# Patient Record
Sex: Male | Born: 1935
Health system: Southern US, Community
[De-identification: ages and names within clinical notes are randomized; demographics above are authoritative.]

## PROBLEM LIST (undated history)

## (undated) DIAGNOSIS — Z8669 Personal history of other diseases of the nervous system and sense organs: Secondary | ICD-10-CM

## (undated) DIAGNOSIS — N400 Enlarged prostate without lower urinary tract symptoms: Secondary | ICD-10-CM

## (undated) DIAGNOSIS — M4310 Spondylolisthesis, site unspecified: Secondary | ICD-10-CM

## (undated) DIAGNOSIS — N529 Male erectile dysfunction, unspecified: Secondary | ICD-10-CM

## (undated) DIAGNOSIS — E039 Hypothyroidism, unspecified: Secondary | ICD-10-CM

## (undated) DIAGNOSIS — I5022 Chronic systolic (congestive) heart failure: Secondary | ICD-10-CM

## (undated) DIAGNOSIS — I712 Thoracic aortic aneurysm, without rupture: Principal | ICD-10-CM

## (undated) DIAGNOSIS — I4819 Other persistent atrial fibrillation: Secondary | ICD-10-CM

## (undated) DIAGNOSIS — I10 Essential (primary) hypertension: Secondary | ICD-10-CM

## (undated) DIAGNOSIS — G4730 Sleep apnea, unspecified: Secondary | ICD-10-CM

## (undated) DIAGNOSIS — I495 Sick sinus syndrome: Secondary | ICD-10-CM

## (undated) DIAGNOSIS — R42 Dizziness and giddiness: Principal | ICD-10-CM

## (undated) DIAGNOSIS — N411 Chronic prostatitis: Secondary | ICD-10-CM

## (undated) HISTORY — DX: Personal history of other diseases of the nervous system and sense organs: Z86.69

## (undated) HISTORY — PX: SHOULDER OPEN ROTATOR CUFF REPAIR: SHX2407

## (undated) HISTORY — DX: Spondylolisthesis, site unspecified: M43.10

## (undated) HISTORY — PX: KNEE ARTHROSCOPY: SHX127

## (undated) HISTORY — DX: Hypothyroidism, unspecified: E03.9

## (undated) HISTORY — PX: BACK SURGERY: SHX140

## (undated) HISTORY — DX: Thoracic aortic aneurysm, without rupture: I71.2

## (undated) HISTORY — DX: Sick sinus syndrome: I49.5

## (undated) HISTORY — PX: FOOT SURGERY: SHX648

## (undated) HISTORY — DX: Dizziness and giddiness: R42

## (undated) HISTORY — PX: OTHER SURGICAL HISTORY: SHX169

## (undated) HISTORY — DX: Sleep apnea, unspecified: G47.30

## (undated) HISTORY — DX: Essential (primary) hypertension: I10

## (undated) HISTORY — DX: Male erectile dysfunction, unspecified: N52.9

## (undated) HISTORY — DX: Other persistent atrial fibrillation: I48.19

## (undated) HISTORY — DX: Benign prostatic hyperplasia without lower urinary tract symptoms: N40.0

## (undated) HISTORY — DX: Chronic prostatitis: N41.1

---

## 1999-04-02 ENCOUNTER — Emergency Department (HOSPITAL_COMMUNITY): Admission: EM | Admit: 1999-04-02 | Discharge: 1999-04-02 | Payer: Self-pay | Admitting: Emergency Medicine

## 1999-04-02 ENCOUNTER — Encounter: Payer: Self-pay | Admitting: Emergency Medicine

## 1999-05-30 HISTORY — PX: CARDIAC CATHETERIZATION: SHX172

## 2000-10-29 HISTORY — PX: ANTERIOR CERVICAL DECOMP/DISCECTOMY FUSION: SHX1161

## 2000-11-04 ENCOUNTER — Encounter: Payer: Self-pay | Admitting: Neurosurgery

## 2000-11-08 ENCOUNTER — Inpatient Hospital Stay (HOSPITAL_COMMUNITY): Admission: RE | Admit: 2000-11-08 | Discharge: 2000-11-09 | Payer: Self-pay | Admitting: Neurosurgery

## 2000-11-08 ENCOUNTER — Encounter: Payer: Self-pay | Admitting: Neurosurgery

## 2000-12-07 ENCOUNTER — Encounter: Admission: RE | Admit: 2000-12-07 | Discharge: 2000-12-07 | Payer: Self-pay | Admitting: Neurosurgery

## 2000-12-07 ENCOUNTER — Encounter: Payer: Self-pay | Admitting: Neurosurgery

## 2001-01-18 ENCOUNTER — Encounter: Payer: Self-pay | Admitting: Neurosurgery

## 2001-01-18 ENCOUNTER — Encounter: Admission: RE | Admit: 2001-01-18 | Discharge: 2001-01-18 | Payer: Self-pay | Admitting: Neurosurgery

## 2003-10-05 ENCOUNTER — Encounter: Admission: RE | Admit: 2003-10-05 | Discharge: 2003-10-05 | Payer: Self-pay | Admitting: Orthopedic Surgery

## 2003-10-30 ENCOUNTER — Ambulatory Visit (HOSPITAL_COMMUNITY): Admission: RE | Admit: 2003-10-30 | Discharge: 2003-10-30 | Payer: Self-pay | Admitting: Orthopedic Surgery

## 2003-10-30 ENCOUNTER — Ambulatory Visit (HOSPITAL_BASED_OUTPATIENT_CLINIC_OR_DEPARTMENT_OTHER): Admission: RE | Admit: 2003-10-30 | Discharge: 2003-10-30 | Payer: Self-pay | Admitting: Orthopedic Surgery

## 2003-10-30 HISTORY — PX: REPAIR PERONEAL TENDONS ANKLE: SUR1201

## 2004-11-21 ENCOUNTER — Ambulatory Visit: Payer: Self-pay | Admitting: Internal Medicine

## 2004-12-17 ENCOUNTER — Encounter: Admission: RE | Admit: 2004-12-17 | Discharge: 2004-12-17 | Payer: Self-pay | Admitting: Internal Medicine

## 2004-12-30 ENCOUNTER — Ambulatory Visit (HOSPITAL_COMMUNITY): Admission: RE | Admit: 2004-12-30 | Discharge: 2004-12-30 | Payer: Self-pay | Admitting: Urology

## 2007-10-20 ENCOUNTER — Ambulatory Visit (HOSPITAL_BASED_OUTPATIENT_CLINIC_OR_DEPARTMENT_OTHER): Admission: RE | Admit: 2007-10-20 | Discharge: 2007-10-21 | Payer: Self-pay | Admitting: Orthopedic Surgery

## 2008-08-11 ENCOUNTER — Encounter: Payer: Self-pay | Admitting: Internal Medicine

## 2009-02-05 ENCOUNTER — Encounter: Admission: RE | Admit: 2009-02-05 | Discharge: 2009-02-05 | Payer: Self-pay | Admitting: Internal Medicine

## 2009-02-08 ENCOUNTER — Telehealth (INDEPENDENT_AMBULATORY_CARE_PROVIDER_SITE_OTHER): Payer: Self-pay | Admitting: *Deleted

## 2009-02-13 ENCOUNTER — Inpatient Hospital Stay (HOSPITAL_BASED_OUTPATIENT_CLINIC_OR_DEPARTMENT_OTHER): Admission: RE | Admit: 2009-02-13 | Discharge: 2009-02-13 | Payer: Self-pay | Admitting: Interventional Cardiology

## 2010-07-29 DIAGNOSIS — M4310 Spondylolisthesis, site unspecified: Secondary | ICD-10-CM

## 2010-07-29 HISTORY — DX: Spondylolisthesis, site unspecified: M43.10

## 2010-09-24 ENCOUNTER — Encounter
Admission: RE | Admit: 2010-09-24 | Discharge: 2010-09-24 | Payer: Self-pay | Source: Home / Self Care | Attending: Internal Medicine | Admitting: Internal Medicine

## 2010-09-28 HISTORY — PX: LUMBAR SPINE SURGERY: SHX701

## 2010-10-18 ENCOUNTER — Other Ambulatory Visit: Payer: Self-pay | Admitting: Interventional Cardiology

## 2010-10-18 DIAGNOSIS — I719 Aortic aneurysm of unspecified site, without rupture: Secondary | ICD-10-CM

## 2010-11-18 ENCOUNTER — Encounter: Payer: Self-pay | Admitting: Internal Medicine

## 2010-12-04 NOTE — Miscellaneous (Signed)
Summary: CPAP Update Info/[facility]  CPAP Update Info/[facility]   Imported By: Sherian Rein 11/25/2010 15:05:58  _____________________________________________________________________  External Attachment:    Type:   Image     Comment:   External Document

## 2011-01-19 ENCOUNTER — Encounter (HOSPITAL_COMMUNITY)
Admission: RE | Admit: 2011-01-19 | Discharge: 2011-01-19 | Disposition: A | Discharge status: Home / Self Care | Payer: Medicare Other | Source: Ambulatory Visit | Attending: Neurosurgery | Admitting: Neurosurgery

## 2011-01-19 LAB — CBC
HCT: 41.4 % (ref 39.0–52.0)
Hemoglobin: 14.1 g/dL (ref 13.0–17.0)
MCH: 31.2 pg (ref 26.0–34.0)
MCHC: 34.1 g/dL (ref 30.0–36.0)
MCV: 91.6 fL (ref 78.0–100.0)
Platelets: 104 10*3/uL — ABNORMAL LOW (ref 150–400)
RBC: 4.52 MIL/uL (ref 4.22–5.81)
RDW: 14.4 % (ref 11.5–15.5)
WBC: 5.5 10*3/uL (ref 4.0–10.5)

## 2011-01-19 LAB — PROTIME-INR
INR: 2.21 — ABNORMAL HIGH (ref 0.00–1.49)
Prothrombin Time: 24.7 seconds — ABNORMAL HIGH (ref 11.6–15.2)

## 2011-01-19 LAB — SURGICAL PCR SCREEN
MRSA, PCR: NEGATIVE
Staphylococcus aureus: NEGATIVE

## 2011-01-19 LAB — BASIC METABOLIC PANEL
BUN: 16 mg/dL (ref 6–23)
CO2: 30 mEq/L (ref 19–32)
Calcium: 9 mg/dL (ref 8.4–10.5)
Chloride: 107 mEq/L (ref 96–112)
Creatinine, Ser: 1.17 mg/dL (ref 0.4–1.5)
GFR calc Af Amer: >60 mL/min (ref >60)
GFR calc non Af Amer: >60 mL/min (ref >60)
Glucose, Bld: 86 mg/dL (ref 70–99)
Potassium: 4.6 mEq/L (ref 3.5–5.1)
Sodium: 141 mEq/L (ref 135–145)

## 2011-01-19 LAB — APTT: aPTT: 38 seconds — ABNORMAL HIGH (ref 24–37)

## 2011-01-26 ENCOUNTER — Inpatient Hospital Stay (HOSPITAL_COMMUNITY): Payer: Medicare Other

## 2011-01-26 ENCOUNTER — Inpatient Hospital Stay (HOSPITAL_COMMUNITY)
Admission: RE | Admit: 2011-01-26 | Discharge: 2011-01-27 | DRG: 460 | Disposition: A | Discharge status: Home / Self Care | Payer: Medicare Other | Source: Ambulatory Visit | Attending: Neurosurgery | Admitting: Neurosurgery

## 2011-01-26 DIAGNOSIS — Z87891 Personal history of nicotine dependence: Secondary | ICD-10-CM

## 2011-01-26 DIAGNOSIS — Q762 Congenital spondylolisthesis: Principal | ICD-10-CM

## 2011-01-26 DIAGNOSIS — I4891 Unspecified atrial fibrillation: Secondary | ICD-10-CM | POA: Diagnosis present

## 2011-01-26 DIAGNOSIS — Z01818 Encounter for other preprocedural examination: Secondary | ICD-10-CM

## 2011-01-26 DIAGNOSIS — Z01812 Encounter for preprocedural laboratory examination: Secondary | ICD-10-CM

## 2011-01-26 DIAGNOSIS — Z01810 Encounter for preprocedural cardiovascular examination: Secondary | ICD-10-CM

## 2011-01-26 DIAGNOSIS — I10 Essential (primary) hypertension: Secondary | ICD-10-CM | POA: Diagnosis present

## 2011-01-26 DIAGNOSIS — G4733 Obstructive sleep apnea (adult) (pediatric): Secondary | ICD-10-CM | POA: Diagnosis present

## 2011-01-26 DIAGNOSIS — Z880 Allergy status to penicillin: Secondary | ICD-10-CM

## 2011-01-26 LAB — TYPE AND SCREEN
ABO/RH(D): A POS
Antibody Screen: NEGATIVE

## 2011-01-26 LAB — PROTIME-INR
INR: 1.09 (ref 0.00–1.49)
Prothrombin Time: 14.3 seconds (ref 11.6–15.2)

## 2011-01-26 LAB — APTT: aPTT: 30 seconds (ref 24–37)

## 2011-01-26 LAB — ABO/RH: ABO/RH(D): A POS

## 2011-01-28 NOTE — Op Note (Signed)
NAMETRIPP, GOINS NO.:  [id number]  MEDICAL RECORD NO.:  [id number]           PATIENT TYPE:  I  LOCATION:  3534                         FACILITY:  [facility]  PHYSICIAN:  Hewitt Shorts, M.D.DATE OF BIRTH:  December 07, 1935  DATE OF PROCEDURE:  01/26/2011 DATE OF DISCHARGE:                              OPERATIVE REPORT   PREOPERATIVE DIAGNOSES: 1. Bilateral L5 pars interarticularis defect. 2. Grade 1 L5-S1 spondylolisthesis. 3. L5-S1 neural foraminal stenosis, left worse than right. 4. Lumbar radiculopathy.  POSTOPERATIVE DIAGNOSES: 1. Bilateral L5 pars interarticularis defect. 2. Grade 1 L5-S1 spondylolisthesis. 3. L5-S1 neural foraminal stenosis, left worse than right. 4. Lumbar radiculopathy.  PROCEDURE:  L5 Gill procedure with complete laminectomy, facetectomy and foraminotomy bilaterally with decompression of the exiting L5 and S1 nerve roots bilaterally with decompression beyond that required for interbody arthrodesis.  Bilateral L5-S1 post lumbar interbody arthrodesis with AVS PEEK interbody implants with Vitoss bone marrow aspirate, INFUSE and locally harvested morselized autograft, and a bilateral L5-S1 posterolateral arthrodesis with radius posterior instrumentation and Vitoss bone marrow aspirate and INFUSE with microdissection.  SURGEON:  Hewitt Shorts, MD  ASSISTANT:  Clydene Fake, MD  ANESTHESIA:  General endotracheal.  INDICATIONS:  The patient is a 75 year old man who presented with low back and left lumbar radicular pain.  He is found to have a grade 1 spondylolisthesis at L5-S1 secondary to bilateral L5 pars interarticularis defect associated with this was severe left L5-S1 neural foraminal stenosis and mild right L5-S1 neural foraminal stenosis, and a decision made was to proceed with decompression and stabilization.  PROCEDURE:  The patient was brought to the operating room and placed under general anesthesia.  The  patient was turned to prone position. Lumbar region was prepped with Betadine soap solution and draped in sterile fashion.  The midline was infiltrated with local anesthetic with epinephrine.  An x-ray was taken and the L4-5 level was identified.  An midline incision was made over the L5-S1 level, carried down through the subcutaneous tissue.  Bipolar cautery and electrocautery were used to maintain hemostasis.  Dissection was carried out down the lumbar fascia which was incised bilaterally.  The paraspinal muscles were dissected from the spinous process and lamina in a subperiosteal fashion.  Self- retaining retractor was placed and x-ray taken.  The L5-S1 interlaminar space was identified.  We then dissected laterally over the hypertrophic facet complexes.  The posterior elements of L5 were noticeably loose, began to open the facet capsule bilaterally at L5-S1 and also free the posterior elements of L5 from the ligamentous attachments, however, in the end we removed the posterior elements in a piecemeal fashion using primarily double-action rongeurs, but also the high-speed drill and Kerrison punches as needed.  There were significant pseudo arthritic buildup, that was carefully removed decompressing the L5 nerve roots bilaterally as it exited into the neural foramen and facetectomy was performed as well to further decompress the neural foramen.  Ligamentum flavum was thickened and carefully removed decompressing the thecal sac. Then we began to examine the epidural space and the annulus of the L5-S1 disk.  We noted  the listhesis of L5 relative to S1.  The annulus was opened, but there was significant spurring of the posterior superior aspect of S1.  This was removed with the high-speed drill and Kerrison punches.  We were able to enter into the disk space, proceed with a thorough diskectomy using variety of pituitary rongeurs and curettes. Microscope was draped and brought to the field  to provide additional navigation, illumination, and visualization as the decompression was performed using microdissection and microsurgical technique. Decompression was carried out laterally into the neural foramen to decompress the disk material ventral to the nerve root.  Once the diskectomy was completed, we prepared the vertebral body endplates with paddle curettes removing the cartilaginous endplate surfaces down to a good bony surface.  We then measured the height of the intervertebral disk space and selected 9-mm in height implants.  We used 20-mm in depth implants.  The C-arm fluoroscope was then draped and brought into the field.  We then proceeded with probing the pedicles of L5 and S1 bilaterally.  Bone marrow aspirate was injected over 10 mL strip of Vitoss.  We packed the pedicle holes with Gelfoam with thrombin and then went ahead and packed the PEEK implants with combination of INFUSE and Vitoss bone marrow aspirate.  We then placed the first implant on the right side.  We retracted the thecal sac and nerve root medially.  It was countersunk. We then went to the left side, packed the midline with INFUSE and locally harvested morselized autograft, and then once good amount of bone was packed in the midline, we again retracted the thecal sac and nerve root medially and gently tamped the second PEEK implant into the interbody space and it was countersunk.  The C-arm fluoroscope confirmed good positioning of the interbody implants.  We then went ahead and tapped each of the pedicles with 5.25-mm tap, examined the ball probe, good bony surface was noted, good threading was noted and then we placed a 5.75 x 55 mm screws bilaterally at L5 and 5.75 x 40 mm screws bilaterally at S1.  We then selected 30-mm rods and placed within the screw heads.  Locking caps were placed and then once all 4 locking caps were placed final tightening was performed against a counter torque.  We then  packed lateral gutter over the transverse process of L5 and the ala of S1 in the intertransverse space with INFUSE and Vitoss bone marrow aspirate.  There wound was irrigated with numerous times of the procedure with saline solution and bacitracin solution.  Good hemostasis was confirmed, and then once hemostasis was completed, we proceeded with closure.  Paraspinal muscle approximated with interrupted undyed 1 Vicryl sutures.  Deep fascia was closed with interrupted undyed 1 Vicryl sutures.  Scarpa fascia was closed with interrupted undyed 1 Vicryl sutures.  The subcutaneous and the subcuticular were closed with inverted 2-0 Vicryl sutures.  Skin was approximated with Dermabond.  The wound was dressed with sterile gauze and 4-inch Hypafix.  The procedure was tolerated well.  Estimated blood loss was 1100 mL.  We were able to return 550 mL of Cell Saver blood to the patient.  Sponge and needle count correct.  Following surgery, the patient was turned back to supine position to be reversed from the anesthetic, extubated, and transferred to the recovery room for further care.     Hewitt Shorts, M.D.     RWN/MEDQ  D:  01/26/2011  T:  01/27/2011  Job:  [id number]  Electronically Signed by Shirlean Kelly M.D. on 01/28/2011 01:42:31 PM

## 2011-02-10 NOTE — Op Note (Signed)
Don Jacobs, Don NO.:  [id number]   MEDICAL RECORD NO.:  [id number]          PATIENT TYPE:  AMB   LOCATION:  DSC                          FACILITY:  [facility]   PHYSICIAN:  Dionne Ano. Gramig III, M.D.DATE OF BIRTH:  [birth year]   DATE OF PROCEDURE:  10/20/2007  DATE OF DISCHARGE:                               OPERATIVE REPORT   PREOPERATIVE DIAGNOSIS:  Massive right shoulder rotator cuff tear with  impingement symptoms and a hypertrophic degenerative distal clavicle as  well as biceps fraying.   POSTOPERATIVE DIAGNOSIS:  Massive right shoulder rotator cuff tear with  impingement symptoms and a hypertrophic degenerative distal clavicle as  well as biceps fraying.   PROCEDURE:  1. Right shoulder rotator cuff repair, massive, with a suture anchor      and push-lock fixation (double row repair).  This was a massive      tear about the infra-and-supraspinatus tendon.  2. Subacromial decompression and bursectomy, right shoulder.  3. Distal clavicle resection, right shoulder.  4. Biceps tenotomy, right shoulder.   SURGEON:  Dionne Ano. Amanda Pea, M.D.   ASSISTANT:  Karie Chimera, P.A.-C.   ANESTHESIA:  General.  Preoperative axillary block.   TOURNIQUET TIME:  Zero.   ESTIMATED BLOOD LOSS:  Less than 150 mL.   COMPLICATIONS:  None.   INDICATIONS FOR THE PROCEDURE:  This patient is a 75 year old male who  has findings of a massive acute-on-chronic rotator cuff tear.  I have  discussed the risks and benefits of surgery.  He has no humeral head  coverage and a significant abnormality in retraction of the cuff up.  Due to this, I have recommended surgery, as described above, hoping that  we will be able to achieve a meaningful repair.  Given the massive  nature and chronic changes, I would recommend an open approach.  He  understands this, the risks and benefits including bleeding, infection,  damage to normal structures, and failure of surgery to accomplish  its  intended goals with relied of symptoms and restoring function.  With  this in mind, we will proceed.   OPERATION:  The patient was placed under suitable anesthesia taken to  the operative suite.  Permit was signed, he was counseled in the holding  area, and arm was marked.  A time-out was called.  He was then and given  a general anesthetic, placed in the beach-chair position, appropriately  padded, and prepped and draped in the usual sterile fashion about the  right upper extremity.  Once a sterile field was secured, I then  performed an evaluation under anesthesia which showed no instability,  but positive crepitus.   I then made an anterolateral approach.  Dissection was carried down to  the deltoid musculature which was incised about the anterior and middle  raphe.  I carried the dissection down to the rotator cuff, incised the  bursal tissue, and removed a large amount of synovial joint  fluid/effusion; following this, I performed an extensive bursectomy.  An  extensive bursectomy was performed without difficulty.  Following this,  I performed a subacromial decompression  with a bur and filed this down  nicely.  I took great care to preserve the coracoacromial arch as the  patient has a very massive tear, and I did not want him to have any  humeral escape if this repair does not go on to complete healing.   Following decompression of the subacromial space and bursectomy, I  turned attention towards the distal clavicle.  The patient had a very  hypertrophic and degenerative distal clavicle.  Periosteal elevation  about the bony surfaces was accomplished, and the distal clavicle was  excised approximately 1 cm in nature.  Bone wax was placed, and spurs  cleaned up nicely in this region; and following this, the capsule was  closed over it with #0 Vicryl.  Once this was done, I turned attention  back towards the shoulder.  The biceps tendon was very degenerative and  irregular,  and I performed a biceps tenotomy just off of the glenoid.  I  resected a portion of this, and allowed this tendon to retract.  The  patient did not have any complicating features with this.   Once this was done, I then performed very careful and cautious  mobilization of the rotator cuff.  He had a massive tear and I mobilized  him with a combination of fingertip glove pressure, Cobb elevator use  above-and-below the planes, and was very meticulous and careful in doing  so.  I then cleaned up the rotator cuff edges, and noted that I could  advance him fairly readily.  At this time, I then created a bony trough  and medialized the rotator cuff insertion, given some of the chronic  changes on the cuff.  I wanted to make sure that he had maximal chance  of healing, and thus medialized him appropriately.  Following this, 3  Arthrex suture anchors of the 5/5 variety were placed, and 12 sutures  were allowed to exit from this region.  I then placed three 2-0  FiberWire sutures and closed a longitudinal tear.  These sutures exited  superiorly I then placed the 12 sutures through the rotator cuff, and  held tension on the cuff after it was mobilized.  The sutures were  placed well medially of the cuff edge.   Following this the shoulder was irrigated copiously as it was during  multiple points during the procedure, and the sutures were tied down.  The rotator cuff advanced nicely, and I then placed a #2 FiberWire in  the rotator cuff interval to close this area.  This all closed very  nicely.  Following this 3 swivel push locks were placed and 18 exiting  sutures were placed through the 3 swivel locks, for a pants-over-vest  technique (double row fixation).  The patient tolerated this quite well,  and there were no complicating features.  The rotator cuff was inserted  nicely; and there was nice coverage, although somewhat medialized.   I would give him a fair prognosis given the rotator cuff  tissue quality.  Certainly the patient had elements of chronic tearing as well as the  acute tear that was noted.  The humeral head was completely uncovered  and cuff was well retracted.  Fortunately we were able to mobilize this,  but I will plan for a very slow rehab.   Following this the deltoid interval was closed with #0 Vicryl.  I should  note that the deltoid was not taken off of the acromion, and the  coracoacromial ligament was  left intact.  The skin was closed with  Vicryl, and the skin edge was closed with subcuticular staples.  A  sterile dressing was applied, and he was placed on a shoulder abduction  pillow and taken to recovery room.  The patient tolerated this well.   We are going to plan for a passive range of motion only 0-6 weeks, some  active-assisted range of motion at approximately 7-8 weeks, and no  active range of motion until the patient is 12 weeks postop.  We will  proceed with his therapy scheduling, after he sees [place] back in the office  in 10-14 days.  He was given preoperative vancomycin, and will be  continued on this postoperatively.           ______________________________  Dionne Ano. Everlene Other, M.D.     Nash Mantis  D:  10/20/2007  T:  10/21/2007  Job:  [id number]

## 2011-02-10 NOTE — Cardiovascular Report (Signed)
NAMEDOMINIC, MAHANEY NO.:  [id number]   MEDICAL RECORD NO.:  [id number]          PATIENT TYPE:  OIB   LOCATION:  1962                         FACILITY:  [facility]   PHYSICIAN:  Corky Crafts, MDDATE OF BIRTH:  1935-11-15   DATE OF PROCEDURE:  02/13/2009  DATE OF DISCHARGE:  02/13/2009                            CARDIAC CATHETERIZATION   REFERRING PHYSICIAN:  Candyce Churn, MD   REASON FOR CATHETERIZATION:  Chest discomfort.   PROCEDURES PERFORMED:  1. Left heart catheterization.  2. Left ventriculogram.  3. Coronary angiogram.  4. Abdominal aortogram.   OPERATOR:  Corky Crafts, MD   PROCEDURE NARRATIVE:  The risks and benefits of cardiac catheterization  were explained to the patient and informed consent was obtained.  He was  brought to the cath lab.  He was prepped and draped in the usual sterile  fashion.  His right groin was infiltrated with 1% lidocaine.  A 4-French  sheath was placed into the right femoral artery using the modified  Seldinger technique.  Left coronary artery angiography was initially  tried with a JR4 and then JR5 catheter, but these catheters were too  short.  Subsequently, a JL6 was used successfully to engage the left  main.  Digital angiography was performed in multiple projections using  hand injection of contrast.  Right coronary artery angiography was  performed using a JR4.0 catheter.  The catheter was advanced to the  vessel ostium under fluoroscopic guidance.  Digital angiography was  performed in multiple projections using hand injection of contrast.  A  pigtail catheter was advanced to the ascending aorta and across the  aortic valve.  Power injection of contrast was performed in the RAO  projection to image the left ventricle.  The catheter was pulled back  under continuous hemodynamic pressure monitoring.  The catheter was then  withdrawn to the abdominal aorta and a power injection of contrast was  performed in the AP projection to image the abdominal aorta.  The sheath  was removed using manual compression.   FINDINGS:  The left main was widely patent.  The left circumflex was a  large vessel proximally.  The OM1 was a large branching vessel, which  was angiographically normal.  The remainder of the circumflex was widely  patent.   The left anterior descending was a large vessel proximally.  The first  diagonal was medium sized, the mid to distal LAD was small, but widely  patent.   The right coronary artery was a large dominant vessel with mild luminal  irregularities.  The posterolateral artery was small.  The posterior  descending artery was medium sized without any significant stenosis.   The left ventriculogram showed normal left ventricular function with an  estimated ejection fraction of 55-60%.  The aortic root did appear  dilated.   HEMODYNAMICS:  Left ventricular pressure 133/15 with an LVEDP of 18  mmHg.  Aortic pressure 129/67 with a mean aortic pressure of 92.   The abdominal aortogram shows no abdominal aortic aneurysm.  There is  mild atherosclerosis.  The left kidney is  supplied by a single renal  artery, which appears widely patent.  There may be dual arterial supply  to the right kidney.  Both branches appear widely patent.   IMPRESSION:  1. No hemodynamically significant coronary artery disease.  2. Normal ventricular function.  3. Slightly elevated left ventricular end diastole pressure with no      aortic valve gradient.  4. No abdominal aortic aneurysm or renal artery stenosis.   RECOMMENDATIONS:  Continue preventive therapy and risk factor  modifications.  I will follow up with him in the office in about a week.      Corky Crafts, MD  Electronically Signed     JSV/MEDQ  D:  02/13/2009  T:  02/13/2009  Job:  [id number]
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
## 2011-02-13 NOTE — Op Note (Signed)
Don Jacobs, Don Jacobs NO.:  [id number]   MEDICAL RECORD NO.:  [id number]                   PATIENT TYPE:  AMB   LOCATION:  DSC                                  FACILITY:  [facility]   PHYSICIAN:  Leonides Grills, M.D.                  DATE OF BIRTH:  07/21/1936   DATE OF PROCEDURE:  10/30/2003  DATE OF DISCHARGE:                                 OPERATIVE REPORT   PREOPERATIVE DIAGNOSIS:  1. Right peroneus brevis tear.  2. Right subluxing peroneal tendons.  3. Nonunion right distal lateral malleolus.  4. Right peroneal tenosynovitis.   POSTOPERATIVE DIAGNOSIS:  1. Right peroneus brevis tear.  2. Right subluxing peroneal tendons.  3. Nonunion right distal lateral malleolus.  4. Right peroneal tenosynovitis.   OPERATION PERFORMED:  1. Repair of right peroneus brevis tendon.  2. Repair of right subluxing peroneal tendon.  3. Tenosynovectomy, right peroneal tendon.  4. Partial excision, right lateral malleolus.   SURGEON:  Leonides Grills, M.D.   ASSISTANT:  Lianne Cure, P.A.   ANESTHESIA:  Popliteal block and sedation.   ESTIMATED BLOOD LOSS:  Minimal.   TOURNIQUET TIME:  Approximately one hour.   COMPLICATIONS:  None.   DISPOSITION:  Stable to PR.   INDICATIONS FOR PROCEDURE:  The patient is a 75 year old male who has had  persistent longstanding posterolateral ankle pain that is interfering with  his life to the point that he can not do what he wants to do.  The patient  has consented for the above procedure.  All risks which include infection,  neurovascular injury, persistent pain, worsening pain, stiffness, arthritis,  possible future surgery and instability were all explained, questions were  encouraged and answered.   DESCRIPTION OF PROCEDURE:  The patient was brought to the operating room and  placed in supine position after adequate popliteal block and sedation was  administered as well as Vancomycin 500 mg IV piggyback.  The patient  was  then placed in a lateral decubitus position with the operative side up on a  bean bag with axillary roll in place.  All bony prominences were well  padded.  The right lower extremity was prepped and draped in sterile manner  over a proximally placed thigh tourniquet.  Limb was gravity exsanguinated.  Tourniquet was elevated 290 mmHg.  A curvilinear incision was made.  Dissection was carried down through skin.  Hemostasis was obtained.  Peroneal retinaculum was then identified and was opened approximately 1 to 2  mm just posterior to the posterolateral edge of the lateral malleolus.  There was a large amount of synovitis in this area.  Meticulous  tenosynovectomy was then performed.  There were numerous gouty tophus type  material and plaques on the tendon itself when inspecting the tendons.  Also  when inspecting the tendons, there was an approximately 3 cm long tear  within the brevis tendon.  This was then repaired with 6-0 nylon suture in a  cannulated fashion.  Once this was repaired, we then found that the tip of  the lateral malleolus was loose and actually had a nonunited avulsion  fragment off the tip of the lateral malleolus. This was then meticulously  dissected out and removed.  We then created a trough in the posterolateral  corner of the lateral malleolus and using the standard technique, placed two  5.0 corkscrew absorbable suture anchors in this bed. We then advanced the  peroneal retinaculum into this groove respectively with #2 FiberWire and the  remaining portion of the retinaculum was repaired with #2-0 FiberWire  protecting the tendons with a Freer.  As the retinaculum was closed, and  area was closed, this was copiously irrigated with normal saline in layers.  Tourniquet was deflated and hemostasis was obtained. Subcutaneous was closed  with 3-0 Vicryl.  The skin was closed with 4-0 nylon.  Sterile dressing was  applied.  Modified Jones dressing was applied with  the ankle in neutral  dorsiflexion.  The patient was stable to the PR.                                               Leonides Grills, M.D.    PB/MEDQ  D:  10/30/2003  T:  10/30/2003  Job:  [id number]

## 2011-03-09 ENCOUNTER — Encounter: Payer: Self-pay | Admitting: Internal Medicine

## 2011-03-12 ENCOUNTER — Ambulatory Visit (INDEPENDENT_AMBULATORY_CARE_PROVIDER_SITE_OTHER): Payer: Medicare Other | Admitting: Internal Medicine

## 2011-03-12 ENCOUNTER — Encounter: Payer: Self-pay | Admitting: Internal Medicine

## 2011-03-12 VITALS — BP 114/78 | HR 67 | Ht 69.0 in | Wt 244.2 lb

## 2011-03-12 DIAGNOSIS — G4733 Obstructive sleep apnea (adult) (pediatric): Secondary | ICD-10-CM | POA: Insufficient documentation

## 2011-03-12 DIAGNOSIS — G4734 Idiopathic sleep related nonobstructive alveolar hypoventilation: Secondary | ICD-10-CM | POA: Insufficient documentation

## 2011-03-12 NOTE — Assessment & Plan Note (Addendum)
He was aware of some snore through and daytime tirednes on CPAP 8.5 even before his surgery. We will try empiric increase to 10. He may need a new sleep study. Probably he was relaxed by the postoperative sedation, aggravating palatal collapse at that time.

## 2011-03-12 NOTE — Patient Instructions (Signed)
Orders- [facility]- [facility]- increase CPAP to 10 cwp                  Dx OSA                          [facility]- overnight oximetry on CPAP 10/ room air           Dx hypoxemia with sleep
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
## 2011-03-12 NOTE — Progress Notes (Signed)
  Subjective:    Patient ID: Don Jacobs, male    DOB: Jan 08, 1936, 75 y.o.   MRN: [id number]  HPI 03/12/11- 29 yoM seen for Dr Newell Coral because of hypoxia and sleep apnea.  Diagnosed obstructive sleep apnea with NPSG 8//2/88 RDI/ AHI  30.6/hr. Last here in 2006. He had done well on CPAP 8.5/ [facility], but had gained weight and was waking tired recently. Had back surgery January 26, 2011. After surgery his oxygen saturation fell despite CPAP and he was given supplemental O2.  He quit smoking 30 years ago and denies hx of lung disease. Has an incidental cold this week, but denies routine cough or wheeze. Remote broken nose, no ENT surgery, Doesn't think he snores though his mask.  Atrial Fib dx;d January, 2012- now on coumadin. Treated hypothyroidism.    Review of Systems Constitutional:   No weight loss, night sweats,  Fevers, chills, fatigue, lassitude. HEENT:   No headaches,  Difficulty swallowing,  Tooth/dental problems,  Sore throat,                No sneezing, itching, ear ache, nasal congestion, post nasal drip,   CV:  No chest pain,  Orthopnea, PND, swelling in lower extremities, anasarca, dizziness, palpitations  GI  No heartburn, indigestion, abdominal pain, nausea, vomiting, diarrhea, change in bowel habits, loss of appetite  Resp: No shortness of breath with exertion or at rest.  No excess mucus, no productive cough,  No non-productive cough,  No coughing up of blood.  No change in color of mucus.  No wheezing.    Skin: no rash or lesions.  GU: no dysuria, change in color of urine, no urgency or frequency.  No flank pain.  MS:  No joint pain or swelling.  No decreased range of motion.  No back pain.  Psych:  No change in mood or affect. No depression or anxiety.  No memory loss.      Objective:   Physical Exam General- Alert, Oriented, Affect-appropriate, Distress- none acute  overweight  Skin- rash-none, lesions- none, excoriation- none  Lymphadenopathy- none  Head-  atraumatic  Eyes- Gross vision intact, PERRLA, conjunctivae clear, secretions  Ears- Hearing, canals, Tm - normal  Nose- Clear, No-Septal dev, mucus, polyps, erosion, perforation   Husky voice  Throat- Mallampati IV , mucosa clear , drainage- none, tonsils- atrophic  Neck- flexible , trachea midline, no stridor , thyroid nl, carotid no bruit  Chest - symmetrical excursion , unlabored     Heart/CV- Slightly irregular , no murmur , no gallop  , no rub, nl s1 s2                     - JVD- none , edema- none, stasis changes- none, varices- none     Lung- clear to P&A, wheeze- none, cough- none , dullness-none, rub- none     Chest wall-   Abd- tender-no, distended-no, bowel sounds-present, HSM- no  Br/ Gen/ Rectal- Not done, not indicated  Extrem- cyanosis- none, clubbing, none, atrophy- none, strength- nl  Neuro- grossly intact to observation         Assessment & Plan:

## 2011-03-15 ENCOUNTER — Encounter: Payer: Self-pay | Admitting: Internal Medicine

## 2011-03-19 ENCOUNTER — Encounter: Payer: Self-pay | Admitting: Internal Medicine

## 2011-04-06 ENCOUNTER — Ambulatory Visit
Admission: RE | Admit: 2011-04-06 | Discharge: 2011-04-06 | Disposition: A | Discharge status: Home / Self Care | Payer: Medicare Other | Source: Ambulatory Visit | Attending: Interventional Cardiology | Admitting: Interventional Cardiology

## 2011-04-06 DIAGNOSIS — I719 Aortic aneurysm of unspecified site, without rupture: Secondary | ICD-10-CM

## 2011-04-14 ENCOUNTER — Ambulatory Visit (INDEPENDENT_AMBULATORY_CARE_PROVIDER_SITE_OTHER): Payer: Medicare Other | Admitting: Internal Medicine

## 2011-04-14 ENCOUNTER — Encounter: Payer: Self-pay | Admitting: Internal Medicine

## 2011-04-14 VITALS — BP 116/64 | HR 65 | Ht 70.0 in | Wt 248.0 lb

## 2011-04-14 DIAGNOSIS — G4734 Idiopathic sleep related nonobstructive alveolar hypoventilation: Secondary | ICD-10-CM

## 2011-04-14 DIAGNOSIS — G4733 Obstructive sleep apnea (adult) (pediatric): Secondary | ICD-10-CM

## 2011-04-14 NOTE — Assessment & Plan Note (Addendum)
We discussed interaction between OSA, oxygenation and his heart rhythm. We may need to repeat an ONOX after CPAP is increased to 12.

## 2011-04-14 NOTE — Assessment & Plan Note (Signed)
We will increase CPAP to 12 for trial. Consider autotitration.

## 2011-04-14 NOTE — Progress Notes (Signed)
Subjective:    Patient ID: Don Jacobs, male    DOB: 1936-02-09, 75 y.o.   MRN: [id number]  HPI    Review of Systems     Objective:   Physical Exam        Assessment & Plan:   Subjective:    Patient ID: Don Jacobs, male    DOB: [birth year], 75 y.o.   MRN: [id number]  HPI 03/12/11- 40 yoM seen for Dr Newell Coral because of hypoxia and sleep apnea.  Diagnosed obstructive sleep apnea with NPSG 8//2/88 RDI/ AHI  30.6/hr. Last here in 2006. He had done well on CPAP 8.5/ [facility], but had gained weight and was waking tired recently. Had back surgery January 26, 2011. After surgery his oxygen saturation fell despite CPAP and he was given supplemental O2.  He quit smoking 30 years ago and denies hx of lung disease. Has an incidental cold this week, but denies routine cough or wheeze. Remote broken nose, no ENT surgery, Doesn't think he snores though his mask.  Atrial Fib dx;d January, 2012- now on coumadin. Treated hypothyroidism.   04/14/11-  46 yoM former smoker seen for Dr Newell Coral because of hypoxia and sleep apnea.  Continues CPAP all night every night as he has for years and can't sleep without it. He thinks the pressure change up to 10 ([facility])  has helped.  ONOX on RA/CPAP 03/29/11 recorded 12 minutes with sat <88%, which I reviewed with him. He felt better after the increase to 10. We agreed that rather than adding an O2 concentrator, we would see how he does with one more pressure increase.,  >> CPAP 12  Review of Systems Constitutional:   No weight loss, night sweats,  Fevers, chills, fatigue, lassitude. HEENT:   No headaches,  Difficulty swallowing,  Tooth/dental problems,  Sore throat,                No sneezing, itching, ear ache, nasal congestion, post nasal drip,   CV:  No chest pain,  Orthopnea, PND, swelling in lower extremities, anasarca, dizziness, palpitations  GI  No heartburn, indigestion, abdominal pain, nausea, vomiting, diarrhea, change in bowel habits, loss  of appetite  Resp: No shortness of breath with exertion or at rest.  No excess mucus, no productive cough,  No non-productive cough,  No coughing up of blood.  No change in color of mucus.  No wheezing.    Skin: no rash or lesions.  GU: no dysuria, change in color of urine, no urgency or frequency.  No flank pain.  MS:  No joint pain or swelling.  No decreased range of motion.  No back pain.  Psych:  No change in mood or affect. No depression or anxiety.  No memory loss.      Objective:   Physical Exam General- Alert, Oriented, Affect-appropriate, Distress- none acute  overweight  Skin- rash-none, lesions- none, excoriation- none  Lymphadenopathy- none  Head- atraumatic  Eyes- Gross vision intact, PERRLA, conjunctivae clear, secretions  Ears- Hearing, canals, Tm - normal  Nose- Clear, No-Septal dev, mucus, polyps, erosion, perforation   Husky voice  Throat- Mallampati IV , mucosa clear , drainage- none, tonsils- atrophic  Neck- flexible , trachea midline, no stridor , thyroid nl, carotid no bruit  Chest - symmetrical excursion , unlabored     Heart/CV- Slightly irregular , no murmur , no gallop  , no rub, nl s1 s2                     -  JVD- none , edema- none, stasis changes- none, varices- none     Lung- clear to P&A, wheeze- none, cough- none , dullness-none, rub- none     Chest wall-   Abd- tender-no, distended-no, bowel sounds-present, HSM- no  Br/ Gen/ Rectal- Not done, not indicated  Extrem- cyanosis- none, clubbing, none, atrophy- none, strength- nl  Neuro- grossly intact to observation         Assessment & Plan:

## 2011-04-14 NOTE — Patient Instructions (Signed)
Order- [facility]- [facility]- increase CPAP to 12 cwp        Dx OSA    Please call me if this is not a comfortable pressure change.

## 2011-04-21 ENCOUNTER — Other Ambulatory Visit: Payer: Self-pay | Admitting: Interventional Cardiology

## 2011-04-21 DIAGNOSIS — I712 Thoracic aortic aneurysm, without rupture: Secondary | ICD-10-CM

## 2011-04-22 ENCOUNTER — Inpatient Hospital Stay: Admission: RE | Admit: 2011-04-22 | Payer: Medicare Other | Source: Ambulatory Visit

## 2011-04-22 ENCOUNTER — Ambulatory Visit
Admission: RE | Admit: 2011-04-22 | Discharge: 2011-04-22 | Disposition: A | Discharge status: Home / Self Care | Payer: Medicare Other | Source: Ambulatory Visit | Attending: Interventional Cardiology | Admitting: Interventional Cardiology

## 2011-04-22 ENCOUNTER — Encounter: Payer: Self-pay | Admitting: Internal Medicine

## 2011-04-22 DIAGNOSIS — I712 Thoracic aortic aneurysm, without rupture: Secondary | ICD-10-CM

## 2011-04-22 MED ORDER — GADOBENATE DIMEGLUMINE 529 MG/ML IV SOLN
20.0000 mL | Freq: Once | INTRAVENOUS | Status: AC | PRN
  Administered 2011-04-22: 20 mL via INTRAVENOUS

## 2011-04-24 ENCOUNTER — Telehealth: Payer: Self-pay | Admitting: Internal Medicine

## 2011-04-24 DIAGNOSIS — G4733 Obstructive sleep apnea (adult) (pediatric): Secondary | ICD-10-CM

## 2011-04-27 ENCOUNTER — Other Ambulatory Visit: Payer: Self-pay | Admitting: Internal Medicine

## 2011-04-27 NOTE — Telephone Encounter (Signed)
Per CY, ok to decrease pressure down to 10cwp   Called and spoke with pt. Pt aware of order to decrease pressure.  Order sent to [facility].

## 2011-04-27 NOTE — Telephone Encounter (Signed)
Pt states that dr young told him to call back if the 12 was to high on his cpap and pt states that it is and would like to go back down to 10 the setting of 12 is giving him bad headaches, please send order if cy ok with this.

## 2011-05-19 ENCOUNTER — Encounter: Payer: Medicare Other | Admitting: Surgery

## 2011-05-25 DIAGNOSIS — L821 Other seborrheic keratosis: Secondary | ICD-10-CM | POA: Insufficient documentation

## 2011-05-25 DIAGNOSIS — N411 Chronic prostatitis: Secondary | ICD-10-CM

## 2011-05-25 DIAGNOSIS — E039 Hypothyroidism, unspecified: Secondary | ICD-10-CM

## 2011-05-25 DIAGNOSIS — I712 Thoracic aortic aneurysm, without rupture, unspecified: Secondary | ICD-10-CM

## 2011-05-25 DIAGNOSIS — M109 Gout, unspecified: Secondary | ICD-10-CM | POA: Insufficient documentation

## 2011-05-25 DIAGNOSIS — I10 Essential (primary) hypertension: Secondary | ICD-10-CM

## 2011-05-25 DIAGNOSIS — I4891 Unspecified atrial fibrillation: Secondary | ICD-10-CM

## 2011-05-25 DIAGNOSIS — N400 Enlarged prostate without lower urinary tract symptoms: Secondary | ICD-10-CM

## 2011-05-25 DIAGNOSIS — M4310 Spondylolisthesis, site unspecified: Secondary | ICD-10-CM

## 2011-05-25 DIAGNOSIS — N529 Male erectile dysfunction, unspecified: Secondary | ICD-10-CM

## 2011-05-25 DIAGNOSIS — G4730 Sleep apnea, unspecified: Secondary | ICD-10-CM

## 2011-05-25 DIAGNOSIS — J302 Other seasonal allergic rhinitis: Secondary | ICD-10-CM | POA: Insufficient documentation

## 2011-05-25 DIAGNOSIS — H9190 Unspecified hearing loss, unspecified ear: Secondary | ICD-10-CM | POA: Insufficient documentation

## 2011-05-25 HISTORY — DX: Thoracic aortic aneurysm, without rupture, unspecified: I71.20

## 2011-05-25 HISTORY — DX: Thoracic aortic aneurysm, without rupture: I71.2

## 2011-05-26 ENCOUNTER — Encounter: Payer: Self-pay | Admitting: Surgery

## 2011-05-26 ENCOUNTER — Encounter: Payer: Medicare Other | Admitting: Surgery

## 2011-05-26 ENCOUNTER — Institutional Professional Consult (permissible substitution) (INDEPENDENT_AMBULATORY_CARE_PROVIDER_SITE_OTHER): Payer: Medicare Other | Admitting: Surgery

## 2011-05-26 VITALS — BP 119/68 | HR 55 | Temp 98.4°F | Resp 16 | Ht 69.0 in | Wt 240.0 lb

## 2011-05-26 DIAGNOSIS — I712 Thoracic aortic aneurysm, without rupture, unspecified: Secondary | ICD-10-CM

## 2011-05-26 NOTE — H&P (Signed)
Don Jacobs is an 75 y.o. male.   Chief Complaint: Aortic root and proximal ascending aortic aneurysm HPI: He is a 75 year old gentleman who has been followed by Dr. Everette Rank and underwent cardiac catheterization in May of 2010 after presenting with worsening shortness of breath. The catheterization showed no obstructive coronary disease. Left ventricular function was normal. In retrospect he feels his shortness of breath was probably due to inadequate pressure on his CPAP machine. Since this has been adjusted his shortness of breath has resolved. He subsequently developed atrial fibrillation in January 2012 and was treated with metoprolol and Coumadin. He said that he was called by Dr. Hoyle Barr office recently and asked to get an MR angiogram of the chest to evaluate an aortic aneurysm. He is not sure how this diagnosis of aneurysm was initially made but it may have been by echocardiogram. I do not have those records in my office. The MR angiogram showed a 4.6-4.7 cm aortic root and proximal ascending aortic aneurysm.   Past Medical History  Diagnosis Date  . Hypertension   . A-fib   . Sleep apnea   . Gout   . Hypothyroidism   . Allergic rhinitis   . ED (erectile dysfunction)   . BPH (benign prostatic hyperplasia)   . Hearing loss     uses hearing a cyst  . Hx of migraines   . Prostatitis, chronic   . Spondylolisthesis 07/2010  . Seborrheic keratosis   . Aortic aneurysm, thoracic 05/25/2011    Past Surgical History  Procedure Date  . Back surgery 2012 6 weeks ago  . L5 selective nerve root block     Dr Ethelene Hal  . Spine surgery     multilevel cervical disk surgery 10/2000, lumbosacral spine surgery in 09/2010  . Rotator cuff repair 09/2007    right side  . Repair peroneal tendons ankle 10/2003    right side    Family History  Problem Relation Age of Onset  . Heart disease Father    Social History:  reports that he quit smoking about 30 years ago. His smoking use included  Cigarettes. He has a 12 pack-year smoking history. He has never used smokeless tobacco. He reports that he does not drink alcohol or use illicit drugs.  Allergies:  Allergies  Allergen Reactions  . Penicillins Shortness Of Breath and Rash    Ended up at ER after using    Medications Prior to Admission  Medication Sig Dispense Refill  . allopurinol (ZYLOPRIM) 300 MG tablet Take 300 mg by mouth daily.        Marland Kitchen aspirin 81 MG tablet Take 81 mg by mouth daily.        . Calcium Carbonate (CALTRATE 600 PO) Take 1 capsule by mouth daily.        . Cholecalciferol (VITAMIN D3) 10000 UNITS capsule Take 10,000 Units by mouth daily.        Marland Kitchen doxazosin (CARDURA) 8 MG tablet Take 8 mg by mouth at bedtime.        Marland Kitchen levothyroxine (SYNTHROID, LEVOTHROID) 112 MCG tablet Take 112 mcg by mouth daily.        . metoprolol tartrate (LOPRESSOR) 25 MG tablet Take 25 mg by mouth 2 (two) times daily.        . predniSONE (DELTASONE) 10 MG tablet Take as directed as needed       . warfarin (COUMADIN) 5 MG tablet Take 5 mg by mouth daily.        Marland Kitchen  saw palmetto 160 MG capsule Take 160 mg by mouth daily.         No current facility-administered medications on file as of 05/26/2011.    No results found for this or any previous visit (from the past 48 hour(s)). @[id number] @  Review of Systems  Constitutional: Negative.   HENT: Negative.   Eyes: Negative.   Respiratory: Negative.   Cardiovascular: Negative.   Gastrointestinal: Negative.   Genitourinary: Negative.   Musculoskeletal: Positive for joint pain.  Skin: Negative.   Neurological: Negative.   Endo/Heme/Allergies: Negative.   Psychiatric/Behavioral: Negative.     Blood pressure 119/68, pulse 55, temperature 98.4 F (36.9 C), temperature source Oral, resp. rate 16, height 5\' 9"  (1.753 m), weight 240 lb (108.863 kg), SpO2 96.00%. Physical Exam  Constitutional: He is oriented to person, place, and time. He appears well-developed and well-nourished.    HENT:  Head: Normocephalic and atraumatic.  Nose: Nose normal.  Mouth/Throat: Oropharynx is clear and moist.  Eyes: Conjunctivae and EOM are normal. Pupils are equal, round, and reactive to light.  Neck: Neck supple. No JVD present. No tracheal deviation present. No thyromegaly present.       Scar left neck from cervical spine surgery  Cardiovascular: Normal rate, regular rhythm, normal heart sounds and intact distal pulses.  Exam reveals no gallop and no friction rub.   No murmur heard. Respiratory: Effort normal and breath sounds normal.  GI: Soft. Bowel sounds are normal. He exhibits no mass. There is no tenderness.       obese  Musculoskeletal: He exhibits no edema and no tenderness.  Lymphadenopathy:    He has no cervical adenopathy.  Neurological: He is alert and oriented to person, place, and time. He has normal strength. No cranial nerve deficit or sensory deficit.  Skin: Skin is warm.  Psychiatric: He has a normal mood and affect. His behavior is normal. Judgment and thought content normal.     Assessment/Plan He has a 4.6-4.7 cm fusiform aortic root and ascending aortic aneurysm. It is unclear how long this has been present since this is less than 5.5 cm I recommended that we continue to follow it for now. We'll plan to repeat his MR angiogram the chest in one year he'll see him back at that time. He is already on a beta blocker.  Alleen Borne 05/26/2011, 4:54 PM

## 2011-06-18 LAB — BASIC METABOLIC PANEL
BUN: 12
GFR calc non Af Amer: >60
Potassium: 4.8

## 2011-06-18 LAB — POCT HEMOGLOBIN-HEMACUE: Hemoglobin: 15.5

## 2011-08-18 ENCOUNTER — Ambulatory Visit: Payer: Medicare Other | Admitting: Internal Medicine

## 2011-10-05 DIAGNOSIS — M25519 Pain in unspecified shoulder: Secondary | ICD-10-CM | POA: Diagnosis not present

## 2011-10-21 DIAGNOSIS — I4891 Unspecified atrial fibrillation: Secondary | ICD-10-CM | POA: Diagnosis not present

## 2011-10-21 DIAGNOSIS — Z7901 Long term (current) use of anticoagulants: Secondary | ICD-10-CM | POA: Diagnosis not present

## 2011-11-02 DIAGNOSIS — M25519 Pain in unspecified shoulder: Secondary | ICD-10-CM | POA: Diagnosis not present

## 2011-11-25 DIAGNOSIS — I4891 Unspecified atrial fibrillation: Secondary | ICD-10-CM | POA: Diagnosis not present

## 2011-11-25 DIAGNOSIS — Z7901 Long term (current) use of anticoagulants: Secondary | ICD-10-CM | POA: Diagnosis not present

## 2011-12-04 DIAGNOSIS — I4891 Unspecified atrial fibrillation: Secondary | ICD-10-CM | POA: Diagnosis not present

## 2011-12-04 DIAGNOSIS — Z7901 Long term (current) use of anticoagulants: Secondary | ICD-10-CM | POA: Diagnosis not present

## 2011-12-10 DIAGNOSIS — N401 Enlarged prostate with lower urinary tract symptoms: Secondary | ICD-10-CM | POA: Diagnosis not present

## 2011-12-10 DIAGNOSIS — N200 Calculus of kidney: Secondary | ICD-10-CM | POA: Diagnosis not present

## 2011-12-10 DIAGNOSIS — R109 Unspecified abdominal pain: Secondary | ICD-10-CM | POA: Diagnosis not present

## 2011-12-10 DIAGNOSIS — N529 Male erectile dysfunction, unspecified: Secondary | ICD-10-CM | POA: Diagnosis not present

## 2011-12-28 DIAGNOSIS — Z7901 Long term (current) use of anticoagulants: Secondary | ICD-10-CM | POA: Diagnosis not present

## 2011-12-28 DIAGNOSIS — I4891 Unspecified atrial fibrillation: Secondary | ICD-10-CM | POA: Diagnosis not present

## 2012-01-04 DIAGNOSIS — I4891 Unspecified atrial fibrillation: Secondary | ICD-10-CM | POA: Diagnosis not present

## 2012-01-04 DIAGNOSIS — A088 Other specified intestinal infections: Secondary | ICD-10-CM | POA: Diagnosis not present

## 2012-01-04 DIAGNOSIS — Z7901 Long term (current) use of anticoagulants: Secondary | ICD-10-CM | POA: Diagnosis not present

## 2012-01-11 DIAGNOSIS — I4891 Unspecified atrial fibrillation: Secondary | ICD-10-CM | POA: Diagnosis not present

## 2012-01-11 DIAGNOSIS — Z7901 Long term (current) use of anticoagulants: Secondary | ICD-10-CM | POA: Diagnosis not present

## 2012-01-11 DIAGNOSIS — I712 Thoracic aortic aneurysm, without rupture: Secondary | ICD-10-CM | POA: Diagnosis not present

## 2012-01-11 DIAGNOSIS — E781 Pure hyperglyceridemia: Secondary | ICD-10-CM | POA: Diagnosis not present

## 2012-01-26 DIAGNOSIS — Z7901 Long term (current) use of anticoagulants: Secondary | ICD-10-CM | POA: Diagnosis not present

## 2012-01-26 DIAGNOSIS — I4891 Unspecified atrial fibrillation: Secondary | ICD-10-CM | POA: Diagnosis not present

## 2012-02-19 DIAGNOSIS — M25519 Pain in unspecified shoulder: Secondary | ICD-10-CM | POA: Diagnosis not present

## 2012-02-23 DIAGNOSIS — I4891 Unspecified atrial fibrillation: Secondary | ICD-10-CM | POA: Diagnosis not present

## 2012-02-23 DIAGNOSIS — Z7901 Long term (current) use of anticoagulants: Secondary | ICD-10-CM | POA: Diagnosis not present

## 2012-03-08 DIAGNOSIS — Z7901 Long term (current) use of anticoagulants: Secondary | ICD-10-CM | POA: Diagnosis not present

## 2012-03-08 DIAGNOSIS — I4891 Unspecified atrial fibrillation: Secondary | ICD-10-CM | POA: Diagnosis not present

## 2012-03-24 DIAGNOSIS — Z7901 Long term (current) use of anticoagulants: Secondary | ICD-10-CM | POA: Diagnosis not present

## 2012-03-24 DIAGNOSIS — I4891 Unspecified atrial fibrillation: Secondary | ICD-10-CM | POA: Diagnosis not present

## 2012-03-25 DIAGNOSIS — M25519 Pain in unspecified shoulder: Secondary | ICD-10-CM | POA: Diagnosis not present

## 2012-04-07 DIAGNOSIS — Z7901 Long term (current) use of anticoagulants: Secondary | ICD-10-CM | POA: Diagnosis not present

## 2012-04-07 DIAGNOSIS — I4891 Unspecified atrial fibrillation: Secondary | ICD-10-CM | POA: Diagnosis not present

## 2012-04-13 DIAGNOSIS — M25519 Pain in unspecified shoulder: Secondary | ICD-10-CM | POA: Diagnosis not present

## 2012-04-20 DIAGNOSIS — M25519 Pain in unspecified shoulder: Secondary | ICD-10-CM | POA: Diagnosis not present

## 2012-04-20 DIAGNOSIS — M75120 Complete rotator cuff tear or rupture of unspecified shoulder, not specified as traumatic: Secondary | ICD-10-CM | POA: Diagnosis not present

## 2012-05-04 ENCOUNTER — Other Ambulatory Visit: Payer: Self-pay | Admitting: Surgery

## 2012-05-04 DIAGNOSIS — I712 Thoracic aortic aneurysm, without rupture: Secondary | ICD-10-CM

## 2012-05-05 DIAGNOSIS — I4891 Unspecified atrial fibrillation: Secondary | ICD-10-CM | POA: Diagnosis not present

## 2012-05-05 DIAGNOSIS — Z7901 Long term (current) use of anticoagulants: Secondary | ICD-10-CM | POA: Diagnosis not present

## 2012-05-11 DIAGNOSIS — Z01818 Encounter for other preprocedural examination: Secondary | ICD-10-CM | POA: Diagnosis not present

## 2012-05-20 DIAGNOSIS — Z7901 Long term (current) use of anticoagulants: Secondary | ICD-10-CM | POA: Diagnosis not present

## 2012-05-20 DIAGNOSIS — I4891 Unspecified atrial fibrillation: Secondary | ICD-10-CM | POA: Diagnosis not present

## 2012-05-27 ENCOUNTER — Ambulatory Visit
Admission: RE | Admit: 2012-05-27 | Discharge: 2012-05-27 | Disposition: A | Discharge status: Home / Self Care | Payer: Medicare Other | Source: Ambulatory Visit | Attending: Surgery | Admitting: Surgery

## 2012-05-27 DIAGNOSIS — I712 Thoracic aortic aneurysm, without rupture: Secondary | ICD-10-CM | POA: Diagnosis not present

## 2012-05-27 MED ORDER — GADOBENATE DIMEGLUMINE 529 MG/ML IV SOLN
20.0000 mL | Freq: Once | INTRAVENOUS | Status: AC | PRN
  Administered 2012-05-27: 20 mL via INTRAVENOUS

## 2012-05-31 ENCOUNTER — Ambulatory Visit (INDEPENDENT_AMBULATORY_CARE_PROVIDER_SITE_OTHER): Payer: Medicare Other | Admitting: Surgery

## 2012-05-31 ENCOUNTER — Encounter: Payer: Self-pay | Admitting: Surgery

## 2012-05-31 VITALS — BP 129/73 | HR 46 | Resp 16 | Ht 69.0 in | Wt 235.0 lb

## 2012-05-31 DIAGNOSIS — Z09 Encounter for follow-up examination after completed treatment for conditions other than malignant neoplasm: Secondary | ICD-10-CM

## 2012-05-31 DIAGNOSIS — I712 Thoracic aortic aneurysm, without rupture, unspecified: Secondary | ICD-10-CM

## 2012-05-31 DIAGNOSIS — S43429A Sprain of unspecified rotator cuff capsule, initial encounter: Secondary | ICD-10-CM

## 2012-05-31 DIAGNOSIS — M75102 Unspecified rotator cuff tear or rupture of left shoulder, not specified as traumatic: Secondary | ICD-10-CM | POA: Insufficient documentation

## 2012-05-31 NOTE — Progress Notes (Signed)
[address]            [place] [address]          [phone]     HPI:  HPI: He is a 76 year old gentleman who has been followed by Dr. Everette Rank and underwent cardiac catheterization in May of 2010 after presenting with worsening shortness of breath. The catheterization showed no obstructive coronary disease. Left ventricular function was normal. An MR angiogram of the aorta at that time showed a 4.6-4.7 cm ascending aortic aneurysm. This was below the threshold for recommending surgery and we decided to repeat the scan in 1 year. Over the past year he said he has continued to feel well. He denies any chest pain or shortness of breath. He has suffered a left rotator cuff tear and is scheduled for surgery in a few weeks.   Current Outpatient Prescriptions  Medication Sig Dispense Refill  . allopurinol (ZYLOPRIM) 300 MG tablet Take 300 mg by mouth daily.        . Calcium Carbonate (CALTRATE 600 PO) Take 1 capsule by mouth daily.        . Cholecalciferol (VITAMIN D3) 10000 UNITS capsule Take 10,000 Units by mouth daily.        . colchicine 0.6 MG tablet Take 0.6 mg by mouth daily. Only takes prn.       . doxazosin (CARDURA) 8 MG tablet Take 8 mg by mouth at bedtime.        Marland Kitchen levothyroxine (SYNTHROID, LEVOTHROID) 112 MCG tablet Take 112 mcg by mouth daily.        . metoprolol tartrate (LOPRESSOR) 25 MG tablet Take 25 mg by mouth 2 (two) times daily.        . predniSONE (DELTASONE) 10 MG tablet Take as directed as needed       . warfarin (COUMADIN) 5 MG tablet Take 5 mg by mouth daily.           Physical Exam: BP 129/73  Pulse 46  Resp 16  Ht 5\' 9"  (1.753 m)  Wt 235 lb (106.595 kg)  BMI 34.70 kg/m2  SpO2 96% He looks well. Cardiac exam shows regular rate and rhythm with normal heart sounds. There is no murmur, rub, or gallop. Lung exam is clear.  Diagnostic Tests:  *RADIOLOGY REPORT*   Clinical Data: Ascending aortic aneurysm   MRA CHEST  WITH OR WITHOUT CONTRAST   Contrast: 20mL MULTIHANCE GADOBENATE DIMEGLUMINE 529 MG/ML IV SOLN BUN and creatinine were obtained on site at [facility] at [address] Results:  BUN 20 mg/dL,  Creatinine 9.14 mg/dL.   Comparison: 04/22/2011   Findings: There is mild fusiform dilatation of the ascending aorta. Aorta measures 4 cm at the sinotubular junction, 4.7 cm proximal ascending, 4.4 cm distal ascending/proximal arch, 3.4 cm distal arch, 3.1 cm proximal descending, 2.9 cm distal descending just above the diaphragm.  No evidence of dissection or intramural hematoma.  No significant atheromatous irregularity.  There is classic three-vessel brachiocephalic arterial origin anatomy, mildly tortuous without stenosis.  No pleural or pericardial effusion.  No hilar or mediastinal adenopathy is evident. Visualized portions of upper abdomen are unremarkable.   IMPRESSION:   1.  Stable 4.7 cm ascending aortic aneurysm without complicating features.     Original Report Authenticated By: Osa Craver, M.D.     Impression:  He has a  stable 4.7 cm ascending aortic aneurysm. His blood pressure is under control and he is on a beta blocker.  Plan:  I will plan to see him back in one year and we'll repeat his MR angiogram of the chest at that time.

## 2012-06-02 DIAGNOSIS — N401 Enlarged prostate with lower urinary tract symptoms: Secondary | ICD-10-CM | POA: Diagnosis not present

## 2012-06-09 DIAGNOSIS — R361 Hematospermia: Secondary | ICD-10-CM | POA: Diagnosis not present

## 2012-06-09 DIAGNOSIS — N139 Obstructive and reflux uropathy, unspecified: Secondary | ICD-10-CM | POA: Diagnosis not present

## 2012-06-09 DIAGNOSIS — N401 Enlarged prostate with lower urinary tract symptoms: Secondary | ICD-10-CM | POA: Diagnosis not present

## 2012-06-09 DIAGNOSIS — N200 Calculus of kidney: Secondary | ICD-10-CM | POA: Diagnosis not present

## 2012-06-21 DIAGNOSIS — Z7901 Long term (current) use of anticoagulants: Secondary | ICD-10-CM | POA: Diagnosis not present

## 2012-06-21 DIAGNOSIS — I4891 Unspecified atrial fibrillation: Secondary | ICD-10-CM | POA: Diagnosis not present

## 2012-06-28 DIAGNOSIS — M7520 Bicipital tendinitis, unspecified shoulder: Secondary | ICD-10-CM | POA: Diagnosis not present

## 2012-06-28 DIAGNOSIS — M719 Bursopathy, unspecified: Secondary | ICD-10-CM | POA: Diagnosis not present

## 2012-06-28 DIAGNOSIS — M67919 Unspecified disorder of synovium and tendon, unspecified shoulder: Secondary | ICD-10-CM | POA: Diagnosis not present

## 2012-06-28 DIAGNOSIS — M19019 Primary osteoarthritis, unspecified shoulder: Secondary | ICD-10-CM | POA: Diagnosis not present

## 2012-06-28 DIAGNOSIS — M75100 Unspecified rotator cuff tear or rupture of unspecified shoulder, not specified as traumatic: Secondary | ICD-10-CM | POA: Diagnosis not present

## 2012-06-28 DIAGNOSIS — M75110 Incomplete rotator cuff tear or rupture of unspecified shoulder, not specified as traumatic: Secondary | ICD-10-CM | POA: Diagnosis not present

## 2012-07-06 DIAGNOSIS — Z7901 Long term (current) use of anticoagulants: Secondary | ICD-10-CM | POA: Diagnosis not present

## 2012-07-06 DIAGNOSIS — M75110 Incomplete rotator cuff tear or rupture of unspecified shoulder, not specified as traumatic: Secondary | ICD-10-CM | POA: Diagnosis not present

## 2012-07-06 DIAGNOSIS — I4891 Unspecified atrial fibrillation: Secondary | ICD-10-CM | POA: Diagnosis not present

## 2012-07-15 DIAGNOSIS — I4891 Unspecified atrial fibrillation: Secondary | ICD-10-CM | POA: Diagnosis not present

## 2012-07-15 DIAGNOSIS — Z7901 Long term (current) use of anticoagulants: Secondary | ICD-10-CM | POA: Diagnosis not present

## 2012-07-19 DIAGNOSIS — M25519 Pain in unspecified shoulder: Secondary | ICD-10-CM | POA: Diagnosis not present

## 2012-07-19 DIAGNOSIS — M67919 Unspecified disorder of synovium and tendon, unspecified shoulder: Secondary | ICD-10-CM | POA: Diagnosis not present

## 2012-07-21 DIAGNOSIS — M25519 Pain in unspecified shoulder: Secondary | ICD-10-CM | POA: Diagnosis not present

## 2012-07-21 DIAGNOSIS — M719 Bursopathy, unspecified: Secondary | ICD-10-CM | POA: Diagnosis not present

## 2012-07-25 DIAGNOSIS — M67919 Unspecified disorder of synovium and tendon, unspecified shoulder: Secondary | ICD-10-CM | POA: Diagnosis not present

## 2012-07-25 DIAGNOSIS — M25519 Pain in unspecified shoulder: Secondary | ICD-10-CM | POA: Diagnosis not present

## 2012-07-27 DIAGNOSIS — M67919 Unspecified disorder of synovium and tendon, unspecified shoulder: Secondary | ICD-10-CM | POA: Diagnosis not present

## 2012-07-27 DIAGNOSIS — M25519 Pain in unspecified shoulder: Secondary | ICD-10-CM | POA: Diagnosis not present

## 2012-07-27 DIAGNOSIS — M719 Bursopathy, unspecified: Secondary | ICD-10-CM | POA: Diagnosis not present

## 2012-08-01 DIAGNOSIS — M67919 Unspecified disorder of synovium and tendon, unspecified shoulder: Secondary | ICD-10-CM | POA: Diagnosis not present

## 2012-08-01 DIAGNOSIS — M25519 Pain in unspecified shoulder: Secondary | ICD-10-CM | POA: Diagnosis not present

## 2012-08-02 DIAGNOSIS — E559 Vitamin D deficiency, unspecified: Secondary | ICD-10-CM | POA: Diagnosis not present

## 2012-08-02 DIAGNOSIS — N400 Enlarged prostate without lower urinary tract symptoms: Secondary | ICD-10-CM | POA: Diagnosis not present

## 2012-08-02 DIAGNOSIS — Z79899 Other long term (current) drug therapy: Secondary | ICD-10-CM | POA: Diagnosis not present

## 2012-08-02 DIAGNOSIS — I4891 Unspecified atrial fibrillation: Secondary | ICD-10-CM | POA: Diagnosis not present

## 2012-08-02 DIAGNOSIS — E039 Hypothyroidism, unspecified: Secondary | ICD-10-CM | POA: Diagnosis not present

## 2012-08-02 DIAGNOSIS — I10 Essential (primary) hypertension: Secondary | ICD-10-CM | POA: Diagnosis not present

## 2012-08-02 DIAGNOSIS — M109 Gout, unspecified: Secondary | ICD-10-CM | POA: Diagnosis not present

## 2012-08-02 DIAGNOSIS — Z0000 Encounter for general adult medical examination without abnormal findings: Secondary | ICD-10-CM | POA: Diagnosis not present

## 2012-08-02 DIAGNOSIS — Z7901 Long term (current) use of anticoagulants: Secondary | ICD-10-CM | POA: Diagnosis not present

## 2012-08-02 DIAGNOSIS — G4730 Sleep apnea, unspecified: Secondary | ICD-10-CM | POA: Diagnosis not present

## 2012-08-02 DIAGNOSIS — Z1331 Encounter for screening for depression: Secondary | ICD-10-CM | POA: Diagnosis not present

## 2012-08-03 DIAGNOSIS — M25519 Pain in unspecified shoulder: Secondary | ICD-10-CM | POA: Diagnosis not present

## 2012-08-03 DIAGNOSIS — M719 Bursopathy, unspecified: Secondary | ICD-10-CM | POA: Diagnosis not present

## 2012-08-03 DIAGNOSIS — M67919 Unspecified disorder of synovium and tendon, unspecified shoulder: Secondary | ICD-10-CM | POA: Diagnosis not present

## 2012-08-05 DIAGNOSIS — M67919 Unspecified disorder of synovium and tendon, unspecified shoulder: Secondary | ICD-10-CM | POA: Diagnosis not present

## 2012-08-05 DIAGNOSIS — M719 Bursopathy, unspecified: Secondary | ICD-10-CM | POA: Diagnosis not present

## 2012-08-05 DIAGNOSIS — M25519 Pain in unspecified shoulder: Secondary | ICD-10-CM | POA: Diagnosis not present

## 2012-08-08 DIAGNOSIS — M67919 Unspecified disorder of synovium and tendon, unspecified shoulder: Secondary | ICD-10-CM | POA: Diagnosis not present

## 2012-08-08 DIAGNOSIS — M25519 Pain in unspecified shoulder: Secondary | ICD-10-CM | POA: Diagnosis not present

## 2012-08-08 DIAGNOSIS — M719 Bursopathy, unspecified: Secondary | ICD-10-CM | POA: Diagnosis not present

## 2012-08-10 DIAGNOSIS — M65849 Other synovitis and tenosynovitis, unspecified hand: Secondary | ICD-10-CM | POA: Diagnosis not present

## 2012-08-10 DIAGNOSIS — M65839 Other synovitis and tenosynovitis, unspecified forearm: Secondary | ICD-10-CM | POA: Diagnosis not present

## 2012-08-12 DIAGNOSIS — M25519 Pain in unspecified shoulder: Secondary | ICD-10-CM | POA: Diagnosis not present

## 2012-08-12 DIAGNOSIS — M719 Bursopathy, unspecified: Secondary | ICD-10-CM | POA: Diagnosis not present

## 2012-08-12 DIAGNOSIS — M67919 Unspecified disorder of synovium and tendon, unspecified shoulder: Secondary | ICD-10-CM | POA: Diagnosis not present

## 2012-08-17 DIAGNOSIS — M25519 Pain in unspecified shoulder: Secondary | ICD-10-CM | POA: Diagnosis not present

## 2012-08-17 DIAGNOSIS — M719 Bursopathy, unspecified: Secondary | ICD-10-CM | POA: Diagnosis not present

## 2012-08-19 DIAGNOSIS — M67919 Unspecified disorder of synovium and tendon, unspecified shoulder: Secondary | ICD-10-CM | POA: Diagnosis not present

## 2012-08-19 DIAGNOSIS — M25519 Pain in unspecified shoulder: Secondary | ICD-10-CM | POA: Diagnosis not present

## 2012-08-26 DIAGNOSIS — M719 Bursopathy, unspecified: Secondary | ICD-10-CM | POA: Diagnosis not present

## 2012-08-26 DIAGNOSIS — M25519 Pain in unspecified shoulder: Secondary | ICD-10-CM | POA: Diagnosis not present

## 2012-08-30 DIAGNOSIS — I4891 Unspecified atrial fibrillation: Secondary | ICD-10-CM | POA: Diagnosis not present

## 2012-08-30 DIAGNOSIS — Z7901 Long term (current) use of anticoagulants: Secondary | ICD-10-CM | POA: Diagnosis not present

## 2012-08-31 DIAGNOSIS — M719 Bursopathy, unspecified: Secondary | ICD-10-CM | POA: Diagnosis not present

## 2012-08-31 DIAGNOSIS — M25519 Pain in unspecified shoulder: Secondary | ICD-10-CM | POA: Diagnosis not present

## 2012-08-31 DIAGNOSIS — M67919 Unspecified disorder of synovium and tendon, unspecified shoulder: Secondary | ICD-10-CM | POA: Diagnosis not present

## 2012-09-05 DIAGNOSIS — M25519 Pain in unspecified shoulder: Secondary | ICD-10-CM | POA: Diagnosis not present

## 2012-09-05 DIAGNOSIS — M67919 Unspecified disorder of synovium and tendon, unspecified shoulder: Secondary | ICD-10-CM | POA: Diagnosis not present

## 2012-09-07 DIAGNOSIS — M25519 Pain in unspecified shoulder: Secondary | ICD-10-CM | POA: Diagnosis not present

## 2012-09-07 DIAGNOSIS — M67919 Unspecified disorder of synovium and tendon, unspecified shoulder: Secondary | ICD-10-CM | POA: Diagnosis not present

## 2012-09-13 DIAGNOSIS — M719 Bursopathy, unspecified: Secondary | ICD-10-CM | POA: Diagnosis not present

## 2012-09-13 DIAGNOSIS — M25519 Pain in unspecified shoulder: Secondary | ICD-10-CM | POA: Diagnosis not present

## 2012-09-15 DIAGNOSIS — M719 Bursopathy, unspecified: Secondary | ICD-10-CM | POA: Diagnosis not present

## 2012-09-15 DIAGNOSIS — M25519 Pain in unspecified shoulder: Secondary | ICD-10-CM | POA: Diagnosis not present

## 2012-09-15 DIAGNOSIS — M67919 Unspecified disorder of synovium and tendon, unspecified shoulder: Secondary | ICD-10-CM | POA: Diagnosis not present

## 2012-09-20 DIAGNOSIS — M25519 Pain in unspecified shoulder: Secondary | ICD-10-CM | POA: Diagnosis not present

## 2012-09-20 DIAGNOSIS — M719 Bursopathy, unspecified: Secondary | ICD-10-CM | POA: Diagnosis not present

## 2012-09-22 DIAGNOSIS — M67919 Unspecified disorder of synovium and tendon, unspecified shoulder: Secondary | ICD-10-CM | POA: Diagnosis not present

## 2012-09-22 DIAGNOSIS — M719 Bursopathy, unspecified: Secondary | ICD-10-CM | POA: Diagnosis not present

## 2012-09-22 DIAGNOSIS — M25519 Pain in unspecified shoulder: Secondary | ICD-10-CM | POA: Diagnosis not present

## 2012-09-26 DIAGNOSIS — M67919 Unspecified disorder of synovium and tendon, unspecified shoulder: Secondary | ICD-10-CM | POA: Diagnosis not present

## 2012-09-26 DIAGNOSIS — M25519 Pain in unspecified shoulder: Secondary | ICD-10-CM | POA: Diagnosis not present

## 2012-09-26 DIAGNOSIS — M719 Bursopathy, unspecified: Secondary | ICD-10-CM | POA: Diagnosis not present

## 2012-10-05 DIAGNOSIS — I4891 Unspecified atrial fibrillation: Secondary | ICD-10-CM | POA: Diagnosis not present

## 2012-10-05 DIAGNOSIS — Z7901 Long term (current) use of anticoagulants: Secondary | ICD-10-CM | POA: Diagnosis not present

## 2012-10-06 DIAGNOSIS — I712 Thoracic aortic aneurysm, without rupture: Secondary | ICD-10-CM | POA: Diagnosis not present

## 2012-10-06 DIAGNOSIS — R002 Palpitations: Secondary | ICD-10-CM | POA: Diagnosis not present

## 2012-10-06 DIAGNOSIS — I10 Essential (primary) hypertension: Secondary | ICD-10-CM | POA: Diagnosis not present

## 2012-10-06 DIAGNOSIS — I4891 Unspecified atrial fibrillation: Secondary | ICD-10-CM | POA: Diagnosis not present

## 2012-10-31 DIAGNOSIS — Z7901 Long term (current) use of anticoagulants: Secondary | ICD-10-CM | POA: Diagnosis not present

## 2012-10-31 DIAGNOSIS — I4891 Unspecified atrial fibrillation: Secondary | ICD-10-CM | POA: Diagnosis not present

## 2012-11-02 ENCOUNTER — Encounter (HOSPITAL_COMMUNITY): Payer: Self-pay | Admitting: Emergency Medicine

## 2012-11-02 ENCOUNTER — Emergency Department (HOSPITAL_COMMUNITY)
Admission: EM | Admit: 2012-11-02 | Discharge: 2012-11-02 | Disposition: A | Discharge status: Home / Self Care | Payer: Medicare Other | Attending: Emergency Medicine | Admitting: Emergency Medicine

## 2012-11-02 ENCOUNTER — Emergency Department (HOSPITAL_COMMUNITY): Payer: Medicare Other

## 2012-11-02 DIAGNOSIS — Z872 Personal history of diseases of the skin and subcutaneous tissue: Secondary | ICD-10-CM | POA: Diagnosis not present

## 2012-11-02 DIAGNOSIS — E079 Disorder of thyroid, unspecified: Secondary | ICD-10-CM | POA: Diagnosis not present

## 2012-11-02 DIAGNOSIS — Z7901 Long term (current) use of anticoagulants: Secondary | ICD-10-CM | POA: Diagnosis not present

## 2012-11-02 DIAGNOSIS — H9190 Unspecified hearing loss, unspecified ear: Secondary | ICD-10-CM | POA: Diagnosis not present

## 2012-11-02 DIAGNOSIS — Z9109 Other allergy status, other than to drugs and biological substances: Secondary | ICD-10-CM | POA: Insufficient documentation

## 2012-11-02 DIAGNOSIS — Z87891 Personal history of nicotine dependence: Secondary | ICD-10-CM | POA: Insufficient documentation

## 2012-11-02 DIAGNOSIS — M109 Gout, unspecified: Secondary | ICD-10-CM | POA: Diagnosis not present

## 2012-11-02 DIAGNOSIS — I4891 Unspecified atrial fibrillation: Secondary | ICD-10-CM | POA: Diagnosis not present

## 2012-11-02 DIAGNOSIS — Z8679 Personal history of other diseases of the circulatory system: Secondary | ICD-10-CM | POA: Diagnosis not present

## 2012-11-02 DIAGNOSIS — Z79899 Other long term (current) drug therapy: Secondary | ICD-10-CM | POA: Diagnosis not present

## 2012-11-02 DIAGNOSIS — R0602 Shortness of breath: Secondary | ICD-10-CM | POA: Diagnosis not present

## 2012-11-02 DIAGNOSIS — Q762 Congenital spondylolisthesis: Secondary | ICD-10-CM | POA: Insufficient documentation

## 2012-11-02 DIAGNOSIS — N419 Inflammatory disease of prostate, unspecified: Secondary | ICD-10-CM | POA: Insufficient documentation

## 2012-11-02 DIAGNOSIS — Z87898 Personal history of other specified conditions: Secondary | ICD-10-CM | POA: Diagnosis not present

## 2012-11-02 DIAGNOSIS — G4730 Sleep apnea, unspecified: Secondary | ICD-10-CM | POA: Diagnosis not present

## 2012-11-02 DIAGNOSIS — I10 Essential (primary) hypertension: Secondary | ICD-10-CM | POA: Diagnosis not present

## 2012-11-02 LAB — PRO B NATRIURETIC PEPTIDE: Pro B Natriuretic peptide (BNP): 125.8 pg/mL — ABNORMAL HIGH (ref 0–125)

## 2012-11-02 LAB — CBC WITH DIFFERENTIAL/PLATELET
Basophils Absolute: 0.1 10*3/uL (ref 0.0–0.1)
Basophils Relative: 1 % (ref 0–1)
Hemoglobin: 15.2 g/dL (ref 13.0–17.0)
MCHC: 34.3 g/dL (ref 30.0–36.0)
Monocytes Relative: 9 % (ref 3–12)
Neutro Abs: 3.5 10*3/uL (ref 1.7–7.7)
Neutrophils Relative %: 65 % (ref 43–77)
RDW: 14.8 % (ref 11.5–15.5)

## 2012-11-02 LAB — POCT I-STAT TROPONIN I: Troponin i, poc: 0 ng/mL (ref 0.00–0.08)

## 2012-11-02 LAB — COMPREHENSIVE METABOLIC PANEL
ALT: 26 U/L (ref 0–53)
AST: 25 U/L (ref 0–37)
Albumin: 3.8 g/dL (ref 3.5–5.2)
Alkaline Phosphatase: 53 U/L (ref 39–117)
Chloride: 104 mEq/L (ref 96–112)
Potassium: 4.1 mEq/L (ref 3.5–5.1)
Sodium: 141 mEq/L (ref 135–145)
Total Bilirubin: 1 mg/dL (ref 0.3–1.2)

## 2012-11-02 LAB — URINALYSIS, ROUTINE W REFLEX MICROSCOPIC
Glucose, UA: NEGATIVE mg/dL
Hgb urine dipstick: NEGATIVE
Ketones, ur: NEGATIVE mg/dL
Protein, ur: NEGATIVE mg/dL

## 2012-11-02 LAB — APTT: aPTT: 41 seconds — ABNORMAL HIGH (ref 24–37)

## 2012-11-02 LAB — PROTIME-INR: Prothrombin Time: 22.1 seconds — ABNORMAL HIGH (ref 11.6–15.2)

## 2012-11-02 MED ORDER — SODIUM CHLORIDE 0.9 % IV SOLN: INTRAVENOUS | Status: DC

## 2012-11-02 NOTE — ED Provider Notes (Signed)
History     CSN: [id number]  Arrival date & time 11/02/12  0800   First MD Initiated Contact with Patient 11/02/12 [phone]      Chief Complaint  Patient presents with  . Shortness of Breath    (Consider location/radiation/quality/duration/timing/severity/associated sxs/prior treatment) Patient is a 77 y.o. male presenting with shortness of breath. The history is provided by the patient, the spouse and medical records. No language interpreter was used.  Shortness of Breath  The current episode started today. Episode frequency: Patient cup around 5:30 AM with shortness of breath. He noted irregular heartbeat. He has known atrial fibrillation. The problem has been gradually improving. The problem is moderate. Nothing relieves the symptoms. Nothing aggravates the symptoms. Associated symptoms include shortness of breath. Pertinent negatives include no chest pain and no fever. He has not inhaled smoke recently. Steroid use: He uses prednisone if he gets an attack of gout. He has had prior hospitalizations. He has had no prior ICU admissions. He has had no prior intubations. He has received no recent medical care.    Past Medical History  Diagnosis Date  . Hypertension   . A-fib   . Sleep apnea   . Gout   . Hypothyroidism   . Allergic rhinitis   . ED (erectile dysfunction)   . BPH (benign prostatic hyperplasia)   . Hearing loss     uses hearing a cyst  . Hx of migraines   . Prostatitis, chronic   . Spondylolisthesis 07/2010  . Seborrheic keratosis   . Aortic aneurysm, thoracic 05/25/2011    Past Surgical History  Procedure Date  . Back surgery 2012 6 weeks ago  . L5 selective nerve root block     Dr Ethelene Hal  . Spine surgery     multilevel cervical disk surgery 10/2000, lumbosacral spine surgery in 09/2010  . Rotator cuff repair 09/2007    right side  . Repair peroneal tendons ankle 10/2003    right side    Family History  Problem Relation Age of Onset  . Heart disease Father      History  Substance Use Topics  . Smoking status: Former Smoker -- 1.0 packs/day for 12 years    Types: Cigarettes    Quit date: 03/11/1981  . Smokeless tobacco: Never Used  . Alcohol Use: No      Review of Systems  Constitutional: Negative for fever and chills.  HENT: Negative.        He has deafness.  Eyes: Negative.   Respiratory: Positive for shortness of breath.   Cardiovascular: Negative for chest pain.  Gastrointestinal: Negative.   Genitourinary: Negative.   Musculoskeletal: Negative.   Skin: Negative.   Neurological: Negative.   Psychiatric/Behavioral: Negative.     Allergies  Penicillins  Home Medications   Current Outpatient Rx  Name  Route  Sig  Dispense  Refill  . ALLOPURINOL 300 MG PO TABS   Oral   Take 300 mg by mouth daily.           Marland Kitchen CALTRATE 600 PO   Oral   Take 1 capsule by mouth daily.           Marland Kitchen VITAMIN D3 10000 UNITS PO CAPS   Oral   Take 10,000 Units by mouth daily.           . COLCHICINE 0.6 MG PO TABS   Oral   Take 0.6 mg by mouth daily. Only takes prn.          Marland Kitchen  DOXAZOSIN MESYLATE 8 MG PO TABS   Oral   Take 8 mg by mouth at bedtime.           Marland Kitchen LEVOTHYROXINE SODIUM 112 MCG PO TABS   Oral   Take 112 mcg by mouth daily.           Marland Kitchen METOPROLOL TARTRATE 25 MG PO TABS   Oral   Take 25 mg by mouth 2 (two) times daily.           Marland Kitchen PREDNISONE 10 MG PO TABS      Take as directed as needed          . WARFARIN SODIUM 5 MG PO TABS   Oral   Take 5 mg by mouth daily.             There were no vitals taken for this visit.  Physical Exam  Nursing note and vitals reviewed. Constitutional: He is oriented to person, place, and time. He appears well-developed and well-nourished.       Hirsute elderly man in no distress.  HENT:  Head: Normocephalic and atraumatic.  Mouth/Throat: Oropharynx is clear and moist.       Hearing aids in both ears.  Eyes: Conjunctivae normal and EOM are normal. Pupils are equal,  round, and reactive to light.  Neck: Normal range of motion. Neck supple.  Cardiovascular: Normal rate and normal heart sounds.        irregular rhythm.  Pulmonary/Chest: Effort normal and breath sounds normal.  Abdominal: Soft. Bowel sounds are normal.  Musculoskeletal: Normal range of motion. He exhibits no edema and no tenderness.  Neurological: He is alert and oriented to person, place, and time.       No sensory or motor deficit.  Skin: Skin is warm and dry.  Psychiatric: He has a normal mood and affect. His behavior is normal.    ED Course  Procedures (including critical care time)  8:27 AM  Date: 11/02/2012  Rate: 107  Rhythm: atrial fibrillation  QRS Axis: left  Intervals: normal  ST/T Wave abnormalities: normal  Conduction Disutrbances:none  Narrative Interpretation: Abnormal EKG  Old EKG Reviewed: changes noted--was in sinus rhythm on tracing of 01/19/2011.  8:50 AM Patient was seen and had physical examination. Laboratory tests were ordered. Old charts were reviewed.   10:15 AM Results for orders placed during the hospital encounter of 11/02/12  CBC WITH DIFFERENTIAL      Component Value Range   WBC 5.4  4.0 - 10.5 K/uL   RBC 4.93  4.22 - 5.81 MIL/uL   Hemoglobin 15.2  13.0 - 17.0 g/dL   HCT 40.9  81.1 - 91.4 %   MCV 89.9  78.0 - 100.0 fL   MCH 30.8  26.0 - 34.0 pg   MCHC 34.3  30.0 - 36.0 g/dL   RDW 78.2  95.6 - 21.3 %   Platelets 126 (*) 150 - 400 K/uL   Neutrophils Relative 65  43 - 77 %   Neutro Abs 3.5  1.7 - 7.7 K/uL   Lymphocytes Relative 22  12 - 46 %   Lymphs Abs 1.2  0.7 - 4.0 K/uL   Monocytes Relative 9  3 - 12 %   Monocytes Absolute 0.5  0.1 - 1.0 K/uL   Eosinophils Relative 3  0 - 5 %   Eosinophils Absolute 0.2  0.0 - 0.7 K/uL   Basophils Relative 1  0 - 1 %   Basophils Absolute 0.1  0.0 - 0.1 K/uL  COMPREHENSIVE METABOLIC PANEL      Component Value Range   Sodium 141  135 - 145 mEq/L   Potassium 4.1  3.5 - 5.1 mEq/L   Chloride 104  96  - 112 mEq/L   CO2 28  19 - 32 mEq/L   Glucose, Bld 100 (*) 70 - 99 mg/dL   BUN 17  6 - 23 mg/dL   Creatinine, Ser 1.30  0.50 - 1.35 mg/dL   Calcium 9.6  8.4 - 86.5 mg/dL   Total Protein 6.6  6.0 - 8.3 g/dL   Albumin 3.8  3.5 - 5.2 g/dL   AST 25  0 - 37 U/L   ALT 26  0 - 53 U/L   Alkaline Phosphatase 53  39 - 117 U/L   Total Bilirubin 1.0  0.3 - 1.2 mg/dL   GFR calc non Af Amer 66 (*) >90 mL/min   GFR calc Af Amer 76 (*) >90 mL/min  URINALYSIS, ROUTINE W REFLEX MICROSCOPIC      Component Value Range   Color, Urine YELLOW  YELLOW   APPearance CLEAR  CLEAR   Specific Gravity, Urine 1.012  1.005 - 1.030   pH 5.0  5.0 - 8.0   Glucose, UA NEGATIVE  NEGATIVE mg/dL   Hgb urine dipstick NEGATIVE  NEGATIVE   Bilirubin Urine NEGATIVE  NEGATIVE   Ketones, ur NEGATIVE  NEGATIVE mg/dL   Protein, ur NEGATIVE  NEGATIVE mg/dL   Urobilinogen, UA 0.2  0.0 - 1.0 mg/dL   Nitrite NEGATIVE  NEGATIVE   Leukocytes, UA NEGATIVE  NEGATIVE  PROTIME-INR      Component Value Range   Prothrombin Time 22.1 (*) 11.6 - 15.2 seconds   INR 2.03 (*) 0.00 - 1.49  APTT      Component Value Range   aPTT 41 (*) 24 - 37 seconds  POCT I-STAT TROPONIN I      Component Value Range   Troponin i, poc 0.00  0.00 - 0.08 ng/mL   Comment 3            Dg Chest 2 View  11/02/2012  *RADIOLOGY REPORT*  Clinical Data: Short of breath, atrial fibrillation.  Aortic aneurysm  CHEST - 2 VIEW  Comparison: 02/05/2009  Findings: Cardiac enlargement, stable.  Mild to enlargement of the aortic arch.  Negative for heart failure.  Lungs are clear without infiltrate effusion or mass lesion.  IMPRESSION: No acute cardiopulmonary abnormality.   Original Report Authenticated By: Janeece Riggers, M.D.     Lab workup is negative.  Pt is asymptomatic.  Call to Dr. Jim Like -->  11:03 AM Dr. Eldridge Dace is OK for him to go home and to have followup in the office next week.  1. Atrial fibrillation              Carleene Cooper III,  MD 11/02/12 [phone]

## 2012-11-02 NOTE — ED Notes (Signed)
Pt from home c/o SOB. Denies CP. Pt states he feels his heart flutter and gets sob.

## 2012-11-02 NOTE — ED Notes (Signed)
Patient transported to X-ray 

## 2012-11-07 DIAGNOSIS — I4891 Unspecified atrial fibrillation: Secondary | ICD-10-CM | POA: Diagnosis not present

## 2012-12-22 DIAGNOSIS — Z7901 Long term (current) use of anticoagulants: Secondary | ICD-10-CM | POA: Diagnosis not present

## 2012-12-22 DIAGNOSIS — I4891 Unspecified atrial fibrillation: Secondary | ICD-10-CM | POA: Diagnosis not present

## 2013-01-04 DIAGNOSIS — M65839 Other synovitis and tenosynovitis, unspecified forearm: Secondary | ICD-10-CM | POA: Diagnosis not present

## 2013-01-04 DIAGNOSIS — M65849 Other synovitis and tenosynovitis, unspecified hand: Secondary | ICD-10-CM | POA: Diagnosis not present

## 2013-01-04 DIAGNOSIS — M75120 Complete rotator cuff tear or rupture of unspecified shoulder, not specified as traumatic: Secondary | ICD-10-CM | POA: Diagnosis not present

## 2013-01-04 DIAGNOSIS — Z4889 Encounter for other specified surgical aftercare: Secondary | ICD-10-CM | POA: Diagnosis not present

## 2013-01-08 DIAGNOSIS — R791 Abnormal coagulation profile: Secondary | ICD-10-CM | POA: Diagnosis not present

## 2013-01-08 DIAGNOSIS — R42 Dizziness and giddiness: Secondary | ICD-10-CM | POA: Diagnosis not present

## 2013-01-08 DIAGNOSIS — S06300A Unspecified focal traumatic brain injury without loss of consciousness, initial encounter: Secondary | ICD-10-CM | POA: Diagnosis not present

## 2013-01-08 DIAGNOSIS — W1809XA Striking against other object with subsequent fall, initial encounter: Secondary | ICD-10-CM | POA: Diagnosis not present

## 2013-01-08 DIAGNOSIS — E785 Hyperlipidemia, unspecified: Secondary | ICD-10-CM | POA: Diagnosis not present

## 2013-01-08 DIAGNOSIS — S0993XA Unspecified injury of face, initial encounter: Secondary | ICD-10-CM | POA: Diagnosis not present

## 2013-01-08 DIAGNOSIS — E039 Hypothyroidism, unspecified: Secondary | ICD-10-CM | POA: Diagnosis not present

## 2013-01-08 DIAGNOSIS — R51 Headache: Secondary | ICD-10-CM | POA: Diagnosis not present

## 2013-01-08 DIAGNOSIS — Z880 Allergy status to penicillin: Secondary | ICD-10-CM | POA: Diagnosis not present

## 2013-01-08 DIAGNOSIS — Z7901 Long term (current) use of anticoagulants: Secondary | ICD-10-CM | POA: Diagnosis not present

## 2013-01-08 DIAGNOSIS — I4891 Unspecified atrial fibrillation: Secondary | ICD-10-CM | POA: Diagnosis not present

## 2013-01-08 DIAGNOSIS — M109 Gout, unspecified: Secondary | ICD-10-CM | POA: Diagnosis not present

## 2013-01-08 DIAGNOSIS — IMO0002 Reserved for concepts with insufficient information to code with codable children: Secondary | ICD-10-CM | POA: Diagnosis not present

## 2013-01-08 DIAGNOSIS — M542 Cervicalgia: Secondary | ICD-10-CM | POA: Diagnosis not present

## 2013-01-08 DIAGNOSIS — S199XXA Unspecified injury of neck, initial encounter: Secondary | ICD-10-CM | POA: Diagnosis not present

## 2013-01-08 DIAGNOSIS — I495 Sick sinus syndrome: Secondary | ICD-10-CM | POA: Diagnosis not present

## 2013-01-08 DIAGNOSIS — G4733 Obstructive sleep apnea (adult) (pediatric): Secondary | ICD-10-CM | POA: Diagnosis not present

## 2013-01-08 DIAGNOSIS — Z87891 Personal history of nicotine dependence: Secondary | ICD-10-CM | POA: Diagnosis not present

## 2013-01-08 DIAGNOSIS — I498 Other specified cardiac arrhythmias: Secondary | ICD-10-CM | POA: Diagnosis not present

## 2013-01-08 DIAGNOSIS — I629 Nontraumatic intracranial hemorrhage, unspecified: Secondary | ICD-10-CM | POA: Diagnosis not present

## 2013-01-08 DIAGNOSIS — S0990XA Unspecified injury of head, initial encounter: Secondary | ICD-10-CM | POA: Diagnosis not present

## 2013-01-08 DIAGNOSIS — S066XAA Traumatic subarachnoid hemorrhage with loss of consciousness status unknown, initial encounter: Secondary | ICD-10-CM | POA: Diagnosis not present

## 2013-01-08 DIAGNOSIS — R296 Repeated falls: Secondary | ICD-10-CM | POA: Diagnosis not present

## 2013-01-08 DIAGNOSIS — S0190XA Unspecified open wound of unspecified part of head, initial encounter: Secondary | ICD-10-CM | POA: Diagnosis not present

## 2013-01-17 DIAGNOSIS — I719 Aortic aneurysm of unspecified site, without rupture: Secondary | ICD-10-CM | POA: Diagnosis not present

## 2013-01-17 DIAGNOSIS — I629 Nontraumatic intracranial hemorrhage, unspecified: Secondary | ICD-10-CM | POA: Diagnosis not present

## 2013-01-17 DIAGNOSIS — R350 Frequency of micturition: Secondary | ICD-10-CM | POA: Diagnosis not present

## 2013-01-17 DIAGNOSIS — I4891 Unspecified atrial fibrillation: Secondary | ICD-10-CM | POA: Diagnosis not present

## 2013-01-17 DIAGNOSIS — Z7901 Long term (current) use of anticoagulants: Secondary | ICD-10-CM | POA: Diagnosis not present

## 2013-01-18 DIAGNOSIS — I619 Nontraumatic intracerebral hemorrhage, unspecified: Secondary | ICD-10-CM | POA: Diagnosis not present

## 2013-01-18 DIAGNOSIS — S0990XA Unspecified injury of head, initial encounter: Secondary | ICD-10-CM | POA: Diagnosis not present

## 2013-01-18 DIAGNOSIS — IMO0002 Reserved for concepts with insufficient information to code with codable children: Secondary | ICD-10-CM | POA: Diagnosis not present

## 2013-01-20 ENCOUNTER — Ambulatory Visit (INDEPENDENT_AMBULATORY_CARE_PROVIDER_SITE_OTHER): Payer: Medicare Other | Admitting: Internal Medicine

## 2013-01-20 ENCOUNTER — Encounter: Payer: Self-pay | Admitting: Internal Medicine

## 2013-01-20 ENCOUNTER — Encounter: Payer: Self-pay | Admitting: *Deleted

## 2013-01-20 VITALS — BP 112/70 | HR 52 | Ht 69.0 in | Wt 239.0 lb

## 2013-01-20 DIAGNOSIS — Z01812 Encounter for preprocedural laboratory examination: Secondary | ICD-10-CM | POA: Diagnosis not present

## 2013-01-20 DIAGNOSIS — R55 Syncope and collapse: Secondary | ICD-10-CM | POA: Diagnosis not present

## 2013-01-20 DIAGNOSIS — R2681 Unsteadiness on feet: Secondary | ICD-10-CM | POA: Insufficient documentation

## 2013-01-20 DIAGNOSIS — I4891 Unspecified atrial fibrillation: Secondary | ICD-10-CM | POA: Diagnosis not present

## 2013-01-20 DIAGNOSIS — R001 Bradycardia, unspecified: Secondary | ICD-10-CM | POA: Insufficient documentation

## 2013-01-20 NOTE — Assessment & Plan Note (Signed)
As above.

## 2013-01-20 NOTE — Patient Instructions (Addendum)
Your physician has recommended that you have a pacemaker inserted. A pacemaker is a small device that is placed under the skin of your chest or abdomen to help control abnormal heart rhythms. This device uses electrical pulses to prompt the heart to beat at a normal rate. Pacemakers are used to treat heart rhythms that are too slow. Wire (leads) are attached to the pacemaker that goes into the chambers of you heart. This is done in the hospital and usually requires and overnight stay. Please see the instruction sheet given to you today for more information.  Your physician recommends that you return for lab work in: the week of 02/09/13 BMP, CBC, INR

## 2013-01-20 NOTE — Assessment & Plan Note (Signed)
He has gait instability and fear associated with his fall. I think balance rehabilitation is essential

## 2013-01-20 NOTE — Progress Notes (Signed)
 ELECTROPHYSIOLOGY CONSULT NOTE  Patient ID: Don Jacobs, MRN: [id number], DOB/AGE: 11/12/1935 76 y.o. Admit date: (Not on file) Date of Consult: 01/20/2013  Primary Physician: [name],ROBERT NEVILL, MD Primary Cardiologist: JV   Chief Complaint: bradycardia   HPI Don Jacobs is a 76 y.o. male seen for consideration of pacemaker implantation.  He has a history of atrial fibrillation that dates back about 2 years. It was controlled initially with metoprolol. Over recent months he began having increasing symptoms of exercise intolerance associated with tachypalpitations and documented heart rates in the 130 range. Flecainide initially and then amiodarone were used to try to control the rhythm; both of which were complicated by dizziness.  He also had symptomatic sinus bradycardia prior to the initiation of antiarrhythmic therapy with heart rates in the 40s associated with dyspnea and some lightheadedness.  He had however been able to exercise 3-5 days a week ; including in the pool  per hour at a time and able to walk more than a mile. This is as recent as January.  2 weeks ago he lost his balance after he hit the hitch on the back of his truck. He fell. And ended up with Intracranial hemorrhage.  His amiodarone was discontinued his anticoagulation was stopped. He has started walking with a walker since. Past Medical History  Diagnosis Date  . Hypertension   . A-fib   . Sleep apnea   . Gout   . Hypothyroidism   . Allergic rhinitis   . ED (erectile dysfunction)   . BPH (benign prostatic hyperplasia)   . Hearing loss     uses hearing a cyst  . Hx of migraines   . Prostatitis, chronic   . Spondylolisthesis 07/2010  . Seborrheic keratosis   . Aortic aneurysm, thoracic 05/25/2011      Surgical History:  Past Surgical History  Procedure Laterality Date  . Back surgery  2012 6 weeks ago  . L5 selective nerve root block      Dr Ramos  . Spine surgery      multilevel cervical  disk surgery 10/2000, lumbosacral spine surgery in 09/2010  . Rotator cuff repair  09/2007    right side  . Repair peroneal tendons ankle  10/2003    right side     Home Meds: Prior to Admission medications   Medication Sig Start Date End Date Taking? Authorizing Provider  acetaminophen (TYLENOL) 500 MG tablet Take 500 mg by mouth every 6 (six) hours as needed. For pain/headache   Yes Historical Provider, MD  allopurinol (ZYLOPRIM) 300 MG tablet Take 300 mg by mouth at bedtime and may repeat dose one time if needed.    Yes Historical Provider, MD  butalbital-acetaminophen-caffeine (FIORICET) 50-325-40 MG per tablet Take 1 tablet by mouth 2 (two) times daily as needed for headache.   Yes Historical Provider, MD  Calcium Carbonate (CALTRATE 600 PO) Take 1 capsule by mouth at bedtime.    Yes Historical Provider, MD  cholecalciferol (VITAMIN D) 1000 UNITS tablet Take 1,000 Units by mouth at bedtime.   Yes Historical Provider, MD  colchicine 0.6 MG tablet Take 0.6 mg by mouth daily as needed. For gout   Yes Historical Provider, MD  doxazosin (CARDURA) 8 MG tablet Take 8 mg by mouth at bedtime.     Yes Historical Provider, MD  levothyroxine (SYNTHROID, LEVOTHROID) 112 MCG tablet Take 112 mcg by mouth every evening.    Yes Historical Provider, MD  loratadine (CLARITIN) 10 MG tablet   Take 10 mg by mouth daily.   Yes Historical Provider, MD  Omega-3 Fatty Acids (FISH OIL) 600 MG CAPS Take 600 mg by mouth 2 (two) times daily.   Yes Historical Provider, MD  predniSONE (DELTASONE) 10 MG tablet Take 10 mg by mouth daily as needed. Take as directed as needed for gout flare ups   Yes Historical Provider, MD  vitamin C (ASCORBIC ACID) 500 MG tablet Take 500 mg by mouth daily.   Yes Historical Provider, MD  warfarin (COUMADIN) 5 MG tablet Take 2.5-5 mg by mouth every evening. Takes  2.5mg on Monday, Wednesday, and Friday and 5mg on all other days    Historical Provider, MD    Allergies:  Allergies  Allergen  Reactions  . Penicillins Shortness Of Breath and Rash    Ended up at ER after using    History   Social History  . Marital Status: Married    Spouse Name: N/A    Number of Children: 2  . Years of Education: N/A   Occupational History  . retired    Social History Main Topics  . Smoking status: Former Smoker -- 1.00 packs/day for 12 years    Types: Cigarettes    Quit date: 03/11/1981  . Smokeless tobacco: Never Used  . Alcohol Use: No  . Drug Use: No  . Sexually Active: Not on file   Other Topics Concern  . Not on file   Social History Narrative  . No narrative on file     Family History  Problem Relation Age of Onset  . Heart disease Father      ROS:  Please see the history of present illness.     All other systems reviewed and negative.    Physical Exam:   Blood pressure 112/70, pulse 52, height 5' 9" (1.753 m), weight 239 lb (108.41 kg). General: Well developed, well nourished male in no acute distress. Head: Normocephalic, there is a bruise over his right eye sclera non-icteric, no xanthomas, nares are without discharge. EENT: normal Lymph Nodes:  none Back: without scoliosis/kyphosis , no CVA tendersness Neck: Negative for carotid bruits. JVD not elevated. Lungs: Clear bilaterally to auscultation without wheezes, rales, or rhonchi. Breathing is unlabored. Heart: slow butRRR with S1 S2. No  murmur ,positive S4 Abdomen: Soft, non-tender, non-distended with normoactive bowel sounds. No hepatomegaly. No rebound/guarding. No obvious abdominal masses. Msk:  Strength and tone appear normal for age. Extremities: No clubbing or cyanosis. NoHe edema.  Distal pedal pulses are 2+ and equal bilaterally. Skin: Warm and Dry Neuro: Alert and oriented X 3. CN III-XII intact Grossly normal sensory and motor function . Psych:  Responds to questions appropriately with a normal affect.      Labs: Cardiac Enzymes No results found for this basename: CKTOTAL, CKMB, TROPONINI,   in the last 72 hours CBC Lab Results  Component Value Date   WBC 5.4 11/02/2012   HGB 15.2 11/02/2012   HCT 44.3 11/02/2012   MCV 89.9 11/02/2012   PLT 126* 11/02/2012   PROTIME: No results found for this basename: LABPROT, INR,  in the last 72 hours Chemistry No results found for this basename: NA, K, CL, CO2, BUN, CREATININE, CALCIUM, LABALBU, PROT, BILITOT, ALKPHOS, ALT, AST, GLUCOSE,  in the last 168 hours Lipids No results found for this basename: CHOL, HDL, LDLCALC, TRIG   BNP Pro B Natriuretic peptide (BNP)  Date/Time Value Range Status  11/02/2012  8:48 AM 125.8* 0 - 125 pg/mL Final     Miscellaneous No results found for this basename: DDIMER    Radiology/Studies:  No results found.  EKG:  Sinus rhythm at 52 intervals 19/09/43 Poor R-wave progression Otherwise normal  EVENt Recorder: event recorder demonstrated post termination pause of 7 seconds associated with presyncope   Assessment and Plan:   Cathlene Gardella   

## 2013-01-20 NOTE — Assessment & Plan Note (Addendum)
The patient has atrial fibrillation-paroxysmal associated with a rapid ventricular response. Bradycardia is been present both at baseline as well as with associated drugs undertaken for rate control. Furthermore he has had symptomatic posttermination pauses with presyncope. Pacing is indicated to allow for rate control as well as to prevent posttermination pauses.  This process in Non Reversible The benefits and risks were reviewed including but not limited to death,  perforation, infection, lead dislodgement and device malfunction.  The patient understands agrees and is willing to proceed.  He was to be resumed on his anticoagulation. His CHADS-VASc score is 4, age-8, hypertension-1, and vascular disease- 1. Input from neurosurgery as to time he would be appropriate.

## 2013-01-26 DIAGNOSIS — R109 Unspecified abdominal pain: Secondary | ICD-10-CM | POA: Diagnosis not present

## 2013-01-26 DIAGNOSIS — N200 Calculus of kidney: Secondary | ICD-10-CM | POA: Diagnosis not present

## 2013-01-26 DIAGNOSIS — N139 Obstructive and reflux uropathy, unspecified: Secondary | ICD-10-CM | POA: Diagnosis not present

## 2013-01-26 DIAGNOSIS — N529 Male erectile dysfunction, unspecified: Secondary | ICD-10-CM | POA: Diagnosis not present

## 2013-01-26 DIAGNOSIS — N401 Enlarged prostate with lower urinary tract symptoms: Secondary | ICD-10-CM | POA: Diagnosis not present

## 2013-01-26 DIAGNOSIS — N281 Cyst of kidney, acquired: Secondary | ICD-10-CM | POA: Diagnosis not present

## 2013-01-26 DIAGNOSIS — R350 Frequency of micturition: Secondary | ICD-10-CM | POA: Diagnosis not present

## 2013-02-07 DIAGNOSIS — I619 Nontraumatic intracerebral hemorrhage, unspecified: Secondary | ICD-10-CM | POA: Diagnosis not present

## 2013-02-07 DIAGNOSIS — S0990XA Unspecified injury of head, initial encounter: Secondary | ICD-10-CM | POA: Diagnosis not present

## 2013-02-09 ENCOUNTER — Encounter (HOSPITAL_COMMUNITY): Payer: Self-pay | Admitting: Pharmacy Technician

## 2013-02-09 ENCOUNTER — Other Ambulatory Visit (INDEPENDENT_AMBULATORY_CARE_PROVIDER_SITE_OTHER): Payer: Medicare Other

## 2013-02-09 DIAGNOSIS — Z01812 Encounter for preprocedural laboratory examination: Secondary | ICD-10-CM

## 2013-02-09 DIAGNOSIS — R55 Syncope and collapse: Secondary | ICD-10-CM

## 2013-02-09 LAB — BASIC METABOLIC PANEL
CO2: 31 mEq/L (ref 19–32)
Calcium: 9.2 mg/dL (ref 8.4–10.5)
Chloride: 106 mEq/L (ref 96–112)
Creatinine, Ser: 1.4 mg/dL (ref 0.4–1.5)
Sodium: 145 mEq/L (ref 135–145)

## 2013-02-09 LAB — CBC WITH DIFFERENTIAL/PLATELET
Basophils Relative: 0.5 % (ref 0.0–3.0)
Eosinophils Absolute: 0.1 10*3/uL (ref 0.0–0.7)
Eosinophils Relative: 1.5 % (ref 0.0–5.0)
Hemoglobin: 14.3 g/dL (ref 13.0–17.0)
Lymphocytes Relative: 22.1 % (ref 12.0–46.0)
MCHC: 33.7 g/dL (ref 30.0–36.0)
Neutro Abs: 3.1 10*3/uL (ref 1.4–7.7)
Neutrophils Relative %: 67 % (ref 43.0–77.0)
RBC: 4.56 Mil/uL (ref 4.22–5.81)
WBC: 4.6 10*3/uL (ref 4.5–10.5)

## 2013-02-09 LAB — PROTIME-INR: INR: 1.1 ratio — ABNORMAL HIGH (ref 0.8–1.0)

## 2013-02-16 DIAGNOSIS — I495 Sick sinus syndrome: Secondary | ICD-10-CM | POA: Diagnosis not present

## 2013-02-16 DIAGNOSIS — I10 Essential (primary) hypertension: Secondary | ICD-10-CM | POA: Diagnosis not present

## 2013-02-16 DIAGNOSIS — Z79899 Other long term (current) drug therapy: Secondary | ICD-10-CM | POA: Diagnosis not present

## 2013-02-16 DIAGNOSIS — G4733 Obstructive sleep apnea (adult) (pediatric): Secondary | ICD-10-CM | POA: Diagnosis not present

## 2013-02-16 DIAGNOSIS — I4891 Unspecified atrial fibrillation: Secondary | ICD-10-CM | POA: Diagnosis not present

## 2013-02-16 MED ORDER — SODIUM CHLORIDE 0.9 % IV SOLN
INTRAVENOUS | Status: DC
  Administered 2013-02-17: 50 mL/h via INTRAVENOUS

## 2013-02-16 MED ORDER — CHLORHEXIDINE GLUCONATE 4 % EX LIQD
60.0000 mL | Freq: Once | CUTANEOUS | Status: DC
  Filled 2013-02-16: qty 60

## 2013-02-16 MED ORDER — SODIUM CHLORIDE 0.9 % IR SOLN
80.0000 mg | Status: DC
  Filled 2013-02-16: qty 2

## 2013-02-16 MED ORDER — VANCOMYCIN HCL 10 G IV SOLR
1500.0000 mg | INTRAVENOUS | Status: DC
  Administered 2013-02-17: 1500 mg via INTRAVENOUS
  Filled 2013-02-16: qty 1500

## 2013-02-16 MED ORDER — SODIUM CHLORIDE 0.9 % IV SOLN
INTRAVENOUS | Status: DC
  Administered 2013-02-17: 12:00:00 via INTRAVENOUS
  Administered 2013-02-17: 50 mL/h via INTRAVENOUS

## 2013-02-17 ENCOUNTER — Encounter (HOSPITAL_COMMUNITY): Payer: Self-pay | Admitting: General Practice

## 2013-02-17 ENCOUNTER — Ambulatory Visit (HOSPITAL_COMMUNITY)
Admission: RE | Admit: 2013-02-17 | Discharge: 2013-02-18 | Disposition: A | Discharge status: Home / Self Care | Payer: Medicare Other | Source: Ambulatory Visit | Attending: Internal Medicine | Admitting: Internal Medicine

## 2013-02-17 ENCOUNTER — Encounter (HOSPITAL_COMMUNITY)
Admission: RE | Disposition: A | Discharge status: Home / Self Care | Payer: Self-pay | Source: Ambulatory Visit | Attending: Internal Medicine

## 2013-02-17 DIAGNOSIS — E039 Hypothyroidism, unspecified: Secondary | ICD-10-CM

## 2013-02-17 DIAGNOSIS — R55 Syncope and collapse: Secondary | ICD-10-CM

## 2013-02-17 DIAGNOSIS — Z79899 Other long term (current) drug therapy: Secondary | ICD-10-CM | POA: Diagnosis not present

## 2013-02-17 DIAGNOSIS — I4891 Unspecified atrial fibrillation: Secondary | ICD-10-CM

## 2013-02-17 DIAGNOSIS — M75102 Unspecified rotator cuff tear or rupture of left shoulder, not specified as traumatic: Secondary | ICD-10-CM

## 2013-02-17 DIAGNOSIS — N529 Male erectile dysfunction, unspecified: Secondary | ICD-10-CM

## 2013-02-17 DIAGNOSIS — N400 Enlarged prostate without lower urinary tract symptoms: Secondary | ICD-10-CM

## 2013-02-17 DIAGNOSIS — G4733 Obstructive sleep apnea (adult) (pediatric): Secondary | ICD-10-CM | POA: Insufficient documentation

## 2013-02-17 DIAGNOSIS — I712 Thoracic aortic aneurysm, without rupture: Secondary | ICD-10-CM

## 2013-02-17 DIAGNOSIS — N411 Chronic prostatitis: Secondary | ICD-10-CM

## 2013-02-17 DIAGNOSIS — R001 Bradycardia, unspecified: Secondary | ICD-10-CM

## 2013-02-17 DIAGNOSIS — G4734 Idiopathic sleep related nonobstructive alveolar hypoventilation: Secondary | ICD-10-CM

## 2013-02-17 DIAGNOSIS — I10 Essential (primary) hypertension: Secondary | ICD-10-CM | POA: Diagnosis not present

## 2013-02-17 DIAGNOSIS — M4310 Spondylolisthesis, site unspecified: Secondary | ICD-10-CM

## 2013-02-17 DIAGNOSIS — I495 Sick sinus syndrome: Secondary | ICD-10-CM | POA: Diagnosis not present

## 2013-02-17 DIAGNOSIS — Z01812 Encounter for preprocedural laboratory examination: Secondary | ICD-10-CM

## 2013-02-17 DIAGNOSIS — R2681 Unsteadiness on feet: Secondary | ICD-10-CM

## 2013-02-17 HISTORY — PX: INSERT / REPLACE / REMOVE PACEMAKER: SUR710

## 2013-02-17 HISTORY — PX: PERMANENT PACEMAKER INSERTION: SHX5480

## 2013-02-17 SURGERY — PERMANENT PACEMAKER INSERTION
Anesthesia: LOCAL

## 2013-02-17 MED ORDER — LEVOTHYROXINE SODIUM 112 MCG PO TABS
112.0000 ug | ORAL_TABLET | Freq: Every evening | ORAL | Status: DC
  Administered 2013-02-17: 112 ug via ORAL
  Filled 2013-02-17 (×2): qty 1

## 2013-02-17 MED ORDER — VITAMIN D3 25 MCG (1000 UNIT) PO TABS
1000.0000 [IU] | ORAL_TABLET | Freq: Every day | ORAL | Status: DC
  Administered 2013-02-17: 1000 [IU] via ORAL
  Filled 2013-02-17 (×2): qty 1

## 2013-02-17 MED ORDER — MUPIROCIN 2 % EX OINT: TOPICAL_OINTMENT | Freq: Two times a day (BID) | CUTANEOUS | Status: DC

## 2013-02-17 MED ORDER — VANCOMYCIN HCL IN DEXTROSE 1-5 GM/200ML-% IV SOLN
1000.0000 mg | Freq: Two times a day (BID) | INTRAVENOUS | Status: AC
  Administered 2013-02-18: 1000 mg via INTRAVENOUS
  Filled 2013-02-17: qty 200

## 2013-02-17 MED ORDER — LIDOCAINE HCL (PF) 1 % IJ SOLN
INTRAMUSCULAR | Status: AC
  Filled 2013-02-17: qty 60

## 2013-02-17 MED ORDER — VITAMIN C 500 MG PO TABS
500.0000 mg | ORAL_TABLET | Freq: Every day | ORAL | Status: DC
  Administered 2013-02-17: 500 mg via ORAL
  Filled 2013-02-17 (×2): qty 1

## 2013-02-17 MED ORDER — MUPIROCIN 2 % EX OINT
TOPICAL_OINTMENT | CUTANEOUS | Status: AC
  Filled 2013-02-17: qty 22

## 2013-02-17 MED ORDER — ACETAMINOPHEN 325 MG PO TABS: 325.0000 mg | ORAL_TABLET | ORAL | Status: DC | PRN

## 2013-02-17 MED ORDER — MIDAZOLAM HCL 5 MG/5ML IJ SOLN
INTRAMUSCULAR | Status: AC
  Filled 2013-02-17: qty 5

## 2013-02-17 MED ORDER — LORATADINE 10 MG PO TABS
10.0000 mg | ORAL_TABLET | Freq: Every day | ORAL | Status: DC
  Administered 2013-02-17: 10 mg via ORAL
  Filled 2013-02-17 (×2): qty 1

## 2013-02-17 MED ORDER — DOXAZOSIN MESYLATE 8 MG PO TABS
8.0000 mg | ORAL_TABLET | Freq: Every day | ORAL | Status: DC
  Administered 2013-02-17: 8 mg via ORAL
  Filled 2013-02-17 (×2): qty 1

## 2013-02-17 MED ORDER — FENTANYL CITRATE 0.05 MG/ML IJ SOLN
INTRAMUSCULAR | Status: AC
  Filled 2013-02-17: qty 2

## 2013-02-17 MED ORDER — BUTALBITAL-APAP-CAFFEINE 50-325-40 MG PO TABS: 1.0000 | ORAL_TABLET | Freq: Two times a day (BID) | ORAL | Status: DC | PRN

## 2013-02-17 MED ORDER — ONDANSETRON HCL 4 MG/2ML IJ SOLN: 4.0000 mg | Freq: Four times a day (QID) | INTRAMUSCULAR | Status: DC | PRN

## 2013-02-17 MED ORDER — SODIUM CHLORIDE 0.9 % IV SOLN
INTRAVENOUS | Status: AC
Start: 2013-02-17 — End: 2013-02-17
  Administered 2013-02-17: 16:00:00 via INTRAVENOUS

## 2013-02-17 MED ORDER — ALLOPURINOL 300 MG PO TABS
300.0000 mg | ORAL_TABLET | Freq: Every day | ORAL | Status: DC
  Administered 2013-02-17: 300 mg via ORAL
  Filled 2013-02-17 (×2): qty 1

## 2013-02-17 NOTE — Interval H&P Note (Signed)
History and Physical Interval Note:  02/17/2013 1:28 PM  Don Jacobs  has presented today for surgery, with the diagnosis of bradycardia  The various methods of treatment have been discussed with the patient and family. After consideration of risks, benefits and other options for treatment, the patient has consented to  Procedure(s): PERMANENT PACEMAKER INSERTION (N/A) as a surgical intervention .  The patient's history has been reviewed, patient examined, no change in status, stable for surgery.  I have reviewed the patient's chart and labs.  Questions were answered to the patient's satisfaction.    contnues with modest exercise intolerance  Sherryl Manges

## 2013-02-17 NOTE — Interval H&P Note (Signed)
History and Physical Interval Note:  02/17/2013 1:29 PM  Don Jacobs  has presented today for surgery, with the diagnosis of bradycardia  The various methods of treatment have been discussed with the patient and family. After consideration of risks, benefits and other options for treatment, the patient has consented to  Procedure(s): PERMANENT PACEMAKER INSERTION (N/A) as a surgical intervention .  The patient's history has been reviewed, patient examined, no change in status, stable for surgery.  I have reviewed the patient's chart and labs.  Questions were answered to the patient's satisfaction.    Had discussion with patient  They would prefer an MRI compatible generator  Sherryl Manges

## 2013-02-17 NOTE — H&P (View-Only) (Signed)
ELECTROPHYSIOLOGY CONSULT NOTE  Patient ID: Don Jacobs, MRN: [id number], DOB/AGE: 28-Nov-1935 77 y.o. Admit date: (Not on file) Date of Consult: 01/20/2013  Primary Physician: Pearla Dubonnet, MD Primary Cardiologist: JV   Chief Complaint: bradycardia   HPI Don Jacobs is a 77 y.o. male seen for consideration of pacemaker implantation.  He has a history of atrial fibrillation that dates back about 2 years. It was controlled initially with metoprolol. Over recent months he began having increasing symptoms of exercise intolerance associated with tachypalpitations and documented heart rates in the 130 range. Flecainide initially and then amiodarone were used to try to control the rhythm; both of which were complicated by dizziness.  He also had symptomatic sinus bradycardia prior to the initiation of antiarrhythmic therapy with heart rates in the 40s associated with dyspnea and some lightheadedness.  He had however been able to exercise 3-5 days a week ; including in the pool  per hour at a time and able to walk more than a mile. This is as recent as January.  2 weeks ago he lost his balance after he hit the hitch on the back of his truck. He fell. And ended up with Intracranial hemorrhage.  His amiodarone was discontinued his anticoagulation was stopped. He has started walking with a walker since. Past Medical History  Diagnosis Date  . Hypertension   . A-fib   . Sleep apnea   . Gout   . Hypothyroidism   . Allergic rhinitis   . ED (erectile dysfunction)   . BPH (benign prostatic hyperplasia)   . Hearing loss     uses hearing a cyst  . Hx of migraines   . Prostatitis, chronic   . Spondylolisthesis 07/2010  . Seborrheic keratosis   . Aortic aneurysm, thoracic 05/25/2011      Surgical History:  Past Surgical History  Procedure Laterality Date  . Back surgery  2012 6 weeks ago  . L5 selective nerve root block      Dr Ethelene Hal  . Spine surgery      multilevel cervical  disk surgery 10/2000, lumbosacral spine surgery in 09/2010  . Rotator cuff repair  09/2007    right side  . Repair peroneal tendons ankle  10/2003    right side     Home Meds: Prior to Admission medications   Medication Sig Start Date End Date Taking? Authorizing Provider  acetaminophen (TYLENOL) 500 MG tablet Take 500 mg by mouth every 6 (six) hours as needed. For pain/headache   Yes Historical Provider, MD  allopurinol (ZYLOPRIM) 300 MG tablet Take 300 mg by mouth at bedtime and may repeat dose one time if needed.    Yes Historical Provider, MD  butalbital-acetaminophen-caffeine (FIORICET) 50-325-40 MG per tablet Take 1 tablet by mouth 2 (two) times daily as needed for headache.   Yes Historical Provider, MD  Calcium Carbonate (CALTRATE 600 PO) Take 1 capsule by mouth at bedtime.    Yes Historical Provider, MD  cholecalciferol (VITAMIN D) 1000 UNITS tablet Take 1,000 Units by mouth at bedtime.   Yes Historical Provider, MD  colchicine 0.6 MG tablet Take 0.6 mg by mouth daily as needed. For gout   Yes Historical Provider, MD  doxazosin (CARDURA) 8 MG tablet Take 8 mg by mouth at bedtime.     Yes Historical Provider, MD  levothyroxine (SYNTHROID, LEVOTHROID) 112 MCG tablet Take 112 mcg by mouth every evening.    Yes Historical Provider, MD  loratadine (CLARITIN) 10 MG tablet  Take 10 mg by mouth daily.   Yes Historical Provider, MD  Omega-3 Fatty Acids (FISH OIL) 600 MG CAPS Take 600 mg by mouth 2 (two) times daily.   Yes Historical Provider, MD  predniSONE (DELTASONE) 10 MG tablet Take 10 mg by mouth daily as needed. Take as directed as needed for gout flare ups   Yes Historical Provider, MD  vitamin C (ASCORBIC ACID) 500 MG tablet Take 500 mg by mouth daily.   Yes Historical Provider, MD  warfarin (COUMADIN) 5 MG tablet Take 2.5-5 mg by mouth every evening. Takes  2.5mg  on Monday, Wednesday, and Friday and 5mg  on all other days    Historical Provider, MD    Allergies:  Allergies  Allergen  Reactions  . Penicillins Shortness Of Breath and Rash    Ended up at ER after using    History   Social History  . Marital Status: Married    Spouse Name: N/A    Number of Children: 2  . Years of Education: N/A   Occupational History  . retired    Social History Main Topics  . Smoking status: Former Smoker -- 1.00 packs/day for 12 years    Types: Cigarettes    Quit date: 03/11/1981  . Smokeless tobacco: Never Used  . Alcohol Use: No  . Drug Use: No  . Sexually Active: Not on file   Other Topics Concern  . Not on file   Social History Narrative  . No narrative on file     Family History  Problem Relation Age of Onset  . Heart disease Father      ROS:  Please see the history of present illness.     All other systems reviewed and negative.    Physical Exam:   Blood pressure 112/70, pulse 52, height 5\' 9"  (1.753 m), weight 239 lb (108.41 kg). General: Well developed, well nourished male in no acute distress. Head: Normocephalic, there is a bruise over his right eye sclera non-icteric, no xanthomas, nares are without discharge. EENT: normal Lymph Nodes:  none Back: without scoliosis/kyphosis , no CVA tendersness Neck: Negative for carotid bruits. JVD not elevated. Lungs: Clear bilaterally to auscultation without wheezes, rales, or rhonchi. Breathing is unlabored. Heart: slow butRRR with S1 S2. No  murmur ,positive S4 Abdomen: Soft, non-tender, non-distended with normoactive bowel sounds. No hepatomegaly. No rebound/guarding. No obvious abdominal masses. Msk:  Strength and tone appear normal for age. Extremities: No clubbing or cyanosis. NoHe edema.  Distal pedal pulses are 2+ and equal bilaterally. Skin: Warm and Dry Neuro: Alert and oriented X 3. CN III-XII intact Grossly normal sensory and motor function . Psych:  Responds to questions appropriately with a normal affect.      Labs: Cardiac Enzymes No results found for this basename: CKTOTAL, CKMB, TROPONINI,   in the last 72 hours CBC Lab Results  Component Value Date   WBC 5.4 11/02/2012   HGB 15.2 11/02/2012   HCT 44.3 11/02/2012   MCV 89.9 11/02/2012   PLT 126* 11/02/2012   PROTIME: No results found for this basename: LABPROT, INR,  in the last 72 hours Chemistry No results found for this basename: NA, K, CL, CO2, BUN, CREATININE, CALCIUM, LABALBU, PROT, BILITOT, ALKPHOS, ALT, AST, GLUCOSE,  in the last 168 hours Lipids No results found for this basename: CHOL, HDL, LDLCALC, TRIG   BNP Pro B Natriuretic peptide (BNP)  Date/Time Value Range Status  11/02/2012  8:48 AM 125.8* 0 - 125 pg/mL Final  Miscellaneous No results found for this basename: DDIMER    Radiology/Studies:  No results found.  EKG:  Sinus rhythm at 52 intervals 19/09/43 Poor R-wave progression Otherwise normal  EVENt Recorder: event recorder demonstrated post termination pause of 7 seconds associated with presyncope   Assessment and Plan:   Sherryl Manges

## 2013-02-17 NOTE — CV Procedure (Signed)
Preop DX:: tahcy brady syndrome--symptomatic irreversible sinus node dysfunction   Post op DX:: same  Procedure  dual pacemaker implantation  After routine prep and drape, lidocaine was infiltrated in the prepectoral subclavicular region on the left side an incision was made and carried down to later the prepectoral fascia using electrocautery and sharp dissection a pocket was formed similarly. Hemostasis was obtained.  After this, we turned our attention to gaining accessm to the extrathoracic,left subclavian vein. This was accomplished without difficulty and without the aspiration of air or puncture of the artery. 2 separate venipunctures were accomplished; guidewires were placed and retained and sequentially 7 French sheath through which were  passed an Medtronic 5076  ventricular lead serial [id number] and an Medtronic 5076 atrial lead serial number [id number] .  The ventricular lead was manipulated to the right ventricular apex with a bipolar R wave was 8.2, the pacing impedance was 1138, the threshold was 0.9 @ 0.4 msec  Current at threshold was   0.8  Ma and the current of injury was  BRISK.  The right atrial lead was manipulated to the right atrial appendage with a bipolar P-wave  1.4, the pacing impedance was 936, the threshold 1.1@ 0.4 msec   Current at threshold was 1.5  Ma and the current of injury was BRISK.  The ventricular lead was marked with a tie prior to the insertion of the atrial lead. The leads were affixed to the prepectoral fascia and attached to a  Medtronic ADVISA  Mri COMPATIBLE pulse generator serial number [id number].  Hemostasis was obtained. The pocket was copiously irrigated with antibiotic containing saline solution. The leads and the pulse generator were placed in the pocket and affixed to the prepectoral fascia. The wound was then closed in 3 layers in the normal fashion. The wound was washed dried and a benzoin Steri-Strip dressing was applied.  Needle   Count, sponge counts and instrument counts were correct at the end of the procedure .   The patient tolerated the procedure without apparent complication.  Gerlene Burdock.D.

## 2013-02-17 NOTE — Progress Notes (Signed)
Pt will resume betablocker when he goes home  He will review w dr Hedy Jacob anticoagulants

## 2013-02-18 ENCOUNTER — Ambulatory Visit (HOSPITAL_COMMUNITY): Payer: Medicare Other

## 2013-02-18 DIAGNOSIS — G4733 Obstructive sleep apnea (adult) (pediatric): Secondary | ICD-10-CM | POA: Diagnosis not present

## 2013-02-18 DIAGNOSIS — I4891 Unspecified atrial fibrillation: Secondary | ICD-10-CM | POA: Diagnosis not present

## 2013-02-18 DIAGNOSIS — I495 Sick sinus syndrome: Secondary | ICD-10-CM | POA: Diagnosis not present

## 2013-02-18 DIAGNOSIS — J90 Pleural effusion, not elsewhere classified: Secondary | ICD-10-CM | POA: Diagnosis not present

## 2013-02-18 DIAGNOSIS — Z79899 Other long term (current) drug therapy: Secondary | ICD-10-CM | POA: Diagnosis not present

## 2013-02-18 DIAGNOSIS — I10 Essential (primary) hypertension: Secondary | ICD-10-CM | POA: Diagnosis not present

## 2013-02-18 DIAGNOSIS — R55 Syncope and collapse: Secondary | ICD-10-CM

## 2013-02-18 MED ORDER — METOPROLOL TARTRATE 25 MG PO TABS: 25.0000 mg | ORAL_TABLET | Freq: Two times a day (BID) | ORAL | Status: DC

## 2013-02-18 MED ORDER — METOPROLOL TARTRATE 25 MG PO TABS
25.0000 mg | ORAL_TABLET | Freq: Two times a day (BID) | ORAL | Status: DC
  Filled 2013-02-18 (×2): qty 1

## 2013-02-18 NOTE — Progress Notes (Signed)
Pt discharged per Md order and protocol. Discharge instructions reviewed with patient and wife, all questions answered. Pt aware of follow up appointments and pt given prescriptions.

## 2013-02-18 NOTE — Progress Notes (Signed)
Assessed for utilization review 

## 2013-02-18 NOTE — Progress Notes (Signed)
Pt ambulated in hallway 350 ft with wife and tolerated activity well. Will continue to monitor.

## 2013-02-18 NOTE — Progress Notes (Signed)
  Don Jacobs  77 y.o.  male  Subjective: Doing extremely well. He reports absolutely no pain at pacemaker insertion site.  Allergy: Penicillins and Oxycodone  Objective: Vital signs in last 24 hours: Temp:  [98.1 F (36.7 C)-98.2 F (36.8 C)] 98.2 F (36.8 C) (05/24 0521) Pulse Rate:  [60-65] 65 (05/24 0521) Resp:  [18-20] 18 (05/24 0521) BP: (123-141)/(75-85) 123/77 mmHg (05/24 0521) SpO2:  [95 %-97 %] 96 % (05/24 0521)  108.41 kg (239 lb) Body mass index is 35.28 kg/(m^2).  Weight change:  Last BM Date: 02/17/13  Intake/Output from previous day: 05/23 0701 - 05/24 0700 In: 560 [P.O.:360; IV Piggyback:200] Out: -   General- Well developed; no acute distress; dry dressing and no tenderness at the surgical site Neck- No JVD Lungs- clear lung fields; normal I:E ratio Cardiovascular- normal PMI; normal S1 and S2; minimal systolic ejection murmur Abdomen- normal bowel sounds; soft and non-tender without masses or organomegaly Skin- Warm, no significant lesions Extremities- no edema  EKG: Normal sinus rhythm; within normal limits.  Imaging Studies/Results: Dg Chest 2 View  02/18/2013   *RADIOLOGY REPORT*  Clinical Data: Post pacemaker placement.  CHEST - 2 VIEW  Comparison: Two-view chest x-ray 11/02/2012.  Findings: Left subclavian dual lead transvenous pacemaker with the lead tips projected over the expected location of the right atrial appendage and right ventricular apex.  No evidence of pneumothorax or mediastinal hematoma.  Cardiac silhouette mildly enlarged but stable.  Hilar and mediastinal contours otherwise unremarkable. Very small bilateral pleural effusions.  Lungs clear. Bronchovascular markings normal.  Pulmonary vascularity normal.  No pneumothorax.  Degenerative changes and DISH involving the thoracic spine.  IMPRESSION:  1.  Left subclavian dual lead transvenous pacemaker appropriately positioned without acute complicating features. 2.  Stable mild cardiomegaly.   Very small bilateral pleural effusions.  No acute cardiopulmonary disease otherwise.   Original Report Authenticated By: Hulan Saas, M.D.    Imaging: Imaging results have been reviewed  Medications:  I have reviewed the patient's current medications. Scheduled: . allopurinol  300 mg Oral QHS  . cholecalciferol  1,000 Units Oral QHS  . doxazosin  8 mg Oral QHS  . levothyroxine  112 mcg Oral QPM  . loratadine  10 mg Oral Daily  . vitamin C  500 mg Oral Daily   Assessment/Plan: Sick sinus syndrome: No atrial fibrillation in hospital; uncomplicated pacemaker implantation so far. Patient will be discharged with standard instructions for self-care and followup.  Metoprolol started at 25 mg twice a day.   LOS: 1 day   [name] 02/18/2013, 11:42 AM

## 2013-02-18 NOTE — Progress Notes (Signed)
Pt ambulated in hallway 200 ft with wife and tolerated activity well. Will continue to monitor.

## 2013-02-18 NOTE — Discharge Summary (Signed)
Physician Discharge Summary  Patient ID: Don Jacobs MRN: [id number] DOB/AGE: 1935-12-30 77 y.o.  Admit date: 02/17/2013 Discharge date: 02/18/2013  Primary Discharge Diagnosis: 1. Symptomatic Bradycardia 2. S/P Medtronic AV Pacemaker  Secondary Discharge Diagnosis 1. Atrial fibrillation 2. Hypertension 3. OSA  Hospital Course: Don Jacobs is a 77 year old patient of Dr. Sherryl Manges who was admitted for pacemaker implantation secondary to symptomatic bradycardia. History of atrial fibrillation, which was controlled on metoprolol, but had increasing symptoms of exercise intolerance associated with tachycardia palpitations and documented heart rates in the 130 beats per minute range.      On 02/17/2013 the patient had implantation of Medtronic ADVISA Mri COMPATIBLE pulse generator serial number [id number].per Dr. Graciela Husbands. The patient tolerated procedure well without evidence of bleeding infection or hematoma. The patient was restarted on metoprolol 25 mg daily. On day of discharge the patient was seen and examined by Dr. Graciela Husbands and felt to be stable. He will followup in the pacemaker clinic in 2 weeks and with Dr. Graciela Husbands in 3 months. We will return to medications prior to admission, with post-pacemaker instructions provided.  Discharge Exam: Blood pressure 123/77, pulse 65, temperature 98.2 F (36.8 C), temperature source Oral, resp. rate 18, height 5\' 9"  (1.753 m), weight 239 lb (108.41 kg), SpO2 96.00%.   Labs:   Lab Results  Component Value Date   WBC 4.6 02/09/2013   HGB 14.3 02/09/2013   HCT 42.4 02/09/2013   MCV 93.1 02/09/2013   PLT 115.0* 02/09/2013    Radiology: Dg Chest 2 View  02/18/2013   *RADIOLOGY REPORT*  Clinical Data: Post pacemaker placement.  CHEST - 2 VIEW  Comparison: Two-view chest x-ray 11/02/2012.  Findings: Left subclavian dual lead transvenous pacemaker with the lead tips projected over the expected location of the right atrial appendage and right ventricular apex.   No evidence of pneumothorax or mediastinal hematoma.  Cardiac silhouette mildly enlarged but stable.  Hilar and mediastinal contours otherwise unremarkable. Very small bilateral pleural effusions.  Lungs clear. Bronchovascular markings normal.  Pulmonary vascularity normal.  No pneumothorax.  Degenerative changes and DISH involving the thoracic spine.  IMPRESSION:  1.  Left subclavian dual lead transvenous pacemaker appropriately positioned without acute complicating features. 2.  Stable mild cardiomegaly.  Very small bilateral pleural effusions.  No acute cardiopulmonary disease otherwise.   Original Report Authenticated By: Hulan Saas, M.D.   FOLLOW UP PLANS AND APPOINTMENTS     Discharge Orders   Future Orders Complete By Expires     Diet - low sodium heart healthy  As directed     Discharge instructions  As directed     Comments:      Supplemental Discharge Instructions for  Pacemaker/Defibrillator Patients  Activity No heavy lifting or vigorous activity with your left/right arm for 6 to 8 weeks.  Do not raise your left/right arm above your head for one week.  Gradually raise your affected arm as drawn below.           __  NO DRIVING for   7 days  ; you may begin driving on    7/82/9562.  WOUND CARE Keep the wound area clean and dry.  Do not get this area wet for one week. No showers for one week; you may shower on     . The tape/steri-strips on your wound will fall off; do not pull them off.  No bandage is needed on the site.  DO  NOT apply any creams, oils, or ointments  to the wound area. If you notice any drainage or discharge from the wound, any swelling or bruising at the site, or you develop a fever > 101? F after you are discharged home, call the office at once.  Special Instructions You are still able to use cellular telephones; use the ear opposite the side where you have your pacemaker/defibrillator.  Avoid carrying your cellular phone near your device. When traveling  through airports, show security personnel your identification card to avoid being screened in the metal detectors.  Ask the security personnel to use the hand wand. Avoid arc welding equipment, MRI testing (magnetic resonance imaging), TENS units (transcutaneous nerve stimulators).  Call the office for questions about other devices. Avoid electrical appliances that are in poor condition or are not properly grounded. Microwave ovens are safe to be near or to operate.   DO NOT DRIVE YOURSELF OR A FAMILY MEMBER WITH A DEFIBRILLATOR TO THE HOSPITAL-CALL 911.    Increase activity slowly  As directed         Medication List    TAKE these medications       allopurinol 300 MG tablet  Commonly known as:  ZYLOPRIM  Take 300 mg by mouth at bedtime.     CALTRATE 600 PO  Take 1 capsule by mouth at bedtime.     cholecalciferol 1000 UNITS tablet  Commonly known as:  VITAMIN D  Take 1,000 Units by mouth at bedtime.     colchicine 0.6 MG tablet  Take 0.6 mg by mouth daily as needed. For gout     doxazosin 8 MG tablet  Commonly known as:  CARDURA  Take 8 mg by mouth at bedtime.     FIORICET 50-325-40 MG per tablet  Generic drug:  butalbital-acetaminophen-caffeine  Take 1 tablet by mouth 2 (two) times daily as needed for headache.     Fish Oil 600 MG Caps  Take 600 mg by mouth daily.     levothyroxine 112 MCG tablet  Commonly known as:  SYNTHROID, LEVOTHROID  Take 112 mcg by mouth every evening.     loratadine 10 MG tablet  Commonly known as:  CLARITIN  Take 10 mg by mouth daily.     metoprolol tartrate 25 MG tablet  Commonly known as:  LOPRESSOR  Take 1 tablet (25 mg total) by mouth 2 (two) times daily.     predniSONE 10 MG tablet  Commonly known as:  DELTASONE  Take 10 mg by mouth daily as needed. Take as directed as needed for gout flare ups     vitamin C 500 MG tablet  Commonly known as:  ASCORBIC ACID  Take 500 mg by mouth daily.       Follow-up Information   Follow  up with Sherryl Manges, MD. (Our office will call you for follow up appt.)    Contact information:   [address] [place] [address] [phone]     Time spent with patient to include physician time:35 minutes  Joni Reining 02/18/2013, 1:20 PM  Cardiology Attending Patient interviewed and examined. Discussed with Joni Reining, NP.  Above note annotated and modified based upon my findings.  [place] Bing, MD 02/18/2013, 3:26 PM

## 2013-03-02 ENCOUNTER — Ambulatory Visit (INDEPENDENT_AMBULATORY_CARE_PROVIDER_SITE_OTHER): Payer: Medicare Other | Admitting: *Deleted

## 2013-03-02 DIAGNOSIS — I498 Other specified cardiac arrhythmias: Secondary | ICD-10-CM

## 2013-03-02 DIAGNOSIS — R001 Bradycardia, unspecified: Secondary | ICD-10-CM

## 2013-03-02 LAB — PACEMAKER DEVICE OBSERVATION
AL IMPEDENCE PM: 475 Ohm
AL THRESHOLD: 0.5 V
RV LEAD AMPLITUDE: 11 mv
RV LEAD IMPEDENCE PM: 570 Ohm

## 2013-03-02 NOTE — Progress Notes (Signed)
Wound check-PPM 

## 2013-03-03 ENCOUNTER — Telehealth: Payer: Self-pay | Admitting: Internal Medicine

## 2013-03-03 NOTE — Telephone Encounter (Signed)
New problem    S/p pacemaker 2 weeks ago    Questions regarding medication . C/O dizzy for couples of days.

## 2013-03-03 NOTE — Telephone Encounter (Addendum)
Called patient's wife back. He is complaining of positional dizziness for the past few days in the AM. Had a permanent pacemaker implanted on 5/23. Advised her to have her husband get up slowly from lying to sitting to standing position. States that BP today was 146/91 with a heart rate of 70. Made appointment for him to see the PA on 6/10 at 830am.

## 2013-03-07 ENCOUNTER — Ambulatory Visit: Payer: Medicare Other | Admitting: Cardiology

## 2013-03-14 DIAGNOSIS — I619 Nontraumatic intracerebral hemorrhage, unspecified: Secondary | ICD-10-CM | POA: Diagnosis not present

## 2013-03-14 DIAGNOSIS — Z09 Encounter for follow-up examination after completed treatment for conditions other than malignant neoplasm: Secondary | ICD-10-CM | POA: Diagnosis not present

## 2013-03-14 DIAGNOSIS — S0990XA Unspecified injury of head, initial encounter: Secondary | ICD-10-CM | POA: Diagnosis not present

## 2013-03-22 ENCOUNTER — Encounter: Payer: Self-pay | Admitting: Internal Medicine

## 2013-04-03 ENCOUNTER — Telehealth: Payer: Self-pay | Admitting: Internal Medicine

## 2013-04-03 NOTE — Telephone Encounter (Signed)
Patient needed further instruction for now being 6 weeks post implant.  He was told not to lift more than 50 lbs with his left arm until his August visit and also it is ok for him to return to his water exercises.

## 2013-04-03 NOTE — Telephone Encounter (Signed)
New Prob     Pt has some questions regarding his pacemaker & what limitations he may or may not have.

## 2013-05-23 ENCOUNTER — Encounter: Payer: Self-pay | Admitting: Internal Medicine

## 2013-05-23 ENCOUNTER — Ambulatory Visit (INDEPENDENT_AMBULATORY_CARE_PROVIDER_SITE_OTHER): Payer: Medicare Other | Admitting: Internal Medicine

## 2013-05-23 VITALS — BP 115/68 | HR 60 | Ht 69.0 in | Wt 234.4 lb

## 2013-05-23 DIAGNOSIS — I4891 Unspecified atrial fibrillation: Secondary | ICD-10-CM | POA: Diagnosis not present

## 2013-05-23 DIAGNOSIS — I712 Thoracic aortic aneurysm, without rupture: Secondary | ICD-10-CM

## 2013-05-23 DIAGNOSIS — G4733 Obstructive sleep apnea (adult) (pediatric): Secondary | ICD-10-CM

## 2013-05-23 DIAGNOSIS — R42 Dizziness and giddiness: Secondary | ICD-10-CM | POA: Diagnosis not present

## 2013-05-23 LAB — PACEMAKER DEVICE OBSERVATION
AL AMPLITUDE: 2.1 mv
AL IMPEDENCE PM: 475 Ohm
ATRIAL PACING PM: 87.4
RV LEAD IMPEDENCE PM: 570 Ohm
VENTRICULAR PACING PM: 0.1

## 2013-05-23 MED ORDER — APIXABAN 5 MG PO TABS: 5.0000 mg | ORAL_TABLET | Freq: Two times a day (BID) | ORAL | Status: DC

## 2013-05-23 NOTE — Assessment & Plan Note (Signed)
No significant intercurrent atrial fibrillation. He is taking aspirin 2 times a day. I discussed with them the importance of anticoagulation in the use of NOACs. I have given him a prescription for apixaban which we he will try and get filled  at the [place].

## 2013-05-23 NOTE — Progress Notes (Signed)
Slightly dizzy and unsure.

## 2013-05-23 NOTE — Assessment & Plan Note (Addendum)
Will likely need reevaluation this fall;  With some reluctance we have stopped the beta blocker

## 2013-05-23 NOTE — Patient Instructions (Addendum)
Your physician wants you to follow-up in: 9 months with device clinic  Your physician has recommended you make the following change in your medication:  1) Stop Metoprolol 2) Start Eliquis 5mg  - take one tablet twice daily

## 2013-05-23 NOTE — Assessment & Plan Note (Signed)
With his fatigue I advised him to get his card read

## 2013-05-23 NOTE — Progress Notes (Signed)
Patient Care Team: Marden Noble, MD as PCP - General (Internal Medicine) Corky Crafts, MD (Cardiology)   HPI  Don Jacobs is a 77 y.o. male Seen following pacemaker implantation 5/14 for paroxysmal atrial fibrillation with a rapid rate associated with symptomatic post termination pausing and presyncope. He also has underlying bradycardia.  His CHADS-VASc score is 4, age-73, hypertension-1, and vascular disease- 1.   He takes aspirin because he does not want to take Coumadin. 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    He has orthostatic he has orthostatic dizziness he has a great deal of fatigue    Past Medical History  Diagnosis Date  . Hypertension   . A-fib   . Sleep apnea   . Gout   . Hypothyroidism   . Allergic rhinitis   . ED (erectile dysfunction)   . BPH (benign prostatic hyperplasia)   . Hearing loss     uses hearing a cyst  . Hx of migraines   . Prostatitis, chronic   . Spondylolisthesis 07/2010  . Seborrheic keratosis   . Aortic aneurysm, thoracic 05/25/2011  . Pacemaker     Past Surgical History  Procedure Laterality Date  . Back surgery    . L5 selective nerve root block      Dr Ethelene Hal  . Foot surgery Right ~ 2007    "reconstruction" (02/17/2013)  . Shoulder open rotator cuff repair Right 09/2007; 2013  . Repair peroneal tendons ankle Right 10/2003  . Knee arthroscopy Left ~ 2007  . Anterior cervical decomp/discectomy fusion  10/2000    "C4, 5, 6 w/fusion" (02/17/2013)  . Lumbar spine surgery  2012    "bone graft, Dr. Newell Coral" (02/17/2013)  . Insert / replace / remove pacemaker  02/17/2013  . Cardiac catheterization  2000's    "once" (02/17/2013)    Current Outpatient Prescriptions  Medication Sig Dispense Refill  . allopurinol (ZYLOPRIM) 300 MG tablet Take 300 mg by mouth at bedtime.       Marland Kitchen aspirin 81 MG tablet Take 81 mg by mouth daily.      . Calcium Carbonate (CALTRATE 600 PO) Take 1 capsule by mouth at bedtime.       .  cholecalciferol (VITAMIN D) 1000 UNITS tablet Take 1,000 Units by mouth at bedtime.      . colchicine 0.6 MG tablet Take 0.6 mg by mouth daily as needed. For gout      . doxazosin (CARDURA) 8 MG tablet Take 8 mg by mouth at bedtime.        Marland Kitchen levothyroxine (SYNTHROID, LEVOTHROID) 112 MCG tablet Take 112 mcg by mouth every evening.       . loratadine (CLARITIN) 10 MG tablet Take 10 mg by mouth as needed.       . metoprolol tartrate (LOPRESSOR) 25 MG tablet Take 1 tablet (25 mg total) by mouth 2 (two) times daily.  60 tablet  10  . Omega-3 Fatty Acids (FISH OIL) 600 MG CAPS Take 600 mg by mouth daily.       . predniSONE (DELTASONE) 10 MG tablet Take 10 mg by mouth daily as needed. Take as directed as needed for gout flare ups      . vitamin C (ASCORBIC ACID) 500 MG tablet Take 500 mg by mouth 2 (two) times daily.        No current facility-administered medications for this visit.    Allergies  Allergen Reactions  . Penicillins Shortness Of Breath and Rash  Ended up at ER after using  . Oxycodone     Review of Systems negative except from HPI and PMH  Physical Exam BP 115/68  Pulse 60  Ht 5\' 9"  (1.753 m)  Wt 234 lb 6.4 oz (106.323 kg)  BMI 34.6 kg/m2 Well developed and well nourished in no acute distress HENT normal E scleral and icterus clear Neck Supple JVP flat; carotids brisk and full Clear to ausculation Device pocket well healed; without hematoma or erythema.  There is no tethering Regular rate and rhythm, no murmurs gallops or rub Soft with active bowel sounds No clubbing cyanosis Trace Edema Alert and oriented, grossly normal motor and sensory function Skin Warm and Dry    Assessment and  Plan

## 2013-05-23 NOTE — Assessment & Plan Note (Signed)
I have taken the liberty of stopping his Lopressor. He has had infrequent atrial fibrillation. We will need to follow this patient along. I worry about his relatively low blood pressure in the 100-115 range

## 2013-05-23 NOTE — Progress Notes (Signed)
Slightly dizzy 

## 2013-05-25 ENCOUNTER — Other Ambulatory Visit: Payer: Self-pay | Admitting: *Deleted

## 2013-05-25 DIAGNOSIS — I712 Thoracic aortic aneurysm, without rupture: Secondary | ICD-10-CM

## 2013-05-30 ENCOUNTER — Other Ambulatory Visit: Payer: Self-pay | Admitting: *Deleted

## 2013-05-30 DIAGNOSIS — I712 Thoracic aortic aneurysm, without rupture: Secondary | ICD-10-CM

## 2013-06-06 ENCOUNTER — Encounter: Payer: Self-pay | Admitting: Internal Medicine

## 2013-06-14 ENCOUNTER — Other Ambulatory Visit: Payer: Medicare Other

## 2013-06-14 ENCOUNTER — Ambulatory Visit: Payer: Medicare Other | Admitting: Surgery

## 2013-06-19 ENCOUNTER — Other Ambulatory Visit: Payer: Self-pay | Admitting: *Deleted

## 2013-06-26 DIAGNOSIS — Z79899 Other long term (current) drug therapy: Secondary | ICD-10-CM | POA: Diagnosis not present

## 2013-06-28 ENCOUNTER — Other Ambulatory Visit: Payer: Self-pay | Admitting: *Deleted

## 2013-06-28 ENCOUNTER — Ambulatory Visit (HOSPITAL_COMMUNITY)
Admission: RE | Admit: 2013-06-28 | Discharge: 2013-06-28 | Disposition: A | Discharge status: Home / Self Care | Payer: Medicare Other | Source: Ambulatory Visit | Attending: Surgery | Admitting: Surgery

## 2013-06-28 ENCOUNTER — Ambulatory Visit: Payer: Medicare Other | Admitting: Surgery

## 2013-06-28 DIAGNOSIS — I712 Thoracic aortic aneurysm, without rupture: Secondary | ICD-10-CM

## 2013-07-11 ENCOUNTER — Other Ambulatory Visit: Payer: Self-pay | Admitting: *Deleted

## 2013-07-11 DIAGNOSIS — I712 Thoracic aortic aneurysm, without rupture: Secondary | ICD-10-CM

## 2013-07-17 DIAGNOSIS — I712 Thoracic aortic aneurysm, without rupture: Secondary | ICD-10-CM | POA: Diagnosis not present

## 2013-07-18 LAB — CREATININE, SERUM: Creat: 1.12 mg/dL (ref 0.50–1.35)

## 2013-07-19 ENCOUNTER — Encounter: Payer: Self-pay | Admitting: Surgery

## 2013-07-19 ENCOUNTER — Ambulatory Visit
Admission: RE | Admit: 2013-07-19 | Discharge: 2013-07-19 | Disposition: A | Discharge status: Home / Self Care | Payer: Medicare Other | Source: Ambulatory Visit | Attending: Surgery | Admitting: Surgery

## 2013-07-19 ENCOUNTER — Ambulatory Visit (INDEPENDENT_AMBULATORY_CARE_PROVIDER_SITE_OTHER): Payer: Medicare Other | Admitting: Surgery

## 2013-07-19 VITALS — BP 140/85 | HR 86 | Resp 20 | Ht 69.0 in | Wt 234.0 lb

## 2013-07-19 DIAGNOSIS — I712 Thoracic aortic aneurysm, without rupture: Secondary | ICD-10-CM

## 2013-07-19 MED ORDER — IOHEXOL 350 MG/ML SOLN
80.0000 mL | Freq: Once | INTRAVENOUS | Status: AC | PRN
  Administered 2013-07-19: 80 mL via INTRAVENOUS

## 2013-07-19 NOTE — Progress Notes (Signed)
[address]       [place] [address]             [phone]         HPI:  Patient returns today for followup of an ascending aortic aneurysm that was stable at 4.7 cm when I saw him in September 2013. He denies any chest pain or pressure. Since I last saw him he had a permanent pacemaker implanted for symptomatic bradycardia. He had been on a beta blocker but said that was discontinued due to hypotension.  Current Outpatient Prescriptions  Medication Sig Dispense Refill  . allopurinol (ZYLOPRIM) 300 MG tablet Take 300 mg by mouth at bedtime.       Marland Kitchen aspirin 81 MG tablet Take 81 mg by mouth daily.      . Calcium Carbonate (CALTRATE 600 PO) Take 1 capsule by mouth at bedtime.       . cholecalciferol (VITAMIN D) 1000 UNITS tablet Take 1,000 Units by mouth at bedtime.      . colchicine 0.6 MG tablet Take 0.6 mg by mouth daily as needed. For gout      . doxazosin (CARDURA) 8 MG tablet Take 8 mg by mouth at bedtime.        Marland Kitchen levothyroxine (SYNTHROID, LEVOTHROID) 112 MCG tablet Take 112 mcg by mouth every evening.       . loratadine (CLARITIN) 10 MG tablet Take 10 mg by mouth as needed.       . Omega-3 Fatty Acids (FISH OIL) 600 MG CAPS Take 600 mg by mouth daily.       . predniSONE (DELTASONE) 10 MG tablet Take 10 mg by mouth daily as needed. Take as directed as needed for gout flare ups      . vitamin C (ASCORBIC ACID) 500 MG tablet Take 500 mg by mouth 2 (two) times daily.        No current facility-administered medications for this visit.     Physical Exam:  BP 140/85  Pulse 86  Resp 20  Ht 5\' 9"  (1.753 m)  Wt 234 lb (106.142 kg)  BMI 34.54 kg/m2  SpO2 97% He looks well. Cardiac exam shows a regular rate and rhythm with normal S1 and S2. There is no murmur, rub, or gallop. Lung exam is clear.  Diagnostic Tests:  CLINICAL DATA: Aneurysmal disease of the thoracic aorta.  EXAM:  CT ANGIOGRAPHY CHEST WITH CONTRAST  TECHNIQUE:  Multidetector CT  imaging of the chest was performed using the  standard protocol during bolus administration of intravenous  contrast. Multiplanar CT image reconstructions including MIPs were  obtained to evaluate the vascular anatomy.  CONTRAST: 80mL OMNIPAQUE IOHEXOL 350 MG/ML SOLN  COMPARISON: MRA studies on 05/27/2012 and 04/22/2011 and prior CTA  of the chest on 09/24/2010.  FINDINGS:  There is stable dilatation of the aortic root and proximal ascending  thoracic aorta with maximal diameter of 4.6-4.7 cm. This is stable  since prior imaging. Aneurysmal disease does not involve the sinuses  of Valsalva. There is no associated dissection, evidence of  intramural hemorrhage or penetrating ulcer disease.  The proximal arch measures 3.9 cm, the distal arch measures 3.6 cm  and the descending thoracic aorta measures 3.0 cm. Proximal great  vessels show stable branching anatomy and normal patency. No  mediastinal hemorrhage or abnormal fluid collections are identified.  Calcified plaque is again noted in the distribution of the left  anterior descending coronary artery and  left circumflex coronary  artery. A pacemaker is present. No pleural or pericardial fluid is  identified. The heart is mildly enlarged.  No masses, pulmonary nodules or enlarged lymph nodes are seen.  Visualized upper liver shows steatosis and potentially early  cirrhotic changes. Degenerative changes are present in the thoracic  spine.  Review of the MIP images confirms the above findings.  IMPRESSION:  Stable aneurysmal disease of the ascending thoracic aorta with  maximal diameter of 4.6-4.7 cm.  Electronically Signed  By: Irish Lack M.D.  On: 07/19/2013 15:21    Impression:  He has a stable ascending aortic aneurysm measuring 4.6-4.7 cm. I stressed the importance of good blood pressure control.  Plan:  I will see him back in one year with a CT angiogram of the aorta to followup on his aneurysm

## 2013-07-27 DIAGNOSIS — M25469 Effusion, unspecified knee: Secondary | ICD-10-CM | POA: Diagnosis not present

## 2013-08-07 DIAGNOSIS — M25469 Effusion, unspecified knee: Secondary | ICD-10-CM | POA: Diagnosis not present

## 2013-08-07 DIAGNOSIS — M109 Gout, unspecified: Secondary | ICD-10-CM | POA: Diagnosis not present

## 2013-08-07 DIAGNOSIS — E039 Hypothyroidism, unspecified: Secondary | ICD-10-CM | POA: Diagnosis not present

## 2013-08-07 DIAGNOSIS — I10 Essential (primary) hypertension: Secondary | ICD-10-CM | POA: Diagnosis not present

## 2013-08-07 DIAGNOSIS — I4891 Unspecified atrial fibrillation: Secondary | ICD-10-CM | POA: Diagnosis not present

## 2013-08-07 DIAGNOSIS — N400 Enlarged prostate without lower urinary tract symptoms: Secondary | ICD-10-CM | POA: Diagnosis not present

## 2013-08-07 DIAGNOSIS — D126 Benign neoplasm of colon, unspecified: Secondary | ICD-10-CM | POA: Diagnosis not present

## 2013-08-07 DIAGNOSIS — Z1331 Encounter for screening for depression: Secondary | ICD-10-CM | POA: Diagnosis not present

## 2013-08-07 DIAGNOSIS — Z0000 Encounter for general adult medical examination without abnormal findings: Secondary | ICD-10-CM | POA: Diagnosis not present

## 2013-08-07 DIAGNOSIS — I629 Nontraumatic intracranial hemorrhage, unspecified: Secondary | ICD-10-CM | POA: Diagnosis not present

## 2013-08-07 DIAGNOSIS — R350 Frequency of micturition: Secondary | ICD-10-CM | POA: Diagnosis not present

## 2013-08-07 DIAGNOSIS — Z23 Encounter for immunization: Secondary | ICD-10-CM | POA: Diagnosis not present

## 2013-09-15 ENCOUNTER — Telehealth: Payer: Self-pay | Admitting: Internal Medicine

## 2013-09-15 NOTE — Telephone Encounter (Signed)
Gunnar Fusi /Ep has spoken with pt/ wife for a transmission.

## 2013-09-15 NOTE — Telephone Encounter (Signed)
New Message  Husband has a pacer --- Pulse goes High from time to time.. After sleeping he experienced S.O.B// he took his pressure it was 116/76 Heart rate 146. Please call back to to discuss.

## 2013-09-15 NOTE — Telephone Encounter (Signed)
Will forward to device 

## 2013-09-15 NOTE — Telephone Encounter (Signed)
Spoke with wife and instructed her how to send the transmission and also if patient continues over the weekend to have increased heart rates to go to the ER. No transmission noted in [facility] at this time.

## 2013-09-15 NOTE — Telephone Encounter (Signed)
New message  Pt wife called.. Pt requests a call back about soda water for leg cramps... She believes that it has made her husbands heart race and she requests a call back to discuss. Pt requests that this message is seen by Riki Rusk as well

## 2013-09-18 NOTE — Telephone Encounter (Signed)
Transmission was never received. Patient states that he might have a digital telephone. A filter will be sent to patient's home. He will send the transmission upon arrival of the filter. Patient voiced understanding.

## 2013-09-18 NOTE — Telephone Encounter (Signed)
lmtrc

## 2013-09-18 NOTE — Telephone Encounter (Signed)
Agree with Jeremy's recs 

## 2013-09-18 NOTE — Telephone Encounter (Signed)
Advised patient that quinine which is often in tonic water can cause irregular heart rhythm in some patients, and I would advise avoiding the tonic water for at least the next week or two to see if any improvement in heart rate.

## 2013-09-18 NOTE — Telephone Encounter (Signed)
Pt is aware.  

## 2013-09-20 ENCOUNTER — Telehealth: Payer: Self-pay | Admitting: Internal Medicine

## 2013-09-20 NOTE — Telephone Encounter (Signed)
Spoke with patient-states he is doing better since getting a new mask and would like to keep pressure at 10. Pt also scheduled a follow up with CY on Monday 09-25-13 at 2:30pm for OSA as its been 2 years since he has been seen. Nothing more needed at this time.

## 2013-09-20 NOTE — Telephone Encounter (Signed)
Will route back to [facility] since she has the compliance report.

## 2013-09-20 NOTE — Telephone Encounter (Signed)
Pt is returning [name]'s call.  Don Jacobs

## 2013-09-25 ENCOUNTER — Ambulatory Visit (INDEPENDENT_AMBULATORY_CARE_PROVIDER_SITE_OTHER): Payer: Medicare Other | Admitting: Internal Medicine

## 2013-09-25 ENCOUNTER — Encounter: Payer: Self-pay | Admitting: Internal Medicine

## 2013-09-25 VITALS — BP 122/64 | HR 75 | Ht 69.0 in | Wt 240.8 lb

## 2013-09-25 DIAGNOSIS — G4733 Obstructive sleep apnea (adult) (pediatric): Secondary | ICD-10-CM | POA: Diagnosis not present

## 2013-09-25 NOTE — Progress Notes (Signed)
Patient ID: Don Jacobs, male    DOB: December 10, 1935, 77 y.o.   MRN: [id number] HPI 03/12/11- 59 yoM seen for Dr Newell Coral because of hypoxia and sleep apnea.   Diagnosed obstructive sleep apnea with NPSG 8//2/88 RDI/ AHI  30.6/hr. Last here in 2006. He had done well on CPAP 8.5/ [facility], but had gained weight and was waking tired recently. Had back surgery January 26, 2011. After surgery his oxygen saturation fell despite CPAP and he was given supplemental O2.  He quit smoking 30 years ago and denies hx of lung disease. Has an incidental cold this week, but denies routine cough or wheeze. Remote broken nose, no ENT surgery, Doesn't think he snores though his mask.   Atrial Fib dx;d January, 2012- now on coumadin. Treated hypothyroidism.    04/14/11-  81 yoM former smoker seen for Dr Newell Coral because of hypoxia and sleep apnea.   Continues CPAP all night every night as he has for years and can't sleep without it. He thinks the pressure change up to 10 ([facility])  has helped.   ONOX on RA/CPAP 03/29/11 recorded 12 minutes with sat <88%, which I reviewed with him. He felt better after the increase to 10. We agreed that rather than adding an O2 concentrator, we would see how he does with one more pressure increase.,  >> CPAP 12  09/25/13- 77 yoM former smoker seen for Dr Newell Coral because of hypoxia and sleep apnea, complicated by AFib, HBP, hypothyroid FOLLOWS FOR: Sudden onset of snoring even with CPAP 10 machine-went to [facility] for new mask; they checked machine and sent report to [place].  Download/[facility]-good compliance and control on 10 CWP. He reports starting to snore and feel tired so we discussed increase to 12  ROS-see HPI Constitutional:   No-   weight loss, night sweats, fevers, chills, +fatigue, lassitude. HEENT:   No-  headaches, difficulty swallowing, tooth/dental problems, sore throat,       No-  sneezing, itching, ear ache, nasal congestion, post nasal drip,  CV:  No-   chest pain, orthopnea,  PND, swelling in lower extremities, anasarca, dizziness, palpitations Resp: No-   shortness of breath with exertion or at rest.              No-   productive cough,  No non-productive cough,  No- coughing up of blood.              No-   change in color of mucus.  No- wheezing.   Skin: No-   rash or lesions. GI:  No-   heartburn, indigestion, abdominal pain, nausea, vomiting,  GU:  MS:  No-   joint pain or swelling.   Neuro-     nothing unusual Psych:  No- change in mood or affect. No depression or anxiety.  No memory loss.  OBJ- Physical Exam General- Alert, Oriented, Affect-appropriate, Distress- none acute, overweight Skin- rash-none, lesions- none, excoriation- none Lymphadenopathy- none Head- atraumatic            Eyes- Gross vision intact, PERRLA, conjunctivae and secretions clear            Ears- Hearing, canals-normal            Nose- Clear, no-Septal dev, mucus, polyps, erosion, perforation             Throat- Mallampati IV , mucosa clear , drainage- none, tonsils- atrophic. dentures Neck- flexible , trachea midline, no stridor , thyroid nl, carotid no bruit Chest - symmetrical  excursion , unlabored           Heart/CV- RRR , no murmur , no gallop  , no rub, nl s1 s2                           - JVD- none , edema- none, stasis changes- none, varices- none           Lung- clear to P&A, wheeze- none, cough- none , dullness-none, rub- none           Chest wall-  L pacemaker Abd-  Br/ Gen/ Rectal- Not done, not indicated Extrem- cyanosis- none, clubbing, none, atrophy- none, strength- nl Neuro- grossly intact to observation

## 2013-09-25 NOTE — Patient Instructions (Signed)
Order- DME [facility]- increase CPAP pressure to 12   Dx OSA  Please call as needed

## 2013-09-27 ENCOUNTER — Telehealth: Payer: Self-pay | Admitting: Interventional Cardiology

## 2013-09-27 MED ORDER — DILTIAZEM HCL ER COATED BEADS 120 MG PO CP24: 120.0000 mg | ORAL_CAPSULE | Freq: Every day | ORAL | Status: DC

## 2013-09-27 NOTE — Telephone Encounter (Signed)
Spoke with pt and he woke up at am with Sob (he is not sure if that is why he woke up). Pt checked his BP and it was 127/82 with a HR of 75 pm. Pt states his HR was irregular; pt could feel his heart skipping until 8:30 am and then it stopped. Pt also had an episode last week where his HR went up to 145bpm and it lasted 45 minutes. Pt is feeling okay now and he feels his rhythm is normal.

## 2013-09-27 NOTE — Telephone Encounter (Signed)
Spoke with pts wife and pt had problems with metoprolol causing hypotension. Pt will try Diltiazem 120mg , rx sent into pharmacy. Pt will call is symptoms persist.

## 2013-09-27 NOTE — Addendum Note (Signed)
Addended byOrlene Plum H on: 09/27/2013 02:50 PM   Modules accepted: Orders

## 2013-09-27 NOTE — Telephone Encounter (Signed)
Could try metoprolol 25 mg BID or Diltiazem 120 mg daily if he had side effects on metoprolol in the past.

## 2013-09-27 NOTE — Telephone Encounter (Signed)
New message    C/o irregular heart beat and pulse

## 2013-09-29 ENCOUNTER — Telehealth: Payer: Self-pay | Admitting: Internal Medicine

## 2013-09-29 NOTE — Telephone Encounter (Signed)
Spoke w/pt in regards to sending transmissions. Pt understands.

## 2013-09-29 NOTE — Telephone Encounter (Signed)
New problem    Pt needs a call back about not being able to transmit.  Pt had work done on phone today phone line jack replaced Today.  Pt has questions about his device and how often.

## 2013-09-30 ENCOUNTER — Telehealth: Payer: Self-pay | Admitting: Cardiology

## 2013-09-30 NOTE — Telephone Encounter (Signed)
Patient's wife called stating that he had another episode of SOB with irregularity to his pulse.  His SBP was 11mmHg and his pulse was 78bpm.  She says that his pulse skips about 3 times every minute.  The patient states that he is feeling better now and SOB has resolved.  He did take his Cardizem this am which was prescribed 3 days ago.  I instructed patient that if he has any further episodes of SOB with irregular pulse this weekend to go to the ER otherwise he needs to call to see Dr. Irish Lack on Monday

## 2013-10-02 ENCOUNTER — Telehealth: Payer: Self-pay | Admitting: Interventional Cardiology

## 2013-10-02 NOTE — Telephone Encounter (Signed)
Spoke with pts wife and pt is feeling much better today. Pt has been on the diltiazem since Thursday.Saturday night was the first episode pt had since Thursday, but pt did have some dizziness yesterday morning (pt went back to bed and when he got up he felt better). Pt has follow up aptt 10/11/13.

## 2013-10-02 NOTE — Telephone Encounter (Signed)
New message  Patient spoke with Dr Radford Pax Saturday, he felt if his heart was going crazy. Dr Radford Pax told him to follow up with Dr Irish Lack. I made appt for next Wednesday, she would like to speak with you.

## 2013-10-11 ENCOUNTER — Ambulatory Visit: Payer: Medicare Other | Admitting: Interventional Cardiology

## 2013-10-13 ENCOUNTER — Ambulatory Visit (INDEPENDENT_AMBULATORY_CARE_PROVIDER_SITE_OTHER): Payer: Medicare Other | Admitting: Interventional Cardiology

## 2013-10-13 ENCOUNTER — Encounter: Payer: Self-pay | Admitting: Interventional Cardiology

## 2013-10-13 VITALS — BP 122/64 | HR 68 | Ht 69.0 in | Wt 239.0 lb

## 2013-10-13 DIAGNOSIS — I4891 Unspecified atrial fibrillation: Secondary | ICD-10-CM | POA: Diagnosis not present

## 2013-10-13 MED ORDER — DILTIAZEM HCL ER COATED BEADS 120 MG PO CP24: 120.0000 mg | ORAL_CAPSULE | Freq: Every day | ORAL | Status: DC

## 2013-10-13 NOTE — Progress Notes (Signed)
Patient ID: BURR SOFFER, male   DOB: [birth year], 78 y.o.   MRN: [id number]    [place], [place], [place]  [address] Phone: [phone] Fax:  [phone]  Date:  10/13/2013   ID:  Don Jacobs, DOB 05-23-1936, MRN [id number]  PCP:  Henrine Screws, MD      History of Present Illness: Don Jacobs is a 78 y.o. male  who has had PAF and was on Coumadin. He had a fall and an ICH affecting the right temporal lobe. He was admitted to [facility]. While there, he had significant bradycardia with HR dropping to as low as 29 bpm. The wife states that IV Dopamine was used. His metoprolol and Amiodarone were stopped.   Since the pacemaker, he has done well. Around Christmas, he started noticing palpitations. His metoprolol had been stopped due to borderline blood pressure. He had another episode on New Year's with heart rate in the 120s by blood pressure monitor. He called the on-call doctor and diltiazem was called in. After starting the diltiazem, he has not had any tachypalpitations.       Wt Readings from Last 3 Encounters:  10/13/13 239 lb (108.41 kg)  09/25/13 240 lb 12.8 oz (109.226 kg)  07/19/13 234 lb (106.142 kg)     Past Medical History  Diagnosis Date  . Hypertension   . A-fib   . Sleep apnea   . Gout   . Hypothyroidism   . Allergic rhinitis   . ED (erectile dysfunction)   . BPH (benign prostatic hyperplasia)   . Hearing loss     uses hearing a cyst  . Hx of migraines   . Prostatitis, chronic   . Spondylolisthesis 07/2010  . Seborrheic keratosis   . Aortic aneurysm, thoracic 05/25/2011  . Pacemaker     Current Outpatient Prescriptions  Medication Sig Dispense Refill  . allopurinol (ZYLOPRIM) 300 MG tablet Take 300 mg by mouth at bedtime.       Marland Kitchen aspirin 325 MG tablet Take 325 mg by mouth daily.      . Calcium Carbonate (CALTRATE 600 PO) Take 1 capsule by mouth at bedtime.       . cholecalciferol (VITAMIN D) 1000 UNITS tablet Take 1,000  Units by mouth at bedtime.      . colchicine 0.6 MG tablet Take 0.6 mg by mouth daily as needed. For gout      . diltiazem (CARDIZEM CD) 120 MG 24 hr capsule Take 1 capsule (120 mg total) by mouth daily.  30 capsule  6  . doxazosin (CARDURA) 8 MG tablet Take 8 mg by mouth at bedtime.        Marland Kitchen levothyroxine (SYNTHROID, LEVOTHROID) 112 MCG tablet Take 112 mcg by mouth every evening.       . loratadine (CLARITIN) 10 MG tablet Take 10 mg by mouth as needed.       . predniSONE (DELTASONE) 10 MG tablet Take 10 mg by mouth daily as needed. Take as directed as needed for gout flare ups      . vitamin C (ASCORBIC ACID) 500 MG tablet Take 500 mg by mouth daily.        No current facility-administered medications for this visit.    Allergies:    Allergies  Allergen Reactions  . Penicillins Shortness Of Breath and Rash    Ended up at ER after using  . Oxycodone     Social History:  The patient  reports that he quit smoking about 32 years ago. His smoking use included Cigarettes. He has a 12 pack-year smoking history. He has never used smokeless tobacco. He reports that he does not drink alcohol or use illicit drugs.   Family History:  The patient's family history includes Heart disease in his father.   ROS:  Please see the history of present illness.  No nausea, vomiting.  No fevers, chills.  No focal weakness.  No dysuria.    All other systems reviewed and negative.   PHYSICAL EXAM: VS:  BP 122/64  Pulse 68  Ht 5\' 9"  (1.753 m)  Wt 239 lb (108.41 kg)  BMI 35.28 kg/m2 Well nourished, well developed, in no acute distress HEENT: normal Neck: no JVD, no carotid bruits Cardiac:  normal S1, S2; RRR; 2/6 systolic murmur Lungs:  clear to auscultation bilaterally, no wheezing, rhonchi or rales Abd: soft, nontender, no hepatomegaly Ext: no edema Skin: warm and dry Neuro:   no focal abnormalities noted  EKG:  Atrial paced , narrow QRS, NSST   ASSESSMENT AND PLAN:  A-fib   symptomatic atrial  fibrillation. Much improved since starting diltiazem. Will continue this medication as it is controlling blood pressure as well as heart rate. Prescriptions since his local pharmacy. Written prescription given to him to take to the [place]. I think he should be on anticoagulation for stroke prevention.  2. Aortic aneurysm of unspecified site without mention of rupture  Notes: Follows with Dr. Cyndia Bent. This has been stable.  3. Anticoagulant monitoring  Notes: Review of records from [facility] show that coumadin was to be held for 6-8 weeks. Neurosurgery okayed him going back on anticoagulation. He has been hesitant. He does not want to try one of the novel agents since they will be expensive as they're not covered by the [place]. He does not want to go back on Coumadin due to dietary restrictions. We did discuss at length the importance of stroke prevention. His chads Vasc score is 3. He was certainly benefit from anticoagulation. It is difficult since he did have an intracranial hemorrhage. I think a novel oral anticoagulant would be ideal but would be very expensive for him. He will consider his options and get back to [place]. He will continue aspirin at the current time.    Signed, Mina Marble, MD, [facility] 10/13/2013 4:41 PM

## 2013-10-13 NOTE — Patient Instructions (Signed)
Your physician wants you to follow-up in: 4 months with Dr. Irish Lack. You will receive a reminder letter in the mail two months in advance. If you don't receive a letter, please call our office to schedule the follow-up appointment.  Your physician recommends that you continue on your current medications as directed. Please refer to the Current Medication list given to you today.

## 2013-10-15 ENCOUNTER — Encounter: Payer: Self-pay | Admitting: Internal Medicine

## 2013-10-15 NOTE — Assessment & Plan Note (Signed)
Rate compliance and control but he would like to try a higher pressure Plan-DME/[facility] increase CPAP to 12 for trial

## 2013-10-31 DIAGNOSIS — M2240 Chondromalacia patellae, unspecified knee: Secondary | ICD-10-CM | POA: Diagnosis not present

## 2013-10-31 DIAGNOSIS — M23302 Other meniscus derangements, unspecified lateral meniscus, unspecified knee: Secondary | ICD-10-CM | POA: Diagnosis not present

## 2013-10-31 DIAGNOSIS — M1710 Unilateral primary osteoarthritis, unspecified knee: Secondary | ICD-10-CM | POA: Diagnosis not present

## 2013-10-31 DIAGNOSIS — M6580 Other synovitis and tenosynovitis, unspecified site: Secondary | ICD-10-CM | POA: Diagnosis not present

## 2013-11-08 DIAGNOSIS — K5730 Diverticulosis of large intestine without perforation or abscess without bleeding: Secondary | ICD-10-CM | POA: Diagnosis not present

## 2013-11-08 DIAGNOSIS — Z09 Encounter for follow-up examination after completed treatment for conditions other than malignant neoplasm: Secondary | ICD-10-CM | POA: Diagnosis not present

## 2013-11-08 DIAGNOSIS — Z86010 Personal history of colonic polyps: Secondary | ICD-10-CM | POA: Diagnosis not present

## 2013-12-21 DIAGNOSIS — M1710 Unilateral primary osteoarthritis, unspecified knee: Secondary | ICD-10-CM | POA: Diagnosis not present

## 2014-01-04 DIAGNOSIS — R29898 Other symptoms and signs involving the musculoskeletal system: Secondary | ICD-10-CM | POA: Diagnosis not present

## 2014-01-04 DIAGNOSIS — R350 Frequency of micturition: Secondary | ICD-10-CM | POA: Diagnosis not present

## 2014-01-04 DIAGNOSIS — R42 Dizziness and giddiness: Secondary | ICD-10-CM | POA: Diagnosis not present

## 2014-01-11 ENCOUNTER — Telehealth: Payer: Self-pay | Admitting: Interventional Cardiology

## 2014-01-11 MED ORDER — DILTIAZEM HCL ER COATED BEADS 120 MG PO CP24: 120.0000 mg | ORAL_CAPSULE | Freq: Every day | ORAL | Status: DC

## 2014-01-11 NOTE — Telephone Encounter (Signed)
OV and Rx printed to sign.

## 2014-01-11 NOTE — Telephone Encounter (Signed)
New message      Want doctor to mail to patient a presc for his diltiazem and his last ov note so that the [place] can fill this.

## 2014-01-11 NOTE — Telephone Encounter (Signed)
Mailed to pt. Pts wife aware.

## 2014-01-22 ENCOUNTER — Telehealth: Payer: Self-pay | Admitting: Interventional Cardiology

## 2014-01-22 DIAGNOSIS — R29898 Other symptoms and signs involving the musculoskeletal system: Secondary | ICD-10-CM

## 2014-01-22 DIAGNOSIS — R609 Edema, unspecified: Secondary | ICD-10-CM

## 2014-01-22 NOTE — Telephone Encounter (Deleted)
Spoke with Don Jacobs and he is not currently in distress. Don Jacobs states he had some afib this am. Don Jacobs states he has had weakness for the past several weeks and bloodwork done by Dr. Inda Merlin showed everything was normal. Don Jacobs doesn't really know what his heart rate is. Don Jacobs is currently feeling okay but his legs are weak. He is staying hydrated and his BP today was 120/70's, but yesterday BP spiked 150's/80's. Since then BP went down. Don Jacobs state he feels bloated in his stomach and he has some swelling in his ankles. Don Jacobs has sob when he has afib and when he exerts himself.

## 2014-01-22 NOTE — Telephone Encounter (Signed)
Patient reports he did have episode of afib today, he got a little SOB and his wife (retired Marine scientist) felt his pulse and it was irregular. He states he is usually not in afib.   He wants to see Dr. Irish Lack sooner due to weakness that he feels in his legs lately after walking just short distances. He denies swelling currently, "my feet swell a little sometimes, but I feel kind of bloated right now" His BP today was 120/70, no HR reported. Advised I will forward the message to Dr. Irish Lack and his CMA to see if a sooner appointment may be possible, but that his schedule is full until his upcoming appointment later in May. He states that currently he does not feel this warrants an ED visit but that if he would feel worse he will go in for eval.

## 2014-01-22 NOTE — Telephone Encounter (Signed)
To Triage.

## 2014-01-22 NOTE — Telephone Encounter (Signed)
New Message:  Pt is c/o afib and his heart rate is up. States he does not have any values to give as examples... States he is weak in the legs when he walks. Pt states he has SOB that comes and goes with activity.. Pt is requesting to be seen sooner by Dr. Irish Lack than 5/19.Don KitchenMarland Jacobs

## 2014-01-23 DIAGNOSIS — M1710 Unilateral primary osteoarthritis, unspecified knee: Secondary | ICD-10-CM | POA: Diagnosis not present

## 2014-01-23 NOTE — Telephone Encounter (Signed)
Pts wife notified. ABI will be scheduled. Pts wife thinks pt is only taking aspirin 81 mg 2 tablets daily because the full 325 mg aspirin caused pt to bruise easily. Pt is feeling better today and BP is okay. Pt still feels fatigued and weak in his legs.

## 2014-01-23 NOTE — Telephone Encounter (Signed)
See below

## 2014-01-23 NOTE — Telephone Encounter (Signed)
Please set him up for ABIs to evaluate lower extremity circulation.

## 2014-01-24 ENCOUNTER — Ambulatory Visit (INDEPENDENT_AMBULATORY_CARE_PROVIDER_SITE_OTHER): Payer: Medicare Other | Admitting: Interventional Cardiology

## 2014-01-24 ENCOUNTER — Encounter: Payer: Self-pay | Admitting: Interventional Cardiology

## 2014-01-24 VITALS — BP 136/62 | HR 147 | Ht 69.0 in | Wt 230.0 lb

## 2014-01-24 DIAGNOSIS — I712 Thoracic aortic aneurysm, without rupture, unspecified: Secondary | ICD-10-CM

## 2014-01-24 DIAGNOSIS — I4891 Unspecified atrial fibrillation: Secondary | ICD-10-CM | POA: Diagnosis not present

## 2014-01-24 DIAGNOSIS — I4892 Unspecified atrial flutter: Secondary | ICD-10-CM | POA: Diagnosis not present

## 2014-01-24 DIAGNOSIS — I10 Essential (primary) hypertension: Secondary | ICD-10-CM | POA: Diagnosis not present

## 2014-01-24 MED ORDER — APIXABAN 5 MG PO TABS: 5.0000 mg | ORAL_TABLET | Freq: Two times a day (BID) | ORAL | Status: DC

## 2014-01-24 MED ORDER — DILTIAZEM HCL ER COATED BEADS 240 MG PO CP24: 240.0000 mg | ORAL_CAPSULE | Freq: Every day | ORAL | Status: DC

## 2014-01-24 NOTE — Telephone Encounter (Signed)
Follow up     Pt very dizzy this am and weak in his legs. B/P this am is 127/95 pulse 91 at 7:15.  Checked pulse again at 8:15 and it was 100; at 10:30 pulse is 136. Please advise

## 2014-01-24 NOTE — Telephone Encounter (Signed)
Awaiting Dr. Hassell Done review.

## 2014-01-24 NOTE — Telephone Encounter (Addendum)
Pts HR is currently 140 bpm. Appt made for pt to come to our office today. He did not want to go to urgent care.

## 2014-01-24 NOTE — Patient Instructions (Addendum)
Your physician has requested that you have an echocardiogram in 1-2 weeks as long as heart rate is controlled. Echocardiography is a painless test that uses sound waves to create images of your heart. It provides your doctor with information about the size and shape of your heart and how well your heart's chambers and valves are working. This procedure takes approximately one hour. There are no restrictions for this procedure.  Your physician has recommended you make the following change in your medication:   1. Start Eliquis 5 mg 1 tablet twice a day.  2. Increase Diltiazem to 240 mg daily.   STOP ASPIRIN.  Your physician recommends that you schedule a follow-up appointment in: on Firday Jan 26, 2014 for EKG while you are here for ABI.

## 2014-01-24 NOTE — Progress Notes (Signed)
Patient ID: Don Jacobs, male   DOB: [birth year], 78 y.o.   MRN: [id number]    [place], [place] [place], [place]  [address] Phone: [phone] Fax:  [phone]  Date:  01/24/2014   ID:  Don Jacobs, DOB Jan 31, 1936, MRN [id number]  PCP:  Henrine Screws, MD      History of Present Illness: Don Jacobs is a 78 y.o. male who has had PAF and was on Coumadin. He had a fall and an ICH affecting the right temporal lobe. He was admitted to [facility]. While there, he had significant bradycardia with HR dropping to as low as 29 bpm.  He eventually had a pacemaker placed.  Although it was thought to be safe to go back on anticoagulation from his neurosurgeon, he did not want to take this medicine.  Over the past few weeks, he has had leg weakness. He has had intermittent palpitations and fatigue. Today, his heart rate was in the 130s by monitor. He comes in for evaluation. He has not had chest pain, lightheadedness or syncope. His blood pressure has been stable. He just feels tired and if he tries to do any activity, he gets tired very easily.    Wt Readings from Last 3 Encounters:  01/24/14 230 lb (104.327 kg)  10/13/13 239 lb (108.41 kg)  09/25/13 240 lb 12.8 oz (109.226 kg)     Past Medical History  Diagnosis Date  . Hypertension   . A-fib   . Sleep apnea   . Gout   . Hypothyroidism   . Allergic rhinitis   . ED (erectile dysfunction)   . BPH (benign prostatic hyperplasia)   . Hearing loss     uses hearing a cyst  . Hx of migraines   . Prostatitis, chronic   . Spondylolisthesis 07/2010  . Seborrheic keratosis   . Aortic aneurysm, thoracic 05/25/2011  . Pacemaker     Current Outpatient Prescriptions  Medication Sig Dispense Refill  . allopurinol (ZYLOPRIM) 300 MG tablet Take 300 mg by mouth at bedtime.       Marland Kitchen aspirin 325 MG tablet Take 325 mg by mouth daily.      . Calcium Carbonate (CALTRATE 600 PO) Take 1 capsule by mouth at bedtime.       .  cholecalciferol (VITAMIN D) 1000 UNITS tablet Take 1,000 Units by mouth at bedtime.      . colchicine 0.6 MG tablet Take 0.6 mg by mouth daily as needed. For gout      . diltiazem (CARDIZEM CD) 120 MG 24 hr capsule Take 1 capsule (120 mg total) by mouth daily.  90 capsule  3  . doxazosin (CARDURA) 8 MG tablet Take 8 mg by mouth at bedtime.        Marland Kitchen levothyroxine (SYNTHROID, LEVOTHROID) 112 MCG tablet Take 112 mcg by mouth every evening.       . loratadine (CLARITIN) 10 MG tablet Take 10 mg by mouth as needed.       . predniSONE (DELTASONE) 10 MG tablet Take 10 mg by mouth daily as needed. Take as directed as needed for gout flare ups      . vitamin C (ASCORBIC ACID) 500 MG tablet Take 500 mg by mouth daily.        No current facility-administered medications for this visit.    Allergies:    Allergies  Allergen Reactions  . Penicillins Shortness Of Breath and Rash    Ended up  at ER after using  . Oxycodone     Social History:  The patient  reports that he quit smoking about 32 years ago. His smoking use included Cigarettes. He has a 12 pack-year smoking history. He has never used smokeless tobacco. He reports that he does not drink alcohol or use illicit drugs.   Family History:  The patient's family history includes Heart disease in his father.   ROS:  Please see the history of present illness.  No nausea, vomiting.  No fevers, chills.  No focal weakness.  No dysuria. Fatigue.   All other systems reviewed and negative.   PHYSICAL EXAM: VS:  BP 136/62  Pulse 147  Ht 5\' 9"  (1.753 m)  Wt 230 lb (104.327 kg)  BMI 33.95 kg/m2 Well nourished, well developed, in no acute distress HEENT: normal Neck: no JVD, no carotid bruits Cardiac:  normal S1, S2; regular, tachycardic Lungs:  clear to auscultation bilaterally, no wheezing, rhonchi or rales Abd: soft, nontender, no hepatomegaly Ext: no edema Skin: warm and dry Neuro:   no focal abnormalities noted  EKG:  Atrial flutter 2:1 block      ASSESSMENT AND PLAN:  1. Atrial flutter:  Stop aspirin, start Eliquis 5 mg BID; Increase diltiazem to 240 mg daily. Echo when HR slower.  ECG Friday.  Hopefully, he'll convert on his own. If he does not, We will have to consider either TEE cardioversion or TEE ablation.  Will repeat ECG on Friday. If he is still in atrial flutter, will discuss with electrophysiologist. 2. Aortic aneurysm of unspecified site without mention of rupture  Notes: Follows with Dr. Cyndia Bent. This has been stable.  3. Anticoagulant monitoring  Notes: Review of records from [facility] show that coumadin was to be held for 6-8 weeks. Neurosurgery okayed him going back on anticoagulation. He has been hesitant in the past. He did not want to try one of the novel agents since they will be expensive as they're not covered by the [place]. He did not want to go back on Coumadin due to dietary restrictions.   Today, We did discuss at length the importance of stroke prevention and the need for anticoagulation for him to have any intervention done on this atrial flutter. His chads Vasc score is 3. He will certainly benefit from anticoagulation. It is difficult since he did have an intracranial hemorrhage. I think a novel oral anticoagulant would be ideal to avoid having Coumadin go in the supratherapeutic range. Samples of Eliquis were given to the patient. He he will try to fill the prescription at the [place]. He will be seen on Friday.  Obesity: He has lost some weight since his last visit. I encourage him to try to eat right and lose further weight.   Signed, Mina Marble, MD, [facility] 01/24/2014 4:32 PM

## 2014-01-25 ENCOUNTER — Telehealth: Payer: Self-pay | Admitting: *Deleted

## 2014-01-25 MED ORDER — DILTIAZEM HCL ER COATED BEADS 240 MG PO CP24: 240.0000 mg | ORAL_CAPSULE | Freq: Every day | ORAL | Status: DC

## 2014-01-25 MED ORDER — APIXABAN 5 MG PO TABS: 5.0000 mg | ORAL_TABLET | Freq: Two times a day (BID) | ORAL | Status: DC

## 2014-01-25 NOTE — Telephone Encounter (Signed)
[facility]

## 2014-01-25 NOTE — Telephone Encounter (Signed)
Patients wife called stating that the patient would like [facility] are requesting progress notes and an rx for both meds be faxed to Dr Crisoforo Oxford at [phone]. Thanks, MI

## 2014-01-25 NOTE — Telephone Encounter (Signed)
Printed and will fax OV and rx's to [place].

## 2014-01-26 ENCOUNTER — Ambulatory Visit (INDEPENDENT_AMBULATORY_CARE_PROVIDER_SITE_OTHER): Payer: Medicare Other | Admitting: *Deleted

## 2014-01-26 ENCOUNTER — Ambulatory Visit (HOSPITAL_COMMUNITY): Payer: Medicare Other | Attending: Internal Medicine | Admitting: Cardiology

## 2014-01-26 VITALS — BP 126/62 | HR 74 | Wt 235.8 lb

## 2014-01-26 DIAGNOSIS — I712 Thoracic aortic aneurysm, without rupture, unspecified: Secondary | ICD-10-CM | POA: Insufficient documentation

## 2014-01-26 DIAGNOSIS — I4892 Unspecified atrial flutter: Secondary | ICD-10-CM | POA: Diagnosis not present

## 2014-01-26 DIAGNOSIS — I739 Peripheral vascular disease, unspecified: Secondary | ICD-10-CM | POA: Insufficient documentation

## 2014-01-26 DIAGNOSIS — R29898 Other symptoms and signs involving the musculoskeletal system: Secondary | ICD-10-CM

## 2014-01-26 NOTE — Progress Notes (Signed)
1.) Reason for visit: ekg  2.) Name of MD requesting visit: Dr. Irish Lack  3.) H&P: Atrial flutter  4.) ROS related to problem: asymptomatic. No complaints  5.) Assessment and plan per MD:Dr. Martinique ( DOD) reviewed pt is sinus rhythm. No changes to medications  6.) Provider sign-of(MD statement):    Horton Chin RN

## 2014-01-26 NOTE — Progress Notes (Signed)
Back in NSR.  COntinue current meds.  If he has a recurrence, would consider referral to EP.

## 2014-01-26 NOTE — Progress Notes (Signed)
LEA Doppler and ABI's performed

## 2014-01-29 ENCOUNTER — Telehealth: Payer: Self-pay | Admitting: Interventional Cardiology

## 2014-01-29 MED ORDER — DILTIAZEM HCL ER COATED BEADS 180 MG PO CP24: ORAL_CAPSULE | ORAL | Status: DC

## 2014-01-29 NOTE — Telephone Encounter (Signed)
To Dr. Varanasi, please advise.  

## 2014-01-29 NOTE — Telephone Encounter (Signed)
Pts wife notified. Meds udpated.

## 2014-01-29 NOTE — Telephone Encounter (Signed)
New message     C/o feet really swollen, face burning under beard and urine output is really low.  Please advise

## 2014-01-29 NOTE — Telephone Encounter (Signed)
Can increase fluid intake.

## 2014-01-29 NOTE — Addendum Note (Signed)
Addended byUlla Potash H on: 01/29/2014 02:16 PM   Modules accepted: Orders

## 2014-01-29 NOTE — Telephone Encounter (Signed)
Decrease diltiazem to 180 mg daily.

## 2014-02-01 ENCOUNTER — Telehealth: Payer: Self-pay | Admitting: Interventional Cardiology

## 2014-02-01 NOTE — Telephone Encounter (Signed)
New message    Patient wife calling stating his right foot is still swelling . The right is much worse. Diltiazem was decrease.

## 2014-02-01 NOTE — Telephone Encounter (Signed)
Pts leg weakness is much better, his only concern is the leg swelling.

## 2014-02-01 NOTE — Telephone Encounter (Signed)
To Dr. Varanasi, please advise.  

## 2014-02-01 NOTE — Telephone Encounter (Signed)
Pts wife aware, while pt sitting beside her. They will try compression hose that they already have and if LE edema persists he will call back.

## 2014-02-01 NOTE — Telephone Encounter (Signed)
Likley related to venous insufficieny.  Can try compression stockings.  Elevate legs at night and when convenient during the day.  If this becomes bothersome, could try diuretic but would like to avoid an additional medicine.

## 2014-02-01 NOTE — Telephone Encounter (Signed)
Spoke with pts wife and pt is doing okay on the diltiazem. His HR is running around 60-70's. When pt woke up this morning his swelling was almost gone and then he was up on his feet maybe 10 minutes and swelling came right back. He has swelling in both feel and legs, but right leg is worse. Pt is drinking 64oz of water a day. Pts wife doesn't think pts weight has changed very much.

## 2014-02-07 DIAGNOSIS — R609 Edema, unspecified: Secondary | ICD-10-CM | POA: Diagnosis not present

## 2014-02-07 DIAGNOSIS — R5381 Other malaise: Secondary | ICD-10-CM | POA: Diagnosis not present

## 2014-02-07 DIAGNOSIS — E559 Vitamin D deficiency, unspecified: Secondary | ICD-10-CM | POA: Diagnosis not present

## 2014-02-07 DIAGNOSIS — I4891 Unspecified atrial fibrillation: Secondary | ICD-10-CM | POA: Diagnosis not present

## 2014-02-07 DIAGNOSIS — E039 Hypothyroidism, unspecified: Secondary | ICD-10-CM | POA: Diagnosis not present

## 2014-02-07 DIAGNOSIS — R5383 Other fatigue: Secondary | ICD-10-CM | POA: Diagnosis not present

## 2014-02-07 DIAGNOSIS — I10 Essential (primary) hypertension: Secondary | ICD-10-CM | POA: Diagnosis not present

## 2014-02-07 DIAGNOSIS — R29898 Other symptoms and signs involving the musculoskeletal system: Secondary | ICD-10-CM | POA: Diagnosis not present

## 2014-02-08 ENCOUNTER — Ambulatory Visit (HOSPITAL_COMMUNITY): Payer: Medicare Other | Attending: Cardiovascular Disease | Admitting: Radiology

## 2014-02-08 DIAGNOSIS — I4891 Unspecified atrial fibrillation: Secondary | ICD-10-CM | POA: Diagnosis not present

## 2014-02-08 DIAGNOSIS — M1710 Unilateral primary osteoarthritis, unspecified knee: Secondary | ICD-10-CM | POA: Diagnosis not present

## 2014-02-08 DIAGNOSIS — I4892 Unspecified atrial flutter: Secondary | ICD-10-CM

## 2014-02-08 NOTE — Progress Notes (Signed)
Echocardiogram performed.  

## 2014-02-13 ENCOUNTER — Ambulatory Visit (INDEPENDENT_AMBULATORY_CARE_PROVIDER_SITE_OTHER): Payer: Medicare Other | Admitting: Interventional Cardiology

## 2014-02-13 ENCOUNTER — Encounter: Payer: Self-pay | Admitting: Interventional Cardiology

## 2014-02-13 VITALS — BP 136/79 | HR 70 | Wt 235.0 lb

## 2014-02-13 DIAGNOSIS — I4891 Unspecified atrial fibrillation: Secondary | ICD-10-CM

## 2014-02-13 DIAGNOSIS — I712 Thoracic aortic aneurysm, without rupture, unspecified: Secondary | ICD-10-CM | POA: Diagnosis not present

## 2014-02-13 DIAGNOSIS — I4892 Unspecified atrial flutter: Secondary | ICD-10-CM | POA: Diagnosis not present

## 2014-02-13 DIAGNOSIS — E669 Obesity, unspecified: Secondary | ICD-10-CM

## 2014-02-13 DIAGNOSIS — Z79899 Other long term (current) drug therapy: Secondary | ICD-10-CM | POA: Diagnosis not present

## 2014-02-13 MED ORDER — FUROSEMIDE 40 MG PO TABS: ORAL_TABLET | ORAL | Status: DC

## 2014-02-13 NOTE — Patient Instructions (Signed)
Your physician recommends that you continue on your current medications as directed. Please refer to the Current Medication list given to you today.  Your physician has recommended you make the following change in your medication:   1. Start Lasix 40 mg 1 tablet by mouth as needed for swelling.   Rx given to you today for compression stockings.   Your physician recommends that you return for lab work in: 1 week on 02/21/14.   Your physician recommends that you schedule a follow-up appointment in: 4 months with Dr. Irish Lack.

## 2014-02-13 NOTE — Progress Notes (Signed)
Patient ID: Don Jacobs, male   DOB: 1936/08/18, 78 y.o.   MRN: [id number] Patient ID: Don Jacobs, male   DOB: 01/06/1936, 78 y.o.   MRN: [id number]    [place], [address], [place]  [address] Phone: [phone] Fax:  [phone]  Date:  02/13/2014   ID:  Don Jacobs, DOB 11/08/1935, MRN [id number]  PCP:  Henrine Screws, MD      History of Present Illness: Don Jacobs is a 78 y.o. male who has had PAF and was on Coumadin. He had a fall and an ICH affecting the right temporal lobe. He was admitted to [facility]. While there, he had significant bradycardia with HR dropping to as low as 29 bpm.  He eventually had a pacemaker placed.  Although it was thought to be safe to go back on anticoagulation from his neurosurgeon, he did not want to take this medicine.  Over the past few weeks, he has had leg weakness. He has had intermittent palpitations and fatigue. Today, his heart rate was in the 130s by monitor. He comes in for evaluation. He has not had chest pain, lightheadedness or syncope. His blood pressure has been stable. He just feels tired and if he tries to do any activity, he gets tired very easily.    Wt Readings from Last 3 Encounters:  02/13/14 235 lb (106.595 kg)  01/26/14 235 lb 12.8 oz (106.958 kg)  01/24/14 230 lb (104.327 kg)     Past Medical History  Diagnosis Date  . Hypertension   . A-fib   . Sleep apnea   . Gout   . Hypothyroidism   . Allergic rhinitis   . ED (erectile dysfunction)   . BPH (benign prostatic hyperplasia)   . Hearing loss     uses hearing a cyst  . Hx of migraines   . Prostatitis, chronic   . Spondylolisthesis 07/2010  . Seborrheic keratosis   . Aortic aneurysm, thoracic 05/25/2011  . Pacemaker     Current Outpatient Prescriptions  Medication Sig Dispense Refill  . allopurinol (ZYLOPRIM) 300 MG tablet Take 300 mg by mouth at bedtime.       Marland Kitchen apixaban (ELIQUIS) 5 MG TABS tablet Take 1 tablet (5 mg total) by  mouth 2 (two) times daily.  180 tablet  3  . Calcium Carbonate (CALTRATE 600 PO) Take 1 capsule by mouth at bedtime.       . cholecalciferol (VITAMIN D) 1000 UNITS tablet Take 1,000 Units by mouth at bedtime.      . colchicine 0.6 MG tablet Take 0.6 mg by mouth daily as needed. For gout      . cyanocobalamin 500 MCG tablet Take 500 mcg by mouth daily.      Marland Kitchen diltiazem (CARDIZEM CD) 180 MG 24 hr capsule 1 tablet by mouth daily  30 capsule  6  . doxazosin (CARDURA) 8 MG tablet Take 8 mg by mouth at bedtime.        Marland Kitchen levothyroxine (SYNTHROID, LEVOTHROID) 112 MCG tablet Take 112 mcg by mouth every evening.       . loratadine (CLARITIN) 10 MG tablet Take 10 mg by mouth as needed.       . predniSONE (DELTASONE) 10 MG tablet Take 10 mg by mouth daily as needed. Take as directed as needed for gout flare ups      . vitamin C (ASCORBIC ACID) 500 MG tablet Take 500 mg by mouth daily.  No current facility-administered medications for this visit.    Allergies:    Allergies  Allergen Reactions  . Penicillins Shortness Of Breath and Rash    Ended up at ER after using  . Oxycodone     Social History:  The patient  reports that he quit smoking about 32 years ago. His smoking use included Cigarettes. He has a 12 pack-year smoking history. He has never used smokeless tobacco. He reports that he does not drink alcohol or use illicit drugs.   Family History:  The patient's family history includes Heart disease in his father.   ROS:  Please see the history of present illness.  No nausea, vomiting.  No fevers, chills.  No focal weakness.  No dysuria. Fatigue.   All other systems reviewed and negative.   PHYSICAL EXAM: VS:  BP 136/79  Pulse 70  Wt 235 lb (106.595 kg) Well nourished, well developed, in no acute distress HEENT: normal Neck: no JVD, no carotid bruits Cardiac:  normal S1, S2; regular, tachycardic Lungs:  clear to auscultation bilaterally, no wheezing, rhonchi or rales Abd: soft,  nontender, no hepatomegaly Ext: no edema Skin: warm and dry Neuro:   no focal abnormalities noted  EKG:  Atrial pacing, no ST segment changes   ASSESSMENT AND PLAN:  1. Atrial flutter:   started Eliquis 5 mg BID; Decreased diltiazem to 180 mg daily due to swelling on the higher dose.  2. Aortic aneurysm of unspecified site without mention of rupture  Notes: Follows with Dr. Cyndia Bent. This has been stable.  3. Edema: Elevate legs at night.  Likely venous insufficiency vs. Side effect from CCB.  Better in the morning. Consider compression stocking.  20-30 mm Hg, although he deos not want to use compression stockings longterm. Start Lasix 40 mg daily prn.  Hesitant to decrease cardizem since he had atrial flutter at the lower dose.  TSH normal in 12/14.  Obesity: He had lost some weight , but has gained some back since his last visit. I encourage him to try to eat right and lose further weight.   Signed, Mina Marble, MD, [facility] 02/13/2014 3:42 PM

## 2014-02-15 DIAGNOSIS — D485 Neoplasm of uncertain behavior of skin: Secondary | ICD-10-CM | POA: Diagnosis not present

## 2014-02-15 DIAGNOSIS — L570 Actinic keratosis: Secondary | ICD-10-CM | POA: Diagnosis not present

## 2014-02-21 ENCOUNTER — Other Ambulatory Visit: Payer: Medicare Other

## 2014-02-26 DIAGNOSIS — M6580 Other synovitis and tenosynovitis, unspecified site: Secondary | ICD-10-CM | POA: Diagnosis not present

## 2014-02-26 DIAGNOSIS — M1710 Unilateral primary osteoarthritis, unspecified knee: Secondary | ICD-10-CM | POA: Diagnosis not present

## 2014-02-27 ENCOUNTER — Encounter: Payer: Self-pay | Admitting: Internal Medicine

## 2014-02-27 ENCOUNTER — Ambulatory Visit (INDEPENDENT_AMBULATORY_CARE_PROVIDER_SITE_OTHER): Payer: Medicare Other | Admitting: Internal Medicine

## 2014-02-27 VITALS — BP 124/71 | HR 86 | Ht 69.0 in | Wt 233.0 lb

## 2014-02-27 DIAGNOSIS — Z950 Presence of cardiac pacemaker: Secondary | ICD-10-CM

## 2014-02-27 DIAGNOSIS — R001 Bradycardia, unspecified: Secondary | ICD-10-CM

## 2014-02-27 DIAGNOSIS — I4891 Unspecified atrial fibrillation: Secondary | ICD-10-CM | POA: Diagnosis not present

## 2014-02-27 DIAGNOSIS — I498 Other specified cardiac arrhythmias: Secondary | ICD-10-CM

## 2014-02-27 LAB — MDC_IDC_ENUM_SESS_TYPE_INCLINIC
Brady Statistic AP VP Percent: 0.05 %
Brady Statistic AP VS Percent: 70.18 %
Brady Statistic AS VP Percent: 0.51 %
Brady Statistic RA Percent Paced: 70.23 %
Brady Statistic RV Percent Paced: 0.57 %
Lead Channel Impedance Value: 418 Ohm
Lead Channel Impedance Value: 475 Ohm
Lead Channel Impedance Value: 532 Ohm
Lead Channel Pacing Threshold Amplitude: 0.5 V
Lead Channel Pacing Threshold Pulse Width: 0.4 ms
Lead Channel Sensing Intrinsic Amplitude: 10.625 mV
Lead Channel Sensing Intrinsic Amplitude: 9.75 mV
Lead Channel Setting Pacing Amplitude: 1.5 V
Lead Channel Setting Sensing Sensitivity: 4 mV
MDC IDC MSMT BATTERY REMAINING LONGEVITY: 100 mo
MDC IDC MSMT BATTERY VOLTAGE: 3.02 V
MDC IDC MSMT LEADCHNL RA IMPEDANCE VALUE: 380 Ohm
MDC IDC MSMT LEADCHNL RA SENSING INTR AMPL: 2.875 mV
MDC IDC MSMT LEADCHNL RA SENSING INTR AMPL: 3.125 mV
MDC IDC MSMT LEADCHNL RV PACING THRESHOLD AMPLITUDE: 0.75 V
MDC IDC MSMT LEADCHNL RV PACING THRESHOLD PULSEWIDTH: 0.4 ms
MDC IDC SESS DTM: 20150602110046
MDC IDC SET LEADCHNL RV PACING AMPLITUDE: 2 V
MDC IDC SET LEADCHNL RV PACING PULSEWIDTH: 0.4 ms
MDC IDC SET ZONE DETECTION INTERVAL: 350 ms
MDC IDC STAT BRADY AS VS PERCENT: 29.26 %
Zone Setting Detection Interval: 400 ms

## 2014-02-27 NOTE — Patient Instructions (Addendum)
We will remotely monitor you on 03/29/14 (at no charge) to look at Atrial Fibrillation burden.  Remote monitoring is used to monitor your Pacemaker of ICD from home. This monitoring reduces the number of office visits required to check your device to one time per year. It allows [place] to keep an eye on the functioning of your device to ensure it is working properly. You are scheduled for a device check from home on 05/31/2014. You may send your transmission at any time that day. If you have a wireless device, the transmission will be sent automatically. After your physician reviews your transmission, you will receive a postcard with your next transmission date.  Your physician wants you to follow-up in: 1 year with Dr. Caryl Comes.  You will receive a reminder letter in the mail two months in advance. If you don't receive a letter, please call our office to schedule the follow-up appointment.

## 2014-02-27 NOTE — Progress Notes (Signed)
skf      Patient Care Team: [name], MD as PCP - General (Internal Medicine) Jettie Booze, MD (Cardiology)   HPI  Don Jacobs is a 78 y.o. male Seen following pacemaker implantation 5/14 for paroxysmal atrial fibrillation with a rapid rate associated with symptomatic post termination pausing and presyncope. He also has underlying bradycardia.  His CHADS-VASc score is 4, age-40, hypertension-1, and vascular disease- 1.   He is currently on apixaban.  He is struggling recently with recurrent atrial fibrillation which was quite symptomatic. Diltiazem was increased. He tolerated this poorly. He developed significant edema which is treated with support stockings and diuretics and is gradually improving.  He anticipates knee surgery in July.  He was to get an MRI for his knees; it would not do that at the [place].  Past Medical History  Diagnosis Date  . Hypertension   . A-fib   . Sleep apnea   . Gout   . Hypothyroidism   . Allergic rhinitis   . ED (erectile dysfunction)   . BPH (benign prostatic hyperplasia)   . Hearing loss     uses hearing a cyst  . Hx of migraines   . Prostatitis, chronic   . Spondylolisthesis 07/2010  . Seborrheic keratosis   . Aortic aneurysm, thoracic 05/25/2011  . Pacemaker     Past Surgical History  Procedure Laterality Date  . Back surgery    . L5 selective nerve root block      Dr Nelva Bush  . Foot surgery Right ~ 2007    "reconstruction" (02/17/2013)  . Shoulder open rotator cuff repair Right 09/2007; 2013  . Repair peroneal tendons ankle Right 10/2003  . Knee arthroscopy Left ~ 2007  . Anterior cervical decomp/discectomy fusion  10/2000    "C4, 5, 6 w/fusion" (02/17/2013)  . Lumbar spine surgery  2012    "bone graft, Dr. [name]" (02/17/2013)  . Insert / replace / remove pacemaker  02/17/2013  . Cardiac catheterization  2000's    "once" (02/17/2013)    Current Outpatient Prescriptions  Medication Sig Dispense Refill  . allopurinol  (ZYLOPRIM) 300 MG tablet Take 300 mg by mouth at bedtime.       Marland Kitchen apixaban (ELIQUIS) 5 MG TABS tablet Take 1 tablet (5 mg total) by mouth 2 (two) times daily.  180 tablet  3  . Calcium Carbonate (CALTRATE 600 PO) Take 1 capsule by mouth at bedtime.       . cholecalciferol (VITAMIN D) 1000 UNITS tablet Take 1,000 Units by mouth at bedtime.      . colchicine 0.6 MG tablet Take 0.6 mg by mouth daily as needed. For gout      . cyanocobalamin 500 MCG tablet Take 500 mcg by mouth daily.      Marland Kitchen diltiazem (CARDIZEM CD) 180 MG 24 hr capsule 1 tablet by mouth daily  30 capsule  6  . doxazosin (CARDURA) 8 MG tablet Take 8 mg by mouth at bedtime.        . furosemide (LASIX) 40 MG tablet 1 tablet by mouth daily as needed for swelling  30 tablet  6  . levothyroxine (SYNTHROID, LEVOTHROID) 112 MCG tablet Take 112 mcg by mouth every evening.       . loratadine (CLARITIN) 10 MG tablet Take 10 mg by mouth as needed.       . predniSONE (DELTASONE) 10 MG tablet Take 10 mg by mouth daily as needed. Take as directed as needed for gout  flare ups      . vitamin C (ASCORBIC ACID) 500 MG tablet Take 500 mg by mouth daily.        No current facility-administered medications for this visit.    Allergies  Allergen Reactions  . Penicillins Shortness Of Breath and Rash    Ended up at ER after using  . Oxycodone     Review of Systems negative except from HPI and PMH  Physical Exam BP 124/71  Pulse 86  Ht 5\' 9"  (1.753 m)  Wt 233 lb (105.688 kg)  BMI 34.39 kg/m2 Well developed and well nourished in no acute distress HENT normal E scleral and icterus clear Neck Supple  carotids brisk and full Clear to ausculation  Regular rate and rhythm, no murmurs gallops or rub Soft with active bowel sounds No clubbing cyanosis 2+ Edema Alert and oriented, grossly normal motor and sensory function Skin Warm and Dry  ECG demonstrates atrial pacing at 82 Intervals 22/09/36  Assessment and  Plan  Sinus node  dysfunction  Atrial fibrillation-paroxysmal  HFpEF  Pacemaker-[employer] The patient's device was interrogated.  The information was reviewed. No changes were made in the programming.    The patient is doing reasonably well although they're increasing paroxysms of atrial fibrillation with the introduction of a calcium blocker he developed significant edema. I'm not sure whether this is related to the calcium blocker or the HFpEF. He is improving with his diuretics. Followup blood work is scheduled for next week.  We will check it remote interrogation his atrial fibrillation burden in the next month or so. He may well benefit from beta blockade and/or antiarrhythmic therapy.

## 2014-03-05 ENCOUNTER — Telehealth: Payer: Self-pay | Admitting: Interventional Cardiology

## 2014-03-05 ENCOUNTER — Other Ambulatory Visit: Payer: Medicare Other

## 2014-03-05 NOTE — Telephone Encounter (Signed)
Is blood pressure low?  Diltiazem would only cause weakness with low BP.

## 2014-03-05 NOTE — Telephone Encounter (Signed)
To Dr. Varanasi, please advise.  

## 2014-03-05 NOTE — Telephone Encounter (Signed)
New message     Patient calling C/O weakness from diltiazem  180 mg

## 2014-03-05 NOTE — Telephone Encounter (Signed)
Pt states BP is doing well and it runs 130/70's. Pt states he doesn't know what to do because he is so tired he can barley go. His fluid is better, but the weakness in arms/legs have continued to occur. Pt states he sleeps all the time. He recently had blood work done at the [place] and he will bring it by. The blood work was fine per patient.

## 2014-03-07 ENCOUNTER — Telehealth: Payer: Self-pay | Admitting: Interventional Cardiology

## 2014-03-07 NOTE — Telephone Encounter (Signed)
Walk In pt Form " Sealed Envelope" Will Give to Amy On Thursday  6.11

## 2014-03-09 MED ORDER — DILTIAZEM HCL 60 MG PO TABS: 60.0000 mg | ORAL_TABLET | ORAL | Status: DC | PRN

## 2014-03-09 NOTE — Telephone Encounter (Signed)
Pt woke up this am with HR of 130's and then it went down for 30 minutes to 67 bpm. Pts hr is currently 136 bpm (feels regular to pt) and he is having SOB and weakness has continued. This all started at 6:45am. Bp was 112/80 when he got up and now it is 132/78.

## 2014-03-09 NOTE — Telephone Encounter (Signed)
Pt has Diltiazem 60 mg at home that he received from the [place]. Per Dr. Irish Lack he can go ahead and take one 60 mg capsule and continue using up to 3 times a day as needed. Pt will have a remote check on 03/29/14 with Dr. Caryl Comes and that should let us know more information regarding Afib/flutter burden. Pt aware.

## 2014-03-09 NOTE — Telephone Encounter (Signed)
Follow up     Talk to Don Jacobs---pt has fast heart rate and sob.  Don Jacobs please call

## 2014-03-12 DIAGNOSIS — M65839 Other synovitis and tenosynovitis, unspecified forearm: Secondary | ICD-10-CM | POA: Diagnosis not present

## 2014-03-12 DIAGNOSIS — M65849 Other synovitis and tenosynovitis, unspecified hand: Secondary | ICD-10-CM | POA: Diagnosis not present

## 2014-03-27 ENCOUNTER — Telehealth: Payer: Self-pay | Admitting: Interventional Cardiology

## 2014-03-27 NOTE — Telephone Encounter (Signed)
New Message  Pt wife called states the pt is very tired complaining of SOB for 2-[redacted] weeks along with Afib.. Pulse skipping every 3-4 beat. Requests a call back to discuss if there is something that they are supposed to do differently please call.

## 2014-03-27 NOTE — Telephone Encounter (Addendum)
Spoke with pts wife and she just wanted to give [place] an update. Pts HR is running between 60-90's with intermittent irregular beats. He did have a couple of good weeks and then over the last 2-3 weeks he started to feel bloated, weakness returned and dizziness with standing. He also c/o of intermittent SOB with and without exertion. Bp running 120-30's/80's. Today he did have a lower reading of 104/68. Pt has a remote check scheduled for 03/29/14 which will tell us his afib burden (see Dr. Olin Pia last [place] note)

## 2014-03-28 DIAGNOSIS — M25569 Pain in unspecified knee: Secondary | ICD-10-CM | POA: Diagnosis not present

## 2014-03-28 DIAGNOSIS — M1710 Unilateral primary osteoarthritis, unspecified knee: Secondary | ICD-10-CM | POA: Diagnosis not present

## 2014-03-28 DIAGNOSIS — M6580 Other synovitis and tenosynovitis, unspecified site: Secondary | ICD-10-CM | POA: Diagnosis not present

## 2014-03-28 NOTE — Telephone Encounter (Signed)
Await results from visit with Dr. Caryl Comes.

## 2014-03-29 ENCOUNTER — Telehealth: Payer: Self-pay | Admitting: Cardiology

## 2014-03-29 ENCOUNTER — Ambulatory Visit (INDEPENDENT_AMBULATORY_CARE_PROVIDER_SITE_OTHER): Payer: Medicare Other | Admitting: *Deleted

## 2014-03-29 DIAGNOSIS — I4891 Unspecified atrial fibrillation: Secondary | ICD-10-CM

## 2014-03-29 MED ORDER — METOPROLOL SUCCINATE ER 50 MG PO TB24: 50.0000 mg | ORAL_TABLET | Freq: Every day | ORAL | Status: DC

## 2014-03-29 NOTE — Telephone Encounter (Signed)
LMOVM for pt to return call 

## 2014-03-29 NOTE — Telephone Encounter (Signed)
Follow UP:   Pt is returning a call to [facility]

## 2014-03-29 NOTE — Telephone Encounter (Signed)
Dr. Caryl Comes reviewed remote transmission, which showed increased afib. Diltiazem was stopped ane Metoprolol was started. See other telephone note. Remote Transmission will be scanned in the next 2 weeks.

## 2014-03-29 NOTE — Telephone Encounter (Signed)
After consulting with MD about pt remote transmission. MD advised for pt to stop diltiazem and start metoprolol succinate 50 MG. Called pt and instructed pt about medication change. Medicine sent to [place]. Pt verbalized understanding.

## 2014-03-29 NOTE — Progress Notes (Signed)
Remote pacemaker transmission.   

## 2014-04-03 ENCOUNTER — Telehealth: Payer: Self-pay | Admitting: Interventional Cardiology

## 2014-04-03 NOTE — Telephone Encounter (Signed)
To Sherri, we will need to speak with Dr. Caryl Comes regarding pt as he recently changed pts medication.

## 2014-04-03 NOTE — Telephone Encounter (Signed)
New message     Pt is having "bad" afib since Saturday.  Please advise

## 2014-04-03 NOTE — Telephone Encounter (Signed)
Spoke with pts wife and pts HR over the weekend was around 80 bpm and irregular. Pt thinks he has been in afib since Saturday morning. Pts HR is still running in the 80's with BP 110's/60's. Pt c/o weakness in his legs, fatigue and SOB.

## 2014-04-04 NOTE — Telephone Encounter (Signed)
Pt scheduled for OV with Dr. Caryl Comes 7/9

## 2014-04-05 ENCOUNTER — Encounter: Payer: Self-pay | Admitting: Internal Medicine

## 2014-04-05 ENCOUNTER — Ambulatory Visit (INDEPENDENT_AMBULATORY_CARE_PROVIDER_SITE_OTHER): Payer: Medicare Other | Admitting: Internal Medicine

## 2014-04-05 ENCOUNTER — Encounter: Payer: Self-pay | Admitting: *Deleted

## 2014-04-05 VITALS — BP 134/77 | HR 119 | Ht 69.0 in | Wt 232.0 lb

## 2014-04-05 DIAGNOSIS — Z01812 Encounter for preprocedural laboratory examination: Secondary | ICD-10-CM

## 2014-04-05 DIAGNOSIS — I4819 Other persistent atrial fibrillation: Secondary | ICD-10-CM

## 2014-04-05 DIAGNOSIS — I4891 Unspecified atrial fibrillation: Secondary | ICD-10-CM | POA: Diagnosis not present

## 2014-04-05 DIAGNOSIS — Z950 Presence of cardiac pacemaker: Secondary | ICD-10-CM

## 2014-04-05 LAB — CBC WITH DIFFERENTIAL/PLATELET
Basophils Absolute: 0 10*3/uL (ref 0.0–0.1)
Basophils Relative: 0.4 % (ref 0.0–3.0)
EOS ABS: 0 10*3/uL (ref 0.0–0.7)
Eosinophils Relative: 1 % (ref 0.0–5.0)
HCT: 42.2 % (ref 39.0–52.0)
Hemoglobin: 14.2 g/dL (ref 13.0–17.0)
LYMPHS ABS: 1.1 10*3/uL (ref 0.7–4.0)
Lymphocytes Relative: 24.6 % (ref 12.0–46.0)
MCHC: 33.7 g/dL (ref 30.0–36.0)
MCV: 93.9 fl (ref 78.0–100.0)
MONO ABS: 0.4 10*3/uL (ref 0.1–1.0)
Monocytes Relative: 8.9 % (ref 3.0–12.0)
NEUTROS PCT: 65.1 % (ref 43.0–77.0)
Neutro Abs: 3 10*3/uL (ref 1.4–7.7)
PLATELETS: 138 10*3/uL — AB (ref 150.0–400.0)
RBC: 4.5 Mil/uL (ref 4.22–5.81)
RDW: 14.8 % (ref 11.5–15.5)
WBC: 4.6 10*3/uL (ref 4.0–10.5)

## 2014-04-05 LAB — MDC_IDC_ENUM_SESS_TYPE_INCLINIC
Battery Remaining Longevity: 87 mo
Brady Statistic AP VP Percent: 0.05 %
Brady Statistic AP VS Percent: 40.12 %
Brady Statistic AS VP Percent: 9.03 %
Brady Statistic AS VS Percent: 50.8 %
Brady Statistic RV Percent Paced: 9.08 %
Lead Channel Impedance Value: 361 Ohm
Lead Channel Impedance Value: 399 Ohm
Lead Channel Impedance Value: 475 Ohm
Lead Channel Impedance Value: 532 Ohm
Lead Channel Pacing Threshold Amplitude: 0.625 V
Lead Channel Sensing Intrinsic Amplitude: 1.375 mV
Lead Channel Sensing Intrinsic Amplitude: 11.25 mV
Lead Channel Sensing Intrinsic Amplitude: 11.25 mV
Lead Channel Setting Pacing Amplitude: 2 V
Lead Channel Setting Pacing Pulse Width: 0.4 ms
MDC IDC MSMT BATTERY VOLTAGE: 3 V
MDC IDC MSMT LEADCHNL RA SENSING INTR AMPL: 1.375 mV
MDC IDC MSMT LEADCHNL RV PACING THRESHOLD PULSEWIDTH: 0.4 ms
MDC IDC SESS DTM: 20150709151507
MDC IDC SET LEADCHNL RV PACING AMPLITUDE: 2.5 V
MDC IDC SET LEADCHNL RV SENSING SENSITIVITY: 4 mV
MDC IDC SET ZONE DETECTION INTERVAL: 350 ms
MDC IDC STAT BRADY RA PERCENT PACED: 40.18 %
Zone Setting Detection Interval: 400 ms

## 2014-04-05 LAB — BASIC METABOLIC PANEL
BUN: 15 mg/dL (ref 6–23)
CO2: 30 meq/L (ref 19–32)
CREATININE: 1.2 mg/dL (ref 0.4–1.5)
Calcium: 9.6 mg/dL (ref 8.4–10.5)
Chloride: 102 mEq/L (ref 96–112)
GFR: 65.11 mL/min (ref >60.00)
GLUCOSE: 97 mg/dL (ref 70–99)
POTASSIUM: 3.6 meq/L (ref 3.5–5.1)
Sodium: 139 mEq/L (ref 135–145)

## 2014-04-05 MED ORDER — PROPAFENONE HCL ER 225 MG PO CP12: 225.0000 mg | ORAL_CAPSULE | Freq: Two times a day (BID) | ORAL | Status: DC

## 2014-04-05 NOTE — Patient Instructions (Signed)
Your physician has recommended you make the following change in your medication:  1) START Propafenone 225 mg twice daily  Pre procedure labs today: BMET/CBCD  Your physician has recommended that you have a Cardioversion (DCCV). Electrical Cardioversion uses a jolt of electricity to your heart either through paddles or wired patches attached to your chest. This is a controlled, usually prescheduled, procedure. Defibrillation is done under light anesthesia in the hospital, and you usually go home the day of the procedure. This is done to get your heart back into a normal rhythm. You are not awake for the procedure. Please see the instruction sheet given to you today.   Electrical Cardioversion Electrical cardioversion is the delivery of a jolt of electricity to change the rhythm of the heart. Sticky patches or metal paddles are placed on the chest to deliver the electricity from a device. This is done to restore a normal rhythm. A rhythm that is too fast or not regular keeps the heart from pumping well. Electrical cardioversion is done in an emergency if:   There is low or no blood pressure as a result of the heart rhythm.   Normal rhythm must be restored as fast as possible to protect the brain and heart from further damage.   It may save a life. Cardioversion may be done for heart rhythms that are not immediately life-threatening, such as atrial fibrillation or flutter, in which:   The heart is beating too fast or is not regular.   Medicine to change the rhythm has not worked.   It is safe to wait in order to allow time for preparation.  Symptoms of the abnormal rhythm are bothersome.  The risk of stroke and other serious complications can be reduced. LET YOUR CAREGIVER KNOW ABOUT:   All medicines you are taking, including vitamins, herbs, eye drops, creams, and over-the-counter medicines.   Previous problems you or members of your family have had with the use of anesthetics.    Any blood disorders you have.   Previous surgeries you have had.   Medical conditions you have. RISKS AND COMPLICATIONS  Generally, this is a safe procedure. However, as with any procedure, complications can occur. Possible complications include:   Breathing problems related to the anesthetic used.  Cardiac arrest--This risk is rare.  A blood clot that breaks free and travels to other parts of your body. This could cause a stroke or other problems. The risk of this is lowered by use of blood thinning medicine (anticoagulant) prior to the procedure. BEFORE THE PROCEDURE   You may have tests to detect blood clots in your heart and evaluate heart function.  You may start taking anticoagulants so your blood does not clot as easily.   Medicines may be given to help stabilize your heart rate and rhythm. PROCEDURE  You will be given medicine through an IV tube to reduce discomfort and make you sleepy (sedative).   An electrical shock will be delivered. AFTER THE PROCEDURE Your heart rhythm will be watched to make sure it does not change.You may be able to go home within a few hours.  Document Released: 09/04/2002 Document Revised: 07/05/2013 Document Reviewed: 03/29/2013 [facility] Patient Information 2015 [place], [place]. This information is not intended to replace advice given to you by your health care provider. Make sure you discuss any questions you have with your health care provider.

## 2014-04-05 NOTE — Progress Notes (Signed)
skf      Patient Care Team: [name], MD as PCP - General (Internal Medicine) Jettie Booze, MD (Cardiology)   HPI  Don Jacobs is a 78 y.o. male Seen following pacemaker implantation 5/14 for paroxysmal atrial fibrillation with a rapid rate associated with symptomatic post termination pausing and presyncope. He also has underlying bradycardia.  His CHADS-VASc score is 4, age-51, hypertension-1, and vascular disease- 1.   He is currently on apixaban.  He previously had taking amiodarone and flecainide he did not tolerate.   It is his depression and his wife impression that he is markedly limited with atrial fibrillation.     Past Medical History  Diagnosis Date  . Hypertension   . A-fib   . Sleep apnea   . Gout   . Hypothyroidism   . Allergic rhinitis   . ED (erectile dysfunction)   . BPH (benign prostatic hyperplasia)   . Hearing loss     uses hearing a cyst  . Hx of migraines   . Prostatitis, chronic   . Spondylolisthesis 07/2010  . Seborrheic keratosis   . Aortic aneurysm, thoracic 05/25/2011  . Pacemaker     Past Surgical History  Procedure Laterality Date  . Back surgery    . L5 selective nerve root block      Dr Nelva Bush  . Foot surgery Right ~ 2007    "reconstruction" (02/17/2013)  . Shoulder open rotator cuff repair Right 09/2007; 2013  . Repair peroneal tendons ankle Right 10/2003  . Knee arthroscopy Left ~ 2007  . Anterior cervical decomp/discectomy fusion  10/2000    "C4, 5, 6 w/fusion" (02/17/2013)  . Lumbar spine surgery  2012    "bone graft, Dr. [name]" (02/17/2013)  . Insert / replace / remove pacemaker  02/17/2013  . Cardiac catheterization  2000's    "once" (02/17/2013)    Current Outpatient Prescriptions  Medication Sig Dispense Refill  . allopurinol (ZYLOPRIM) 300 MG tablet Take 300 mg by mouth at bedtime.       Marland Kitchen apixaban (ELIQUIS) 5 MG TABS tablet Take 1 tablet (5 mg total) by mouth 2 (two) times daily.  180 tablet  3  .  Calcium Carbonate (CALTRATE 600 PO) Take 1 capsule by mouth at bedtime.       . cholecalciferol (VITAMIN D) 1000 UNITS tablet Take 1,000 Units by mouth at bedtime.      . colchicine 0.6 MG tablet Take 0.6 mg by mouth daily as needed. For gout      . cyanocobalamin 500 MCG tablet Take 500 mcg by mouth daily.      Marland Kitchen doxazosin (CARDURA) 8 MG tablet Take 8 mg by mouth at bedtime.        . furosemide (LASIX) 40 MG tablet 1 tablet by mouth daily as needed for swelling  30 tablet  6  . levothyroxine (SYNTHROID, LEVOTHROID) 112 MCG tablet Take 112 mcg by mouth every evening.       . loratadine (CLARITIN) 10 MG tablet Take 10 mg by mouth as needed.       . metoprolol succinate (TOPROL-XL) 50 MG 24 hr tablet Take 1 tablet (50 mg total) by mouth daily. Take with or immediately following a meal.  30 tablet  6  . predniSONE (DELTASONE) 10 MG tablet Take 10 mg by mouth daily as needed. Take as directed as needed for gout flare ups      . vitamin C (ASCORBIC ACID) 500 MG tablet Take 500  mg by mouth daily.        No current facility-administered medications for this visit.    Allergies  Allergen Reactions  . Penicillins Shortness Of Breath and Rash    Ended up at ER after using  . Oxycodone     Review of Systems negative except from HPI and PMH  Physical Exam BP 134/77  Pulse 119  Ht 5\' 9"  (1.753 m)  Wt 232 lb (105.235 kg)  BMI 34.24 kg/m2 Well developed and well nourished in no acute distress HENT normal E scleral and icterus clear Neck Supple  carotids brisk and full Clear to ausculation Rapid and irregular rate and rhythm, no murmurs gallops or rub Soft with active bowel sounds No clubbing cyanosis 2+ Edema Alert and oriented, grossly normal motor and sensory function Skin Warm and Dry  Atrial fibrillation at 119 Intervals-/09/31 Intervals 22/09/36  Assessment and  Plan  Sinus node dysfunction  Atrial fibrillation-paroxysmal  HFpEF  Obstructive sleep  apnea-treated  Pacemaker-Medtronic The patient's device was interrogated.  The information was reviewed. No changes were made in the programming.     Paroxysms of atrial fibrillation continued to be problematic. He is currently persistently in atrial fibrillation. This is associated with significant exercise intolerance. We discussed rate control versus rhythm control. We discussed for arrhythmic issues. He would like to pursue rhythm control. Will anticipate initiation of propafenone followed by cardioversion.  If this does not work we will pursue dofetilide and referral for catheter ablation  He is treated obstructive sleep apnea

## 2014-04-06 ENCOUNTER — Encounter: Payer: Self-pay | Admitting: Pharmacist

## 2014-04-06 ENCOUNTER — Telehealth: Payer: Self-pay | Admitting: Internal Medicine

## 2014-04-06 ENCOUNTER — Encounter (HOSPITAL_COMMUNITY): Payer: Self-pay

## 2014-04-06 MED ORDER — PROPAFENONE HCL 225 MG PO TABS: 225.0000 mg | ORAL_TABLET | Freq: Two times a day (BID) | ORAL | Status: DC

## 2014-04-06 NOTE — Telephone Encounter (Signed)
Spoke with Dr. Caryl Comes.  Rx was suppose to be for regular release rather than SR.  Have spoken to pharmacy and given new verbal prescription.  Pt's wife aware of change.

## 2014-04-06 NOTE — Progress Notes (Signed)
This encounter was created in error - please disregard.

## 2014-04-06 NOTE — Telephone Encounter (Addendum)
Spoke with pts wife and they tried to pick up Rythmol and it was going to cost him $479.00. Is there another medication pt can take?

## 2014-04-06 NOTE — Telephone Encounter (Signed)
New message     Wife calling due to cost of medication $ 479.00 for 30 days supply . Do they office samples .    Need an alternative.

## 2014-04-09 ENCOUNTER — Encounter (HOSPITAL_COMMUNITY): Payer: Self-pay | Admitting: *Deleted

## 2014-04-09 ENCOUNTER — Other Ambulatory Visit: Payer: Self-pay | Admitting: Cardiology

## 2014-04-09 ENCOUNTER — Ambulatory Visit (HOSPITAL_COMMUNITY): Payer: Medicare Other | Admitting: Certified Registered Nurse Anesthetist

## 2014-04-09 ENCOUNTER — Encounter (HOSPITAL_COMMUNITY)
Admission: RE | Disposition: A | Discharge status: Home / Self Care | Payer: Self-pay | Source: Ambulatory Visit | Attending: Cardiology

## 2014-04-09 ENCOUNTER — Encounter (HOSPITAL_COMMUNITY): Payer: Medicare Other | Admitting: Certified Registered Nurse Anesthetist

## 2014-04-09 ENCOUNTER — Ambulatory Visit (HOSPITAL_COMMUNITY)
Admission: RE | Admit: 2014-04-09 | Discharge: 2014-04-09 | Disposition: A | Discharge status: Home / Self Care | Payer: Medicare Other | Source: Ambulatory Visit | Attending: Cardiology | Admitting: Cardiology

## 2014-04-09 DIAGNOSIS — Z87891 Personal history of nicotine dependence: Secondary | ICD-10-CM | POA: Insufficient documentation

## 2014-04-09 DIAGNOSIS — Z950 Presence of cardiac pacemaker: Secondary | ICD-10-CM | POA: Diagnosis not present

## 2014-04-09 DIAGNOSIS — I480 Paroxysmal atrial fibrillation: Secondary | ICD-10-CM

## 2014-04-09 DIAGNOSIS — Z7901 Long term (current) use of anticoagulants: Secondary | ICD-10-CM | POA: Insufficient documentation

## 2014-04-09 DIAGNOSIS — I10 Essential (primary) hypertension: Secondary | ICD-10-CM | POA: Diagnosis not present

## 2014-04-09 DIAGNOSIS — I739 Peripheral vascular disease, unspecified: Secondary | ICD-10-CM | POA: Insufficient documentation

## 2014-04-09 DIAGNOSIS — I4891 Unspecified atrial fibrillation: Secondary | ICD-10-CM | POA: Diagnosis not present

## 2014-04-09 DIAGNOSIS — G4730 Sleep apnea, unspecified: Secondary | ICD-10-CM | POA: Insufficient documentation

## 2014-04-09 DIAGNOSIS — E039 Hypothyroidism, unspecified: Secondary | ICD-10-CM | POA: Insufficient documentation

## 2014-04-09 DIAGNOSIS — M109 Gout, unspecified: Secondary | ICD-10-CM | POA: Diagnosis not present

## 2014-04-09 HISTORY — PX: CARDIOVERSION: SHX1299

## 2014-04-09 SURGERY — CARDIOVERSION
Anesthesia: Monitor Anesthesia Care

## 2014-04-09 MED ORDER — SODIUM CHLORIDE 0.9 % IV SOLN: 250.0000 mL | INTRAVENOUS | Status: DC

## 2014-04-09 MED ORDER — SODIUM CHLORIDE 0.9 % IJ SOLN: 3.0000 mL | Freq: Two times a day (BID) | INTRAMUSCULAR | Status: DC

## 2014-04-09 MED ORDER — PROPOFOL 10 MG/ML IV BOLUS
INTRAVENOUS | Status: DC | PRN
  Administered 2014-04-09: 90 mg via INTRAVENOUS

## 2014-04-09 MED ORDER — SODIUM CHLORIDE 0.9 % IV SOLN
Freq: Once | INTRAVENOUS | Status: AC
  Administered 2014-04-09: 500 mL via INTRAVENOUS

## 2014-04-09 MED ORDER — SODIUM CHLORIDE 0.9 % IJ SOLN: 3.0000 mL | INTRAMUSCULAR | Status: DC | PRN

## 2014-04-09 MED ORDER — SODIUM CHLORIDE 0.9 % IV SOLN
INTRAVENOUS | Status: DC | PRN
Start: 2014-04-09 — End: 2014-04-09
  Administered 2014-04-09: 10:00:00 via INTRAVENOUS

## 2014-04-09 NOTE — H&P (Signed)
Don Jacobs Description: 78 year old male  04/05/2014 1:30 PM Office Visit Provider: Deboraha Sprang, MD  MRN: [id number] Department: [facility] [address] [facility]             Vital Signs Most recent update: 04/05/2014 1:24 PM by Roberts Gaudy, CMA     BP Pulse Ht Wt BMI    134/77 119 5\' 9"  (1.753 m) 232 lb (105.235 kg) 34.24 kg/m2              Progress Notes     Deboraha Sprang, MD at 04/05/2014 1:59 PM     Status: Signed        skf   Patient Care Team:  [name], MD as PCP - General (Internal Medicine)  Jettie Booze, MD (Cardiology)  HPI  Don Jacobs is a 78 y.o. male  Seen following pacemaker implantation 5/14 for paroxysmal atrial fibrillation with a rapid rate associated with symptomatic post termination pausing and presyncope. He also has underlying bradycardia.  His CHADS-VASc score is 4, age-80, hypertension-1, and vascular disease- 1.  He is currently on apixaban.  He previously had taking amiodarone and flecainide he did not tolerate.  It is his depression and his wife impression that he is markedly limited with atrial fibrillation.      Past Medical History      Diagnosis  Date      .  Hypertension       .  A-fib       .  Sleep apnea       .  Gout       .  Hypothyroidism       .  Allergic rhinitis       .  ED (erectile dysfunction)       .  BPH (benign prostatic hyperplasia)       .  Hearing loss         uses hearing a cyst      .  Hx of migraines       .  Prostatitis, chronic       .  Spondylolisthesis  07/2010      .  Seborrheic keratosis       .  Aortic aneurysm, thoracic  05/25/2011      .  Pacemaker       Past Surgical History      Procedure  Laterality  Date      .  Back surgery        .  L5 selective nerve root block          Dr Nelva Bush      .  Foot surgery  Right  ~ 2007        "reconstruction" (02/17/2013)      .  Shoulder open rotator cuff repair  Right  09/2007; 2013      .  Repair peroneal tendons ankle  Right  10/2003      .   Knee arthroscopy  Left  ~ 2007      .  Anterior cervical decomp/discectomy fusion   10/2000        "C4, 5, 6 w/fusion" (02/17/2013)      .  Lumbar spine surgery   2012        "bone graft, Dr. [name]" (02/17/2013)      .  Insert / replace / remove pacemaker   02/17/2013      .  Cardiac catheterization   2000's        "once" (02/17/2013)      Current Outpatient Prescriptions      Medication  Sig  Dispense  Refill      .  allopurinol (ZYLOPRIM) 300 MG tablet  Take 300 mg by mouth at bedtime.        Marland Kitchen  apixaban (ELIQUIS) 5 MG TABS tablet  Take 1 tablet (5 mg total) by mouth 2 (two) times daily.  180 tablet  3      .  Calcium Carbonate (CALTRATE 600 PO)  Take 1 capsule by mouth at bedtime.        .  cholecalciferol (VITAMIN D) 1000 UNITS tablet  Take 1,000 Units by mouth at bedtime.        .  colchicine 0.6 MG tablet  Take 0.6 mg by mouth daily as needed. For gout        .  cyanocobalamin 500 MCG tablet  Take 500 mcg by mouth daily.        Marland Kitchen  doxazosin (CARDURA) 8 MG tablet  Take 8 mg by mouth at bedtime.        .  furosemide (LASIX) 40 MG tablet  1 tablet by mouth daily as needed for swelling  30 tablet  6      .  levothyroxine (SYNTHROID, LEVOTHROID) 112 MCG tablet  Take 112 mcg by mouth every evening.        .  loratadine (CLARITIN) 10 MG tablet  Take 10 mg by mouth as needed.        .  metoprolol succinate (TOPROL-XL) 50 MG 24 hr tablet  Take 1 tablet (50 mg total) by mouth daily. Take with or immediately following a meal.  30 tablet  6      .  predniSONE (DELTASONE) 10 MG tablet  Take 10 mg by mouth daily as needed. Take as directed as needed for gout flare ups        .  vitamin C (ASCORBIC ACID) 500 MG tablet  Take 500 mg by mouth daily.        No current facility-administered medications for this visit.       Allergies       Allergen  Reactions       .  Penicillins  Shortness Of Breath and Rash         Ended up at ER after using       .  Oxycodone        Review of Systems negative except  from HPI and PMH  Physical Exam  BP 134/77  Pulse 119  Ht 5\' 9"  (1.753 m)  Wt 232 lb (105.235 kg)  BMI 34.24 kg/m2  Well developed and well nourished in no acute distress  HENT normal  E scleral and icterus clear  Neck Supple  carotids brisk and full  Clear to ausculation  Rapid and irregular rate and rhythm, no murmurs gallops or rub  Soft with active bowel sounds  No clubbing cyanosis 2+ Edema  Alert and oriented, grossly normal motor and sensory function  Skin Warm and Dry  Atrial fibrillation at 119  Intervals-/09/31  Intervals 22/09/36  Assessment and Plan  Sinus node dysfunction  Atrial fibrillation-paroxysmal  HFpEF  Obstructive sleep apnea-treated  Pacemaker-Medtronic  The patient's device was interrogated. The information was reviewed. No changes were made in the programming.  Paroxysms of atrial fibrillation continued to be problematic. He is currently persistently in  atrial fibrillation. This is associated with significant exercise intolerance. We discussed rate control versus rhythm control. We discussed for arrhythmic issues. He would like to pursue rhythm control. Will anticipate initiation of propafenone followed by cardioversion.  If this does not work we will pursue dofetilide and referral for catheter ablation  He is treated obstructive sleep apnea     For DCCV; patient has been taking abpixaban; no changes [name]

## 2014-04-09 NOTE — Anesthesia Postprocedure Evaluation (Signed)
  Anesthesia Post-op Note  Patient: Don Jacobs  Procedure(s) Performed: Procedure(s): CARDIOVERSION (N/A)  Patient Location: Endoscopy Unit  Anesthesia Type:MAC  Level of Consciousness: awake, alert  and oriented  Airway and Oxygen Therapy: Patient Spontanous Breathing and Patient connected to nasal cannula oxygen  Post-op Pain: none  Post-op Assessment: Post-op Vital signs reviewed, Patient's Cardiovascular Status Stable, Respiratory Function Stable, Patent Airway, No signs of Nausea or vomiting, Pain level controlled, Pain level not controlled, No headache, No backache, No residual numbness and No residual motor weakness  Post-op Vital Signs: Reviewed and stable  Last Vitals:  Filed Vitals:   04/09/14 1039  BP: 114/78  Pulse: 75  Temp:   Resp: 14    Complications: No apparent anesthesia complications

## 2014-04-09 NOTE — Anesthesia Preprocedure Evaluation (Addendum)
Anesthesia Evaluation  Patient identified by MRN, date of birth, ID band Patient awake    Reviewed: Allergy & Precautions, H&P , NPO status , Patient's Chart, lab work & pertinent test results  History of Anesthesia Complications Negative for: history of anesthetic complications  Airway Mallampati: II TM Distance: >3 FB Neck ROM: Full    Dental  (+) Edentulous Upper, Partial Lower   Pulmonary sleep apnea and Continuous Positive Airway Pressure Ventilation , former smoker,          Cardiovascular hypertension, Pt. on medications + Peripheral Vascular Disease + pacemaker     Neuro/Psych    GI/Hepatic negative GI ROS, Neg liver ROS,   Endo/Other  Hypothyroidism   Renal/GU negative Renal ROS     Musculoskeletal   Abdominal   Peds  Hematology   Anesthesia Other Findings   Reproductive/Obstetrics                          Anesthesia Physical Anesthesia Plan  ASA: III  Anesthesia Plan: MAC   Post-op Pain Management:    Induction: Intravenous  Airway Management Planned: Mask  Additional Equipment:   Intra-op Plan:   Post-operative Plan:   Informed Consent: I have reviewed the patients History and Physical, chart, labs and discussed the procedure including the risks, benefits and alternatives for the proposed anesthesia with the patient or authorized representative who has indicated his/her understanding and acceptance.   Dental advisory given  Plan Discussed with: CRNA, Anesthesiologist and Surgeon  Anesthesia Plan Comments:         Anesthesia Quick Evaluation

## 2014-04-09 NOTE — Procedures (Signed)
Electrical Cardioversion Procedure Note Don Jacobs [id number] 1935/12/01  Procedure: Electrical Cardioversion Indications:  Atrial Fibrillation  Procedure Details Consent: Risks of procedure as well as the alternatives and risks of each were explained to the (patient/caregiver).  Consent for procedure obtained. Time Out: Verified patient identification, verified procedure, site/side was marked, verified correct patient position, special equipment/implants available, medications/allergies/relevent history reviewed, required imaging and test results available.  Performed  Patient placed on cardiac monitor, pulse oximetry, supplemental oxygen as necessary.  Sedation given: Patient sedated by anesthesia with diprovan 90 mg IV. Pacer pads placed anterior and posterior chest.  Cardioverted 1 time(s).  Cardioverted at 120J.  Evaluation Findings: Post procedure EKG shows: NSR Complications: None Patient did tolerate procedure well.   [name] 04/09/2014, 10:23 AM

## 2014-04-09 NOTE — Transfer of Care (Signed)
Immediate Anesthesia Transfer of Care Note  Patient: Don Jacobs  Procedure(s) Performed: Procedure(s): CARDIOVERSION (N/A)  Patient Location: Endoscopy Unit  Anesthesia Type:MAC  Level of Consciousness: awake, alert , oriented and patient cooperative  Airway & Oxygen Therapy: Patient Spontanous Breathing and Patient connected to nasal cannula oxygen  Post-op Assessment: Report given to PACU RN, Post -op Vital signs reviewed and stable and Patient moving all extremities X 4  Post vital signs: Reviewed and stable  Complications: No apparent anesthesia complications

## 2014-04-09 NOTE — Discharge Instructions (Signed)
Electrical Cardioversion, Care After °Refer to this sheet in the next few weeks. These instructions provide you with information on caring for yourself after your procedure. Your health care provider may also give you more specific instructions. Your treatment has been planned according to current medical practices, but problems sometimes occur. Call your health care provider if you have any problems or questions after your procedure. °WHAT TO EXPECT AFTER THE PROCEDURE °After your procedure, it is typical to have the following sensations: °· Some redness on the skin where the shocks were delivered. If this is tender, a sunburn lotion or hydrocortisone cream may help. °· Possible return of an abnormal heart rhythm within hours or days after the procedure. °HOME CARE INSTRUCTIONS °· Only take medicine as directed by your health care provider. Be sure you understand how and when to take your medicine. °· Learn how to feel your pulse and check it often. °· Limit your activity for 48 hours after the procedure or as directed. °· Avoid or minimize caffeine and other stimulants as directed. °SEEK MEDICAL CARE IF: °· You feel like your heart is beating too fast or your pulse is not regular. °· You have any questions about your medicines. °· You have bleeding that will not stop. °SEEK IMMEDIATE MEDICAL CARE IF: °· You are dizzy or feel faint. °· It is hard to breathe or you feel short of breath. °· There is a change in discomfort in your chest. °· Your speech is slurred or you have trouble moving an arm or leg on one side of your body. °· You get a serious muscle cramp that does not go away. °· Your fingers or toes turn cold or blue. °MAKE SURE YOU:  °· Understand these instructions.   °· Will watch your condition.   °· Will get help right away if you are not doing well or get worse. °Document Released: 07/05/2013 Document Reviewed: 07/05/2013 °[employer]® Patient Information ©2015 [employer]. This information is not  intended to replace advice given to you by your health care provider. Make sure you discuss any questions you have with your health care provider. ° °

## 2014-04-10 ENCOUNTER — Ambulatory Visit (INDEPENDENT_AMBULATORY_CARE_PROVIDER_SITE_OTHER): Payer: Medicare Other

## 2014-04-10 ENCOUNTER — Encounter (HOSPITAL_COMMUNITY): Payer: Self-pay | Admitting: Cardiology

## 2014-04-10 ENCOUNTER — Telehealth: Payer: Self-pay | Admitting: Internal Medicine

## 2014-04-10 VITALS — Ht 69.0 in | Wt 231.0 lb

## 2014-04-10 DIAGNOSIS — I4891 Unspecified atrial fibrillation: Secondary | ICD-10-CM | POA: Diagnosis not present

## 2014-04-10 NOTE — Telephone Encounter (Signed)
Pt's wife called b/c patient had cardioversion done yesterday. Patient was very upset and concerned because patient has been in AFIB since procedure. Called and talked with Trinidad Curet since this was a Dr Caryl Comes patient, she ask that I send the call to Triage. I called and spoke to [facility] in triage she took the call.

## 2014-04-10 NOTE — Telephone Encounter (Signed)
**Note De-Identified Noga Fogg Obfuscation** The pts wife was advised to bring the pt to the office for an EKG, she agreed.   EKG obtained and given to Dr Caryl Comes for his review.  Please see nurse visit notes from today.

## 2014-04-10 NOTE — Progress Notes (Signed)
1.) Reason for visit: Pt was cardioverted yesterday and woke up this am with skipping HR, SOB and some dizziness.  2.) Name of MD requesting visit: Dr Caryl Comes  3.) H&P: Pt had successful cardioversion yesterday   4.) ROS related to problem: HX of Atrial Fib  5.) Assessment and plan per MD: Per Dr Caryl Comes the pt is advised that he is in sinus rhythm with pac's and to continue current medical treatment and to call office if he has any more questions or concerns, he verbalized understanding.

## 2014-04-12 ENCOUNTER — Encounter: Payer: Self-pay | Admitting: Internal Medicine

## 2014-04-12 DIAGNOSIS — E559 Vitamin D deficiency, unspecified: Secondary | ICD-10-CM | POA: Diagnosis not present

## 2014-04-12 DIAGNOSIS — R609 Edema, unspecified: Secondary | ICD-10-CM | POA: Diagnosis not present

## 2014-04-12 DIAGNOSIS — R5383 Other fatigue: Secondary | ICD-10-CM | POA: Diagnosis not present

## 2014-04-12 DIAGNOSIS — E538 Deficiency of other specified B group vitamins: Secondary | ICD-10-CM | POA: Diagnosis not present

## 2014-04-12 DIAGNOSIS — I10 Essential (primary) hypertension: Secondary | ICD-10-CM | POA: Diagnosis not present

## 2014-04-12 DIAGNOSIS — R5381 Other malaise: Secondary | ICD-10-CM | POA: Diagnosis not present

## 2014-04-12 DIAGNOSIS — I4891 Unspecified atrial fibrillation: Secondary | ICD-10-CM | POA: Diagnosis not present

## 2014-04-12 DIAGNOSIS — M1710 Unilateral primary osteoarthritis, unspecified knee: Secondary | ICD-10-CM | POA: Diagnosis not present

## 2014-04-12 DIAGNOSIS — E039 Hypothyroidism, unspecified: Secondary | ICD-10-CM | POA: Diagnosis not present

## 2014-04-12 DIAGNOSIS — IMO0002 Reserved for concepts with insufficient information to code with codable children: Secondary | ICD-10-CM | POA: Diagnosis not present

## 2014-04-12 DIAGNOSIS — R29898 Other symptoms and signs involving the musculoskeletal system: Secondary | ICD-10-CM | POA: Diagnosis not present

## 2014-04-13 ENCOUNTER — Telehealth: Payer: Self-pay | Admitting: Internal Medicine

## 2014-04-13 NOTE — Telephone Encounter (Signed)
Called Dr. Inda Merlin office and had them fax actual EKG.  Dr. Caryl Comes reviewed -- A fib.   Discussed options of increasing Propafenone vs Tikosyn loading. Pt chooses to increase Propafenone to TID and see how things go over the weekend. They will call Monday if issues remain. Advised to go to ED if symptoms worsen/problematic over the weekend. They will call Monday if need further advisement.

## 2014-04-13 NOTE — Telephone Encounter (Signed)
New message     Did we get the ekg faxed over from Dr Inda Merlin?  Pt is still in AFIB. Last night pulse was 38-42 and he had some sob last night. Since the weekend is here--wife want to know what she should do about the AFIB/

## 2014-04-16 ENCOUNTER — Telehealth: Payer: Self-pay | Admitting: Internal Medicine

## 2014-04-16 NOTE — Telephone Encounter (Signed)
Pt's wife wanted to inform us that pt has slightly improved since increasing Propafenone to TID. SOB has improved. Although pt still has some, it is better. HR maintaining less than 100. Pt will try and resume normal ADLs to see how he responds.  They will call if issues arise, or problems with ADLs.

## 2014-04-16 NOTE — Telephone Encounter (Signed)
New message ° ° ° ° ° °Talk to Sherri----she would not tell me what she wanted °

## 2014-04-23 ENCOUNTER — Other Ambulatory Visit: Payer: Self-pay

## 2014-04-23 MED ORDER — PROPAFENONE HCL 225 MG PO TABS: 225.0000 mg | ORAL_TABLET | Freq: Three times a day (TID) | ORAL | Status: DC

## 2014-04-25 ENCOUNTER — Telehealth: Payer: Self-pay | Admitting: Interventional Cardiology

## 2014-04-25 MED ORDER — PROPAFENONE HCL 225 MG PO TABS: 225.0000 mg | ORAL_TABLET | Freq: Three times a day (TID) | ORAL | Status: DC

## 2014-04-25 NOTE — Addendum Note (Signed)
Addended byUlla Potash H on: 04/25/2014 03:59 PM   Modules accepted: Orders

## 2014-04-25 NOTE — Telephone Encounter (Signed)
New message     Please fax rhythmol presc to the [place].  Their fax number is [phone]  Attn Dr Harrie Foreman

## 2014-04-25 NOTE — Telephone Encounter (Signed)
Rx printed and put in nurse fax to be faxed.

## 2014-05-05 ENCOUNTER — Encounter: Payer: Self-pay | Admitting: Interventional Cardiology

## 2014-05-14 ENCOUNTER — Other Ambulatory Visit: Payer: Self-pay

## 2014-05-14 MED ORDER — PROPAFENONE HCL 225 MG PO TABS: 225.0000 mg | ORAL_TABLET | Freq: Three times a day (TID) | ORAL | Status: DC

## 2014-05-30 ENCOUNTER — Telehealth: Payer: Self-pay | Admitting: Internal Medicine

## 2014-05-30 DIAGNOSIS — Z5189 Encounter for other specified aftercare: Secondary | ICD-10-CM | POA: Diagnosis not present

## 2014-05-30 DIAGNOSIS — S83289A Other tear of lateral meniscus, current injury, unspecified knee, initial encounter: Secondary | ICD-10-CM | POA: Diagnosis not present

## 2014-05-30 DIAGNOSIS — Z01818 Encounter for other preprocedural examination: Secondary | ICD-10-CM | POA: Diagnosis not present

## 2014-05-30 NOTE — Telephone Encounter (Signed)
New message     Need clearance for knee surgery on 06-08-14  (Dr Onnie Graham in [place]).  When should pt stop eliquis?

## 2014-05-31 NOTE — Telephone Encounter (Signed)
Follow Up  Pt request a call back to discuss if the pt should come off of Eliquis  Please fax this information to  Fax #  [facility]   [phone]

## 2014-06-01 DIAGNOSIS — I10 Essential (primary) hypertension: Secondary | ICD-10-CM | POA: Diagnosis not present

## 2014-06-01 DIAGNOSIS — Z79899 Other long term (current) drug therapy: Secondary | ICD-10-CM | POA: Diagnosis not present

## 2014-06-01 DIAGNOSIS — G4733 Obstructive sleep apnea (adult) (pediatric): Secondary | ICD-10-CM | POA: Diagnosis not present

## 2014-06-01 DIAGNOSIS — N400 Enlarged prostate without lower urinary tract symptoms: Secondary | ICD-10-CM | POA: Diagnosis not present

## 2014-06-01 DIAGNOSIS — M2390 Unspecified internal derangement of unspecified knee: Secondary | ICD-10-CM | POA: Diagnosis not present

## 2014-06-01 NOTE — Telephone Encounter (Signed)
F/u   Pt calling about previous message. And also pre admission need a fax [phone]

## 2014-06-01 NOTE — Telephone Encounter (Signed)
Explained that I would have Dr. Caryl Comes review this. He proceeded to tell me that knee surgeon wanted him to stop Eliquis 7 days before procedure - I informed him that he would only need to stop in 48 hours prior to surgery (per pharmacy). I told him that I would call him Tuesday once reviewed by Dr. Caryl Comes and he is agreeable to this. He was also glad to know that he didn't need to stop it for 7 days.

## 2014-06-02 DIAGNOSIS — R350 Frequency of micturition: Secondary | ICD-10-CM | POA: Insufficient documentation

## 2014-06-02 DIAGNOSIS — R1030 Lower abdominal pain, unspecified: Secondary | ICD-10-CM | POA: Insufficient documentation

## 2014-06-02 DIAGNOSIS — N529 Male erectile dysfunction, unspecified: Secondary | ICD-10-CM | POA: Insufficient documentation

## 2014-06-02 DIAGNOSIS — N200 Calculus of kidney: Secondary | ICD-10-CM | POA: Insufficient documentation

## 2014-06-02 DIAGNOSIS — N281 Cyst of kidney, acquired: Secondary | ICD-10-CM | POA: Insufficient documentation

## 2014-06-02 DIAGNOSIS — M545 Low back pain, unspecified: Secondary | ICD-10-CM | POA: Insufficient documentation

## 2014-06-02 DIAGNOSIS — N138 Other obstructive and reflux uropathy: Secondary | ICD-10-CM | POA: Insufficient documentation

## 2014-06-02 DIAGNOSIS — G4730 Sleep apnea, unspecified: Secondary | ICD-10-CM | POA: Insufficient documentation

## 2014-06-02 DIAGNOSIS — N401 Enlarged prostate with lower urinary tract symptoms: Secondary | ICD-10-CM | POA: Insufficient documentation

## 2014-06-05 NOTE — Telephone Encounter (Signed)
Pt notified.   Faxed info to   [facility]  [phone]

## 2014-06-05 NOTE — Telephone Encounter (Signed)
lmtrc

## 2014-06-05 NOTE — Telephone Encounter (Signed)
OK with me to stop or 2 days only.  7 days increases his risk of stroke more than a shorter duration off of Xarelto. If surgeon wants to be absolutely certain, could stop 3 days.

## 2014-06-05 NOTE — Telephone Encounter (Signed)
OK with me to stop for 2 days only. 7 days increases his risk of stroke more than a shorter duration off of Eliquis. If surgeon wants to be absolutely certain, could stop 3 days.

## 2014-06-08 DIAGNOSIS — I10 Essential (primary) hypertension: Secondary | ICD-10-CM | POA: Diagnosis not present

## 2014-06-08 DIAGNOSIS — Z79899 Other long term (current) drug therapy: Secondary | ICD-10-CM | POA: Diagnosis not present

## 2014-06-08 DIAGNOSIS — N400 Enlarged prostate without lower urinary tract symptoms: Secondary | ICD-10-CM | POA: Diagnosis not present

## 2014-06-08 DIAGNOSIS — M2390 Unspecified internal derangement of unspecified knee: Secondary | ICD-10-CM | POA: Diagnosis not present

## 2014-06-08 DIAGNOSIS — G4733 Obstructive sleep apnea (adult) (pediatric): Secondary | ICD-10-CM | POA: Diagnosis not present

## 2014-06-08 DIAGNOSIS — IMO0002 Reserved for concepts with insufficient information to code with codable children: Secondary | ICD-10-CM | POA: Diagnosis not present

## 2014-06-08 DIAGNOSIS — S83289A Other tear of lateral meniscus, current injury, unspecified knee, initial encounter: Secondary | ICD-10-CM | POA: Diagnosis not present

## 2014-06-13 ENCOUNTER — Other Ambulatory Visit: Payer: Self-pay | Admitting: *Deleted

## 2014-06-13 DIAGNOSIS — M25569 Pain in unspecified knee: Secondary | ICD-10-CM | POA: Diagnosis not present

## 2014-06-13 DIAGNOSIS — I719 Aortic aneurysm of unspecified site, without rupture: Secondary | ICD-10-CM

## 2014-06-13 DIAGNOSIS — M25669 Stiffness of unspecified knee, not elsewhere classified: Secondary | ICD-10-CM | POA: Diagnosis not present

## 2014-06-13 DIAGNOSIS — M6281 Muscle weakness (generalized): Secondary | ICD-10-CM | POA: Diagnosis not present

## 2014-06-18 ENCOUNTER — Encounter: Payer: Self-pay | Admitting: Interventional Cardiology

## 2014-06-18 ENCOUNTER — Other Ambulatory Visit: Payer: Self-pay | Admitting: *Deleted

## 2014-06-18 ENCOUNTER — Ambulatory Visit (INDEPENDENT_AMBULATORY_CARE_PROVIDER_SITE_OTHER): Payer: Medicare Other | Admitting: Interventional Cardiology

## 2014-06-18 VITALS — BP 118/68 | HR 102 | Ht 69.0 in | Wt 231.0 lb

## 2014-06-18 DIAGNOSIS — I4891 Unspecified atrial fibrillation: Secondary | ICD-10-CM

## 2014-06-18 DIAGNOSIS — R609 Edema, unspecified: Secondary | ICD-10-CM

## 2014-06-18 DIAGNOSIS — I10 Essential (primary) hypertension: Secondary | ICD-10-CM

## 2014-06-18 DIAGNOSIS — I719 Aortic aneurysm of unspecified site, without rupture: Secondary | ICD-10-CM

## 2014-06-18 DIAGNOSIS — E669 Obesity, unspecified: Secondary | ICD-10-CM | POA: Diagnosis not present

## 2014-06-18 DIAGNOSIS — I4819 Other persistent atrial fibrillation: Secondary | ICD-10-CM

## 2014-06-18 NOTE — Progress Notes (Signed)
Patient ID: Don Jacobs, male   DOB: 11/09/1935, 78 y.o.   MRN: [id number]    [address], [place], [place]  [address] Phone: [phone] Fax:  [phone]  Date:  06/18/2014   ID:  Don Jacobs, DOB 1935/10/20, MRN [id number]  PCP:  Henrine Screws, MD      History of Present Illness: Don Jacobs is a 78 y.o. male who has had PAF and was on Coumadin. He had a fall and an ICH affecting the right temporal lobe in 2014. He was admitted to [facility]. While there, he had significant bradycardia with HR dropping to as low as 29 bpm. He eventually had a pacemaker placed. Although it was thought to be safe to go back on anticoagulation from his neurosurgeon, he did not want to take this medicine.   He began having more atrial fibrillation. His heart rate was in the 130s by monitor.  Medications were adjusted. He ultimately saw Dr. Caryl Comes. Rythmol was started for rhythm control. The patient underwent cardioversion but normal sinus rhythm did not last for more than a day.  Since that time, he has been rate controlling his atrial fibrillation. He feels that his energy is most limited by lack of activity since his recent knee surgery. He has minimal palpitations. He is somewhat undecided about pursuing normal sinus rhythm. He denies chest discomfort or resting shortness of breath. He wants to try to lose weight.   Wt Readings from Last 3 Encounters:  06/18/14 231 lb (104.781 kg)  04/10/14 231 lb (104.781 kg)  04/09/14 226 lb (102.513 kg)     Past Medical History  Diagnosis Date  . Hypertension   . A-fib   . Sleep apnea   . Gout   . Hypothyroidism   . Allergic rhinitis   . ED (erectile dysfunction)   . BPH (benign prostatic hyperplasia)   . Hearing loss     uses hearing a cyst  . Hx of migraines   . Prostatitis, chronic   . Spondylolisthesis 07/2010  . Seborrheic keratosis   . Aortic aneurysm, thoracic 05/25/2011  . Pacemaker     Current Outpatient  Prescriptions  Medication Sig Dispense Refill  . allopurinol (ZYLOPRIM) 300 MG tablet Take 300 mg by mouth at bedtime.       Marland Kitchen apixaban (ELIQUIS) 5 MG TABS tablet Take 1 tablet (5 mg total) by mouth 2 (two) times daily.  180 tablet  3  . Calcium Carbonate (CALTRATE 600 PO) Take 600 mg by mouth at bedtime.       . cholecalciferol (VITAMIN D) 1000 UNITS tablet Take 1,000 Units by mouth at bedtime.      . colchicine 0.6 MG tablet Take 0.6 mg by mouth daily as needed (gout).       . cyanocobalamin 500 MCG tablet Take 500 mcg by mouth daily.      Marland Kitchen doxazosin (CARDURA) 8 MG tablet Take 8 mg by mouth at bedtime.        . furosemide (LASIX) 40 MG tablet Take 40 mg by mouth daily as needed (swelling).      Marland Kitchen levothyroxine (SYNTHROID, LEVOTHROID) 112 MCG tablet Take 112 mcg by mouth every evening.       . loratadine (CLARITIN) 10 MG tablet Take 10 mg by mouth daily.       . predniSONE (DELTASONE) 10 MG tablet Take 10 mg by mouth daily as needed (gout flare up).       Marland Kitchen  propafenone (RYTHMOL) 225 MG tablet Take 1 tablet (225 mg total) by mouth 3 (three) times daily.  270 tablet  2  . vitamin C (ASCORBIC ACID) 500 MG tablet Take 500 mg by mouth daily.        No current facility-administered medications for this visit.    Allergies:    Allergies  Allergen Reactions  . Penicillins Shortness Of Breath and Rash    Ended up at ER after using  . Oxycodone     Altered mental status    Social History:  The patient  reports that he quit smoking about 33 years ago. His smoking use included Cigarettes. He has a 12 pack-year smoking history. He has never used smokeless tobacco. He reports that he does not drink alcohol or use illicit drugs.   Family History:  The patient's family history includes Heart disease in his father.   ROS:  Please see the history of present illness.  No nausea, vomiting.  No fevers, chills.  No focal weakness.  No dysuria. Minimal palpitations. Knee pain post surgery.   All other  systems reviewed and negative.   PHYSICAL EXAM: VS:  BP 118/68  Pulse 102  Ht 5\' 9"  (1.753 m)  Wt 231 lb (104.781 kg)  BMI 34.10 kg/m2 Well nourished, well developed, in no acute distress HEENT: normal Neck: no JVD, no carotid bruits Cardiac:  normal S1, S2; irregularly irregular Lungs:  clear to auscultation bilaterally, no wheezing, rhonchi or rales Abd: soft, nontender, no hepatomegaly Ext: no edema Skin: warm and dry Neuro:   no focal abnormalities noted  EKG:       ASSESSMENT AND PLAN:  1. Atrial flutter: started Eliquis 5 mg BID; off of diltiazem daily due to swelling on the higher dose.   I asked him and his wife to discuss over the next few days whether or not he would like to pursue sinus rhythm. It appears that he is in atrial fibrillation all the time. Appear to be very symptomatic. According to Dr. Caryl Comes, the plan was to potentially try Tikosyn if Rythmol/cardioversion did not maintain sinus rhythm, which it didn't. Will check with Dr. Caryl Comes as to further management. I would think that we could stop Rythmol since it does not seem to be working. He can go an another rate control medicine since his resting heart rate is in the high 90s. If the patient decides that he feels better in sinus rhythm, then could pursue tikosyn. I explained him that he would have to be admitted for 3 days to start this medicine.  Dr. Caryl Comes also mentioned the possibility of ablation in the future. The first thing that needs to be decided would be whether or not the patient wants to pursue normal sinus rhythm.    2. Aortic aneurysm of unspecified site without mention of rupture  Notes: Follows with Dr. Cyndia Bent. This has been stable.   3. Edema: Elevate legs at night. Likely venous insufficiency. Using compression stockings. TSH normal in 12/14.   Obesity: He had lost some weight , but has gained some back since his last visit. I encourage him to try to eat right and lose further weight.  Increase  exercise as well.   Signed, Mina Marble, MD, [facility] 06/18/2014 9:55 AM

## 2014-06-18 NOTE — Patient Instructions (Signed)
Your physician wants you to follow-up in: 6 months with Dr. Irish Lack.  You will receive a reminder letter in the mail two months in advance. If you don't receive a letter, please call our office to schedule the follow-up appointment.  Dr. Irish Lack will discuss treatment of Atrial fib with Dr. Caryl Comes and someone from our office will get back in touch with you.

## 2014-06-20 DIAGNOSIS — M6281 Muscle weakness (generalized): Secondary | ICD-10-CM | POA: Diagnosis not present

## 2014-06-20 DIAGNOSIS — M25569 Pain in unspecified knee: Secondary | ICD-10-CM | POA: Diagnosis not present

## 2014-06-20 DIAGNOSIS — M25669 Stiffness of unspecified knee, not elsewhere classified: Secondary | ICD-10-CM | POA: Diagnosis not present

## 2014-07-03 ENCOUNTER — Telehealth: Payer: Self-pay | Admitting: Interventional Cardiology

## 2014-07-03 NOTE — Telephone Encounter (Signed)
OK to stop rhythmol.  Has he decided about pursuing Rx for NSR, like Tikosyn?  VARANASI,JAYADEEP S.

## 2014-07-03 NOTE — Telephone Encounter (Signed)
Lmtrc

## 2014-07-04 ENCOUNTER — Telehealth: Payer: Self-pay | Admitting: Interventional Cardiology

## 2014-07-04 NOTE — Telephone Encounter (Signed)
Pt is aware to stop taking Rythmol medication. Pt said he will not be taking any rate control medication now . He is willing  to try another medication like Tikosyn.

## 2014-07-04 NOTE — Telephone Encounter (Signed)
New message     Returning Don Jacobs's call from yesterday

## 2014-07-05 MED ORDER — PROPAFENONE HCL 225 MG PO TABS: 225.0000 mg | ORAL_TABLET | Freq: Three times a day (TID) | ORAL | Status: DC

## 2014-07-05 NOTE — Telephone Encounter (Signed)
See other telephone note.  

## 2014-07-05 NOTE — Telephone Encounter (Signed)
Pt states he can stop Rhythmol but he feels better on this medication than he did other medications. He know that he still has some afib at times but he states he can live with it. Pt states the Tikosyn would cost him $600.00 and the [place] will not supply it, therefore, he will not be able to take it. He still has Diltiazem HCl 240 mg and 60 mg on hand.

## 2014-07-05 NOTE — Telephone Encounter (Signed)
Follow up  ° ° ° °Returning call back to nurse  °

## 2014-07-05 NOTE — Telephone Encounter (Signed)
Spoke with pt and he will check to see what the cost of Tikosyn would be. He is going to think about it, but he wants to wait until after he sees the [facility] [place] next week to make a decision.

## 2014-07-05 NOTE — Telephone Encounter (Signed)
lmtrc

## 2014-07-06 ENCOUNTER — Other Ambulatory Visit: Payer: Self-pay | Admitting: *Deleted

## 2014-07-06 DIAGNOSIS — I712 Thoracic aortic aneurysm, without rupture, unspecified: Secondary | ICD-10-CM

## 2014-07-06 DIAGNOSIS — I719 Aortic aneurysm of unspecified site, without rupture: Secondary | ICD-10-CM

## 2014-07-09 ENCOUNTER — Ambulatory Visit (INDEPENDENT_AMBULATORY_CARE_PROVIDER_SITE_OTHER): Payer: Medicare Other | Admitting: *Deleted

## 2014-07-09 ENCOUNTER — Encounter: Payer: Self-pay | Admitting: Internal Medicine

## 2014-07-09 DIAGNOSIS — R001 Bradycardia, unspecified: Secondary | ICD-10-CM | POA: Diagnosis not present

## 2014-07-09 LAB — MDC_IDC_ENUM_SESS_TYPE_REMOTE
Brady Statistic AP VP Percent: 0.13 %
Brady Statistic AP VS Percent: 2.59 %
Brady Statistic AS VS Percent: 88.28 %
Brady Statistic RV Percent Paced: 9.14 %
Date Time Interrogation Session: 20151012135546
Lead Channel Impedance Value: 342 Ohm
Lead Channel Impedance Value: 494 Ohm
Lead Channel Sensing Intrinsic Amplitude: 0.75 mV
Lead Channel Setting Pacing Amplitude: 2.5 V
Lead Channel Setting Sensing Sensitivity: 4 mV
MDC IDC MSMT BATTERY REMAINING LONGEVITY: 78 mo
MDC IDC MSMT BATTERY VOLTAGE: 3 V
MDC IDC MSMT LEADCHNL RA IMPEDANCE VALUE: 304 Ohm
MDC IDC MSMT LEADCHNL RV IMPEDANCE VALUE: 437 Ohm
MDC IDC MSMT LEADCHNL RV SENSING INTR AMPL: 8.625 mV
MDC IDC SET LEADCHNL RA PACING AMPLITUDE: 2 V
MDC IDC SET LEADCHNL RV PACING PULSEWIDTH: 0.4 ms
MDC IDC STAT BRADY AS VP PERCENT: 9 %
MDC IDC STAT BRADY RA PERCENT PACED: 2.72 %
Zone Setting Detection Interval: 350 ms
Zone Setting Detection Interval: 400 ms

## 2014-07-09 NOTE — Progress Notes (Signed)
Remote pacemaker transmission.   

## 2014-07-12 NOTE — Telephone Encounter (Signed)
Attempted to call patient. Voicemail did not identify him.

## 2014-07-12 NOTE — Telephone Encounter (Signed)
I think he is better off to stop the rhythmol.  Would start Diltiazem 180 mg daily as a substitute.  Dr. Caryl Comes agrees with stopping the rhythmol as well.

## 2014-07-13 ENCOUNTER — Other Ambulatory Visit: Payer: Self-pay

## 2014-07-13 ENCOUNTER — Telehealth: Payer: Self-pay | Admitting: Interventional Cardiology

## 2014-07-13 DIAGNOSIS — I719 Aortic aneurysm of unspecified site, without rupture: Secondary | ICD-10-CM | POA: Diagnosis not present

## 2014-07-13 LAB — CREATININE, SERUM: Creat: 1.1 mg/dL (ref 0.50–1.35)

## 2014-07-13 LAB — BUN: BUN: 17 mg/dL (ref 6–23)

## 2014-07-13 MED ORDER — DILTIAZEM HCL ER COATED BEADS 180 MG PO CP24: 180.0000 mg | ORAL_CAPSULE | Freq: Every day | ORAL | Status: DC

## 2014-07-13 NOTE — Telephone Encounter (Signed)
Per Dr. Hassell Done prior phone note from 10/15, pt is "to stop the rhythmol. Would start Diltiazem 180 mg daily as a substitute." Informed patient that Dr. Caryl Comes agrees with stopping the rhythmol as well. Pt agrees to STOP Rhythmol. Informed patient Dilt 180mg  Rx is being sent to pharmacy.  Patient agrees with new treatment plan.

## 2014-07-13 NOTE — Telephone Encounter (Signed)
Follow up ° ° ° ° ° °Returning Katy's call °

## 2014-07-13 NOTE — Telephone Encounter (Signed)
Left message for patient not to return call. Previous call was in error.

## 2014-07-13 NOTE — Telephone Encounter (Signed)
Follow up    Patient calling back regarding previous message with nurse & new medication.

## 2014-07-13 NOTE — Telephone Encounter (Signed)
Patient clarifying new orders from this morning. Reiterated with patient to STOP Rhythmol. Instructed patient to START Diltiazem 180 mg daily. Patient expresses understanding.

## 2014-07-13 NOTE — Telephone Encounter (Signed)
LM to call back [phone] Dr Hassell Done office.

## 2014-07-13 NOTE — Telephone Encounter (Signed)
°  Patient is returning call, please call back.

## 2014-07-17 NOTE — Telephone Encounter (Signed)
This encounter has already been resolved. The patient is taking Diltiazem 180 mg daily without any problems. Will call us if he does.

## 2014-07-18 ENCOUNTER — Encounter: Payer: Self-pay | Admitting: Surgery

## 2014-07-18 ENCOUNTER — Ambulatory Visit
Admission: RE | Admit: 2014-07-18 | Discharge: 2014-07-18 | Disposition: A | Discharge status: Home / Self Care | Payer: Medicare Other | Source: Ambulatory Visit | Attending: Surgery | Admitting: Surgery

## 2014-07-18 ENCOUNTER — Ambulatory Visit (INDEPENDENT_AMBULATORY_CARE_PROVIDER_SITE_OTHER): Payer: Medicare Other | Admitting: Surgery

## 2014-07-18 VITALS — BP 115/73 | HR 86 | Ht 69.0 in | Wt 213.0 lb

## 2014-07-18 DIAGNOSIS — I719 Aortic aneurysm of unspecified site, without rupture: Secondary | ICD-10-CM

## 2014-07-18 DIAGNOSIS — I7121 Aneurysm of the ascending aorta, without rupture: Secondary | ICD-10-CM

## 2014-07-18 DIAGNOSIS — I712 Thoracic aortic aneurysm, without rupture: Secondary | ICD-10-CM | POA: Diagnosis not present

## 2014-07-18 MED ORDER — IOHEXOL 350 MG/ML SOLN
80.0000 mL | Freq: Once | INTRAVENOUS | Status: AC | PRN
  Administered 2014-07-18: 80 mL via INTRAVENOUS

## 2014-07-19 ENCOUNTER — Encounter: Payer: Self-pay | Admitting: Surgery

## 2014-07-19 NOTE — Progress Notes (Signed)
HPI:  Patient returns today for followup of an ascending aortic aneurysm that has stable at 4.7 cm since I saw him in September 2013. He denies any chest pain or pressure.    Current Outpatient Prescriptions  Medication Sig Dispense Refill  . allopurinol (ZYLOPRIM) 300 MG tablet Take 300 mg by mouth at bedtime.       Marland Kitchen apixaban (ELIQUIS) 5 MG TABS tablet Take 1 tablet (5 mg total) by mouth 2 (two) times daily.  180 tablet  3  . Calcium Carbonate (CALTRATE 600 PO) Take 600 mg by mouth at bedtime.       . cholecalciferol (VITAMIN D) 1000 UNITS tablet Take 1,000 Units by mouth at bedtime.      . colchicine 0.6 MG tablet Take 0.6 mg by mouth daily as needed (gout).       . cyanocobalamin 500 MCG tablet Take 500 mcg by mouth daily.      Marland Kitchen diltiazem (CARDIZEM CD) 180 MG 24 hr capsule Take 1 capsule (180 mg total) by mouth daily.  30 capsule  1  . doxazosin (CARDURA) 8 MG tablet Take 8 mg by mouth at bedtime.        . furosemide (LASIX) 40 MG tablet Take 40 mg by mouth daily as needed (swelling).      Marland Kitchen levothyroxine (SYNTHROID, LEVOTHROID) 112 MCG tablet Take 112 mcg by mouth every evening.       . loratadine (CLARITIN) 10 MG tablet Take 10 mg by mouth daily.       . predniSONE (DELTASONE) 10 MG tablet Take 10 mg by mouth daily as needed (gout flare up).       . vitamin C (ASCORBIC ACID) 500 MG tablet Take 500 mg by mouth daily.        No current facility-administered medications for this visit.     Physical Exam:  BP 115/73  Pulse 86  Ht 5\' 9"  (1.753 m)  Wt 213 lb (96.616 kg)  BMI 31.44 kg/m2  SpO2 96% He looks well.  Cardiac exam shows a regular rate and rhythm with normal S1 and S2. There is no murmur, rub, or gallop.  Lung exam is clear.    Diagnostic Tests:  CLINICAL DATA: Follow-up ascending thoracic aneurysm.  EXAM:  CT ANGIOGRAPHY CHEST WITH CONTRAST  TECHNIQUE:  Multidetector CT imaging of the chest was performed using the  standard protocol during bolus  administration of intravenous  contrast. Multiplanar CT image reconstructions and MIPs were  obtained to evaluate the vascular anatomy.  CONTRAST: 14mL OMNIPAQUE IOHEXOL 350 MG/ML SOLN  COMPARISON: 07/19/2013  FINDINGS:  Aneurysmal dilatation of the proximal ascending aorta measures up to  4.7-4.8 cm in diameter, stable to minimally increased from prior  (previously 4.6-4.7 cm). Aorta measures 4.7 cm at the sinuses of  Valsalva and approximately 4.0 cm at the sino-tubular junction.  Proximal aortic arch measures 4.0 cm (3.9 cm), distal aortic arch  measures 3.6 cm (previously 3.6 cm) and descending thoracic aorta  measures 3.1 cm (previously 3.0 cm). Heart is enlarged. Pacemaker  leads are noted terminating in the right atrium and right ventricle.  There is a small pericardial effusion, slightly increased in size.  No enlarged axillary, mediastinal, or hilar lymph nodes are  identified.  There is no pleural effusion. 3 mm nodule in the inferior right  upper lobe is unchanged from 2011 and benign (series 5, image 27). 3  mm basilar right lower lobe nodule just above the hemidiaphragm  is  not clearly seen on the 2011 study but is unchanged from the more  recent 2014 examination (series 5, image 43). 3 mm superior segment  right lower lobe nodule is also unchanged (series 5, image 35), as  are more punctate nodules along the superior aspect of the right  major fissure. Dependent subsegmental atelectasis and mild scarring  are noted in the lung bases.  Hypodense lesions arising from the upper pole of the right kidney  are again partially visualized and grossly similar to the prior  study, likely cysts but incompletely evaluated. There is suggestion  of a small lipomatous mass associated with the left adrenal gland on  axial images, however this is not confirmed on other projections.  Diffuse bridging osteophytosis is present in the thoracic spine.  Anterior cervical fusion hardware is  partially visualized.  Review of the MIP images confirms the above findings.  IMPRESSION:  1. Stable to minimally increased size of ascending aortic aneurysm.  2. Mildly increased size of small pericardial effusion.  Electronically Signed  By: Logan Bores  On: 07/18/2014 14:05    Impression:  I think this aneurysm looks unchanged in size at 4.7 cm. There is no indication for intervention at this time. I reviewed the CT with him and his wife and answered their questions. He is in agreement with continued follow up.  Plan:  I will see him back in one year with a CTA of the chest.

## 2014-07-23 ENCOUNTER — Telehealth: Payer: Self-pay | Admitting: Interventional Cardiology

## 2014-07-23 MED ORDER — DILTIAZEM HCL ER COATED BEADS 180 MG PO CP24: 180.0000 mg | ORAL_CAPSULE | Freq: Every day | ORAL | Status: DC

## 2014-07-23 NOTE — Telephone Encounter (Signed)
Pt requesting written prescription for Diltiazem CD 180mg  daily #90 to mail to [facility] [place]. Pt advised Dr Irish Lack will be in office Wed 07/25/14. Pt requesting prescription be left at front desk for him to pick up Thursday morning 07/26/14 when he is in [place]. Will leave prescription on Dr Hassell Done cart for signature.

## 2014-07-23 NOTE — Telephone Encounter (Signed)
New message          Pt would like for heather to give him a call / pt needs a copy of the prescription in the mail so he can give it to the [place] next week

## 2014-07-24 DIAGNOSIS — Z4789 Encounter for other orthopedic aftercare: Secondary | ICD-10-CM | POA: Diagnosis not present

## 2014-08-01 ENCOUNTER — Encounter: Payer: Self-pay | Admitting: Cardiology

## 2014-08-29 DIAGNOSIS — M109 Gout, unspecified: Secondary | ICD-10-CM | POA: Diagnosis not present

## 2014-08-29 DIAGNOSIS — Z1389 Encounter for screening for other disorder: Secondary | ICD-10-CM | POA: Diagnosis not present

## 2014-08-29 DIAGNOSIS — Z23 Encounter for immunization: Secondary | ICD-10-CM | POA: Diagnosis not present

## 2014-08-29 DIAGNOSIS — H9190 Unspecified hearing loss, unspecified ear: Secondary | ICD-10-CM | POA: Diagnosis not present

## 2014-08-29 DIAGNOSIS — N529 Male erectile dysfunction, unspecified: Secondary | ICD-10-CM | POA: Diagnosis not present

## 2014-08-29 DIAGNOSIS — I719 Aortic aneurysm of unspecified site, without rupture: Secondary | ICD-10-CM | POA: Diagnosis not present

## 2014-08-29 DIAGNOSIS — K5730 Diverticulosis of large intestine without perforation or abscess without bleeding: Secondary | ICD-10-CM | POA: Diagnosis not present

## 2014-08-29 DIAGNOSIS — Z0001 Encounter for general adult medical examination with abnormal findings: Secondary | ICD-10-CM | POA: Diagnosis not present

## 2014-09-06 ENCOUNTER — Encounter (HOSPITAL_COMMUNITY): Payer: Self-pay | Admitting: Internal Medicine

## 2014-09-12 DIAGNOSIS — M1711 Unilateral primary osteoarthritis, right knee: Secondary | ICD-10-CM | POA: Diagnosis not present

## 2014-09-25 ENCOUNTER — Encounter: Payer: Self-pay | Admitting: Interventional Cardiology

## 2014-09-25 ENCOUNTER — Encounter: Payer: Self-pay | Admitting: Internal Medicine

## 2014-09-25 ENCOUNTER — Ambulatory Visit (INDEPENDENT_AMBULATORY_CARE_PROVIDER_SITE_OTHER): Payer: Medicare Other | Admitting: Internal Medicine

## 2014-09-25 VITALS — BP 118/68 | HR 87 | Ht 69.0 in | Wt 228.0 lb

## 2014-09-25 DIAGNOSIS — G4733 Obstructive sleep apnea (adult) (pediatric): Secondary | ICD-10-CM

## 2014-09-25 NOTE — Patient Instructions (Signed)
We can continue CPAP 12/ [facility]  You can ask [facility] when you may be able to get a new machine  Order- D[facility]- replacement CPAP mask of choice and supplies   Dx OSA

## 2014-09-25 NOTE — Progress Notes (Signed)
Patient ID: Don Jacobs, male    DOB: [birth year], 78 y.o.   MRN: [id number] HPI 03/12/11- 48 yoM seen for Dr [name] because of hypoxia and sleep apnea.   Diagnosed obstructive sleep apnea with NPSG 8//2/88 RDI/ AHI  30.6/hr. Last here in 2006. He had done well on CPAP 8.5/ [facility], but had gained weight and was waking tired recently. Had back surgery January 26, 2011. After surgery his oxygen saturation fell despite CPAP and he was given supplemental O2.  He quit smoking 30 years ago and denies hx of lung disease. Has an incidental cold this week, but denies routine cough or wheeze. Remote broken nose, no ENT surgery, Doesn't think he snores though his mask.   Atrial Fib dx;d January, 2012- now on coumadin. Treated hypothyroidism.    04/14/11-  37 yoM former smoker seen for Dr [name] because of hypoxia and sleep apnea.   Continues CPAP all night every night as he has for years and can't sleep without it. He thinks the pressure change up to 10 ([facility])  has helped.   ONOX on RA/CPAP 03/29/11 recorded 12 minutes with sat <88%, which I reviewed with him. He felt better after the increase to 10. We agreed that rather than adding an O2 concentrator, we would see how he does with one more pressure increase.,  >> CPAP 12  09/25/13- 22 yoM former smoker seen for Dr [name] because of hypoxia and sleep apnea, complicated by AFib, HBP, hypothyroid FOLLOWS FOR: Sudden onset of snoring even with CPAP 10 machine-went to [facility] for new mask; they checked machine and sent report to [place].  Download/[facility]-good compliance and control on 10 CWP. He reports starting to snore and feel tired so we discussed increase to 12  12.29/15- 78 yoM former smoker seen for Dr [name] because of hypoxia and sleep apnea, complicated by AFib, HBP, hypothyroid    wife here FOLLOWS FOR: wears cpap 8-11 hours daily.  No pressure with supplies.  needs new mask.  interested in seeing if it's time for a new machine.    They report he  occasionally snores through his CPAP 12/[facility], but not often. Machine is getting old and we discussed replacement.  ROS-see HPI Constitutional:   No-   weight loss, night sweats, fevers, chills, +fatigue, lassitude. HEENT:   No-  headaches, difficulty swallowing, tooth/dental problems, sore throat,       No-  sneezing, itching, ear ache, nasal congestion, post nasal drip,  CV:  No-   chest pain, orthopnea, PND, swelling in lower extremities, anasarca, dizziness, palpitations Resp: No-   shortness of breath with exertion or at rest.              No-   productive cough,  No non-productive cough,  No- coughing up of blood.              No-   change in color of mucus.  No- wheezing.   Skin: No-   rash or lesions. GI:  No-   heartburn, indigestion, abdominal pain, nausea, vomiting,  GU:  MS:  No-   joint pain or swelling.   Neuro-     nothing unusual Psych:  No- change in mood or affect. No depression or anxiety.  No memory loss.  OBJ- Physical Exam General- Alert, Oriented, Affect-appropriate, Distress- none acute, + overweight Skin- rash-none, lesions- none, excoriation- none Lymphadenopathy- none Head- atraumatic            Eyes- Gross vision intact, PERRLA,  conjunctivae and secretions clear            Ears- + Hearing aid            Nose- Clear, no-Septal dev, mucus, polyps, erosion, perforation             Throat- Mallampati IV , mucosa clear , drainage- none, tonsils- atrophic. dentures Neck- flexible , trachea midline, no stridor , thyroid nl, carotid no bruit Chest - symmetrical excursion , unlabored           Heart/CV- RRR/ paced, no murmur , no gallop  , no rub, nl s1 s2                           - JVD- none , edema- none, stasis changes- none, varices- none           Lung- clear to P&A, wheeze- none, cough- none , dullness-none, rub- none           Chest wall-  L pacemaker Abd-  Br/ Gen/ Rectal- Not done, not indicated Extrem- cyanosis- none, clubbing, none, atrophy- none,  strength- nl Neuro- grossly intact to observation

## 2014-09-29 NOTE — Assessment & Plan Note (Signed)
Compliance is been good. Pressure is probably good. It sounds as if he needs a new mask and he may need a new machine. We discussed how to deal with this through his homecare company.

## 2014-10-11 ENCOUNTER — Ambulatory Visit (INDEPENDENT_AMBULATORY_CARE_PROVIDER_SITE_OTHER): Payer: Medicare Other | Admitting: *Deleted

## 2014-10-11 DIAGNOSIS — I481 Persistent atrial fibrillation: Secondary | ICD-10-CM

## 2014-10-11 DIAGNOSIS — R001 Bradycardia, unspecified: Secondary | ICD-10-CM | POA: Diagnosis not present

## 2014-10-11 DIAGNOSIS — I4819 Other persistent atrial fibrillation: Secondary | ICD-10-CM

## 2014-10-11 LAB — MDC_IDC_ENUM_SESS_TYPE_REMOTE
Battery Remaining Longevity: 75 mo
Battery Voltage: 3 V
Brady Statistic AP VP Percent: 0.14 %
Brady Statistic AP VS Percent: 0.02 %
Brady Statistic AS VP Percent: 21.4 %
Brady Statistic AS VS Percent: 78.44 %
Brady Statistic RA Percent Paced: 0.16 %
Lead Channel Impedance Value: 342 Ohm
Lead Channel Pacing Threshold Amplitude: 0.625 V
Lead Channel Pacing Threshold Pulse Width: 0.4 ms
Lead Channel Pacing Threshold Pulse Width: 0.4 ms
Lead Channel Sensing Intrinsic Amplitude: 1 mV
Lead Channel Sensing Intrinsic Amplitude: 1 mV
Lead Channel Sensing Intrinsic Amplitude: 11 mV
Lead Channel Sensing Intrinsic Amplitude: 11 mV
Lead Channel Setting Pacing Pulse Width: 0.4 ms
MDC IDC MSMT LEADCHNL RA IMPEDANCE VALUE: 418 Ohm
MDC IDC MSMT LEADCHNL RA PACING THRESHOLD AMPLITUDE: 0.5 V
MDC IDC MSMT LEADCHNL RV IMPEDANCE VALUE: 456 Ohm
MDC IDC MSMT LEADCHNL RV IMPEDANCE VALUE: 513 Ohm
MDC IDC SESS DTM: 20160114133736
MDC IDC SET LEADCHNL RA PACING AMPLITUDE: 2 V
MDC IDC SET LEADCHNL RV PACING AMPLITUDE: 2.5 V
MDC IDC SET LEADCHNL RV SENSING SENSITIVITY: 4 mV
MDC IDC SET ZONE DETECTION INTERVAL: 350 ms
MDC IDC SET ZONE DETECTION INTERVAL: 400 ms
MDC IDC STAT BRADY RV PERCENT PACED: 21.54 %

## 2014-10-11 NOTE — Progress Notes (Signed)
Remote pacemaker transmission.   

## 2014-11-26 DIAGNOSIS — M2240 Chondromalacia patellae, unspecified knee: Secondary | ICD-10-CM | POA: Diagnosis not present

## 2014-11-26 DIAGNOSIS — M1710 Unilateral primary osteoarthritis, unspecified knee: Secondary | ICD-10-CM | POA: Diagnosis not present

## 2014-11-26 DIAGNOSIS — Z681 Body mass index (BMI) 19 or less, adult: Secondary | ICD-10-CM | POA: Diagnosis not present

## 2014-12-04 ENCOUNTER — Telehealth: Payer: Self-pay | Admitting: Interventional Cardiology

## 2014-12-04 NOTE — Telephone Encounter (Signed)
New Message       Pt's wife calling stating that pt needs to be seen sooner. Notified pt's wife that Dr. Irish Lack doesn't have any available appts prior to pt's already scheduled appt. There are also no sooner PA appts. Please call back and advise.

## 2014-12-04 NOTE — Telephone Encounter (Signed)
Patient complaining of SOB, tiredness, bloating, and lower extremity swelling for 1 1/2 week. Patient thinks he is in persistent A. Fib. BP 131/83 and HR 75. Patient has an appointment on 12/20/14 with Dr. Irish Lack, but patient thinks he needs to be seen sooner. Consulted Dr. Burt Knack, patient can be seen by Tarri Fuller PA ([facility]) for tomorrow. Patient scheduled and aware of appointment.

## 2014-12-05 ENCOUNTER — Encounter: Payer: Self-pay | Admitting: Physician Assistant

## 2014-12-05 ENCOUNTER — Ambulatory Visit (INDEPENDENT_AMBULATORY_CARE_PROVIDER_SITE_OTHER): Payer: Medicare Other | Admitting: Physician Assistant

## 2014-12-05 VITALS — BP 106/72 | HR 87 | Ht 69.0 in | Wt 230.0 lb

## 2014-12-05 DIAGNOSIS — I447 Left bundle-branch block, unspecified: Secondary | ICD-10-CM | POA: Diagnosis not present

## 2014-12-05 DIAGNOSIS — R0602 Shortness of breath: Secondary | ICD-10-CM

## 2014-12-05 DIAGNOSIS — I10 Essential (primary) hypertension: Secondary | ICD-10-CM

## 2014-12-05 DIAGNOSIS — R0600 Dyspnea, unspecified: Secondary | ICD-10-CM | POA: Insufficient documentation

## 2014-12-05 DIAGNOSIS — I481 Persistent atrial fibrillation: Secondary | ICD-10-CM

## 2014-12-05 DIAGNOSIS — E669 Obesity, unspecified: Secondary | ICD-10-CM

## 2014-12-05 DIAGNOSIS — I4819 Other persistent atrial fibrillation: Secondary | ICD-10-CM

## 2014-12-05 NOTE — Assessment & Plan Note (Addendum)
Weight has been stable.  He is trying to exercise.

## 2014-12-05 NOTE — Assessment & Plan Note (Signed)
Rate is well controlled. Change in therapy.

## 2014-12-05 NOTE — Assessment & Plan Note (Signed)
Patient's been reporting dyspnea and generalized weakness for about a week and half. He does not appear anemic on exam.  He is still in A. fib his rate is well controlled no overt signs of heart failure. He does have a new left bundle branch block on his EKG. We'll schedule echocardiogram and Lexiscan Myoview.

## 2014-12-05 NOTE — Patient Instructions (Signed)
Your physician has requested that you have an echocardiogram. Echocardiography is a painless test that uses sound waves to create images of your heart. It provides your doctor with information about the size and shape of your heart and how well your heart's chambers and valves are working. This procedure takes approximately one hour. There are no restrictions for this procedure.   Your physician has requested that you have a lexiscan myoview. For further information please visit [email or web address]. Please follow instruction sheet, as given.  Your physician recommends that you follow-up as scheduled with Dr. Irish Lack.

## 2014-12-05 NOTE — Progress Notes (Signed)
Patient ID: Don Jacobs, male   DOB: 11-Sep-1936, 79 y.o.   MRN: [id number]    Date:  12/05/2014   ID:  Don Jacobs, DOB 1935-12-19, MRN [id number]  PCP:  Henrine Screws, MD  Primary Cardiologist:  Irish Lack   No chief complaint on file.    History of Present Illness: Don Jacobs is a 79 y.o. male  who has had PAF and was on Coumadin. He had a fall and an ICH affecting the right temporal lobe in 2014. He was admitted to [facility]. While there, he had significant bradycardia with HR dropping to as low as 29 bpm. He eventually had a pacemaker placed. Although it was thought to be safe to go back on anticoagulation from his neurosurgeon, he did not want to take this medicine.   He is now on Eliquis.   He began having more atrial fibrillation. His heart rate was in the 130s by monitor. Medications were adjusted. He ultimately saw Dr. Caryl Comes. Rythmol was started for rhythm control. The patient underwent cardioversion but normal sinus rhythm did not last for more than a day.  Since that time, we has been rate controlling his atrial fibrillation.  Tikosyn and ablation have been discussed in the past.   He presents today complaining of generalized weakness for the last 1 to 1-1/2 weeks. He exercises a few times a week by walking in a swimming pool and does not have any shortness of breath release times. However, he does report shortness of breath when walking up the hill from his shop and also getting a little lightheadedness. He does not get any chest pressure or tightness. His weight has been stable. He takes when necessary Lasix for swelling in his legs. He also drinks about 70-80 ounces of water daily because he feels dehydrated. Does not. Occasionally has night sweats. Denies nausea, vomiting, fever, orthopnea, PND, abdominal pain,  PND, cough, congestion, abdominal pain, hematochezia, melena, claudication.  Wt Readings from Last 3 Encounters:  12/05/14 230 lb (104.327 kg)  09/25/14  228 lb (103.42 kg)  07/18/14 213 lb (96.616 kg)     Past Medical History  Diagnosis Date  . Hypertension   . A-fib   . Sleep apnea   . Gout   . Hypothyroidism   . Allergic rhinitis   . ED (erectile dysfunction)   . BPH (benign prostatic hyperplasia)   . Hearing loss     uses hearing a cyst  . Hx of migraines   . Prostatitis, chronic   . Spondylolisthesis 07/2010  . Seborrheic keratosis   . Aortic aneurysm, thoracic 05/25/2011  . Pacemaker     Current Outpatient Prescriptions  Medication Sig Dispense Refill  . allopurinol (ZYLOPRIM) 300 MG tablet Take 300 mg by mouth at bedtime.     Marland Kitchen apixaban (ELIQUIS) 5 MG TABS tablet Take 1 tablet (5 mg total) by mouth 2 (two) times daily. 180 tablet 3  . Calcium Carbonate (CALTRATE 600 PO) Take 600 mg by mouth at bedtime.     . cholecalciferol (VITAMIN D) 1000 UNITS tablet Take 1,000 Units by mouth at bedtime.    . colchicine 0.6 MG tablet Take 0.6 mg by mouth daily as needed (gout).     . cyanocobalamin 500 MCG tablet Take 500 mcg by mouth daily.    Marland Kitchen diltiazem (CARDIZEM CD) 180 MG 24 hr capsule Take 1 capsule (180 mg total) by mouth daily. 90 capsule 1  . doxazosin (CARDURA) 8 MG tablet Take 8  mg by mouth at bedtime.      . furosemide (LASIX) 40 MG tablet Take 40 mg by mouth daily as needed (swelling).    Marland Kitchen latanoprost (XALATAN) 0.005 % ophthalmic solution Place 1 drop into both eyes at bedtime.    Marland Kitchen levothyroxine (SYNTHROID, LEVOTHROID) 112 MCG tablet Take 112 mcg by mouth every evening.     . loratadine (CLARITIN) 10 MG tablet Take 10 mg by mouth daily.     . predniSONE (DELTASONE) 10 MG tablet Take 10 mg by mouth daily as needed (gout flare up).     . vitamin C (ASCORBIC ACID) 500 MG tablet Take 500 mg by mouth daily.      No current facility-administered medications for this visit.    Allergies:    Allergies  Allergen Reactions  . Penicillins Shortness Of Breath and Rash    Ended up at ER after using  . Oxycodone     Altered  mental status    Social History:  The patient  reports that he quit smoking about 33 years ago. His smoking use included Cigarettes. He has a 12 pack-year smoking history. He has never used smokeless tobacco. He reports that he does not drink alcohol or use illicit drugs.   Family history:   Family History  Problem Relation Age of Onset  . Heart disease Father     ROS:  Please see the history of present illness.  All other systems reviewed and negative.   PHYSICAL EXAM: VS:  BP 106/72 mmHg  Pulse 87  Ht 5\' 9"  (1.753 m)  Wt 230 lb (104.327 kg)  BMI 33.95 kg/m2 Well nourished, well developed, in no acute distress HEENT: Pupils are equal round react to light accommodation extraocular movements are intact. Conjunctiva are well perfused. Neck: no JVDNo cervical lymphadenopathy. Cardiac: Irregular rate and rhythm without murmurs rubs or gallops. Lungs:  clear to auscultation bilaterally, no wheezing, rhonchi or rales Abd: soft, nontender, positive bowel sounds all quadrants, no hepatosplenomegaly Ext: 1+ lower extremity edema.  2+ radial and dorsalis pedis pulses. Capillary refill good Skin: warm and dry Neuro:  Grossly normal  EKG:   Atrial fibrillation with a rate of 77 bpm. Left bundle branch block. ASSESSMENT AND PLAN:  Problem List Items Addressed This Visit    Obesity    Weight has been stable.  He is trying to exercise.      Hypertension    Blood pressure is well-controlled.      Dyspnea    Patient's been reporting dyspnea and generalized weakness for about a week and half. He does not appear anemic on exam.  He is still in A. fib his rate is well controlled no overt signs of heart failure. He does have a new left bundle branch block on his EKG. We'll schedule echocardiogram and Lexiscan Myoview.        A-fib - Primary    Rate is well controlled. Change in therapy.      Relevant Orders   EKG 12-Lead    Other Visit Diagnoses    SOB (shortness of breath)         Relevant Orders    2D Echocardiogram without contrast    Myocardial Perfusion Imaging    LBBB (left bundle branch block)        Relevant Orders    2D Echocardiogram without contrast    Myocardial Perfusion Imaging

## 2014-12-05 NOTE — Assessment & Plan Note (Signed)
Blood pressure is well controlled 

## 2014-12-13 ENCOUNTER — Encounter: Payer: Self-pay | Admitting: Cardiology

## 2014-12-17 ENCOUNTER — Ambulatory Visit (HOSPITAL_COMMUNITY): Payer: Medicare Other | Attending: Physician Assistant | Admitting: Radiology

## 2014-12-17 ENCOUNTER — Ambulatory Visit (HOSPITAL_BASED_OUTPATIENT_CLINIC_OR_DEPARTMENT_OTHER): Payer: Medicare Other | Admitting: Radiology

## 2014-12-17 DIAGNOSIS — I447 Left bundle-branch block, unspecified: Secondary | ICD-10-CM

## 2014-12-17 DIAGNOSIS — R0602 Shortness of breath: Secondary | ICD-10-CM

## 2014-12-17 MED ORDER — TECHNETIUM TC 99M SESTAMIBI GENERIC - CARDIOLITE
10.0000 | Freq: Once | INTRAVENOUS | Status: AC | PRN
  Administered 2014-12-17: 10 via INTRAVENOUS

## 2014-12-17 MED ORDER — TECHNETIUM TC 99M SESTAMIBI GENERIC - CARDIOLITE
30.0000 | Freq: Once | INTRAVENOUS | Status: AC | PRN
  Administered 2014-12-17: 30 via INTRAVENOUS

## 2014-12-17 MED ORDER — REGADENOSON 0.4 MG/5ML IV SOLN
0.4000 mg | Freq: Once | INTRAVENOUS | Status: AC
  Administered 2014-12-17: 0.4 mg via INTRAVENOUS

## 2014-12-17 NOTE — Progress Notes (Signed)
[name] [address] [place], [place] [address] [phone]    Cardiology Nuclear Med Study  Don Jacobs is a 79 y.o. male     MRN : [id number]     DOB: May 22, 1936  Procedure Date: 12/17/2014  Nuclear Med Background Indication for Stress Test:  Evaluation for Ischemia History:  PTVP, AFIB, LBBB Cardiac Risk Factors: n/a  Symptoms:  DOE, Light-Headedness and Palpitations   Nuclear Pre-Procedure Caffeine/Decaff Intake:  None NPO After: 9:00pm   Lungs:  clear O2 Sat: 95% on room air. IV 0.9% NS with Angio Cath:  22g  IV Site: R Antecubital  IV Started by:  Perrin Maltese, EMT-P  Chest Size (in):  46 Cup Size: n/a  Height: 5\' 9"  (1.753 m)  Weight:  228 lb (103.42 kg)  BMI:  Body mass index is 33.65 kg/(m^2). Tech Comments:  Rx this am    Nuclear Med Study 1 or 2 day study: 1 day  Stress Test Type:  Lexiscan  Reading MD: n/a  Order Authorizing Provider:  Hager/Varanasi MD  Resting Radionuclide: Technetium 15m Sestamibi  Resting Radionuclide Dose: 11.0 mCi   Stress Radionuclide:  Technetium 2m Sestamibi  Stress Radionuclide Dose: 33.0 mCi           Stress Protocol Rest HR: 81 Stress HR: 117  Rest BP: 130/82 Stress BP: 124/70  Exercise Time (min): n/a METS: n/a   Predicted Max HR: 144 bpm % Max HR: 81.25 bpm Rate Pressure Product: 15210   Dose of Adenosine (mg):  n/a Dose of Lexiscan: 0.4 mg  Dose of Atropine (mg): n/a Dose of Dobutamine: n/a mcg/kg/min (at max HR)  Stress Test Technologist: Perrin Maltese, EMT-P  Nuclear Technologist:  Earl Many, CNMT     Rest Procedure:  Myocardial perfusion imaging was performed at rest 45 minutes following the intravenous administration of Technetium 10m Sestamibi. Rest ECG: Atrial Fibrilliation, LBBB  Stress Procedure:  The patient received IV Lexiscan 0.4 mg over 15-seconds.  Technetium 40m Sestamibi injected at 30-seconds. This patient was sob and dizzy with the Lexiscan  injection.  Quantitative spect images were obtained after a 45 minute delay. Stress ECG: No significant change from baseline ECG  QPS Raw Data Images:  Normal; no motion artifact; normal heart/lung ratio. Stress Images:  Normal homogeneous uptake in all areas of the myocardium. Rest Images:  Normal homogeneous uptake in all areas of the myocardium. Subtraction (SDS):  No evidence of ischemia. Transient Ischemic Dilatation (Normal <1.22):  1.06 Lung/Heart Ratio (Normal <0.45):  0.36  Quantitative Gated Spect Images QGS EDV:  NA QGS ESV:  NA  Impression Exercise Capacity:  Lexiscan with no exercise. BP Response:  Normal blood pressure response. Clinical Symptoms:  No chest pain. ECG Impression:  Baseline:  LBBB.  EKG uninterpretable due to LBBB at rest and stress. Comparison with Prior Nuclear Study: No images to compare  Overall Impression:  Low risk stress nuclear study.   Study was nongated because of the atrial fibrillation.  No ischemia by perfusion.  LV Ejection Fraction: Study not gated.  LV Wall Motion:  NA  Darlin Coco MD

## 2014-12-17 NOTE — Progress Notes (Signed)
Echocardiogram performed.  

## 2014-12-20 ENCOUNTER — Encounter: Payer: Self-pay | Admitting: Interventional Cardiology

## 2014-12-20 ENCOUNTER — Ambulatory Visit (INDEPENDENT_AMBULATORY_CARE_PROVIDER_SITE_OTHER): Payer: Medicare Other | Admitting: Interventional Cardiology

## 2014-12-20 VITALS — BP 122/68 | HR 80 | Ht 69.0 in | Wt 233.0 lb

## 2014-12-20 DIAGNOSIS — I10 Essential (primary) hypertension: Secondary | ICD-10-CM

## 2014-12-20 DIAGNOSIS — I4819 Other persistent atrial fibrillation: Secondary | ICD-10-CM

## 2014-12-20 DIAGNOSIS — I481 Persistent atrial fibrillation: Secondary | ICD-10-CM | POA: Diagnosis not present

## 2014-12-20 DIAGNOSIS — I447 Left bundle-branch block, unspecified: Secondary | ICD-10-CM | POA: Diagnosis not present

## 2014-12-20 NOTE — Progress Notes (Signed)
Patient ID: Don Jacobs, male   DOB: [birth year], 79 y.o.   MRN: [id number]     Cardiology Office Note   Date:  12/20/2014   ID:  Don Jacobs, DOB August 18, 1936, MRN [id number]  PCP:  Henrine Screws, MD  Cardiologist:   Jettie Booze., MD   No chief complaint on file. AFib    History of Present Illness: Don Jacobs is a 79 y.o. male who presents for follow-up of his atrial fibrillation and conduction disease. His most recent pacemaker check showed that he has essentially and permanent atrial fibrillation. He has not had any high heart rates that he knows of or that he has felt. He occasionally has shortness of breath. In the last few days, he has had a cold and some issues with allergies.  He continues to complain of weakness. He mentioned this couple of weeks ago when he saw our PA. He had a stress test which was negative for ischemia. He feels that when he walks in the water doing water aerobics, he does not feel weak. When he tries to do regular walking, his legs are weak. He denies any chest pain.    Past Medical History  Diagnosis Date  . Hypertension   . A-fib   . Sleep apnea   . Gout   . Hypothyroidism   . Allergic rhinitis   . ED (erectile dysfunction)   . BPH (benign prostatic hyperplasia)   . Hearing loss     uses hearing a cyst  . Hx of migraines   . Prostatitis, chronic   . Spondylolisthesis 07/2010  . Seborrheic keratosis   . Aortic aneurysm, thoracic 05/25/2011  . Pacemaker     Past Surgical History  Procedure Laterality Date  . Back surgery    . L5 selective nerve root block      Dr Nelva Bush  . Foot surgery Right ~ 2007    "reconstruction" (02/17/2013)  . Shoulder open rotator cuff repair Right 09/2007; 2013  . Repair peroneal tendons ankle Right 10/2003  . Knee arthroscopy Left ~ 2007  . Anterior cervical decomp/discectomy fusion  10/2000    "C4, 5, 6 w/fusion" (02/17/2013)  . Lumbar spine surgery  2012    "bone graft, Dr. [name]"  (02/17/2013)  . Insert / replace / remove pacemaker  02/17/2013  . Cardiac catheterization  2000's    "once" (02/17/2013)  . Cardioversion N/A 04/09/2014    Procedure: CARDIOVERSION;  Surgeon: Lelon Perla, MD;  Location: [facility] ENDOSCOPY;  Service: Cardiovascular;  Laterality: N/A;  . Permanent pacemaker insertion N/A 02/17/2013    Procedure: PERMANENT PACEMAKER INSERTION;  Surgeon: Deboraha Sprang, MD;  Location: [facility] CATH LAB;  Service: Cardiovascular;  Laterality: N/A;     Current Outpatient Prescriptions  Medication Sig Dispense Refill  . allopurinol (ZYLOPRIM) 300 MG tablet Take 300 mg by mouth at bedtime.     Marland Kitchen apixaban (ELIQUIS) 5 MG TABS tablet Take 1 tablet (5 mg total) by mouth 2 (two) times daily. 180 tablet 3  . Calcium Carbonate (CALTRATE 600 PO) Take 600 mg by mouth at bedtime.     . cholecalciferol (VITAMIN D) 1000 UNITS tablet Take 1,000 Units by mouth at bedtime.    . colchicine 0.6 MG tablet Take 0.6 mg by mouth daily as needed (gout).     . cyanocobalamin 500 MCG tablet Take 500 mcg by mouth daily.    Marland Kitchen diltiazem (CARDIZEM CD) 180 MG 24 hr capsule Take 1 capsule (180 mg  total) by mouth daily. 90 capsule 1  . doxazosin (CARDURA) 8 MG tablet Take 8 mg by mouth at bedtime.      . furosemide (LASIX) 40 MG tablet Take 40 mg by mouth daily as needed (swelling).    Marland Kitchen latanoprost (XALATAN) 0.005 % ophthalmic solution Place 1 drop into both eyes at bedtime.    Marland Kitchen levothyroxine (SYNTHROID, LEVOTHROID) 112 MCG tablet Take 112 mcg by mouth every evening.     . loratadine (CLARITIN) 10 MG tablet Take 10 mg by mouth daily.     . predniSONE (DELTASONE) 10 MG tablet Take 10 mg by mouth daily as needed (gout flare up).     . vitamin C (ASCORBIC ACID) 500 MG tablet Take 500 mg by mouth daily.      No current facility-administered medications for this visit.    Allergies:   Penicillins and Oxycodone    Social History:  The patient  reports that he quit smoking about 33 years ago. His  smoking use included Cigarettes. He has a 12 pack-year smoking history. He has never used smokeless tobacco. He reports that he does not drink alcohol or use illicit drugs.   Family History:  The patient's family history includes Heart disease in his father.    ROS:  Please see the history of present illness.   Otherwise, review of systems are positive for leg weakness.   All other systems are reviewed and negative.    PHYSICAL EXAM: VS:  BP 122/68 mmHg  Pulse 80  Ht 5\' 9"  (1.753 m)  Wt 233 lb (105.688 kg)  BMI 34.39 kg/m2 , BMI Body mass index is 34.39 kg/(m^2). GEN: Well nourished, well developed, in no acute distress HEENT: normal Neck: no JVD, carotid bruits, or masses Cardiac: irregularly irregular; no murmurs, rubs, or gallops,no edema  Respiratory:  clear to auscultation bilaterally, normal work of breathing GI: soft, nontender, nondistended, + BS MS: no deformity or atrophy Skin: warm and dry, no rash Neuro:  Strength and sensation are intact Psych: euthymic mood, full affect   EKG:  EKG is not ordered today. The ekg ordered today demonstrates    Recent Labs: 04/05/2014: Hemoglobin 14.2; Platelets 138.0*; Potassium 3.6; Sodium 139 07/06/2014: Creatinine 1.10 07/13/2014: BUN 17    Lipid Panel No results found for: CHOL, TRIG, HDL, CHOLHDL, VLDL, LDLCALC, LDLDIRECT    Wt Readings from Last 3 Encounters:  12/20/14 233 lb (105.688 kg)  12/17/14 228 lb (103.42 kg)  12/05/14 230 lb (104.327 kg)      Other studies Reviewed: Additional studies/ records that were reviewed today include: stress test. Review of the above records demonstrates: no ischemia   ASSESSMENT AND PLAN:  1.  Atrial fibrillation: Although the frequency of atrial fibrillation has increased, he is not having a lot of palpitations. At this point, I would continue a strategy of rate control and anticoagulation. We had discussed antiarrhythmic therapy but at this point, given his lack of symptoms from  the A. fib, we'll plan on rate control. Continue pacemaker checks. We did review his nuclear stress test results which were normal. He has a new left bundle branch block on EKG as well. We went over this.  Leg weakness.  Exam showed normal strength today bilaterally. He had one work several months ago at the [place]. His primary care doctor as this. Would consider thyroid test and vitamin D level test. He would like to see what the last results were before having this repeated.   Current  medicines are reviewed at length with the patient today.  The patient does not have concerns regarding medicines.  The following changes have been made:  no change  Labs/ tests ordered today include: none  No orders of the defined types were placed in this encounter.     Disposition:   FU in 6 months   Teresita Madura., MD  12/20/2014 2:39 PM    [facility] [place], [place], [place]  [address] Phone: [phone]; Fax: [phone]

## 2014-12-20 NOTE — Patient Instructions (Signed)
Your physician recommends that you continue on your current medications as directed. Please refer to the Current Medication list given to you today.   Your physician wants you to follow-up in: 1 year follow-up with Dr. Varanasi. You will receive a reminder letter in the mail two months in advance. If you don't receive a letter, please call our office to schedule the follow-up appointment.  

## 2014-12-26 ENCOUNTER — Other Ambulatory Visit: Payer: Self-pay | Admitting: Internal Medicine

## 2014-12-26 DIAGNOSIS — J069 Acute upper respiratory infection, unspecified: Secondary | ICD-10-CM | POA: Diagnosis not present

## 2014-12-26 DIAGNOSIS — R059 Cough, unspecified: Secondary | ICD-10-CM

## 2014-12-26 DIAGNOSIS — R05 Cough: Secondary | ICD-10-CM

## 2014-12-26 DIAGNOSIS — J209 Acute bronchitis, unspecified: Secondary | ICD-10-CM | POA: Diagnosis not present

## 2014-12-27 ENCOUNTER — Encounter: Payer: Self-pay | Admitting: Internal Medicine

## 2014-12-31 ENCOUNTER — Other Ambulatory Visit: Payer: Medicare Other

## 2015-01-03 ENCOUNTER — Inpatient Hospital Stay (HOSPITAL_COMMUNITY)
Admission: EM | Admit: 2015-01-03 | Discharge: 2015-01-08 | DRG: 292 | Disposition: A | Discharge status: Home / Self Care | Payer: Medicare Other | Attending: Interventional Cardiology | Admitting: Interventional Cardiology

## 2015-01-03 ENCOUNTER — Encounter (HOSPITAL_COMMUNITY): Payer: Self-pay | Admitting: Emergency Medicine

## 2015-01-03 ENCOUNTER — Emergency Department (HOSPITAL_COMMUNITY): Payer: Medicare Other

## 2015-01-03 ENCOUNTER — Telehealth: Payer: Self-pay | Admitting: Physician Assistant

## 2015-01-03 DIAGNOSIS — I5023 Acute on chronic systolic (congestive) heart failure: Secondary | ICD-10-CM | POA: Diagnosis not present

## 2015-01-03 DIAGNOSIS — I482 Chronic atrial fibrillation, unspecified: Secondary | ICD-10-CM | POA: Diagnosis present

## 2015-01-03 DIAGNOSIS — I429 Cardiomyopathy, unspecified: Secondary | ICD-10-CM | POA: Diagnosis present

## 2015-01-03 DIAGNOSIS — N411 Chronic prostatitis: Secondary | ICD-10-CM | POA: Diagnosis present

## 2015-01-03 DIAGNOSIS — E669 Obesity, unspecified: Secondary | ICD-10-CM | POA: Diagnosis present

## 2015-01-03 DIAGNOSIS — I5022 Chronic systolic (congestive) heart failure: Secondary | ICD-10-CM | POA: Diagnosis present

## 2015-01-03 DIAGNOSIS — N189 Chronic kidney disease, unspecified: Secondary | ICD-10-CM | POA: Diagnosis present

## 2015-01-03 DIAGNOSIS — J069 Acute upper respiratory infection, unspecified: Secondary | ICD-10-CM | POA: Diagnosis present

## 2015-01-03 DIAGNOSIS — J309 Allergic rhinitis, unspecified: Secondary | ICD-10-CM | POA: Diagnosis present

## 2015-01-03 DIAGNOSIS — I5021 Acute systolic (congestive) heart failure: Secondary | ICD-10-CM

## 2015-01-03 DIAGNOSIS — I129 Hypertensive chronic kidney disease with stage 1 through stage 4 chronic kidney disease, or unspecified chronic kidney disease: Secondary | ICD-10-CM | POA: Diagnosis present

## 2015-01-03 DIAGNOSIS — N400 Enlarged prostate without lower urinary tract symptoms: Secondary | ICD-10-CM | POA: Diagnosis present

## 2015-01-03 DIAGNOSIS — Z7901 Long term (current) use of anticoagulants: Secondary | ICD-10-CM

## 2015-01-03 DIAGNOSIS — I10 Essential (primary) hypertension: Secondary | ICD-10-CM | POA: Diagnosis present

## 2015-01-03 DIAGNOSIS — I447 Left bundle-branch block, unspecified: Secondary | ICD-10-CM | POA: Diagnosis not present

## 2015-01-03 DIAGNOSIS — H9190 Unspecified hearing loss, unspecified ear: Secondary | ICD-10-CM | POA: Diagnosis present

## 2015-01-03 DIAGNOSIS — E039 Hypothyroidism, unspecified: Secondary | ICD-10-CM | POA: Diagnosis present

## 2015-01-03 DIAGNOSIS — I509 Heart failure, unspecified: Secondary | ICD-10-CM | POA: Diagnosis not present

## 2015-01-03 DIAGNOSIS — G43909 Migraine, unspecified, not intractable, without status migrainosus: Secondary | ICD-10-CM | POA: Diagnosis present

## 2015-01-03 DIAGNOSIS — Z950 Presence of cardiac pacemaker: Secondary | ICD-10-CM

## 2015-01-03 DIAGNOSIS — M109 Gout, unspecified: Secondary | ICD-10-CM | POA: Diagnosis present

## 2015-01-03 DIAGNOSIS — R52 Pain, unspecified: Secondary | ICD-10-CM | POA: Diagnosis not present

## 2015-01-03 DIAGNOSIS — G4733 Obstructive sleep apnea (adult) (pediatric): Secondary | ICD-10-CM | POA: Diagnosis present

## 2015-01-03 DIAGNOSIS — R0602 Shortness of breath: Secondary | ICD-10-CM | POA: Diagnosis not present

## 2015-01-03 DIAGNOSIS — M1712 Unilateral primary osteoarthritis, left knee: Secondary | ICD-10-CM | POA: Diagnosis present

## 2015-01-03 DIAGNOSIS — I4891 Unspecified atrial fibrillation: Secondary | ICD-10-CM

## 2015-01-03 DIAGNOSIS — Z6835 Body mass index (BMI) 35.0-35.9, adult: Secondary | ICD-10-CM

## 2015-01-03 DIAGNOSIS — Z880 Allergy status to penicillin: Secondary | ICD-10-CM

## 2015-01-03 DIAGNOSIS — Z87891 Personal history of nicotine dependence: Secondary | ICD-10-CM

## 2015-01-03 DIAGNOSIS — M4310 Spondylolisthesis, site unspecified: Secondary | ICD-10-CM | POA: Diagnosis present

## 2015-01-03 DIAGNOSIS — R001 Bradycardia, unspecified: Secondary | ICD-10-CM | POA: Diagnosis present

## 2015-01-03 HISTORY — DX: Chronic systolic (congestive) heart failure: I50.22

## 2015-01-03 LAB — BASIC METABOLIC PANEL
ANION GAP: 15 (ref 5–15)
BUN: 19 mg/dL (ref 6–23)
CHLORIDE: 98 mmol/L (ref 96–112)
CO2: 28 mmol/L (ref 19–32)
Calcium: 8.6 mg/dL (ref 8.4–10.5)
Creatinine, Ser: 1.46 mg/dL — ABNORMAL HIGH (ref 0.50–1.35)
GFR calc Af Amer: 52 mL/min — ABNORMAL LOW (ref >90)
GFR calc non Af Amer: 45 mL/min — ABNORMAL LOW (ref >90)
Glucose, Bld: 96 mg/dL (ref 70–99)
Potassium: 3.9 mmol/L (ref 3.5–5.1)
Sodium: 141 mmol/L (ref 135–145)

## 2015-01-03 LAB — HEPATIC FUNCTION PANEL
ALK PHOS: 46 U/L (ref 39–117)
ALT: 17 U/L (ref 0–53)
AST: 17 U/L (ref 0–37)
Albumin: 3.2 g/dL — ABNORMAL LOW (ref 3.5–5.2)
BILIRUBIN DIRECT: 0.3 mg/dL (ref 0.0–0.5)
Indirect Bilirubin: 1.1 mg/dL — ABNORMAL HIGH (ref 0.3–0.9)
Total Bilirubin: 1.4 mg/dL — ABNORMAL HIGH (ref 0.3–1.2)
Total Protein: 5.8 g/dL — ABNORMAL LOW (ref 6.0–8.3)

## 2015-01-03 LAB — CBC
HCT: 41.9 % (ref 39.0–52.0)
Hemoglobin: 13.9 g/dL (ref 13.0–17.0)
MCH: 31 pg (ref 26.0–34.0)
MCHC: 33.2 g/dL (ref 30.0–36.0)
MCV: 93.5 fL (ref 78.0–100.0)
PLATELETS: 138 10*3/uL — AB (ref 150–400)
RBC: 4.48 MIL/uL (ref 4.22–5.81)
RDW: 14.8 % (ref 11.5–15.5)
WBC: 8.6 10*3/uL (ref 4.0–10.5)

## 2015-01-03 LAB — I-STAT TROPONIN, ED: Troponin i, poc: 0 ng/mL (ref 0.00–0.08)

## 2015-01-03 LAB — BRAIN NATRIURETIC PEPTIDE: B Natriuretic Peptide: 228 pg/mL — ABNORMAL HIGH (ref 0.0–100.0)

## 2015-01-03 MED ORDER — DILTIAZEM HCL 25 MG/5ML IV SOLN
20.0000 mg | Freq: Once | INTRAVENOUS | Status: AC
Start: 2015-01-03 — End: 2015-01-03
  Administered 2015-01-03: 20 mg via INTRAVENOUS
  Filled 2015-01-03: qty 5

## 2015-01-03 MED ORDER — DILTIAZEM HCL 100 MG IV SOLR
5.0000 mg/h | INTRAVENOUS | Status: DC
  Administered 2015-01-03: 5 mg/h via INTRAVENOUS
  Administered 2015-01-04 (×3): 15 mg/h via INTRAVENOUS
  Filled 2015-01-03 (×5): qty 100

## 2015-01-03 NOTE — ED Notes (Signed)
Family at bedside. 

## 2015-01-03 NOTE — Telephone Encounter (Signed)
Mrs. Buonocore called because Don Jacobs has been complaining of abdominal bloating, increasing shortness of breath and ongoing dry cough ever since he was treated for an upper respiratory infection with steroids and cough medicine.  His cough has been persistent, and he has had increasing orthopnea and possibly PND over the last few days. He is complaining of abdominal bloating. His wife notes that his blood pressure has been okay on his home blood pressure machine but his heart rate has been elevated at times and she thinks his atrial fibrillation is going faster than usual. He has not weighed himself recently and doesn't know how much weight he has gained but his current weight is 5 pounds over the weight on his last his office visit.  He has not taken his Lasix 40 mg daily when necessary in the last couple of days because his legs have not been swelling.  Advised her that if he needs evaluation, the only way to do it is to bring him to the emergency room. She feels that he does need to be evaluated because neither one of them has slept for the past 2 nights and she doesn't think she can go through that again. She states she will bring him to the [facility] emergency room.  Rosaria Ferries, PA-C 01/03/2015 7:23 PM [facility] [phone]

## 2015-01-03 NOTE — H&P (Signed)
Patient ID: Don Jacobs MRN: [id number], DOB/AGE: 12-08-1935   Admit date: 01/03/2015   Primary Physician: Don Screws, MD Primary Cardiologist: Don Booze, MD  Pt. Profile:  18M HTN, persistent AF on apixaban, hypothyroidism, bradycardia s/p PPM, newly diagnosed LBBB (12/05/14) who presents with new onset HF and AF with RVR.   Problem List  Past Medical History  Diagnosis Date  . Hypertension   . A-fib   . Sleep apnea   . Gout   . Hypothyroidism   . Allergic rhinitis   . ED (erectile dysfunction)   . BPH (benign prostatic hyperplasia)   . Hearing loss     uses hearing a cyst  . Hx of migraines   . Prostatitis, chronic   . Spondylolisthesis 07/2010  . Seborrheic keratosis   . Aortic aneurysm, thoracic 05/25/2011  . Pacemaker     Past Surgical History  Procedure Laterality Date  . Back surgery    . L5 selective nerve root block      Dr Nelva Bush  . Foot surgery Right ~ 2007    "reconstruction" (02/17/2013)  . Shoulder open rotator cuff repair Right 09/2007; 2013  . Repair peroneal tendons ankle Right 10/2003  . Knee arthroscopy Left ~ 2007  . Anterior cervical decomp/discectomy fusion  10/2000    "C4, 5, 6 w/fusion" (02/17/2013)  . Lumbar spine surgery  2012    "bone graft, Dr. [name]" (02/17/2013)  . Insert / replace / remove pacemaker  02/17/2013  . Cardiac catheterization  2000's    "once" (02/17/2013)  . Cardioversion N/A 04/09/2014    Procedure: CARDIOVERSION;  Surgeon: Lelon Perla, MD;  Location: [facility] ENDOSCOPY;  Service: Cardiovascular;  Laterality: N/A;  . Permanent pacemaker insertion N/A 02/17/2013    Procedure: PERMANENT PACEMAKER INSERTION;  Surgeon: Deboraha Sprang, MD;  Location: [facility] CATH LAB;  Service: Cardiovascular;  Laterality: N/A;     Allergies  Allergies  Allergen Reactions  . Penicillins Shortness Of Breath and Rash    Ended up at ER after using  . Oxycodone Other (See Comments)    Altered mental status    HPI  18M  HTN, persistent AF on apixaban, hypothyroidism, bradycardia s/p PPM, newly diagnosed LBBB (12/05/14) who presents with new onset HF and AF with RVR.   Don Jacobs was in his USOH until 8 days ago when he developed a mainly dry cough. He saw a PCP who prescribed steroids and codeine for presumptive respiratory infection. He did not like how it made him feel and he stopped the pred 4 days ago and stopped the cough suppressant 3 days ago. Around that time, he noted progressive abdominal distention and "pressure." 2 days ago, he developed PND, orthopnea, and a 5 pound weight gain. He reported sleeping parts of the last 2 nights in a chair. The day PTA, he noticed LE edema and experienced some dizzy spells. Wife noted tachycardia to the day of admission and called for advice. She was instructed to have Don Jacobs come to the ER for evaluation. Of note, he has not taken his Lasix 40 mg daily over the the last couple of days because his legs have not been swelling. He does not regularly check weights.   On arrival to the Er, Don Jacobs was in AF with RVR (158bpm), BP 109/71, 995 on RA, afebrile. ECG confirmed AF with RVR and known LBBB. Labs were notable for K 3.9, Cr 1.46 (baseline 1.1-1.2), BNP 228, POC TnI 0.00. CXR demonstrated  Cardiomegaly and mild pulmonary vascular congestion. He was started on a diltiazem drip.   Of note, Don Jacobs underwent a low risk nuclear study on 12/17/14. A TTE on the same day demonstrated EF 40-45%. The EF was decreased from 55% to 60% on 01/2014.    Home Medications  Prior to Admission medications   Medication Sig Start Date End Date Taking? Authorizing Provider  allopurinol (ZYLOPRIM) 300 MG tablet Take 300 mg by mouth at bedtime.    Yes Historical Provider, MD  apixaban (ELIQUIS) 5 MG TABS tablet Take 1 tablet (5 mg total) by mouth 2 (two) times daily. 01/25/14  Yes Don Booze, MD  Calcium Carbonate (CALTRATE 600 PO) Take 600 mg by mouth at bedtime.    Yes Historical  Provider, MD  cholecalciferol (VITAMIN D) 1000 UNITS tablet Take 1,000 Units by mouth at bedtime.   Yes Historical Provider, MD  cyanocobalamin 500 MCG tablet Take 500 mcg by mouth daily.   Yes Historical Provider, MD  dextromethorphan (DELSYM) 30 MG/5ML liquid Take 15 mg by mouth as needed for cough.   Yes Historical Provider, MD  diltiazem (CARDIZEM CD) 180 MG 24 hr capsule Take 1 capsule (180 mg total) by mouth daily. 07/23/14  Yes Don Booze, MD  doxazosin (CARDURA) 8 MG tablet Take 8 mg by mouth at bedtime.     Yes Historical Provider, MD  furosemide (LASIX) 40 MG tablet Take 40 mg by mouth daily as needed (swelling).   Yes Historical Provider, MD  guaiFENesin (ROBITUSSIN) 100 MG/5ML SOLN Take 10 mLs by mouth every 4 (four) hours as needed for cough or to loosen phlegm.   Yes Historical Provider, MD  guaiFENesin-codeine (ROBITUSSIN AC) 100-10 MG/5ML syrup Take 5 mLs by mouth 3 (three) times daily as needed for cough.   Yes Historical Provider, MD  latanoprost (XALATAN) 0.005 % ophthalmic solution Place 1 drop into both eyes at bedtime.   Yes Historical Provider, MD  levothyroxine (SYNTHROID, LEVOTHROID) 112 MCG tablet Take 112 mcg by mouth daily before breakfast.    Yes Historical Provider, MD  loratadine (CLARITIN) 10 MG tablet Take 5 mg by mouth 2 (two) times daily as needed for allergies or rhinitis.    Yes Historical Provider, MD  Misc Natural Products (SAW PALMETTO) CAPS Take 1 capsule by mouth daily.   Yes Historical Provider, MD  OVER THE COUNTER MEDICATION Take 1 tablet by mouth daily. muscudine seed   Yes Historical Provider, MD  predniSONE (DELTASONE) 10 MG tablet Take 10 mg by mouth daily as needed (gout flare up).    Yes Historical Provider, MD  predniSONE (STERAPRED UNI-PAK) 10 MG tablet Take 10-60 tablets by mouth daily.   Yes Historical Provider, MD  vitamin C (ASCORBIC ACID) 500 MG tablet Take 500 mg by mouth daily.    Yes Historical Provider, MD  colchicine 0.6 MG  tablet Take 0.6 mg by mouth daily as needed (gout).     Historical Provider, MD    Family History  Family History  Problem Relation Age of Onset  . Heart disease Father     Social History  History   Social History  . Marital Status: Married    Spouse Name: N/A  . Number of Children: 2  . Years of Education: N/A   Occupational History  . retired    Social History Main Topics  . Smoking status: Former Smoker -- 1.00 packs/day for 12 years    Types: Cigarettes    Quit date: 03/11/1981  .  Smokeless tobacco: Never Used  . Alcohol Use: No  . Drug Use: No  . Sexual Activity: Not on file   Other Topics Concern  . Not on file   Social History Narrative     Review of Systems General:  No chills, fever, night sweats or weight changes.  Cardiovascular:  No chest pain or palpitations. + dyspnea on exertion, edema, orthopnea,  paroxysmal nocturnal dyspnea. Dermatological: No rash, lesions/masses Respiratory: + cough, dyspnea Urologic: No hematuria, dysuria Abdominal:   No nausea, vomiting, diarrhea, bright red blood per rectum, melena, or hematemesis Neurologic:  No visual changes, wkns, changes in mental status. All other systems reviewed and are otherwise negative except as noted above.  Physical Exam  Blood pressure 109/71, pulse 54, temperature 98.1 F (36.7 C), temperature source Oral, resp. rate 22, height 5\' 9"  (1.753 m), weight 108.41 kg (239 lb), SpO2 93 %.  General: Pleasant, NAD, occasional dry coughing Psych: Normal affect. Neuro: Alert and oriented X 3. Moves all extremities spontaneously. HEENT: Normal  Neck: Supple without bruits. JVP to mid neck while seated.  Lungs:  Resp regular and unlabored, fine bibasilar crackles.  Heart: Irregularly irregular, tachycardic, no murmurs. Abdomen: Soft, non-tender,obese, BS + x 4.  Extremities: No clubbing, cyanosis. 1-2+ LE edema. DP/PT/Radials 2+ and equal bilaterally.  Labs  Troponin [facility] of Care  Test)  Recent Labs  01/03/15 2104  TROPIPOC 0.00   No results for input(s): CKTOTAL, CKMB, TROPONINI in the last 72 hours. Lab Results  Component Value Date   WBC 8.6 01/03/2015   HGB 13.9 01/03/2015   HCT 41.9 01/03/2015   MCV 93.5 01/03/2015   PLT 138* 01/03/2015    Recent Labs Lab 01/03/15 2100 01/03/15 2103  NA 141  --   K 3.9  --   CL 98  --   CO2 28  --   BUN 19  --   CREATININE 1.46*  --   CALCIUM 8.6  --   PROT  --  5.8*  BILITOT  --  1.4*  ALKPHOS  --  46  ALT  --  17  AST  --  17  GLUCOSE 96  --    No results found for: CHOL, HDL, LDLCALC, TRIG No results found for: DDIMER   Radiology/Studies  No results found.   Lexiscan 12/17/14 Low risk stress nuclear study.   Study was nongated because of the atrial fibrillation.  No ischemia by perfusion.  TTE 12/17/14 - Left ventricle: The cavity size was moderately dilated. There was   mild concentric hypertrophy. Systolic function was mildly to   moderately reduced. The estimated ejection fraction was in the   range of 40% to 45%. The EF may be underestimated due to   prominent paradoxical septal motion from pacer and irregular   rhythm Diffuse hypokinesis. There is akinesis of the basal   inferoseptal and basal inferior myocardium. - Ventricular septum: Septal motion showed moderate paradox. These   changes are consistent with right ventricular pacing. - Aorta: Aortic root dimension: 40 mm (ED). Ascending aortic   diameter: 46 mm (S). - Ascending aorta: The ascending aorta was moderately dilated. - Mitral valve: There was mild regurgitation. - Left atrium: The atrium was severely dilated. - Pulmonic valve: There was trivial regurgitation.  ECG  01/03/15 @ 8:24: ECG demonstrated AF with RVR and LBBB. Compared to 12/05/14, AF is now faster.   ASSESSMENT AND PLAN  69M HTN, persistent AF on apixaban, hypothyroidism, bradycardia s/p PPM, newly diagnosed  LBBB (12/05/14) who presents with new onset HF and AF with  RVR. Mr. Brauer has a recent diagnosis of LBBB and LV systolic dysfunction.   Acute systolic heart failure. Based on normal stress test, this is by definition a non-ischemic myopathy. Since his newly diagnosed low EF occurred prior to this degree of cardiomyopathy, a tachy myopathy is unlikely to be the only cause of the myopathy, although AF is likely an important driver of the exacerbation.  It is possible that dyssyncrhony from the LBBB is an important factor. Based on chart review (complaints of dysnea reported on 3/9 visit) suspect this has been a more subacute process that Mr. Hanrahan is letting on.   1. Lasix 40mg  IV BID 2. Will start a beta blocker given LV systolic dysfunction and AF with RVR 3. May ultimately require CRT if myopathy progresses 4. Consider starting ACEi  AF with RVR. CHADSVASC = 4 (CHF, HTN, Age x 2). Unclear of AF cause HF exacerbation or vice versa. He has not tolerated attempts at rhythm control (intolerance to amio and flecainide per patient) and other approaches (i.e. Ablation) have not been pushed due to historically not having symptoms in AF.   1. Continue apixaban 2. Continue dilt drip 3. Continue oral diltiazem (home med) in fractionated form: 60mg  q8 4. Will start - and uptitrate - metoprolol given AF with RVR and recent diagnosis of LV systolic dysfunction 5. K>4, Mg>2, will give 36meq K 6. TSH  Signed, Lamar Sprinkles, MD 01/03/2015, 11:24 PM

## 2015-01-03 NOTE — ED Provider Notes (Signed)
CSN: [id number]     Arrival date & time 01/03/15  2018 History   First MD Initiated Contact with Patient 01/03/15 2035     Chief Complaint  Patient presents with  . Shortness of Breath     (Consider location/radiation/quality/duration/timing/severity/associated sxs/prior Treatment) HPI  This is a 79 yo male, with PMH HTN, a fib, with pacemaker in place, presenting today with dyspnea, cough, fatigue.  Pt has had non-productive cough for about two weeks now, was prescribed prednisone for this by PCP.  For three days now, Don Jacobs has suffered from SOB, fatigue, palpitations, at home, consistent, described as "feeling bad."  Pt took his PRN lasix two days ago with no improvement.  Negative for CP, fever, rash, or joint swelling.  Positive for orthopnea.  Positive for worsening LE edema.    Past Medical History  Diagnosis Date  . Hypertension   . A-fib   . Sleep apnea   . Gout   . Hypothyroidism   . Allergic rhinitis   . ED (erectile dysfunction)   . BPH (benign prostatic hyperplasia)   . Hearing loss     uses hearing a cyst  . Hx of migraines   . Prostatitis, chronic   . Spondylolisthesis 07/2010  . Seborrheic keratosis   . Aortic aneurysm, thoracic 05/25/2011  . Pacemaker    Past Surgical History  Procedure Laterality Date  . Back surgery    . L5 selective nerve root block      Dr Nelva Bush  . Foot surgery Right ~ 2007    "reconstruction" (02/17/2013)  . Shoulder open rotator cuff repair Right 09/2007; 2013  . Repair peroneal tendons ankle Right 10/2003  . Knee arthroscopy Left ~ 2007  . Anterior cervical decomp/discectomy fusion  10/2000    "C4, 5, 6 w/fusion" (02/17/2013)  . Lumbar spine surgery  2012    "bone graft, Dr. [name]" (02/17/2013)  . Insert / replace / remove pacemaker  02/17/2013  . Cardiac catheterization  2000's    "once" (02/17/2013)  . Cardioversion N/A 04/09/2014    Procedure: CARDIOVERSION;  Surgeon: Lelon Perla, MD;  Location: [facility] ENDOSCOPY;  Service:  Cardiovascular;  Laterality: N/A;  . Permanent pacemaker insertion N/A 02/17/2013    Procedure: PERMANENT PACEMAKER INSERTION;  Surgeon: Deboraha Sprang, MD;  Location: [facility] CATH LAB;  Service: Cardiovascular;  Laterality: N/A;   Family History  Problem Relation Age of Onset  . Heart disease Father    History  Substance Use Topics  . Smoking status: Former Smoker -- 1.00 packs/day for 12 years    Types: Cigarettes    Quit date: 03/11/1981  . Smokeless tobacco: Never Used  . Alcohol Use: No    Review of Systems  Constitutional: Negative for fever and chills.  HENT: Negative for facial swelling.   Eyes: Negative for pain and visual disturbance.  Respiratory: Positive for cough and shortness of breath. Negative for chest tightness.   Cardiovascular: Negative for chest pain.  Gastrointestinal: Negative for nausea and vomiting.  Genitourinary: Negative for dysuria.  Musculoskeletal: Negative for myalgias and arthralgias.  Neurological: Negative for headaches.  Psychiatric/Behavioral: Negative for behavioral problems.      Allergies  Penicillins and Oxycodone  Home Medications   Prior to Admission medications   Medication Sig Start Date End Date Taking? Authorizing Provider  allopurinol (ZYLOPRIM) 300 MG tablet Take 300 mg by mouth at bedtime.     Historical Provider, MD  apixaban (ELIQUIS) 5 MG TABS tablet Take 1 tablet (  5 mg total) by mouth 2 (two) times daily. 01/25/14   Jettie Booze, MD  Calcium Carbonate (CALTRATE 600 PO) Take 600 mg by mouth at bedtime.     Historical Provider, MD  cholecalciferol (VITAMIN D) 1000 UNITS tablet Take 1,000 Units by mouth at bedtime.    Historical Provider, MD  colchicine 0.6 MG tablet Take 0.6 mg by mouth daily as needed (gout).     Historical Provider, MD  cyanocobalamin 500 MCG tablet Take 500 mcg by mouth daily.    Historical Provider, MD  diltiazem (CARDIZEM CD) 180 MG 24 hr capsule Take 1 capsule (180 mg total) by mouth daily.  07/23/14   Jettie Booze, MD  doxazosin (CARDURA) 8 MG tablet Take 8 mg by mouth at bedtime.      Historical Provider, MD  furosemide (LASIX) 40 MG tablet Take 40 mg by mouth daily as needed (swelling).    Historical Provider, MD  latanoprost (XALATAN) 0.005 % ophthalmic solution Place 1 drop into both eyes at bedtime.    Historical Provider, MD  levothyroxine (SYNTHROID, LEVOTHROID) 112 MCG tablet Take 112 mcg by mouth every evening.     Historical Provider, MD  loratadine (CLARITIN) 10 MG tablet Take 10 mg by mouth daily.     Historical Provider, MD  predniSONE (DELTASONE) 10 MG tablet Take 10 mg by mouth daily as needed (gout flare up).     Historical Provider, MD  vitamin C (ASCORBIC ACID) 500 MG tablet Take 500 mg by mouth daily.     Historical Provider, MD   BP 123/99 mmHg  Pulse 59  Temp(Src) 98.1 F (36.7 C) (Oral)  Resp 28  Ht 5\' 9"  (1.753 m)  Wt 239 lb (108.41 kg)  BMI 35.28 kg/m2  SpO2 98% Physical Exam  Constitutional: He is oriented to person, place, and time. He appears well-developed and well-nourished. No distress.  HENT:  Head: Normocephalic and atraumatic.  Mouth/Throat: No oropharyngeal exudate.  Eyes: Conjunctivae are normal. Pupils are equal, round, and reactive to light. No scleral icterus.  Neck: Normal range of motion. No tracheal deviation present. No thyromegaly present.  Cardiovascular: Normal rate, regular rhythm and normal heart sounds.  Exam reveals no gallop and no friction rub.   No murmur heard. Pulmonary/Chest: Effort normal. No stridor. No respiratory distress. He has no wheezes. He has rales (right base). He exhibits no tenderness.  Abdominal: Soft. He exhibits no distension and no mass. There is no tenderness. There is no rebound and no guarding.  Musculoskeletal: Normal range of motion. He exhibits edema (BLEs).  Neurological: He is alert and oriented to person, place, and time.  Skin: Skin is warm and dry. He is not diaphoretic.    ED  Course  Procedures (including critical care time) Labs Review Labs Reviewed  CBC - Abnormal; Notable for the following:    Platelets 138 (*)    All other components within normal limits  BASIC METABOLIC PANEL - Abnormal; Notable for the following:    Creatinine, Ser 1.46 (*)    GFR calc non Af Amer 45 (*)    GFR calc Af Amer 52 (*)    All other components within normal limits  BRAIN NATRIURETIC PEPTIDE - Abnormal; Notable for the following:    B Natriuretic Peptide 228.0 (*)    All other components within normal limits  HEPATIC FUNCTION PANEL - Abnormal; Notable for the following:    Total Protein 5.8 (*)    Albumin 3.2 (*)  Total Bilirubin 1.4 (*)    Indirect Bilirubin 1.1 (*)    All other components within normal limits  I-STAT TROPOININ, ED    Imaging Review No results found.   EKG Interpretation   Date/Time:  Thursday January 03 2015 20:24:44 EDT Ventricular Rate:  153 PR Interval:    QRS Duration: 132 QT Interval:  320 QTC Calculation: 510 R Axis:   -47 Text Interpretation:  Atrial fibrillation with rapid ventricular response  Left axis deviation Non-specific intra-ventricular conduction block  Possible Lateral infarct , age undetermined Abnormal ECG Confirmed by  DELOS  MD, DOUGLAS ([id number]) on 01/03/2015 8:47:51 PM      MDM   Final diagnoses:  None    This is a 79 yo male, with PMH HTN, a fib, with pacemaker in place, presenting today with dyspnea, cough, fatigue.  Pt has had non-productive cough for about two weeks now, was prescribed prednisone for this by PCP.  For three days now, Don Jacobs has suffered from SOB, fatigue, palpitations, at home, consistent, described as "feeling bad."  Pt took his PRN lasix two days ago with no improvement.  Negative for CP, fever, rash, or joint swelling.  Positive for orthopnea.  Positive for worsening LE edema.    On exam, pt is tachycardic to the 150s.  BP is stable.  Mental status is good.  Positive for LE edema, JVD,  rales on exam.  I believe that pt is suffering from CHF exacerbation due to a fib RVR.  Awaiting CXR, BNP, CBC, CMP.  Will diurese, attempt to rate control, likely admit upon reevaluation.  After 20 mg of diltiazem IV bolus, followed by dilt drip, HR is now fluctuating between lower 90s to lower 100s.  BP remains stable, as does mental status.  Rate has been titrated up to 10.  BNP is elevated to 228.  Creatinine is slightly elevated from baseline.  Troponin is undetectable.  Pt still is without CP or SOB at this time.  Will admit to the hospital for further care.    Pt is being admitted to cards, on tele.    I have discussed case and care has been guided by my attending physician, Delo.    Doy Hutching, MD 01/04/15 [id number]  Veryl Speak, MD 01/05/15 [phone]

## 2015-01-03 NOTE — ED Notes (Signed)
MD at bedside. 

## 2015-01-03 NOTE — ED Notes (Signed)
Pt presents with shortness of breath onset last night that has gotten worse throughout the day- admits to dry cough X 3 weeks.  Pt noted to be in a-fib RVR upon arrival to triage room- denies pain at present.  Pt does admit to feeling weak for the past 2 days as well.

## 2015-01-04 ENCOUNTER — Encounter (HOSPITAL_COMMUNITY): Payer: Self-pay | Admitting: *Deleted

## 2015-01-04 DIAGNOSIS — I5023 Acute on chronic systolic (congestive) heart failure: Secondary | ICD-10-CM | POA: Diagnosis not present

## 2015-01-04 DIAGNOSIS — I429 Cardiomyopathy, unspecified: Secondary | ICD-10-CM

## 2015-01-04 DIAGNOSIS — G43909 Migraine, unspecified, not intractable, without status migrainosus: Secondary | ICD-10-CM | POA: Diagnosis present

## 2015-01-04 DIAGNOSIS — N189 Chronic kidney disease, unspecified: Secondary | ICD-10-CM | POA: Diagnosis present

## 2015-01-04 DIAGNOSIS — E669 Obesity, unspecified: Secondary | ICD-10-CM | POA: Diagnosis present

## 2015-01-04 DIAGNOSIS — M1712 Unilateral primary osteoarthritis, left knee: Secondary | ICD-10-CM | POA: Diagnosis present

## 2015-01-04 DIAGNOSIS — R05 Cough: Secondary | ICD-10-CM | POA: Diagnosis not present

## 2015-01-04 DIAGNOSIS — N411 Chronic prostatitis: Secondary | ICD-10-CM | POA: Diagnosis present

## 2015-01-04 DIAGNOSIS — E039 Hypothyroidism, unspecified: Secondary | ICD-10-CM | POA: Diagnosis present

## 2015-01-04 DIAGNOSIS — Z880 Allergy status to penicillin: Secondary | ICD-10-CM | POA: Diagnosis not present

## 2015-01-04 DIAGNOSIS — H9190 Unspecified hearing loss, unspecified ear: Secondary | ICD-10-CM | POA: Diagnosis present

## 2015-01-04 DIAGNOSIS — I129 Hypertensive chronic kidney disease with stage 1 through stage 4 chronic kidney disease, or unspecified chronic kidney disease: Secondary | ICD-10-CM | POA: Diagnosis not present

## 2015-01-04 DIAGNOSIS — J309 Allergic rhinitis, unspecified: Secondary | ICD-10-CM | POA: Diagnosis present

## 2015-01-04 DIAGNOSIS — M4310 Spondylolisthesis, site unspecified: Secondary | ICD-10-CM | POA: Diagnosis present

## 2015-01-04 DIAGNOSIS — Z87891 Personal history of nicotine dependence: Secondary | ICD-10-CM | POA: Diagnosis not present

## 2015-01-04 DIAGNOSIS — G4733 Obstructive sleep apnea (adult) (pediatric): Secondary | ICD-10-CM | POA: Diagnosis present

## 2015-01-04 DIAGNOSIS — Z7901 Long term (current) use of anticoagulants: Secondary | ICD-10-CM | POA: Diagnosis not present

## 2015-01-04 DIAGNOSIS — M25462 Effusion, left knee: Secondary | ICD-10-CM | POA: Diagnosis not present

## 2015-01-04 DIAGNOSIS — M109 Gout, unspecified: Secondary | ICD-10-CM | POA: Diagnosis present

## 2015-01-04 DIAGNOSIS — I447 Left bundle-branch block, unspecified: Secondary | ICD-10-CM | POA: Diagnosis present

## 2015-01-04 DIAGNOSIS — Z950 Presence of cardiac pacemaker: Secondary | ICD-10-CM | POA: Diagnosis not present

## 2015-01-04 DIAGNOSIS — Z6835 Body mass index (BMI) 35.0-35.9, adult: Secondary | ICD-10-CM | POA: Diagnosis not present

## 2015-01-04 DIAGNOSIS — R0602 Shortness of breath: Secondary | ICD-10-CM | POA: Diagnosis not present

## 2015-01-04 DIAGNOSIS — I481 Persistent atrial fibrillation: Secondary | ICD-10-CM | POA: Diagnosis not present

## 2015-01-04 DIAGNOSIS — J069 Acute upper respiratory infection, unspecified: Secondary | ICD-10-CM | POA: Diagnosis present

## 2015-01-04 DIAGNOSIS — I482 Chronic atrial fibrillation: Secondary | ICD-10-CM | POA: Diagnosis not present

## 2015-01-04 DIAGNOSIS — N400 Enlarged prostate without lower urinary tract symptoms: Secondary | ICD-10-CM | POA: Diagnosis present

## 2015-01-04 LAB — TSH: TSH: 4.57 u[IU]/mL — AB (ref 0.350–4.500)

## 2015-01-04 LAB — MAGNESIUM: MAGNESIUM: 1.9 mg/dL (ref 1.5–2.5)

## 2015-01-04 MED ORDER — VITAMIN C 500 MG PO TABS
500.0000 mg | ORAL_TABLET | Freq: Every day | ORAL | Status: DC
  Administered 2015-01-04 – 2015-01-08 (×5): 500 mg via ORAL
  Filled 2015-01-04 (×5): qty 1

## 2015-01-04 MED ORDER — CALCIUM CARBONATE 1250 (500 CA) MG PO TABS
1.0000 | ORAL_TABLET | Freq: Every day | ORAL | Status: DC
  Administered 2015-01-04 – 2015-01-07 (×5): 500 mg via ORAL
  Filled 2015-01-04 (×6): qty 1

## 2015-01-04 MED ORDER — DILTIAZEM HCL 60 MG PO TABS
60.0000 mg | ORAL_TABLET | Freq: Three times a day (TID) | ORAL | Status: DC
  Administered 2015-01-04 – 2015-01-06 (×4): 60 mg via ORAL
  Filled 2015-01-04 (×12): qty 1

## 2015-01-04 MED ORDER — DOXAZOSIN MESYLATE 8 MG PO TABS
8.0000 mg | ORAL_TABLET | Freq: Every day | ORAL | Status: DC
  Administered 2015-01-04 – 2015-01-07 (×5): 8 mg via ORAL
  Filled 2015-01-04 (×6): qty 1

## 2015-01-04 MED ORDER — ACETAMINOPHEN 325 MG PO TABS
650.0000 mg | ORAL_TABLET | ORAL | Status: DC | PRN
  Administered 2015-01-04 – 2015-01-07 (×3): 650 mg via ORAL
  Filled 2015-01-04 (×3): qty 2

## 2015-01-04 MED ORDER — SODIUM CHLORIDE 0.9 % IV SOLN: 250.0000 mL | INTRAVENOUS | Status: DC | PRN

## 2015-01-04 MED ORDER — CYANOCOBALAMIN 500 MCG PO TABS
500.0000 ug | ORAL_TABLET | Freq: Every day | ORAL | Status: DC
  Administered 2015-01-04 – 2015-01-08 (×5): 500 ug via ORAL
  Filled 2015-01-04 (×5): qty 1

## 2015-01-04 MED ORDER — MORPHINE SULFATE 2 MG/ML IJ SOLN
2.0000 mg | Freq: Once | INTRAMUSCULAR | Status: AC
  Administered 2015-01-04: 2 mg via INTRAVENOUS
  Filled 2015-01-04: qty 1

## 2015-01-04 MED ORDER — ONDANSETRON HCL 4 MG/2ML IJ SOLN: 4.0000 mg | Freq: Four times a day (QID) | INTRAMUSCULAR | Status: DC | PRN

## 2015-01-04 MED ORDER — FUROSEMIDE 10 MG/ML IJ SOLN
40.0000 mg | Freq: Once | INTRAMUSCULAR | Status: AC
  Administered 2015-01-04: 40 mg via INTRAVENOUS
  Filled 2015-01-04: qty 4

## 2015-01-04 MED ORDER — FUROSEMIDE 10 MG/ML IJ SOLN
40.0000 mg | Freq: Two times a day (BID) | INTRAMUSCULAR | Status: DC
  Administered 2015-01-04 – 2015-01-06 (×5): 40 mg via INTRAVENOUS
  Filled 2015-01-04 (×6): qty 4

## 2015-01-04 MED ORDER — SODIUM CHLORIDE 0.9 % IJ SOLN: 3.0000 mL | INTRAMUSCULAR | Status: DC | PRN

## 2015-01-04 MED ORDER — POTASSIUM CHLORIDE CRYS ER 20 MEQ PO TBCR
30.0000 meq | EXTENDED_RELEASE_TABLET | Freq: Once | ORAL | Status: AC
  Administered 2015-01-04: 30 meq via ORAL
  Filled 2015-01-04: qty 1

## 2015-01-04 MED ORDER — LEVOTHYROXINE SODIUM 112 MCG PO TABS
112.0000 ug | ORAL_TABLET | Freq: Every day | ORAL | Status: DC
  Administered 2015-01-04 – 2015-01-07 (×4): 112 ug via ORAL
  Filled 2015-01-04 (×6): qty 1

## 2015-01-04 MED ORDER — METOPROLOL TARTRATE 1 MG/ML IV SOLN
5.0000 mg | Freq: Once | INTRAVENOUS | Status: AC
  Administered 2015-01-04: 5 mg via INTRAVENOUS
  Filled 2015-01-04: qty 5

## 2015-01-04 MED ORDER — VITAMIN D3 25 MCG (1000 UNIT) PO TABS
1000.0000 [IU] | ORAL_TABLET | Freq: Every day | ORAL | Status: DC
  Administered 2015-01-04 – 2015-01-07 (×5): 1000 [IU] via ORAL
  Filled 2015-01-04 (×6): qty 1

## 2015-01-04 MED ORDER — LATANOPROST 0.005 % OP SOLN
1.0000 [drp] | Freq: Every day | OPHTHALMIC | Status: DC
  Administered 2015-01-04 – 2015-01-07 (×5): 1 [drp] via OPHTHALMIC
  Filled 2015-01-04: qty 2.5

## 2015-01-04 MED ORDER — LORATADINE 10 MG PO TABS
5.0000 mg | ORAL_TABLET | Freq: Two times a day (BID) | ORAL | Status: DC | PRN
  Filled 2015-01-04: qty 0.5

## 2015-01-04 MED ORDER — ALLOPURINOL 300 MG PO TABS
300.0000 mg | ORAL_TABLET | Freq: Every day | ORAL | Status: DC
  Administered 2015-01-04 – 2015-01-07 (×5): 300 mg via ORAL
  Filled 2015-01-04 (×6): qty 1

## 2015-01-04 MED ORDER — SODIUM CHLORIDE 0.9 % IJ SOLN
3.0000 mL | Freq: Two times a day (BID) | INTRAMUSCULAR | Status: DC
  Administered 2015-01-04 – 2015-01-08 (×9): 3 mL via INTRAVENOUS

## 2015-01-04 MED ORDER — METOPROLOL TARTRATE 12.5 MG HALF TABLET
12.5000 mg | ORAL_TABLET | Freq: Two times a day (BID) | ORAL | Status: DC
  Administered 2015-01-04 – 2015-01-07 (×8): 12.5 mg via ORAL
  Filled 2015-01-04 (×9): qty 1

## 2015-01-04 MED ORDER — APIXABAN 5 MG PO TABS
5.0000 mg | ORAL_TABLET | Freq: Two times a day (BID) | ORAL | Status: DC
  Administered 2015-01-04 – 2015-01-08 (×10): 5 mg via ORAL
  Filled 2015-01-04 (×11): qty 1

## 2015-01-04 NOTE — ED Notes (Signed)
Family at bedside. 

## 2015-01-04 NOTE — Progress Notes (Signed)
Notified Brittainy Rosita Fire that patients heart rate has been in the 80's and 90's while on iv cardizem at 15mg /hour. Had a po dose of cardizem due. Previous shift held po cardizem. Asked if she wanted me to hold med, and PA stated to hold.

## 2015-01-04 NOTE — Progress Notes (Signed)
Notified on-call doctor of patient stating when he lays down he's not able to breathe and can not get comfortable in bed. Orders given to administer morphine. Nurse will carry out order and continue to monitor patient to end of shift.

## 2015-01-04 NOTE — Progress Notes (Signed)
Patient Profile: 77M HTN, persistent AF on apixaban, hypothyroidism, bradycardia s/p PPM, newly diagnosed LBBB (12/05/14) who presents with new onset HF and AF with RVR.   Note: EF 40-45% on echo 12/17/14 (down from 55-60% 02/08/14). Also recent echo showed diffuse hypokinesis. There is akinesis of the basal inferoseptal and basal inferior myocardium. New compared to prior echo in 2015. Subsequent f/u NST showed no ischemia.    Subjective: Feels better. Breathing improved.   Objective: Vital signs in last 24 hours: Temp:  [97.4 F (36.3 C)-98.1 F (36.7 C)] 98 F (36.7 C) (04/08 1008) Pulse Rate:  [31-158] 82 (04/08 1008) Resp:  [17-28] 18 (04/08 1008) BP: (103-128)/(43-99) 105/66 mmHg (04/08 1008) SpO2:  [93 %-99 %] 97 % (04/08 1008) Weight:  [231 lb 8 oz (105.008 kg)-239 lb (108.41 kg)] 231 lb 8 oz (105.008 kg) (04/08 0121) Last BM Date: 01/03/15  Intake/Output from previous day: 04/07 0701 - 04/08 0700 In: 243 [P.O.:240; I.V.:3] Out: 1355 [Urine:1355] Intake/Output this shift: Total I/O In: 600 [P.O.:600] Out: 200 [Urine:200]  Medications Current Facility-Administered Medications  Medication Dose Route Frequency Provider Last Rate Last Dose  . 0.9 %  sodium chloride infusion  250 mL Intravenous PRN Lamar Sprinkles, MD      . acetaminophen (TYLENOL) tablet 650 mg  650 mg Oral Q4H PRN Lamar Sprinkles, MD      . allopurinol (ZYLOPRIM) tablet 300 mg  300 mg Oral QHS Lamar Sprinkles, MD   300 mg at 01/04/15 0223  . apixaban (ELIQUIS) tablet 5 mg  5 mg Oral BID Lamar Sprinkles, MD   5 mg at 01/04/15 1014  . calcium carbonate (OS-CAL - dosed in mg of elemental calcium) tablet 500 mg of elemental calcium  1 tablet Oral QHS Lamar Sprinkles, MD   500 mg of elemental calcium at 01/04/15 0212  . cholecalciferol (VITAMIN D) tablet 1,000 Units  1,000 Units Oral QHS Lamar Sprinkles, MD   1,000 Units at 01/04/15 0213  . cyanocobalamin tablet 500 mcg  500 mcg Oral Daily Lamar Sprinkles, MD    500 mcg at 01/04/15 1014  . diltiazem (CARDIZEM) 100 mg in dextrose 5 % 100 mL (1 mg/mL) infusion  5-15 mg/hr Intravenous Titrated Doy Hutching, MD 15 mL/hr at 01/04/15 0548 15 mg/hr at 01/04/15 0548  . diltiazem (CARDIZEM) tablet 60 mg  60 mg Oral 3 times per day Lamar Sprinkles, MD   60 mg at 01/04/15 0233  . doxazosin (CARDURA) tablet 8 mg  8 mg Oral QHS Lamar Sprinkles, MD   8 mg at 01/04/15 3612  . furosemide (LASIX) injection 40 mg  40 mg Intravenous BID Lamar Sprinkles, MD   40 mg at 01/04/15 1014  . latanoprost (XALATAN) 0.005 % ophthalmic solution 1 drop  1 drop Both Eyes QHS Lamar Sprinkles, MD   1 drop at 01/04/15 0218  . levothyroxine (SYNTHROID, LEVOTHROID) tablet 112 mcg  112 mcg Oral QAC breakfast Lamar Sprinkles, MD   112 mcg at 01/04/15 0730  . loratadine (CLARITIN) tablet 5 mg  5 mg Oral BID PRN Lamar Sprinkles, MD      . metoprolol tartrate (LOPRESSOR) tablet 12.5 mg  12.5 mg Oral BID Lamar Sprinkles, MD   12.5 mg at 01/04/15 1013  . ondansetron (ZOFRAN) injection 4 mg  4 mg Intravenous Q6H PRN Lamar Sprinkles, MD      . sodium chloride 0.9 % injection 3 mL  3 mL Intravenous Q12H Lamar Sprinkles, MD   3 mL at 01/04/15 1000  .  sodium chloride 0.9 % injection 3 mL  3 mL Intravenous PRN Lamar Sprinkles, MD      . vitamin C (ASCORBIC ACID) tablet 500 mg  500 mg Oral Daily Lamar Sprinkles, MD   500 mg at 01/04/15 1013    PE: General appearance: alert, cooperative and no distress Neck: no carotid bruit and no JVD Lungs: clear to auscultation bilaterally Heart: irregularly irregular rhythm Extremities: 2+ LEE Pulses: 2+ and symmetric Skin: warm and dry Neurologic: Grossly normal  Lab Results:   Recent Labs  01/03/15 2100  WBC 8.6  HGB 13.9  HCT 41.9  PLT 138*   BMET  Recent Labs  01/03/15 2100  NA 141  K 3.9  CL 98  CO2 28  GLUCOSE 96  BUN 19  CREATININE 1.46*  CALCIUM 8.6   Cardiac Panel (last 3 results) No results for input(s): CKTOTAL, CKMB,  TROPONINI, RELINDX in the last 72 hours.   Assessment/Plan  Active Problems:   Acute on chronic systolic heart failure   1. Atrial Fibrillation w/ RVR: h/o persistent AF. Rate now controlled on IV Cardizem. Given LV dysfunction, may need to consider discontinuing PO Cardizem and increase BB for better rate control. Can also add digoxin if additional rate control is needed. Continue Eliquis for a/c.   2. Acute Systolic CHF: Diuresing well. - 1.3L in last 24 hrs. Breathing improved but still with significant bilateral LEE. Continue IV diuretic therapy. Strict I/Os, daily weights and low sodium diet. Monitor renal function.     LOS: 0 days    Rashawn Rolon M. Rosita Fire, PA-C 01/04/2015 12:03 PM

## 2015-01-04 NOTE — Care Management Note (Unsigned)
    Page 1 of 1   01/07/2015     4:54:56 PM CARE MANAGEMENT NOTE 01/07/2015  Patient:  Don Jacobs, Don Jacobs   Account Number:  [id number]  Date Initiated:  01/04/2015  Documentation initiated by:  Karstyn Birkey  Subjective/Objective Assessment:   Pt adm on 01/03/15 with CHF/SOB/Afib with RVR.  PTA, pt independent, lives with spouse.     Action/Plan:   Will follow for dc needs as pt progresses.   Anticipated DC Date:  01/08/2015   Anticipated DC Plan:  [place]  CM consult      Choice offered to / List presented to:             Status of service:  In process, will continue to follow Medicare Important Message given?  YES (If response is "NO", the following Medicare IM given date fields will be blank) Date Medicare IM given:  01/07/2015 Medicare IM given by:  Odes Lolli Date Additional Medicare IM given:   Additional Medicare IM given by:    Discharge Disposition:    Per UR Regulation:  Reviewed for med. necessity/level of care/duration of stay  If discussed at [place] of Stay Meetings, dates discussed:    Comments:

## 2015-01-04 NOTE — Progress Notes (Signed)
Nutrition Brief Note  Patient identified on the Malnutrition Screening Tool (MST) Report  Wt Readings from Last 15 Encounters:  01/04/15 231 lb 8 oz (105.008 kg)  12/20/14 233 lb (105.688 kg)  12/17/14 228 lb (103.42 kg)  12/05/14 230 lb (104.327 kg)  09/25/14 228 lb (103.42 kg)  07/18/14 213 lb (96.616 kg)  06/18/14 231 lb (104.781 kg)  04/10/14 231 lb (104.781 kg)  04/09/14 226 lb (102.513 kg)  04/05/14 232 lb (105.235 kg)  02/27/14 233 lb (105.688 kg)  02/13/14 235 lb (106.595 kg)  01/26/14 235 lb 12.8 oz (106.958 kg)  01/24/14 230 lb (104.327 kg)  10/13/13 239 lb (108.41 kg)    Body mass index is 34.17 kg/(m^2). Patient meets criteria for Obesity based on current BMI.   Current diet order is Heart Healthy, patient is consuming approximately 100% of meals at this time. Pt denies any weight loss and reports eating well PTA. He declines nutrition education as he has received it before but, he requests handouts regarding low sodium diet to review. RD provided "Low Sodium Nutrition Therapy", "Heart Healthy Shopping Tips", "Sodium-Free Flavoring Tips", and "Sodium Content of Foods List" from the Academy of [employer] and Dietetics. RD contact information provided; encouraged pt to contact RD with any questions or concerns. Labs and medications reviewed.   No nutrition interventions warranted at this time. If nutrition issues arise, please consult RD.   Pryor Ochoa RD, LDN Inpatient Clinical Dietitian Pager: [phone] After Hours Pager: [phone]

## 2015-01-05 ENCOUNTER — Inpatient Hospital Stay (HOSPITAL_COMMUNITY): Payer: Medicare Other

## 2015-01-05 LAB — BASIC METABOLIC PANEL
Anion gap: 14 (ref 5–15)
BUN: 23 mg/dL (ref 6–23)
CALCIUM: 8.6 mg/dL (ref 8.4–10.5)
CO2: 28 mmol/L (ref 19–32)
CREATININE: 1.48 mg/dL — AB (ref 0.50–1.35)
Chloride: 96 mmol/L (ref 96–112)
GFR, EST AFRICAN AMERICAN: 51 mL/min — AB (ref >90)
GFR, EST NON AFRICAN AMERICAN: 44 mL/min — AB (ref >90)
GLUCOSE: 98 mg/dL (ref 70–99)
Potassium: 4.1 mmol/L (ref 3.5–5.1)
SODIUM: 138 mmol/L (ref 135–145)

## 2015-01-05 NOTE — Progress Notes (Signed)
Patient Name: Don Jacobs Date of Encounter: 01/05/2015     Active Problems:   Acute on chronic systolic heart failure    SUBJECTIVE  Cough and dyspnea are better. Has diuresed 14 lb since admission. Still has edema. Fluid retention appears to have been related to recent oral steroids for URI. No chest pain.  CURRENT MEDS . allopurinol  300 mg Oral QHS  . apixaban  5 mg Oral BID  . calcium carbonate  1 tablet Oral QHS  . cholecalciferol  1,000 Units Oral QHS  . cyanocobalamin  500 mcg Oral Daily  . diltiazem  60 mg Oral 3 times per day  . doxazosin  8 mg Oral QHS  . furosemide  40 mg Intravenous BID  . latanoprost  1 drop Both Eyes QHS  . levothyroxine  112 mcg Oral QAC breakfast  . metoprolol tartrate  12.5 mg Oral BID  . sodium chloride  3 mL Intravenous Q12H  . vitamin C  500 mg Oral Daily    OBJECTIVE  Filed Vitals:   01/05/15 0131 01/05/15 0544 01/05/15 0631 01/05/15 0929  BP: 100/54  97/72 110/61  Pulse: 78 62 71 78  Temp: 98.4 F (36.9 C)  98.4 F (36.9 C) 97.7 F (36.5 C)  TempSrc: Oral  Oral Oral  Resp: 18  18 18   Height:      Weight:   225 lb 4.8 oz (102.195 kg)   SpO2: 96%  97% 98%    Intake/Output Summary (Last 24 hours) at 01/05/15 1132 Last data filed at 01/05/15 1000  Gross per 24 hour  Intake 1542.83 ml  Output   1925 ml  Net -382.17 ml   Filed Weights   01/03/15 2032 01/04/15 0121 01/05/15 0631  Weight: 239 lb (108.41 kg) 231 lb 8 oz (105.008 kg) 225 lb 4.8 oz (102.195 kg)    PHYSICAL EXAM  General: Pleasant, NAD. Neuro: Alert and oriented X 3. Moves all extremities spontaneously. Psych: Normal affect. HEENT:  Normal  Neck: Supple without bruits or JVD. Lungs:  Resp regular and unlabored, CTA. Heart: Irregular.no s3, s4, or murmurs. Pacer in Left upper chest. Abdomen: Soft, non-tender, non-distended, BS + x 4.  Extremities: No clubbing, cyanosis and there is 2+ edema. DP/PT/Radials 2+ and equal bilaterally.  Accessory  Clinical Findings  CBC  Recent Labs  01/03/15 2100  WBC 8.6  HGB 13.9  HCT 41.9  MCV 93.5  PLT 979*   Basic Metabolic Panel  Recent Labs  01/03/15 2100 01/04/15 0300 01/05/15 0401  NA 141  --  138  K 3.9  --  4.1  CL 98  --  96  CO2 28  --  28  GLUCOSE 96  --  98  BUN 19  --  23  CREATININE 1.46*  --  1.48*  CALCIUM 8.6  --  8.6  MG  --  1.9  --    Liver Function Tests  Recent Labs  01/03/15 2103  AST 17  ALT 17  ALKPHOS 46  BILITOT 1.4*  PROT 5.8*  ALBUMIN 3.2*   No results for input(s): LIPASE, AMYLASE in the last 72 hours. Cardiac Enzymes No results for input(s): CKTOTAL, CKMB, CKMBINDEX, TROPONINI in the last 72 hours. BNP Invalid input(s): POCBNP D-Dimer No results for input(s): DDIMER in the last 72 hours. Hemoglobin A1C No results for input(s): HGBA1C in the last 72 hours. Fasting Lipid Panel No results for input(s): CHOL, HDL, LDLCALC, TRIG, CHOLHDL, LDLDIRECT in the last  72 hours. Thyroid Function Tests  Recent Labs  01/04/15 0300  TSH 4.570*    TELE Atrial fibrillation with rate 90.  Occasional V paced beats.  ECG  Will repeat.  Radiology/Studies  Dg Chest Portable 1 View  01/03/2015   CLINICAL DATA:  Worsening shortness of breath for 8 days. History of hypertension, pacemaker.  EXAM: PORTABLE CHEST - 1 VIEW  COMPARISON:  CT of the chest July 18, 2014  FINDINGS: The cardiac silhouette appears moderately enlarged. Mediastinal silhouette is nonsuspicious. Mild pulmonary vascular congestion. Dual lead LEFT cardiac pacemaker in situ. No pleural effusions or focal consolidation. No pneumothorax. ACDF. Resected distal RIGHT clavicle. Soft tissue planes are nonsuspicious.  IMPRESSION: Cardiomegaly and mild pulmonary vascular congestion.   Electronically Signed   By: Elon Alas   On: 01/03/2015 23:23    ASSESSMENT AND PLAN 1. Acute on chronic systolic heart failure. 2. LBBB 3. Functioning PPM for initial indication of severe  bradycardia and syncope and head injury secondary to a fall. His bradycardia occurred in the setting of being on anti-arrhythmic therapy according to his wife (a Marine scientist). 4. Chronic atrial fibrillation on apixaban and diltiazem at home. BB added this admission.  Plan: I would not pursue cardiac cath at this time. This can be considered later as an outpatient if he fails to improve. Will continue Rx of his heart failure with BB and lasix. Will stop IV lasix today. Will get PA and lateral of chest today. Update EKG. Mild renal dysfunction. Consider addition of ARB later.   Signed, Darlin Coco MD

## 2015-01-05 NOTE — Progress Notes (Addendum)
Patient was on 70mL/hr cardizem drip all night.  HR now starting to be in low 60s.  Paged On-Call MD for further orders about drip.  Dr. Percival Spanish ordered to change rate to 85mL/hr.

## 2015-01-06 LAB — BASIC METABOLIC PANEL
ANION GAP: 16 — AB (ref 5–15)
BUN: 23 mg/dL (ref 6–23)
CO2: 27 mmol/L (ref 19–32)
Calcium: 9 mg/dL (ref 8.4–10.5)
Chloride: 95 mmol/L — ABNORMAL LOW (ref 96–112)
Creatinine, Ser: 1.5 mg/dL — ABNORMAL HIGH (ref 0.50–1.35)
GFR calc Af Amer: 50 mL/min — ABNORMAL LOW (ref >90)
GFR, EST NON AFRICAN AMERICAN: 43 mL/min — AB (ref >90)
Glucose, Bld: 92 mg/dL (ref 70–99)
Potassium: 3.9 mmol/L (ref 3.5–5.1)
Sodium: 138 mmol/L (ref 135–145)

## 2015-01-06 MED ORDER — DILTIAZEM HCL ER COATED BEADS 180 MG PO CP24
180.0000 mg | ORAL_CAPSULE | Freq: Every day | ORAL | Status: DC
  Administered 2015-01-06 – 2015-01-07 (×2): 180 mg via ORAL
  Filled 2015-01-06 (×2): qty 1

## 2015-01-06 MED ORDER — BENZONATATE 100 MG PO CAPS
100.0000 mg | ORAL_CAPSULE | Freq: Three times a day (TID) | ORAL | Status: DC | PRN
  Administered 2015-01-06 – 2015-01-08 (×5): 100 mg via ORAL
  Filled 2015-01-06 (×6): qty 1

## 2015-01-06 MED ORDER — FUROSEMIDE 40 MG PO TABS
40.0000 mg | ORAL_TABLET | Freq: Two times a day (BID) | ORAL | Status: DC
  Administered 2015-01-06 – 2015-01-08 (×4): 40 mg via ORAL
  Filled 2015-01-06 (×6): qty 1

## 2015-01-06 MED ORDER — GUAIFENESIN ER 600 MG PO TB12
600.0000 mg | ORAL_TABLET | Freq: Two times a day (BID) | ORAL | Status: DC
  Administered 2015-01-06 – 2015-01-08 (×5): 600 mg via ORAL
  Filled 2015-01-06 (×6): qty 1

## 2015-01-06 NOTE — Progress Notes (Signed)
Patient Name: Don Jacobs Date of Encounter: 01/06/2015     Active Problems:   Acute on chronic systolic heart failure    SUBJECTIVE  The patient is less dyspneic.  He still has a nonproductive cough.  Chest x-ray yesterday showed clearing of the previous pulmonary vascular congestion.  Rhythm is chronic atrial fibrillation.  CURRENT MEDS . allopurinol  300 mg Oral QHS  . apixaban  5 mg Oral BID  . calcium carbonate  1 tablet Oral QHS  . cholecalciferol  1,000 Units Oral QHS  . cyanocobalamin  500 mcg Oral Daily  . diltiazem  180 mg Oral Daily  . doxazosin  8 mg Oral QHS  . furosemide  40 mg Oral BID  . guaiFENesin  600 mg Oral BID  . latanoprost  1 drop Both Eyes QHS  . levothyroxine  112 mcg Oral QAC breakfast  . metoprolol tartrate  12.5 mg Oral BID  . sodium chloride  3 mL Intravenous Q12H  . vitamin C  500 mg Oral Daily    OBJECTIVE  Filed Vitals:   01/05/15 0929 01/05/15 1330 01/05/15 2031 01/06/15 0601  BP: 110/61 99/58 119/62 107/67  Pulse: 78 78 90 71  Temp: 97.7 F (36.5 C)  99.1 F (37.3 C) 97.9 F (36.6 C)  TempSrc: Oral  Oral Oral  Resp: 18 18 18 18   Height:      Weight:    222 lb 1.6 oz (100.744 kg)  SpO2: 98% 97% 98% 98%    Intake/Output Summary (Last 24 hours) at 01/06/15 1143 Last data filed at 01/06/15 1000  Gross per 24 hour  Intake    750 ml  Output   2325 ml  Net  -1575 ml   Filed Weights   01/04/15 0121 01/05/15 0631 01/06/15 0601  Weight: 231 lb 8 oz (105.008 kg) 225 lb 4.8 oz (102.195 kg) 222 lb 1.6 oz (100.744 kg)    PHYSICAL EXAM  General: Pleasant, NAD. Neuro: Alert and oriented X 3. Moves all extremities spontaneously. Psych: Normal affect. HEENT:  Normal  Neck: Supple without bruits or JVD. Lungs:  Resp regular and unlabored, CTA.  Pacemaker left upper chest Heart: Irregularly irregular no s3, s4, or murmurs. Abdomen: Soft, non-tender, non-distended, BS + x 4.  Extremities: No clubbing, cyanosis.  There is 1+  edema.  DP/PT/Radials 2+ and equal bilaterally.  Accessory Clinical Findings  CBC  Recent Labs  01/03/15 2100  WBC 8.6  HGB 13.9  HCT 41.9  MCV 93.5  PLT 950*   Basic Metabolic Panel  Recent Labs  01/04/15 0300 01/05/15 0401 01/06/15 0634  NA  --  138 138  K  --  4.1 3.9  CL  --  96 95*  CO2  --  28 27  GLUCOSE  --  98 92  BUN  --  23 23  CREATININE  --  1.48* 1.50*  CALCIUM  --  8.6 9.0  MG 1.9  --   --    Liver Function Tests  Recent Labs  01/03/15 2103  AST 17  ALT 17  ALKPHOS 46  BILITOT 1.4*  PROT 5.8*  ALBUMIN 3.2*   No results for input(s): LIPASE, AMYLASE in the last 72 hours. Cardiac Enzymes No results for input(s): CKTOTAL, CKMB, CKMBINDEX, TROPONINI in the last 72 hours. BNP Invalid input(s): POCBNP D-Dimer No results for input(s): DDIMER in the last 72 hours. Hemoglobin A1C No results for input(s): HGBA1C in the last 72 hours. Fasting Lipid  Panel No results for input(s): CHOL, HDL, LDLCALC, TRIG, CHOLHDL, LDLDIRECT in the last 72 hours. Thyroid Function Tests  Recent Labs  01/04/15 0300  TSH 4.570*    TELE  Atrial fibrillation with variable ventricular response.  Occasional paced ventricular beats.  ECG    Radiology/Studies  Dg Chest 2 View  01/05/2015   CLINICAL DATA:  Dry cough for 8 days, congestive heart failure  EXAM: CHEST  2 VIEW  COMPARISON:  01/03/2015  FINDINGS: Cardiomegaly again noted. No acute infiltrate or pleural effusion. No pulmonary edema. Dual lead cardiac pacemaker is unchanged in position. Osteopenia and mild degenerative changes thoracic spine.  IMPRESSION: No active cardiopulmonary disease.   Electronically Signed   By: Lahoma Crocker M.D.   On: 01/05/2015 16:56   Dg Chest Portable 1 View  01/03/2015   CLINICAL DATA:  Worsening shortness of breath for 8 days. History of hypertension, pacemaker.  EXAM: PORTABLE CHEST - 1 VIEW  COMPARISON:  CT of the chest July 18, 2014  FINDINGS: The cardiac silhouette appears  moderately enlarged. Mediastinal silhouette is nonsuspicious. Mild pulmonary vascular congestion. Dual lead LEFT cardiac pacemaker in situ. No pleural effusions or focal consolidation. No pneumothorax. ACDF. Resected distal RIGHT clavicle. Soft tissue planes are nonsuspicious.  IMPRESSION: Cardiomegaly and mild pulmonary vascular congestion.   Electronically Signed   By: Elon Alas   On: 01/03/2015 23:23    ASSESSMENT AND PLAN  1. Acute on chronic systolic heart failure.  EF 40-45% 2. LBBB 3. Functioning PPM for initial indication of severe bradycardia and syncope and head injury secondary to a fall. His bradycardia occurred in the setting of being on anti-arrhythmic therapy according to his wife (a Marine scientist). 4. Chronic atrial fibrillation on apixaban and diltiazem at home. BB added this admission.  Plan: His lungs are clear to auscultation.  He still has a nonproductive cough.  He is not on an ace or an ARB.  Consider addition of ARB later as an outpatient for left ventricular systolic dysfunction once his cough has resolved.  We will add Mucinex plain 600 milligrams twice a day.  Will also try Tessalon Perles. We will switch to oral Lasix today.  Possible discharge home tomorrow    Signed, Darlin Coco MD

## 2015-01-07 ENCOUNTER — Inpatient Hospital Stay (HOSPITAL_COMMUNITY): Payer: Medicare Other

## 2015-01-07 DIAGNOSIS — I481 Persistent atrial fibrillation: Secondary | ICD-10-CM

## 2015-01-07 LAB — BASIC METABOLIC PANEL
ANION GAP: 8 (ref 5–15)
BUN: 25 mg/dL — ABNORMAL HIGH (ref 6–23)
CO2: 32 mmol/L (ref 19–32)
CREATININE: 1.5 mg/dL — AB (ref 0.50–1.35)
Calcium: 8.8 mg/dL (ref 8.4–10.5)
Chloride: 99 mmol/L (ref 96–112)
GFR calc non Af Amer: 43 mL/min — ABNORMAL LOW (ref >90)
GFR, EST AFRICAN AMERICAN: 50 mL/min — AB (ref >90)
Glucose, Bld: 94 mg/dL (ref 70–99)
POTASSIUM: 3.4 mmol/L — AB (ref 3.5–5.1)
Sodium: 139 mmol/L (ref 135–145)

## 2015-01-07 MED ORDER — LIDOCAINE HCL (PF) 1 % IJ SOLN
INTRAMUSCULAR | Status: AC
  Filled 2015-01-07: qty 5

## 2015-01-07 MED ORDER — LIDOCAINE HCL (CARDIAC) 20 MG/ML IV SOLN
INTRAVENOUS | Status: AC
  Filled 2015-01-07: qty 5

## 2015-01-07 MED ORDER — METOPROLOL TARTRATE 25 MG PO TABS
25.0000 mg | ORAL_TABLET | Freq: Two times a day (BID) | ORAL | Status: DC
Start: 2015-01-07 — End: 2015-01-08
  Administered 2015-01-07: 25 mg via ORAL
  Filled 2015-01-07 (×3): qty 1

## 2015-01-07 MED ORDER — LEVOTHYROXINE SODIUM 125 MCG PO TABS
125.0000 ug | ORAL_TABLET | Freq: Every day | ORAL | Status: DC
  Administered 2015-01-08: 125 ug via ORAL
  Filled 2015-01-07 (×2): qty 1

## 2015-01-07 NOTE — Consult Note (Signed)
ORTHOPAEDIC CONSULTATION  REQUESTING PHYSICIAN: Jettie Booze, MD  Chief Complaint: left knee pain  HPI: Don Jacobs is a 79 y.o. male who complains of  Left knee pain.  The patient has a history of osteoarthritis in the left knee that he reports swells every few months and requires aspiration and injection with corticosteroid.  He thinks that the last aspiration/injection was about 3 months ago.  He is not a candidate for total knee replacement at this time due to his heart condition.  He first noticed the knee swelling on him yesterday and now he has limited ROM of the knee.  He denies fevers/chills or warmth of the knee.  Past Medical History  Diagnosis Date  . Hypertension   . A-fib   . Sleep apnea   . Gout   . Hypothyroidism   . Allergic rhinitis   . ED (erectile dysfunction)   . BPH (benign prostatic hyperplasia)   . Hearing loss     uses hearing a cyst  . Hx of migraines   . Prostatitis, chronic   . Spondylolisthesis 07/2010  . Seborrheic keratosis   . Aortic aneurysm, thoracic 05/25/2011  . Pacemaker    Past Surgical History  Procedure Laterality Date  . Back surgery    . L5 selective nerve root block      Dr Nelva Bush  . Foot surgery Right ~ 2007    "reconstruction" (02/17/2013)  . Shoulder open rotator cuff repair Right 09/2007; 2013  . Repair peroneal tendons ankle Right 10/2003  . Knee arthroscopy Left ~ 2007  . Anterior cervical decomp/discectomy fusion  10/2000    "C4, 5, 6 w/fusion" (02/17/2013)  . Lumbar spine surgery  2012    "bone graft, Dr. [name]" (02/17/2013)  . Insert / replace / remove pacemaker  02/17/2013  . Cardiac catheterization  2000's    "once" (02/17/2013)  . Cardioversion N/A 04/09/2014    Procedure: CARDIOVERSION;  Surgeon: Lelon Perla, MD;  Location: [facility] ENDOSCOPY;  Service: Cardiovascular;  Laterality: N/A;  . Permanent pacemaker insertion N/A 02/17/2013    Procedure: PERMANENT PACEMAKER INSERTION;  Surgeon: Deboraha Sprang,  MD;  Location: [facility] CATH LAB;  Service: Cardiovascular;  Laterality: N/A;   History   Social History  . Marital Status: Married    Spouse Name: N/A  . Number of Children: 2  . Years of Education: N/A   Occupational History  . retired    Social History Main Topics  . Smoking status: Former Smoker -- 1.00 packs/day for 12 years    Types: Cigarettes    Quit date: 03/11/1981  . Smokeless tobacco: Never Used  . Alcohol Use: No  . Drug Use: No  . Sexual Activity: Not on file   Other Topics Concern  . None   Social History Narrative   Family History  Problem Relation Age of Onset  . Heart disease Father    Allergies  Allergen Reactions  . Penicillins Shortness Of Breath and Rash    Ended up at ER after using  . Oxycodone Other (See Comments)    Altered mental status   Prior to Admission medications   Medication Sig Start Date End Date Taking? Authorizing Provider  allopurinol (ZYLOPRIM) 300 MG tablet Take 300 mg by mouth at bedtime.    Yes Historical Provider, MD  apixaban (ELIQUIS) 5 MG TABS tablet Take 1 tablet (5 mg total) by mouth 2 (two) times daily. 01/25/14  Yes Jettie Booze, MD  Calcium Carbonate (CALTRATE 600 PO) Take 600 mg by mouth at bedtime.    Yes Historical Provider, MD  cholecalciferol (VITAMIN D) 1000 UNITS tablet Take 1,000 Units by mouth at bedtime.   Yes Historical Provider, MD  cyanocobalamin 500 MCG tablet Take 500 mcg by mouth daily.   Yes Historical Provider, MD  dextromethorphan (DELSYM) 30 MG/5ML liquid Take 15 mg by mouth as needed for cough.   Yes Historical Provider, MD  diltiazem (CARDIZEM CD) 180 MG 24 hr capsule Take 1 capsule (180 mg total) by mouth daily. 07/23/14  Yes Jettie Booze, MD  doxazosin (CARDURA) 8 MG tablet Take 8 mg by mouth at bedtime.     Yes Historical Provider, MD  furosemide (LASIX) 40 MG tablet Take 40 mg by mouth daily as needed (swelling).   Yes Historical Provider, MD  guaiFENesin (ROBITUSSIN) 100 MG/5ML SOLN  Take 10 mLs by mouth every 4 (four) hours as needed for cough or to loosen phlegm.   Yes Historical Provider, MD  guaiFENesin-codeine (ROBITUSSIN AC) 100-10 MG/5ML syrup Take 5 mLs by mouth 3 (three) times daily as needed for cough.   Yes Historical Provider, MD  latanoprost (XALATAN) 0.005 % ophthalmic solution Place 1 drop into both eyes at bedtime.   Yes Historical Provider, MD  levothyroxine (SYNTHROID, LEVOTHROID) 112 MCG tablet Take 112 mcg by mouth daily before breakfast.    Yes Historical Provider, MD  loratadine (CLARITIN) 10 MG tablet Take 5 mg by mouth 2 (two) times daily as needed for allergies or rhinitis.    Yes Historical Provider, MD  Misc Natural Products (SAW PALMETTO) CAPS Take 1 capsule by mouth daily.   Yes Historical Provider, MD  OVER THE COUNTER MEDICATION Take 1 tablet by mouth daily. muscudine seed   Yes Historical Provider, MD  predniSONE (DELTASONE) 10 MG tablet Take 10 mg by mouth daily as needed (gout flare up).    Yes Historical Provider, MD  predniSONE (STERAPRED UNI-PAK) 10 MG tablet Take 10-60 tablets by mouth daily.   Yes Historical Provider, MD  vitamin C (ASCORBIC ACID) 500 MG tablet Take 500 mg by mouth daily.    Yes Historical Provider, MD  colchicine 0.6 MG tablet Take 0.6 mg by mouth daily as needed (gout).     Historical Provider, MD   Dg Chest 2 View  01/05/2015   CLINICAL DATA:  Dry cough for 8 days, congestive heart failure  EXAM: CHEST  2 VIEW  COMPARISON:  01/03/2015  FINDINGS: Cardiomegaly again noted. No acute infiltrate or pleural effusion. No pulmonary edema. Dual lead cardiac pacemaker is unchanged in position. Osteopenia and mild degenerative changes thoracic spine.  IMPRESSION: No active cardiopulmonary disease.   Electronically Signed   By: Lahoma Crocker M.D.   On: 01/05/2015 16:56   Dg Knee 1-2 Views Left  01/07/2015   CLINICAL DATA:  Knee pain and swelling.  EXAM: LEFT KNEE - 1-2 VIEW  COMPARISON:  None.  FINDINGS: Left knee is located. Mild  degenerative changes are evident. Chondrocalcinosis is present in the menisci. A moderate-sized joint effusion is present. There is calcification along the patellar tendon just above the patella. No acute fracture is evident.  IMPRESSION: 1. Moderate-sized joint effusion without acute fracture. Ligamentous injury is suspected. 2. Mild tricompartmental degenerative changes. 3. Chondrocalcinosis of the menisci.   Electronically Signed   By: San Morelle M.D.   On: 01/07/2015 15:56    Positive ROS: All other systems have been reviewed and were otherwise negative with  the exception of those mentioned in the HPI and as above.  Labs cbc No results for input(s): WBC, HGB, HCT, PLT in the last 72 hours.  Labs inflam No results for input(s): CRP in the last 72 hours.  Invalid input(s): ESR  Labs coag No results for input(s): INR, PTT in the last 72 hours.  Invalid input(s): PT   Recent Labs  01/06/15 0634 01/07/15 0345  NA 138 139  K 3.9 3.4*  CL 95* 99  CO2 27 32  GLUCOSE 92 94  BUN 23 25*  CREATININE 1.50* 1.50*  CALCIUM 9.0 8.8    Physical Exam: Filed Vitals:   01/07/15 1424  BP: 116/56  Pulse: 88  Temp: 98 F (36.7 C)  Resp: 18   General: Alert, no acute distress Cardiovascular: No pedal edema Respiratory: No cyanosis, no use of accessory musculature GI: No organomegaly, abdomen is soft and non-tender Skin: No lesions in the area of chief complaint other than those listed below in MSK exam.  Neurologic: Sensation intact distally Psychiatric: Patient is competent for consent with normal mood and affect Lymphatic: No axillary or cervical lymphadenopathy  MUSCULOSKELETAL:  Left knee is swollen but no erythema or warmth. Diffusely tender to palpation.  Limited ROM due to swelling and pain.  Sensation is intact with 2+ pulses distally. Other extremities are atraumatic with painless ROM and NVI.  Assessment: Osteoarthritis of the left knee Left knee  effusion  Plan: The patient would like to have the knee aspirated and re-inject.  We will also wrap the knee in an ace wrap to apply some gently compression.  He will follow-up with his outpatient orthopedic once discharged from the hospital. Weight Bearing Status: WBAT in the left knee    01/07/2015  4:37 PM  PATIENT:  Don Jacobs    PRE-PROCEDURE DIAGNOSIS: left knee effusion  POST-OPERATIVE DIAGNOSIS:  Same  PROCEDURE:  Intra-articular injection left knee  PROCEDURE DETAILS:  After informed verbal consent was obtained the superolateral portal was prepped with alcohol and the knee was aspirated, yielding a total of 50 ml and 4 ml of Marcaine and 1 ml of Depo-Medrol ($RemoveBeforeD'40mg'[id number]$ ) was injected. He tolerated this well and a Band-Aid was placed.   [facility] [phone]   01/07/2015 4:24 PM

## 2015-01-07 NOTE — Progress Notes (Signed)
Patient Name: Don Jacobs Date of Encounter: 01/07/2015     Active Problems:   Acute on chronic systolic heart failure    SUBJECTIVE  Cough much improved. Breathing improved. Still with some LE edema. No CP. Severe R knee pain due to chronic knee issues/effusion followed closely by orthopedist in [place]. Felt a little weak and dizzy this AM.  He and his wife do not feel ready for discharge today.    CURRENT MEDS . allopurinol  300 mg Oral QHS  . apixaban  5 mg Oral BID  . calcium carbonate  1 tablet Oral QHS  . cholecalciferol  1,000 Units Oral QHS  . cyanocobalamin  500 mcg Oral Daily  . diltiazem  180 mg Oral Daily  . doxazosin  8 mg Oral QHS  . furosemide  40 mg Oral BID  . guaiFENesin  600 mg Oral BID  . latanoprost  1 drop Both Eyes QHS  . levothyroxine  112 mcg Oral QAC breakfast  . metoprolol tartrate  12.5 mg Oral BID  . sodium chloride  3 mL Intravenous Q12H  . vitamin C  500 mg Oral Daily    OBJECTIVE  Filed Vitals:   01/06/15 1300 01/06/15 1800 01/06/15 2134 01/07/15 0419  BP: 130/65 117/78 107/64 105/55  Pulse: 91 69 71 105  Temp: 97.8 F (36.6 C) 98 F (36.7 C) 98.9 F (37.2 C) 99 F (37.2 C)  TempSrc: Oral Oral Oral Oral  Resp: 18 18 18 16   Height:      Weight:    221 lb 11.2 oz (100.562 kg)  SpO2: 95% 96% 99% 96%    Intake/Output Summary (Last 24 hours) at 01/07/15 0958 Last data filed at 01/07/15 0828  Gross per 24 hour  Intake   1200 ml  Output   2025 ml  Net   -825 ml   Filed Weights   01/05/15 0631 01/06/15 0601 01/07/15 0419  Weight: 225 lb 4.8 oz (102.195 kg) 222 lb 1.6 oz (100.744 kg) 221 lb 11.2 oz (100.562 kg)    PHYSICAL EXAM  General: Pleasant, NAD. Obese.  Neuro: Alert and oriented X 3. Moves all extremities spontaneously. Psych: Normal affect. HEENT:  Normal  Neck: Supple without bruits or JVD. Lungs:  Resp regular and unlabored, CTA. Heart: RRR no s3, s4, or murmurs. Abdomen: Soft, non-tender, non-distended, BS + x  4.  Extremities: No clubbing, cyanosis. DP/PT/Radials 2+ and equal bilaterally. Bilat 1+ pitting edema. R knee effusion. No ecchymosis or warmth  Accessory Clinical Findings  CBC No results for input(s): WBC, NEUTROABS, HGB, HCT, MCV, PLT in the last 72 hours. Basic Metabolic Panel  Recent Labs  01/06/15 0634 01/07/15 0345  NA 138 139  K 3.9 3.4*  CL 95* 99  CO2 27 32  GLUCOSE 92 94  BUN 23 25*  CREATININE 1.50* 1.50*  CALCIUM 9.0 8.8    TELE  Afib w/ LBBB. Rates in 110-120s. Rates not well controlled this AM.   Radiology/Studies  Dg Chest 2 View  01/05/2015   CLINICAL DATA:  Dry cough for 8 days, congestive heart failure  EXAM: CHEST  2 VIEW  COMPARISON:  01/03/2015  FINDINGS: Cardiomegaly again noted. No acute infiltrate or pleural effusion. No pulmonary edema. Dual lead cardiac pacemaker is unchanged in position. Osteopenia and mild degenerative changes thoracic spine.  IMPRESSION: No active cardiopulmonary disease.   Electronically Signed   By: Lahoma Crocker M.D.   On: 01/05/2015 16:56   Dg Chest Portable  1 View  01/03/2015   CLINICAL DATA:  Worsening shortness of breath for 8 days. History of hypertension, pacemaker.  EXAM: PORTABLE CHEST - 1 VIEW  COMPARISON:  CT of the chest July 18, 2014  FINDINGS: The cardiac silhouette appears moderately enlarged. Mediastinal silhouette is nonsuspicious. Mild pulmonary vascular congestion. Dual lead LEFT cardiac pacemaker in situ. No pleural effusions or focal consolidation. No pneumothorax. ACDF. Resected distal RIGHT clavicle. Soft tissue planes are nonsuspicious.  IMPRESSION: Cardiomegaly and mild pulmonary vascular congestion.   Electronically Signed   By: Elon Alas   On: 01/03/2015 23:23    ASSESSMENT AND PLAN  Don Jacobs is a 79 y.o. male with a history of HTN, persistent AF on apixaban, hypothyroidism, bradycardia s/p PPM, newly diagnosed LBBB (12/05/14) who presented to [facility] on 01/03/15 with new onset HF and AF with RVR.     Acute on chronic systolic heart failure.  -- EF 40-45% on echo 12/17/14 (down from 55-60% 02/08/14). Also recent echo showed diffuse hypokinesis. There is akinesis of the basal inferoseptal and basal inferior myocardium. New compared to prior echo in 2015. Subsequent f/u NST (12/18/14) showed no ischemia.  -- Upon admission BNP mildly elevated to 228 and CXR with pulm edema. Acute exacerbation felt to be related to recent oral steroids for URI and afib w/ RVR -- Net neg 4.2L. Weight down 18 lbs (239--> 221lbs). CXR yesterday with resolved pulmonary edema. Breathing improved. Still with some pitting edema in LE edema. Switched to oral Lasix yesterday 40mg  BID. Continue to monitor on this dosage.   URI- cough improved after Mucinex 600 milligrams BID and Tessalon Perles added yesterday.   LBBB - newly diagnosed (12/05/14)  Hx of bradycardia- s/p PPM. -- Functioning PPM for initial indication of severe bradycardia and syncope and head injury secondary to a fall. His bradycardia occurred in the setting of being on anti-arrhythmic therapy according to his wife (a Marine scientist).  Chronic atrial fibrillation - rates have not been well controlled. -- He has been continued on home diltiazem CD 180mg . BB added this admission.  He is currently on lopressor 12.5 mg BID. We will discontinue CCB in the setting of LV dysfunction and up-titrate metoprolol to 25mg  BID.  -- Continue apixaban (creat elevated but still okay for 5mg  dosing)   TSH- mildly elevated at 4.57. Will increase his levothyroxine from 112 mcg to 130mcg. He will need follow up with his PCP.  Left Knee Pain with effusion- He has a long hx of this and usually see's his orthopedic MD in [place] for aspiration and steroid shot. If he stays today we can have ortho consult.  CKD- creat 1.5 today ( it has been stable/elevated this admission) Previously 1.10 (06/2014). Unclear if this is his new baseline.   Judy Pimple PA-C  Pager  [phone]  Attending Note:   The patient was seen and examined.  Agree with assessment and plan as noted above.  Changes made to the above note as needed.  Pt is significantly better. Still has atrial fib - rates are a bit high - especially with ambulation. Will Dc dilt , increase metoprolol We can decrease his cardura if his BP drops.    2. Chronic systolic CHF:  Continue Rx with metoprolol   3. Knee pain :  .  This is a chronic problem.  Family is very concerned about his knee.   His best option may be for him to see his own orthopedic doctor.  I doubt  an ortho consult will be very productive.     Thayer Headings, Brooke Bonito., MD, [facility] 01/07/2015, 11:02 AM [address]  [place] Pager [phone]

## 2015-01-08 ENCOUNTER — Other Ambulatory Visit: Payer: Self-pay | Admitting: Physician Assistant

## 2015-01-08 ENCOUNTER — Telehealth: Payer: Self-pay | Admitting: Interventional Cardiology

## 2015-01-08 ENCOUNTER — Encounter (HOSPITAL_COMMUNITY): Payer: Self-pay | Admitting: Physician Assistant

## 2015-01-08 DIAGNOSIS — R001 Bradycardia, unspecified: Secondary | ICD-10-CM | POA: Diagnosis present

## 2015-01-08 DIAGNOSIS — I5022 Chronic systolic (congestive) heart failure: Secondary | ICD-10-CM | POA: Diagnosis present

## 2015-01-08 DIAGNOSIS — I5042 Chronic combined systolic (congestive) and diastolic (congestive) heart failure: Secondary | ICD-10-CM

## 2015-01-08 DIAGNOSIS — I482 Chronic atrial fibrillation, unspecified: Secondary | ICD-10-CM | POA: Diagnosis present

## 2015-01-08 LAB — BASIC METABOLIC PANEL
Anion gap: 11 (ref 5–15)
BUN: 26 mg/dL — AB (ref 6–23)
CO2: 31 mmol/L (ref 19–32)
CREATININE: 1.34 mg/dL (ref 0.50–1.35)
Calcium: 9.1 mg/dL (ref 8.4–10.5)
Chloride: 96 mmol/L (ref 96–112)
GFR calc Af Amer: 58 mL/min — ABNORMAL LOW (ref >90)
GFR, EST NON AFRICAN AMERICAN: 50 mL/min — AB (ref >90)
Glucose, Bld: 102 mg/dL — ABNORMAL HIGH (ref 70–99)
POTASSIUM: 3.2 mmol/L — AB (ref 3.5–5.1)
Sodium: 138 mmol/L (ref 135–145)

## 2015-01-08 MED ORDER — LEVOTHYROXINE SODIUM 112 MCG PO TABS: 112.0000 ug | ORAL_TABLET | Freq: Every day | ORAL | Status: AC

## 2015-01-08 MED ORDER — FUROSEMIDE 40 MG PO TABS
40.0000 mg | ORAL_TABLET | Freq: Every day | ORAL | Status: DC
Start: 2015-01-09 — End: 2015-01-08

## 2015-01-08 MED ORDER — POTASSIUM CHLORIDE CRYS ER 10 MEQ PO TBCR: 10.0000 meq | EXTENDED_RELEASE_TABLET | Freq: Every day | ORAL | Status: DC

## 2015-01-08 MED ORDER — METOPROLOL TARTRATE 50 MG PO TABS
50.0000 mg | ORAL_TABLET | Freq: Two times a day (BID) | ORAL | Status: DC
  Administered 2015-01-08: 50 mg via ORAL
  Filled 2015-01-08 (×2): qty 1

## 2015-01-08 MED ORDER — POTASSIUM CHLORIDE CRYS ER 20 MEQ PO TBCR
40.0000 meq | EXTENDED_RELEASE_TABLET | Freq: Once | ORAL | Status: AC
  Administered 2015-01-08: 40 meq via ORAL
  Filled 2015-01-08: qty 2

## 2015-01-08 MED ORDER — METOPROLOL TARTRATE 50 MG PO TABS: 50.0000 mg | ORAL_TABLET | Freq: Two times a day (BID) | ORAL | Status: DC

## 2015-01-08 MED ORDER — BENZONATATE 100 MG PO CAPS
100.0000 mg | ORAL_CAPSULE | Freq: Three times a day (TID) | ORAL | Status: DC | PRN
Start: 2015-01-08 — End: 2017-02-10

## 2015-01-08 MED ORDER — DOXAZOSIN MESYLATE 4 MG PO TABS: 4.0000 mg | ORAL_TABLET | Freq: Every day | ORAL | Status: DC

## 2015-01-08 MED ORDER — POTASSIUM CHLORIDE CRYS ER 10 MEQ PO TBCR
10.0000 meq | EXTENDED_RELEASE_TABLET | Freq: Every day | ORAL | Status: DC
  Administered 2015-01-08: 10 meq via ORAL
  Filled 2015-01-08 (×2): qty 1

## 2015-01-08 MED ORDER — DOXAZOSIN MESYLATE 4 MG PO TABS
4.0000 mg | ORAL_TABLET | Freq: Every day | ORAL | Status: DC
  Filled 2015-01-08: qty 1

## 2015-01-08 MED ORDER — FUROSEMIDE 40 MG PO TABS: 40.0000 mg | ORAL_TABLET | Freq: Every day | ORAL | Status: DC

## 2015-01-08 NOTE — Progress Notes (Signed)
Patient Name: Don Jacobs Date of Encounter: 01/08/2015     Active Problems:   Acute on chronic systolic heart failure    SUBJECTIVE  Cough much improved. Breathing improved. Feeling well and seen eating breakfast.  CURRENT MEDS . allopurinol  300 mg Oral QHS  . apixaban  5 mg Oral BID  . calcium carbonate  1 tablet Oral QHS  . cholecalciferol  1,000 Units Oral QHS  . cyanocobalamin  500 mcg Oral Daily  . doxazosin  8 mg Oral QHS  . furosemide  40 mg Oral BID  . guaiFENesin  600 mg Oral BID  . latanoprost  1 drop Both Eyes QHS  . levothyroxine  125 mcg Oral QAC breakfast  . metoprolol tartrate  25 mg Oral BID  . sodium chloride  3 mL Intravenous Q12H  . vitamin C  500 mg Oral Daily    OBJECTIVE  Filed Vitals:   01/07/15 1424 01/07/15 1808 01/07/15 2134 01/08/15 0603  BP: 116/56 115/58 105/57 100/59  Pulse: 88 76 111   Temp: 98 F (36.7 C)  98.8 F (37.1 C) 98.4 F (36.9 C)  TempSrc: Oral  Oral Oral  Resp: 18  18 18   Height:      Weight:    219 lb 6.4 oz (99.519 kg)  SpO2: 97%  97% 95%    Intake/Output Summary (Last 24 hours) at 01/08/15 0755 Last data filed at 01/08/15 6767  Gross per 24 hour  Intake   1395 ml  Output    851 ml  Net    544 ml   Filed Weights   01/06/15 0601 01/07/15 0419 01/08/15 0603  Weight: 222 lb 1.6 oz (100.744 kg) 221 lb 11.2 oz (100.562 kg) 219 lb 6.4 oz (99.519 kg)    PHYSICAL EXAM  General: Pleasant, NAD. Obese.  Neuro: Alert and oriented X 3. Moves all extremities spontaneously. Psych: Normal affect. HEENT:  Normal  Neck: Supple without bruits or JVD. Lungs:  Resp regular and unlabored, CTA. Heart: irreg irreg, tachy. no s3, s4, or murmurs. Abdomen: Soft, non-tender, non-distended, BS + x 4.  Extremities: No clubbing, cyanosis. DP/PT/Radials 2+ and equal bilaterally. Bilat 1+ pitting edema. R knee effusion. No ecchymosis or warmth  Accessory Clinical Findings  CBC No results for input(s): WBC, NEUTROABS, HGB,  HCT, MCV, PLT in the last 72 hours. Basic Metabolic Panel  Recent Labs  01/06/15 0634 01/07/15 0345  NA 138 139  K 3.9 3.4*  CL 95* 99  CO2 27 32  GLUCOSE 92 94  BUN 23 25*  CREATININE 1.50* 1.50*  CALCIUM 9.0 8.8    TELE  Afib w/ LBBB. Rates in 110-120s. Rates not well controlled this AM. Increases with ambulation  Radiology/Studies  Dg Chest 2 View  01/05/2015   CLINICAL DATA:  Dry cough for 8 days, congestive heart failure  EXAM: CHEST  2 VIEW  COMPARISON:  01/03/2015  FINDINGS: Cardiomegaly again noted. No acute infiltrate or pleural effusion. No pulmonary edema. Dual lead cardiac pacemaker is unchanged in position. Osteopenia and mild degenerative changes thoracic spine.  IMPRESSION: No active cardiopulmonary disease.   Electronically Signed   By: Lahoma Crocker M.D.   On: 01/05/2015 16:56   Dg Knee 1-2 Views Left  01/07/2015   CLINICAL DATA:  Knee pain and swelling.  EXAM: LEFT KNEE - 1-2 VIEW  COMPARISON:  None.  FINDINGS: Left knee is located. Mild degenerative changes are evident. Chondrocalcinosis is present in the menisci. A moderate-sized  joint effusion is present. There is calcification along the patellar tendon just above the patella. No acute fracture is evident.  IMPRESSION: 1. Moderate-sized joint effusion without acute fracture. Ligamentous injury is suspected. 2. Mild tricompartmental degenerative changes. 3. Chondrocalcinosis of the menisci.   Electronically Signed   By: San Morelle M.D.   On: 01/07/2015 15:56   Dg Chest Portable 1 View  01/03/2015   CLINICAL DATA:  Worsening shortness of breath for 8 days. History of hypertension, pacemaker.  EXAM: PORTABLE CHEST - 1 VIEW  COMPARISON:  CT of the chest July 18, 2014  FINDINGS: The cardiac silhouette appears moderately enlarged. Mediastinal silhouette is nonsuspicious. Mild pulmonary vascular congestion. Dual lead LEFT cardiac pacemaker in situ. No pleural effusions or focal consolidation. No pneumothorax. ACDF.  Resected distal RIGHT clavicle. Soft tissue planes are nonsuspicious.  IMPRESSION: Cardiomegaly and mild pulmonary vascular congestion.   Electronically Signed   By: Elon Alas   On: 01/03/2015 23:23    ASSESSMENT AND PLAN  Don Jacobs is a 79 y.o. male with a history of HTN, persistent AF on apixaban, hypothyroidism, bradycardia s/p PPM, newly diagnosed LBBB (12/05/14) who presented to [facility] on 01/03/15 with new onset HF and AF with RVR.   Acute on chronic systolic heart failure.  -- EF 40-45% on echo 12/17/14 (down from 55-60% 02/08/14). Also recent echo showed diffuse hypokinesis. There is akinesis of the basal inferoseptal and basal inferior myocardium. New compared to prior echo in 2015. Subsequent f/u NST (12/18/14) showed no ischemia.  -- Upon admission BNP mildly elevated to 228 and CXR with pulm edema. Acute exacerbation felt to be related to recent oral steroids for URI and afib w/ RVR -- Net neg 3.9L. Weight down 20 lbs (239--> 219lbs). Repeat CXR with resolved pulmonary edema. Breathing improved. Switched to oral Lasix 40mg  BID 01/06/15 and continuing to diurese.   URI- cough improved after Mucinex 600 milligrams BID and Tessalon Perles   LBBB - newly diagnosed (12/05/14)  Hx of bradycardia- s/p PPM. -- Functioning PPM for initial indication of severe bradycardia and syncope and head injury secondary to a fall. His bradycardia occurred in the  setting of being on anti-arrhythmic therapy according to his wife (a Marine scientist).  Chronic atrial fibrillation - Still has atrial fib - rates are a bit high - especially with ambulation. -- Home diltiazem CD 180mg  discontinued in the setting of LV dysfunction. BB added this admission.  We have been up-titrating metoprolol for better rate control. However, we are limited by soft pressures. We will cut his home cardura down from 8mg  to 4mg  to allow more room for rate control. Will give 50mg  metoprolol this AM and see how he does. -- Continue apixaban  (creat elevated but still okay for 5mg  dosing) He has been on amiodarone in the past.  It was stopped after he fell, hit his head and had an intracerebral bleed. He now has a pacer and could be tried on amio again in the future if needed.    TSH- mildly elevated at 4.57. Will increase his levothyroxine from 112 mcg to 139mcg. He will need follow up with his PCP.  Left Knee Pain with effusion- Seen by ortho yesterday who performed an aspiration and steroid injection. He is feeling much better  CKD- creat 1.5 ( it has been stable/elevated this admission) Previously 1.10 (06/2014). Unclear if this is his new baseline.  Will decrease lasix to 40 a day.  I think he may be volume depleted.  Dispo- hopefully home this afternoon with close outpatient follow up next week. He will need printed Rx's for new prescriptions to take to the [place].   Judy Pimple PA-C  Pager [phone]  Attending Note:   The patient was seen and examined.  Agree with assessment and plan as noted above.  Changes made to the above note as needed.  Will ambulate.   I have edited note.  Decrease lasix,  Metoprolol increased to 50 bid.  anticpiate Dc later today    Thayer Headings, Brooke Bonito., MD, [facility] 01/08/2015, 10:07 AM [address],  [place] Pager [phone]

## 2015-01-08 NOTE — Discharge Instructions (Signed)

## 2015-01-08 NOTE — Discharge Summary (Signed)
Discharge Summary   Patient ID: Don Jacobs MRN: [id number], DOB/AGE: [birth year] 79 y.o. Admit date: 01/03/2015 D/C date:     01/08/2015  Primary Cardiologist: Dr Irish Lack  Principal Problem:   Acute on chronic systolic heart failure Active Problems:   Obstructive sleep apnea   Hypertension   Gout   Hypothyroidism   BPH (benign prostatic hyperplasia)   Obesity   Symptomatic bradycardia   Chronic atrial fibrillation   Chronic systolic CHF (congestive heart failure)   Admission Dates: 01/03/15-01/08/15 Discharge Diagnosis:  HPI: Don Jacobs is a 79 y.o. male with a history of HTN, persistent AF on apixaban, hypothyroidism, bradycardia s/p PPM, and newly diagnosed LBBB (12/05/14) who presented to [facility] on 01/03/15 with new onset HF and AF with RVR   Mr. Goulette was in his USOH until 8 days ago when he developed a mainly dry cough. He saw a PCP who prescribed steroids and codeine for presumptive respiratory infection. He did not like how it made him feel and he stopped the pred 4 days ago and stopped the cough suppressant 3 days ago. Around that time, he noted progressive abdominal distention and "pressure." 2 days ago, he developed PND, orthopnea, and a 5 pound weight gain. He reported sleeping parts of the last 2 nights in a chair. The day PTA, he noticed LE edema and experienced some dizzy spells. Wife noted tachycardia to the day of admission and called for advice. She was instructed to have Mr. Corp come to the ER for evaluation. Of note, he has not taken his Lasix 40 mg daily over the the last couple of days because his legs have not been swelling. He does not regularly check weights.   On arrival to the Er, Mr. Reister was in AF with RVR (158bpm), BP 109/71, 995 on RA, afebrile. ECG confirmed AF with RVR and known LBBB. Labs were notable for K 3.9, Cr 1.46 (baseline 1.1-1.2), BNP 228, POC TnI 0.00. CXR demonstrated Cardiomegaly and mild pulmonary vascular congestion. He was started on a  diltiazem drip.   Of note, Mr. Lebarron underwent a low risk nuclear study on 12/17/14. A TTE on the same day demonstrated EF 40-45%. The EF was decreased from 55% to 60% on 01/2014.    Hospital Course   Acute on chronic systolic heart failure.  -- EF 40-45% on echo 12/17/14 (down from 55-60% 02/08/14). Also recent echo showed diffuse hypokinesis. There is akinesis of the basal inferoseptal and basal inferior myocardium. New compared to prior echo in 2015. Subsequent f/u NST (12/18/14) showed no ischemia.  -- Upon admission BNP mildly elevated to 228 and CXR with pulm edema. Acute exacerbation felt to be related to recent oral steroids for URI and afib w/ RVR -- Net neg 3.9L. Weight down 20 lbs (239--> 219lbs). Repeat CXR with resolved pulmonary edema. Breathing improved. Switched to oral Lasix 40mg  BID 01/06/15 and continuing to diurese. This was further decreased to 40mg  po qd on the day of discharge.   URI- cough improved after Mucinex 600 milligrams BID and Tessalon Perles   LBBB - newly diagnosed (12/05/14)  Hx of bradycardia- s/p PPM. -- Functioning PPM for initial indication of severe bradycardia and syncope and head injury secondary to a fall. His bradycardia occurred in the setting of being on anti-arrhythmic therapy according to his wife (a Marine scientist).  Chronic atrial fibrillation - Still has atrial fib - his rates were difficult to control, especially with ambulation. -- Home diltiazem CD 180mg  discontinued  in the setting of LV dysfunction. BB added this admission. We have been up-titrating metoprolol for better rate control. However, we are limited by soft pressures. We will cut his home cardura down from 8mg  to 4mg  to allow more room for rate control. His rate improved on metoprolol 50mg  BID.  -- Continue apixaban 5mg  BID -- He has been on amiodarone in the past. It was stopped after he fell, hit his head and had an intracerebral bleed. He now has a pacer and could be tried on amio again  in the future if needed.   TSH- mildly elevated at 4.57. Will increase his levothyroxine from 112 mcg to 198mcg. He will need follow up with his PCP.  Left Knee Pain with effusion- Seen by ortho yesterday who performed an aspiration and steroid injection. He is feeling much better  CKD- creat 1.3 ( it has been stable/elevated this admission) Previously 1.10 (06/2014). This is likely his new baseline  Hypokalemia- 3.2 today. Will supplement. BMET in 1 week.    The patient has had an uncomplicated hospital course and is recovering well. He has been seen by Dr. Acie Fredrickson today and deemed ready for discharge home. All follow-up appointments have been scheduled. Discharge medications are listed below.    Discharge Vitals: Blood pressure 117/61, pulse 94, temperature 98.4 F (36.9 C), temperature source Oral, resp. rate 18, height 5\' 9"  (1.753 m), weight 219 lb 6.4 oz (99.519 kg), SpO2 99 %.  Labs: Lab Results  Component Value Date   WBC 8.6 01/03/2015   HGB 13.9 01/03/2015   HCT 41.9 01/03/2015   MCV 93.5 01/03/2015   PLT 138* 01/03/2015    Recent Labs Lab 01/03/15 2103  01/08/15 1125  NA  --   < > 138  K  --   < > 3.2*  CL  --   < > 96  CO2  --   < > 31  BUN  --   < > 26*  CREATININE  --   < > 1.34  CALCIUM  --   < > 9.1  PROT 5.8*  --   --   BILITOT 1.4*  --   --   ALKPHOS 46  --   --   ALT 17  --   --   AST 17  --   --   GLUCOSE  --   < > 102*  < > = values in this interval not displayed. No results for input(s): CKTOTAL, CKMB, TROPONINI in the last 72 hours. No results found for: CHOL, HDL, LDLCALC, TRIG No results found for: DDIMER  Diagnostic Studies/Procedures   Dg Chest 2 View  01/05/2015   CLINICAL DATA:  Dry cough for 8 days, congestive heart failure  EXAM: CHEST  2 VIEW  COMPARISON:  01/03/2015  FINDINGS: Cardiomegaly again noted. No acute infiltrate or pleural effusion. No pulmonary edema. Dual lead cardiac pacemaker is unchanged in position. Osteopenia and  mild degenerative changes thoracic spine.  IMPRESSION: No active cardiopulmonary disease.   Electronically Signed   By: Lahoma Crocker M.D.   On: 01/05/2015 16:56   Dg Knee 1-2 Views Left  01/07/2015   CLINICAL DATA:  Knee pain and swelling.  EXAM: LEFT KNEE - 1-2 VIEW  COMPARISON:  None.  FINDINGS: Left knee is located. Mild degenerative changes are evident. Chondrocalcinosis is present in the menisci. A moderate-sized joint effusion is present. There is calcification along the patellar tendon just above the patella. No acute fracture is evident.  IMPRESSION: 1. Moderate-sized joint effusion without acute fracture. Ligamentous injury is suspected. 2. Mild tricompartmental degenerative changes. 3. Chondrocalcinosis of the menisci.   Electronically Signed   By: San Morelle M.D.   On: 01/07/2015 15:56   Dg Chest Portable 1 View  01/03/2015   CLINICAL DATA:  Worsening shortness of breath for 8 days. History of hypertension, pacemaker.  EXAM: PORTABLE CHEST - 1 VIEW  COMPARISON:  CT of the chest July 18, 2014  FINDINGS: The cardiac silhouette appears moderately enlarged. Mediastinal silhouette is nonsuspicious. Mild pulmonary vascular congestion. Dual lead LEFT cardiac pacemaker in situ. No pleural effusions or focal consolidation. No pneumothorax. ACDF. Resected distal RIGHT clavicle. Soft tissue planes are nonsuspicious.  IMPRESSION: Cardiomegaly and mild pulmonary vascular congestion.   Electronically Signed   By: Elon Alas   On: 01/03/2015 23:23    Discharge Medications     Medication List    STOP taking these medications        dextromethorphan 30 MG/5ML liquid  Commonly known as:  DELSYM     diltiazem 180 MG 24 hr capsule  Commonly known as:  CARDIZEM CD      TAKE these medications        allopurinol 300 MG tablet  Commonly known as:  ZYLOPRIM  Take 300 mg by mouth at bedtime.     apixaban 5 MG Tabs tablet  Commonly known as:  ELIQUIS  Take 1 tablet (5 mg total) by  mouth 2 (two) times daily.     benzonatate 100 MG capsule  Commonly known as:  TESSALON  Take 1 capsule (100 mg total) by mouth 3 (three) times daily as needed for cough.     CALTRATE 600 PO  Take 600 mg by mouth at bedtime.     cholecalciferol 1000 UNITS tablet  Commonly known as:  VITAMIN D  Take 1,000 Units by mouth at bedtime.     colchicine 0.6 MG tablet  Take 0.6 mg by mouth daily as needed (gout).     cyanocobalamin 500 MCG tablet  Take 500 mcg by mouth daily.     doxazosin 4 MG tablet  Commonly known as:  CARDURA  Take 1 tablet (4 mg total) by mouth at bedtime.     furosemide 40 MG tablet  Commonly known as:  LASIX  Take 1 tablet (40 mg total) by mouth daily.     guaiFENesin 100 MG/5ML Soln  Commonly known as:  ROBITUSSIN  Take 10 mLs by mouth every 4 (four) hours as needed for cough or to loosen phlegm.     guaiFENesin-codeine 100-10 MG/5ML syrup  Commonly known as:  ROBITUSSIN AC  Take 5 mLs by mouth 3 (three) times daily as needed for cough.     latanoprost 0.005 % ophthalmic solution  Commonly known as:  XALATAN  Place 1 drop into both eyes at bedtime.     levothyroxine 112 MCG tablet  Commonly known as:  SYNTHROID, LEVOTHROID  Take 1 tablet (112 mcg total) by mouth daily before breakfast.     loratadine 10 MG tablet  Commonly known as:  CLARITIN  Take 5 mg by mouth 2 (two) times daily as needed for allergies or rhinitis.     metoprolol 50 MG tablet  Commonly known as:  LOPRESSOR  Take 1 tablet (50 mg total) by mouth 2 (two) times daily.     OVER THE COUNTER MEDICATION  Take 1 tablet by mouth daily. muscudine seed     potassium  chloride 10 MEQ tablet  Commonly known as:  K-DUR,KLOR-CON  Take 1 tablet (10 mEq total) by mouth daily.     predniSONE 10 MG tablet  Commonly known as:  DELTASONE  Take 10 mg by mouth daily as needed (gout flare up).     Saw Palmetto Caps  Take 1 capsule by mouth daily.     vitamin C 500 MG tablet  Commonly known  as:  ASCORBIC ACID  Take 500 mg by mouth daily.        Disposition   The patient will be discharged in stable condition to home.  Follow-up Information    Follow up with Lyda Jester, PA-C On 01/17/2015.   Specialty:  Cardiology   Why:  @ 8am   Contact information:   [address] [place] [address] [phone]       Follow up with [facility] On 01/14/2015.   Why:  To get lab work from 7:30am - 4:30pm   Contact information:   [address] [place] [address] [phone]      Follow up with Henrine Screws, MD.   Specialty:  Internal Medicine   Why:  Please follow up with your PCP. Your thyroid medication was increased during your admission and your PCP will need to follow this.   Contact information:   [address] [place] [address] [phone]         Duration of Discharge Encounter: Greater than 30 minutes including physician and PA time.  Mable Fill R PA-C 01/08/2015, 3:48 PM   Attending Note:   The patient was seen and examined.  Agree with assessment and plan as noted above.  Changes made to the above note as needed.  See my note from earlier the same day  Ramond Dial., MD, [facility] 01/09/2015, 6:07 AM [address],  [place] Pager [phone]

## 2015-01-08 NOTE — Telephone Encounter (Signed)
New message    tcm appt with [place] S on  4.21.2016 per [name] T PA.

## 2015-01-09 NOTE — Telephone Encounter (Signed)
Patient contacted regarding discharge from [facility] on 01/08/15.  Patient understands to follow up with Lyda Jester, PA on 01/17/15  at 8:00 a.m. at [facility] office.  Patient understands discharge instructions?  Yes  Patient understands medications and regiment?  Yes  Patient understands to bring all medications to this visit?  Yes  Other details: Rescheduled lab work for same day as PA post hosp visit.  Patient lives a good distance away and this would be more convenient for patient.  Informed them labs were to follow up on hypokalemia and new medication Lasix.  Patient's wife (whom I was speaking with, after he handed her the phone) is appreciative for helping with this.

## 2015-01-10 ENCOUNTER — Ambulatory Visit (INDEPENDENT_AMBULATORY_CARE_PROVIDER_SITE_OTHER): Payer: Medicare Other | Admitting: *Deleted

## 2015-01-10 DIAGNOSIS — R001 Bradycardia, unspecified: Secondary | ICD-10-CM

## 2015-01-10 LAB — MDC_IDC_ENUM_SESS_TYPE_REMOTE
Battery Remaining Longevity: 68 mo
Battery Voltage: 3 V
Brady Statistic AP VP Percent: 0.14 %
Brady Statistic AS VP Percent: 18 %
Brady Statistic RA Percent Paced: 0.16 %
Date Time Interrogation Session: 20160414115054
Lead Channel Impedance Value: 361 Ohm
Lead Channel Impedance Value: 456 Ohm
Lead Channel Pacing Threshold Amplitude: 0.75 V
Lead Channel Pacing Threshold Pulse Width: 0.4 ms
Lead Channel Sensing Intrinsic Amplitude: 11.125 mV
Lead Channel Setting Pacing Amplitude: 2 V
Lead Channel Setting Pacing Amplitude: 2.5 V
Lead Channel Setting Sensing Sensitivity: 4 mV
MDC IDC MSMT LEADCHNL RA IMPEDANCE VALUE: 323 Ohm
MDC IDC MSMT LEADCHNL RA SENSING INTR AMPL: 0.875 mV
MDC IDC MSMT LEADCHNL RV IMPEDANCE VALUE: 513 Ohm
MDC IDC SET LEADCHNL RV PACING PULSEWIDTH: 0.4 ms
MDC IDC STAT BRADY AP VS PERCENT: 0.02 %
MDC IDC STAT BRADY AS VS PERCENT: 81.83 %
MDC IDC STAT BRADY RV PERCENT PACED: 18.14 %
Zone Setting Detection Interval: 350 ms
Zone Setting Detection Interval: 400 ms

## 2015-01-10 NOTE — Progress Notes (Signed)
Remote pacemaker transmission.   

## 2015-01-11 ENCOUNTER — Ambulatory Visit (INDEPENDENT_AMBULATORY_CARE_PROVIDER_SITE_OTHER): Payer: Medicare Other | Admitting: Internal Medicine

## 2015-01-11 ENCOUNTER — Encounter: Payer: Self-pay | Admitting: Internal Medicine

## 2015-01-11 ENCOUNTER — Telehealth: Payer: Self-pay | Admitting: Interventional Cardiology

## 2015-01-11 ENCOUNTER — Ambulatory Visit: Payer: Medicare Other | Admitting: Cardiology

## 2015-01-11 VITALS — BP 118/62 | HR 82 | Ht 69.0 in | Wt 221.8 lb

## 2015-01-11 DIAGNOSIS — R0602 Shortness of breath: Secondary | ICD-10-CM

## 2015-01-11 LAB — BASIC METABOLIC PANEL
BUN: 39 mg/dL — ABNORMAL HIGH (ref 6–23)
CALCIUM: 9.3 mg/dL (ref 8.4–10.5)
CO2: 34 meq/L — AB (ref 19–32)
Chloride: 99 mEq/L (ref 96–112)
Creatinine, Ser: 1.27 mg/dL (ref 0.40–1.50)
GFR: 58.53 mL/min — AB (ref >60.00)
Glucose, Bld: 92 mg/dL (ref 70–99)
POTASSIUM: 3.9 meq/L (ref 3.5–5.1)
SODIUM: 138 meq/L (ref 135–145)

## 2015-01-11 LAB — BRAIN NATRIURETIC PEPTIDE: Pro B Natriuretic peptide (BNP): 383 pg/mL — ABNORMAL HIGH (ref 0.0–100.0)

## 2015-01-11 MED ORDER — METOPROLOL TARTRATE 50 MG PO TABS: ORAL_TABLET | ORAL | Status: DC

## 2015-01-11 NOTE — Telephone Encounter (Signed)
Pt called with c/o of SOB at rest and with exertion.  Talking with pt wife and pt beside her.  Pt recently d/c from hospital on Tuesday and has been feeling fine until yesterday. Yesterday his weight was down by 8 lbs (211 lbs ) and this morning it has increased by 5 lbs (216 lbs). Pt has taken ordered 40 mg of Lasix as recommended.  Pt morning BP 106/71 and while on phone 129/71.  Pt wife stated at one point this morning HR was 120 but since then he has been 70-80's.  Pt has no c/o of swelling in extremities. Spoke with DOD, advised that pt come in for Office visit.  Called pt back with 3:15pm scheduled appt with Dr. Harrington Challenger. Pt agreed.

## 2015-01-11 NOTE — Telephone Encounter (Signed)
Pt c/o Shortness Of Breath: STAT if SOB developed within the last 24 hours or pt is noticeably SOB on the phone  1. Are you currently SOB (can you hear that pt is SOB on the phone)? Yes  2. How long have you been experiencing SOB? 1day  3. Are you SOB when sitting or when up moving around? Both  4. Are you currently experiencing any other symptoms? No

## 2015-01-11 NOTE — Patient Instructions (Signed)
Your physician recommends that you continue on your current medications as directed. Please refer to the Current Medication list given to you today.  Your physician recommends that you return for lab work in: today (BMET, BNP)

## 2015-01-11 NOTE — Progress Notes (Signed)
Cardiology Office Note   Date:  01/11/2015   ID:  [name], DOB 1935-10-15, MRN [id number]  PCP:  Don Screws, MD  Cardiologist:   Dorris Carnes, MD   No chief complaint on file.     History of Present Illness: Don Jacobs is a 79 y.o. male with a history of atrial fibrillation  He is usually followed by Don Jacobs   Echo with LVEF 40 to 45% (3/21/165)  Akinesis of infeiror base.    He was just discharged from the hospital on 4/12  Felt to be due to URI and steroids and afib/RVR  Diuresis 3.9 L  Switched to lasix orally 40 qd at D/C  The patient called in today still complaining of SOB  Felling bad  Wt down 11 lbs day after d/c then up 5# today  He denies CP          Current Outpatient Prescriptions  Medication Sig Dispense Refill  . allopurinol (ZYLOPRIM) 300 MG tablet Take 300 mg by mouth at bedtime.     Marland Kitchen apixaban (ELIQUIS) 5 MG TABS tablet Take 1 tablet (5 mg total) by mouth 2 (two) times daily. 180 tablet 3  . benzonatate (TESSALON) 100 MG capsule Take 1 capsule (100 mg total) by mouth 3 (three) times daily as needed for cough. 90 capsule 1  . Calcium Carbonate (CALTRATE 600 PO) Take 600 mg by mouth at bedtime.     . cholecalciferol (VITAMIN D) 1000 UNITS tablet Take 1,000 Units by mouth at bedtime.    . colchicine 0.6 MG tablet Take 0.6 mg by mouth daily as needed (gout).     . cyanocobalamin 500 MCG tablet Take 500 mcg by mouth daily.    Marland Kitchen doxazosin (CARDURA) 4 MG tablet Take 1 tablet (4 mg total) by mouth at bedtime. 30 tablet 11  . furosemide (LASIX) 40 MG tablet Take 1 tablet (40 mg total) by mouth daily. 30 tablet 11  . guaiFENesin (ROBITUSSIN) 100 MG/5ML SOLN Take 10 mLs by mouth every 4 (four) hours as needed for cough or to loosen phlegm.    Marland Kitchen guaiFENesin-codeine (ROBITUSSIN AC) 100-10 MG/5ML syrup Take 5 mLs by mouth 3 (three) times daily as needed for cough.    . latanoprost (XALATAN) 0.005 % ophthalmic solution Place 1 drop into both eyes at  bedtime.    Marland Kitchen levothyroxine (SYNTHROID, LEVOTHROID) 112 MCG tablet Take 1 tablet (112 mcg total) by mouth daily before breakfast. 30 tablet 3  . loratadine (CLARITIN) 10 MG tablet Take 5 mg by mouth 2 (two) times daily as needed for allergies or rhinitis.     . metoprolol (LOPRESSOR) 50 MG tablet Take 1 tablet (50 mg total) by mouth 2 (two) times daily. 60 tablet 11  . Misc Natural Products (SAW PALMETTO) CAPS Take 1 capsule by mouth daily.    Marland Kitchen OVER THE COUNTER MEDICATION Take 1 tablet by mouth daily. muscudine seed    . potassium chloride (K-DUR,KLOR-CON) 10 MEQ tablet Take 1 tablet (10 mEq total) by mouth daily. 30 tablet 11  . predniSONE (DELTASONE) 10 MG tablet Take 10 mg by mouth daily as needed (gout flare up).     . vitamin C (ASCORBIC ACID) 500 MG tablet Take 500 mg by mouth daily.      No current facility-administered medications for this visit.    Allergies:   Penicillins and Oxycodone   Past Medical History  Diagnosis Date  . Hypertension   . A-fib   .  Sleep apnea   . Gout   . Hypothyroidism   . Allergic rhinitis   . ED (erectile dysfunction)   . BPH (benign prostatic hyperplasia)   . Hearing loss     uses hearing a cyst  . Hx of migraines   . Prostatitis, chronic   . Spondylolisthesis 07/2010  . Seborrheic keratosis   . Aortic aneurysm, thoracic 05/25/2011  . Symptomatic bradycardia     a. s/p PPM   . Chronic atrial fibrillation     a. on Elqiuis  . Chronic systolic CHF (congestive heart failure)     Past Surgical History  Procedure Laterality Date  . Back surgery    . L5 selective nerve root block      Dr Nelva Bush  . Foot surgery Right ~ 2007    "reconstruction" (02/17/2013)  . Shoulder open rotator cuff repair Right 09/2007; 2013  . Repair peroneal tendons ankle Right 10/2003  . Knee arthroscopy Left ~ 2007  . Anterior cervical decomp/discectomy fusion  10/2000    "C4, 5, 6 w/fusion" (02/17/2013)  . Lumbar spine surgery  2012    "bone graft, Dr. [name]"  (02/17/2013)  . Insert / replace / remove pacemaker  02/17/2013  . Cardiac catheterization  2000's    "once" (02/17/2013)  . Cardioversion N/A 04/09/2014    Procedure: CARDIOVERSION;  Surgeon: Lelon Perla, MD;  Location: [facility] ENDOSCOPY;  Service: Cardiovascular;  Laterality: N/A;  . Permanent pacemaker insertion N/A 02/17/2013    Procedure: PERMANENT PACEMAKER INSERTION;  Surgeon: Deboraha Sprang, MD;  Location: [facility] CATH LAB;  Service: Cardiovascular;  Laterality: N/A;     Social History:  The patient  reports that he quit smoking about 33 years ago. His smoking use included Cigarettes. He has a 12 pack-year smoking history. He has never used smokeless tobacco. He reports that he does not drink alcohol or use illicit drugs.   Family History:  The patient's family history includes Heart disease in his father.    ROS:  Please see the history of present illness. All other systems are reviewed and  Negative to the above problem except as noted.    PHYSICAL EXAM: VS:  BP 118/62 mmHg  Pulse 82  Ht 5\' 9"  (1.753 m)  Wt 221 lb 12.8 oz (100.608 kg)  BMI 32.74 kg/m2  SpO2 99%  GEN: Obese 79 yo  in no acute distress HEENT: normal Neck: no JVD, carotid bruits, or masses Cardiac: RRR; no murmurs, rubs, or gallops  Tr  edema  Respiratory:  clear to auscultation bilaterally, normal work of breathing GI: soft, nontender, nondistended, + BS  No hepatomegaly  MS: no deformity Moving all extremities   Skin: warm and dry, no rash Neuro:  Strength and sensation are intact Psych: euthymic mood, full affect   EKG:  EKG is ordered today.   Lipid Panel No results found for: CHOL, TRIG, HDL, CHOLHDL, VLDL, LDLCALC, LDLDIRECT    Wt Readings from Last 3 Encounters:  01/11/15 221 lb 12.8 oz (100.608 kg)  01/08/15 219 lb 6.4 oz (99.519 kg)  12/20/14 233 lb (105.688 kg)      ASSESSMENT AND PLAN:  1.  Chronic systolic CHF  Volume status does not appear that bad  Will check labs QUestion if related  to afib   I have asked him to increase his lopressor to 75 am 50 pm  His HR is up to 120   WIll contact him with labs and lasix dosing   2.  Afib As noted above  May need to increase lopressor further  Has PPM   Follow BP to guide     Signed, Dorris Carnes, MD  01/11/2015 3:33 PM    [facility] [address], [place], [place]  [address] Phone: [phone]; Fax: [phone]

## 2015-01-13 ENCOUNTER — Emergency Department (HOSPITAL_COMMUNITY): Payer: Medicare Other

## 2015-01-13 ENCOUNTER — Inpatient Hospital Stay (HOSPITAL_COMMUNITY)
Admission: EM | Admit: 2015-01-13 | Discharge: 2015-01-18 | DRG: 308 | Disposition: A | Discharge status: Home / Self Care | Payer: Medicare Other | Attending: Cardiology | Admitting: Cardiology

## 2015-01-13 ENCOUNTER — Telehealth: Payer: Self-pay | Admitting: Internal Medicine

## 2015-01-13 ENCOUNTER — Encounter (HOSPITAL_COMMUNITY): Payer: Self-pay | Admitting: Emergency Medicine

## 2015-01-13 ENCOUNTER — Other Ambulatory Visit (HOSPITAL_COMMUNITY): Payer: Self-pay

## 2015-01-13 DIAGNOSIS — Z87891 Personal history of nicotine dependence: Secondary | ICD-10-CM

## 2015-01-13 DIAGNOSIS — I4891 Unspecified atrial fibrillation: Secondary | ICD-10-CM | POA: Diagnosis not present

## 2015-01-13 DIAGNOSIS — Z950 Presence of cardiac pacemaker: Secondary | ICD-10-CM | POA: Diagnosis not present

## 2015-01-13 DIAGNOSIS — I10 Essential (primary) hypertension: Secondary | ICD-10-CM

## 2015-01-13 DIAGNOSIS — G43909 Migraine, unspecified, not intractable, without status migrainosus: Secondary | ICD-10-CM | POA: Diagnosis present

## 2015-01-13 DIAGNOSIS — I429 Cardiomyopathy, unspecified: Secondary | ICD-10-CM | POA: Diagnosis present

## 2015-01-13 DIAGNOSIS — M109 Gout, unspecified: Secondary | ICD-10-CM | POA: Diagnosis present

## 2015-01-13 DIAGNOSIS — Z888 Allergy status to other drugs, medicaments and biological substances status: Secondary | ICD-10-CM | POA: Diagnosis not present

## 2015-01-13 DIAGNOSIS — E039 Hypothyroidism, unspecified: Secondary | ICD-10-CM | POA: Diagnosis present

## 2015-01-13 DIAGNOSIS — N289 Disorder of kidney and ureter, unspecified: Secondary | ICD-10-CM | POA: Diagnosis present

## 2015-01-13 DIAGNOSIS — Z880 Allergy status to penicillin: Secondary | ICD-10-CM

## 2015-01-13 DIAGNOSIS — I482 Chronic atrial fibrillation, unspecified: Secondary | ICD-10-CM | POA: Diagnosis present

## 2015-01-13 DIAGNOSIS — I509 Heart failure, unspecified: Secondary | ICD-10-CM

## 2015-01-13 DIAGNOSIS — N400 Enlarged prostate without lower urinary tract symptoms: Secondary | ICD-10-CM | POA: Diagnosis present

## 2015-01-13 DIAGNOSIS — I481 Persistent atrial fibrillation: Principal | ICD-10-CM | POA: Diagnosis present

## 2015-01-13 DIAGNOSIS — I447 Left bundle-branch block, unspecified: Secondary | ICD-10-CM | POA: Diagnosis not present

## 2015-01-13 DIAGNOSIS — I495 Sick sinus syndrome: Secondary | ICD-10-CM | POA: Diagnosis not present

## 2015-01-13 DIAGNOSIS — R0602 Shortness of breath: Secondary | ICD-10-CM | POA: Diagnosis not present

## 2015-01-13 DIAGNOSIS — G4733 Obstructive sleep apnea (adult) (pediatric): Secondary | ICD-10-CM | POA: Diagnosis present

## 2015-01-13 DIAGNOSIS — I5023 Acute on chronic systolic (congestive) heart failure: Secondary | ICD-10-CM | POA: Diagnosis not present

## 2015-01-13 LAB — URINALYSIS, ROUTINE W REFLEX MICROSCOPIC
BILIRUBIN URINE: NEGATIVE
Glucose, UA: NEGATIVE mg/dL
HGB URINE DIPSTICK: NEGATIVE
Ketones, ur: NEGATIVE mg/dL
Leukocytes, UA: NEGATIVE
Nitrite: NEGATIVE
PH: 7 (ref 5.0–8.0)
Protein, ur: NEGATIVE mg/dL
Specific Gravity, Urine: 1.011 (ref 1.005–1.030)
UROBILINOGEN UA: 0.2 mg/dL (ref 0.0–1.0)

## 2015-01-13 LAB — TROPONIN I: Troponin I: <0.03 ng/mL (ref <0.031)

## 2015-01-13 LAB — CBC WITH DIFFERENTIAL/PLATELET
Basophils Absolute: 0 10*3/uL (ref 0.0–0.1)
Basophils Relative: 0 % (ref 0–1)
EOS ABS: 0.1 10*3/uL (ref 0.0–0.7)
Eosinophils Relative: 1 % (ref 0–5)
HEMATOCRIT: 44.9 % (ref 39.0–52.0)
Hemoglobin: 14.8 g/dL (ref 13.0–17.0)
LYMPHS ABS: 1.7 10*3/uL (ref 0.7–4.0)
LYMPHS PCT: 24 % (ref 12–46)
MCH: 30.3 pg (ref 26.0–34.0)
MCHC: 33 g/dL (ref 30.0–36.0)
MCV: 91.8 fL (ref 78.0–100.0)
MONO ABS: 0.4 10*3/uL (ref 0.1–1.0)
Monocytes Relative: 5 % (ref 3–12)
NEUTROS ABS: 5 10*3/uL (ref 1.7–7.7)
Neutrophils Relative %: 70 % (ref 43–77)
Platelets: 182 10*3/uL (ref 150–400)
RBC: 4.89 MIL/uL (ref 4.22–5.81)
RDW: 14.7 % (ref 11.5–15.5)
WBC: 7.2 10*3/uL (ref 4.0–10.5)

## 2015-01-13 LAB — BASIC METABOLIC PANEL
ANION GAP: 12 (ref 5–15)
BUN: 34 mg/dL — AB (ref 6–23)
CALCIUM: 8.9 mg/dL (ref 8.4–10.5)
CO2: 32 mmol/L (ref 19–32)
Chloride: 98 mmol/L (ref 96–112)
Creatinine, Ser: 1.41 mg/dL — ABNORMAL HIGH (ref 0.50–1.35)
GFR calc Af Amer: 54 mL/min — ABNORMAL LOW (ref >90)
GFR calc non Af Amer: 47 mL/min — ABNORMAL LOW (ref >90)
Glucose, Bld: 101 mg/dL — ABNORMAL HIGH (ref 70–99)
Potassium: 4.2 mmol/L (ref 3.5–5.1)
SODIUM: 142 mmol/L (ref 135–145)

## 2015-01-13 LAB — I-STAT TROPONIN, ED: TROPONIN I, POC: 0.01 ng/mL (ref 0.00–0.08)

## 2015-01-13 LAB — TSH: TSH: 4.015 u[IU]/mL (ref 0.350–4.500)

## 2015-01-13 LAB — PROTIME-INR
INR: 1.4 (ref 0.00–1.49)
Prothrombin Time: 17.3 seconds — ABNORMAL HIGH (ref 11.6–15.2)

## 2015-01-13 LAB — APTT: APTT: 31 s (ref 24–37)

## 2015-01-13 LAB — MAGNESIUM: Magnesium: 2 mg/dL (ref 1.5–2.5)

## 2015-01-13 LAB — BRAIN NATRIURETIC PEPTIDE: B Natriuretic Peptide: 280.7 pg/mL — ABNORMAL HIGH (ref 0.0–100.0)

## 2015-01-13 MED ORDER — METOPROLOL TARTRATE 1 MG/ML IV SOLN
2.5000 mg | Freq: Once | INTRAVENOUS | Status: AC
  Administered 2015-01-13: 2.5 mg via INTRAVENOUS
  Filled 2015-01-13: qty 5

## 2015-01-13 MED ORDER — CYANOCOBALAMIN 500 MCG PO TABS
500.0000 ug | ORAL_TABLET | Freq: Every day | ORAL | Status: DC
  Administered 2015-01-13 – 2015-01-18 (×6): 500 ug via ORAL
  Filled 2015-01-13 (×6): qty 1

## 2015-01-13 MED ORDER — BENZONATATE 100 MG PO CAPS
100.0000 mg | ORAL_CAPSULE | Freq: Three times a day (TID) | ORAL | Status: DC | PRN
  Administered 2015-01-13 – 2015-01-18 (×13): 100 mg via ORAL
  Filled 2015-01-13 (×17): qty 1

## 2015-01-13 MED ORDER — LEVOTHYROXINE SODIUM 112 MCG PO TABS
112.0000 ug | ORAL_TABLET | Freq: Every day | ORAL | Status: DC
  Administered 2015-01-13 – 2015-01-18 (×6): 112 ug via ORAL
  Filled 2015-01-13 (×7): qty 1

## 2015-01-13 MED ORDER — CALCIUM CARBONATE 1500 (600 CA) MG PO TABS: 600.0000 mg | ORAL_TABLET | Freq: Every day | ORAL | Status: DC

## 2015-01-13 MED ORDER — DEXTROSE 5 % IV SOLN
5.0000 mg/h | INTRAVENOUS | Status: DC
  Administered 2015-01-13: 5 mg/h via INTRAVENOUS
  Administered 2015-01-13: 10 mg/h via INTRAVENOUS
  Administered 2015-01-14 – 2015-01-16 (×3): 5 mg/h via INTRAVENOUS
  Filled 2015-01-13 (×6): qty 100

## 2015-01-13 MED ORDER — SODIUM CHLORIDE 0.9 % IV SOLN
250.0000 mL | INTRAVENOUS | Status: DC | PRN
  Administered 2015-01-14: 250 mL via INTRAVENOUS

## 2015-01-13 MED ORDER — POTASSIUM CHLORIDE CRYS ER 10 MEQ PO TBCR
10.0000 meq | EXTENDED_RELEASE_TABLET | Freq: Every day | ORAL | Status: DC
  Administered 2015-01-13 – 2015-01-18 (×6): 10 meq via ORAL
  Filled 2015-01-13 (×6): qty 1

## 2015-01-13 MED ORDER — DOXAZOSIN MESYLATE 2 MG PO TABS
2.0000 mg | ORAL_TABLET | Freq: Every day | ORAL | Status: DC
  Administered 2015-01-13 – 2015-01-17 (×5): 2 mg via ORAL
  Filled 2015-01-13 (×6): qty 1

## 2015-01-13 MED ORDER — APIXABAN 5 MG PO TABS
5.0000 mg | ORAL_TABLET | Freq: Two times a day (BID) | ORAL | Status: DC
  Administered 2015-01-13 – 2015-01-18 (×11): 5 mg via ORAL
  Filled 2015-01-13 (×14): qty 1

## 2015-01-13 MED ORDER — ZOLPIDEM TARTRATE 5 MG PO TABS: 5.0000 mg | ORAL_TABLET | Freq: Every evening | ORAL | Status: DC | PRN

## 2015-01-13 MED ORDER — SODIUM CHLORIDE 0.9 % IJ SOLN: 3.0000 mL | INTRAMUSCULAR | Status: DC | PRN

## 2015-01-13 MED ORDER — FUROSEMIDE 40 MG PO TABS
40.0000 mg | ORAL_TABLET | Freq: Every day | ORAL | Status: DC
  Administered 2015-01-13 – 2015-01-18 (×6): 40 mg via ORAL
  Filled 2015-01-13 (×6): qty 1

## 2015-01-13 MED ORDER — SODIUM CHLORIDE 0.9 % IJ SOLN
3.0000 mL | Freq: Two times a day (BID) | INTRAMUSCULAR | Status: DC
  Administered 2015-01-13 – 2015-01-17 (×4): 3 mL via INTRAVENOUS

## 2015-01-13 MED ORDER — COLCHICINE 0.6 MG PO TABS
0.6000 mg | ORAL_TABLET | Freq: Every day | ORAL | Status: DC | PRN
  Filled 2015-01-13: qty 1

## 2015-01-13 MED ORDER — SODIUM CHLORIDE 0.9 % IV SOLN
Freq: Once | INTRAVENOUS | Status: AC
  Administered 2015-01-13: 05:00:00 via INTRAVENOUS

## 2015-01-13 MED ORDER — ONDANSETRON HCL 4 MG/2ML IJ SOLN: 4.0000 mg | Freq: Four times a day (QID) | INTRAMUSCULAR | Status: DC | PRN

## 2015-01-13 MED ORDER — LATANOPROST 0.005 % OP SOLN
1.0000 [drp] | Freq: Every day | OPHTHALMIC | Status: DC
Start: 2015-01-13 — End: 2015-01-18
  Administered 2015-01-13 – 2015-01-17 (×5): 1 [drp] via OPHTHALMIC
  Filled 2015-01-13: qty 2.5

## 2015-01-13 MED ORDER — VITAMIN C 500 MG PO TABS
500.0000 mg | ORAL_TABLET | Freq: Every day | ORAL | Status: DC
  Administered 2015-01-13 – 2015-01-18 (×6): 500 mg via ORAL
  Filled 2015-01-13 (×6): qty 1

## 2015-01-13 MED ORDER — ALPRAZOLAM 0.25 MG PO TABS: 0.2500 mg | ORAL_TABLET | Freq: Two times a day (BID) | ORAL | Status: DC | PRN

## 2015-01-13 MED ORDER — ACETAMINOPHEN 325 MG PO TABS
650.0000 mg | ORAL_TABLET | ORAL | Status: DC | PRN
  Administered 2015-01-16: 650 mg via ORAL
  Filled 2015-01-13: qty 2

## 2015-01-13 MED ORDER — METOPROLOL TARTRATE 25 MG PO TABS
25.0000 mg | ORAL_TABLET | Freq: Once | ORAL | Status: AC
  Administered 2015-01-13: 25 mg via ORAL
  Filled 2015-01-13: qty 1

## 2015-01-13 MED ORDER — GUAIFENESIN-CODEINE 100-10 MG/5ML PO SOLN: 5.0000 mL | Freq: Four times a day (QID) | ORAL | Status: DC | PRN

## 2015-01-13 MED ORDER — LORATADINE 10 MG PO TABS
5.0000 mg | ORAL_TABLET | Freq: Two times a day (BID) | ORAL | Status: DC | PRN
  Filled 2015-01-13: qty 0.5

## 2015-01-13 MED ORDER — CALCIUM CARBONATE 1250 (500 CA) MG PO TABS
1.0000 | ORAL_TABLET | Freq: Every day | ORAL | Status: DC
  Administered 2015-01-13 – 2015-01-17 (×5): 500 mg via ORAL
  Filled 2015-01-13 (×6): qty 1

## 2015-01-13 MED ORDER — VITAMIN D3 25 MCG (1000 UNIT) PO TABS
1000.0000 [IU] | ORAL_TABLET | Freq: Every day | ORAL | Status: DC
  Administered 2015-01-13 – 2015-01-17 (×5): 1000 [IU] via ORAL
  Filled 2015-01-13 (×6): qty 1

## 2015-01-13 MED ORDER — METOPROLOL TARTRATE 12.5 MG HALF TABLET
12.5000 mg | ORAL_TABLET | Freq: Two times a day (BID) | ORAL | Status: DC
  Administered 2015-01-13 – 2015-01-14 (×3): 12.5 mg via ORAL
  Filled 2015-01-13 (×4): qty 1

## 2015-01-13 MED ORDER — ALLOPURINOL 300 MG PO TABS
300.0000 mg | ORAL_TABLET | Freq: Every day | ORAL | Status: DC
  Administered 2015-01-13 – 2015-01-17 (×5): 300 mg via ORAL
  Filled 2015-01-13 (×6): qty 1

## 2015-01-13 NOTE — ED Notes (Signed)
Pt arrives with [facility], recent admission. Dr. Harrington Challenger PCP, labs on Friday and MD concerned about pt, increased potassium, lasix, and metoprolol today. Extra 20 MG lasix this morning. SHOB when attempting to lay down.

## 2015-01-13 NOTE — ED Notes (Signed)
Cardiology MD at bedside.

## 2015-01-13 NOTE — ED Provider Notes (Addendum)
CSN: [id number]     Arrival date & time 01/13/15  6834 History   None    Chief Complaint  Patient presents with  . Shortness of Breath     (Consider location/radiation/quality/duration/timing/severity/associated sxs/prior Treatment) HPI Comments: PT comes in with cc of dib. Pt staqrted having some DIB Friday night. Cards was notified, and pt took extra metoprolol and lasix dose. Pt took 75 mg metoprolol at night and an extra lasix 20 mg as well. Pt's DIB is worse when laying. He also got dizzy with ambulating. No chest pain. Pt has had no significant net weight gain since discharge on 01/10/15. Pt has hx of afib with RVR, CHF. His dilt was stopped last admission, and metop was started in place. Pt reports getting electric cardioversion in the past without any success.   ROS 10 Systems reviewed and are negative for acute change except as noted in the HPI.     Patient is a 79 y.o. male presenting with shortness of breath. The history is provided by the patient.  Shortness of Breath Associated symptoms: no abdominal pain, no chest pain and no cough     Past Medical History  Diagnosis Date  . Hypertension   . A-fib   . Sleep apnea   . Gout   . Hypothyroidism   . Allergic rhinitis   . ED (erectile dysfunction)   . BPH (benign prostatic hyperplasia)   . Hearing loss     uses hearing a cyst  . Hx of migraines   . Prostatitis, chronic   . Spondylolisthesis 07/2010  . Seborrheic keratosis   . Aortic aneurysm, thoracic 05/25/2011  . Symptomatic bradycardia     a. s/p PPM   . Chronic atrial fibrillation     a. on Elqiuis  . Chronic systolic CHF (congestive heart failure)    Past Surgical History  Procedure Laterality Date  . Back surgery    . L5 selective nerve root block      Dr Nelva Bush  . Foot surgery Right ~ 2007    "reconstruction" (02/17/2013)  . Shoulder open rotator cuff repair Right 09/2007; 2013  . Repair peroneal tendons ankle Right 10/2003  . Knee arthroscopy Left ~  2007  . Anterior cervical decomp/discectomy fusion  10/2000    "C4, 5, 6 w/fusion" (02/17/2013)  . Lumbar spine surgery  2012    "bone graft, Dr. [name]" (02/17/2013)  . Insert / replace / remove pacemaker  02/17/2013  . Cardiac catheterization  2000's    "once" (02/17/2013)  . Cardioversion N/A 04/09/2014    Procedure: CARDIOVERSION;  Surgeon: Lelon Perla, MD;  Location: [facility] ENDOSCOPY;  Service: Cardiovascular;  Laterality: N/A;  . Permanent pacemaker insertion N/A 02/17/2013    Procedure: PERMANENT PACEMAKER INSERTION;  Surgeon: Deboraha Sprang, MD;  Location: [facility] CATH LAB;  Service: Cardiovascular;  Laterality: N/A;   Family History  Problem Relation Age of Onset  . Heart disease Father    History  Substance Use Topics  . Smoking status: Former Smoker -- 1.00 packs/day for 12 years    Types: Cigarettes    Quit date: 03/11/1981  . Smokeless tobacco: Never Used  . Alcohol Use: No    Review of Systems  Constitutional: Negative for activity change and appetite change.  Respiratory: Positive for shortness of breath. Negative for cough.   Cardiovascular: Negative for chest pain.  Gastrointestinal: Negative for abdominal pain.  Genitourinary: Negative for dysuria.  All other systems reviewed and are negative.  Allergies  Penicillins and Oxycodone  Home Medications   Prior to Admission medications   Medication Sig Start Date End Date Taking? Authorizing Provider  allopurinol (ZYLOPRIM) 300 MG tablet Take 300 mg by mouth at bedtime.    Yes Historical Provider, MD  apixaban (ELIQUIS) 5 MG TABS tablet Take 1 tablet (5 mg total) by mouth 2 (two) times daily. 01/25/14  Yes Jettie Booze, MD  benzonatate (TESSALON) 100 MG capsule Take 1 capsule (100 mg total) by mouth 3 (three) times daily as needed for cough. 01/08/15  Yes Eileen Stanford, PA-C  Calcium Carbonate (CALTRATE 600 PO) Take 600 mg by mouth at bedtime.    Yes Historical Provider, MD  cholecalciferol (VITAMIN D)  1000 UNITS tablet Take 1,000 Units by mouth at bedtime.   Yes Historical Provider, MD  colchicine 0.6 MG tablet Take 0.6 mg by mouth daily as needed (gout).    Yes Historical Provider, MD  cyanocobalamin 500 MCG tablet Take 500 mcg by mouth daily.   Yes Historical Provider, MD  doxazosin (CARDURA) 4 MG tablet Take 1 tablet (4 mg total) by mouth at bedtime. 01/08/15  Yes Eileen Stanford, PA-C  furosemide (LASIX) 40 MG tablet Take 1 tablet (40 mg total) by mouth daily. 01/08/15  Yes Eileen Stanford, PA-C  guaiFENesin (ROBITUSSIN) 100 MG/5ML SOLN Take 10 mLs by mouth every 4 (four) hours as needed for cough or to loosen phlegm.   Yes Historical Provider, MD  guaiFENesin-codeine (ROBITUSSIN AC) 100-10 MG/5ML syrup Take 5 mLs by mouth 3 (three) times daily as needed for cough.   Yes Historical Provider, MD  latanoprost (XALATAN) 0.005 % ophthalmic solution Place 1 drop into both eyes at bedtime.   Yes Historical Provider, MD  levothyroxine (SYNTHROID, LEVOTHROID) 112 MCG tablet Take 1 tablet (112 mcg total) by mouth daily before breakfast. 01/08/15  Yes Eileen Stanford, PA-C  loratadine (CLARITIN) 10 MG tablet Take 5 mg by mouth 2 (two) times daily as needed for allergies or rhinitis.    Yes Historical Provider, MD  metoprolol (LOPRESSOR) 50 MG tablet TAKE 75 MG (1 AND HALF TABS) IN AM AND 50 MG (1 TAB) IN PM Patient taking differently: Take 75 mg by mouth 2 (two) times daily.  01/11/15  Yes Fay Records, MD  Misc Natural Products (SAW PALMETTO) CAPS Take 1 capsule by mouth daily.   Yes Historical Provider, MD  OVER THE COUNTER MEDICATION Take 1 tablet by mouth daily. muscudine seed   Yes Historical Provider, MD  potassium chloride (K-DUR,KLOR-CON) 10 MEQ tablet Take 1 tablet (10 mEq total) by mouth daily. 01/08/15  Yes Eileen Stanford, PA-C  predniSONE (DELTASONE) 10 MG tablet Take 10 mg by mouth daily as needed (gout flare up).    Yes Historical Provider, MD  vitamin C (ASCORBIC ACID) 500 MG  tablet Take 500 mg by mouth daily.    Yes Historical Provider, MD   BP 94/63 mmHg  Pulse 30  Temp(Src) 98.3 F (36.8 C) (Oral)  Resp 13  Ht 5\' 9"  (1.753 m)  Wt 218 lb (98.884 kg)  BMI 32.18 kg/m2  SpO2 100% Physical Exam  Constitutional: He is oriented to person, place, and time. He appears well-developed.  HENT:  Head: Normocephalic and atraumatic.  Eyes: Conjunctivae and EOM are normal. Pupils are equal, round, and reactive to light.  Neck: Normal range of motion. Neck supple.  Cardiovascular: Normal rate and regular rhythm.   Pulmonary/Chest: Effort normal and breath sounds normal.  Abdominal: Soft. Bowel sounds are normal. He exhibits no distension. There is no tenderness. There is no rebound and no guarding.  Neurological: He is alert and oriented to person, place, and time.  Skin: Skin is warm.  Nursing note and vitals reviewed.   ED Course  Procedures (including critical care time) Labs Review Labs Reviewed  PROTIME-INR - Abnormal; Notable for the following:    Prothrombin Time 17.3 (*)    All other components within normal limits  BRAIN NATRIURETIC PEPTIDE - Abnormal; Notable for the following:    B Natriuretic Peptide 280.7 (*)    All other components within normal limits  BASIC METABOLIC PANEL - Abnormal; Notable for the following:    Glucose, Bld 101 (*)    BUN 34 (*)    Creatinine, Ser 1.41 (*)    GFR calc non Af Amer 47 (*)    GFR calc Af Amer 54 (*)    All other components within normal limits  CBC WITH DIFFERENTIAL/PLATELET  APTT  URINALYSIS, ROUTINE W REFLEX MICROSCOPIC  MAGNESIUM  TROPONIN I    Imaging Review Dg Chest Port 1 View  01/13/2015   CLINICAL DATA:  Shortness of breath  EXAM: PORTABLE CHEST - 1 VIEW  COMPARISON:  01/05/2015  FINDINGS: Stable appearance of dual-chamber pacer leads from the left. There is chronic cardiopericardial enlargement, accentuated by low lung volumes. Unchanged aortic and hilar contours.  Low lung volumes  interstitial crowding. No definitive pneumonia or edema. No effusion or pneumothorax.  IMPRESSION: Limited low volume chest.  No edema or definitive pneumonia.   Electronically Signed   By: Monte Fantasia M.D.   On: 01/13/2015 04:58     EKG Interpretation   Date/Time:  Sunday January 13 2015 03:52:09 EDT Ventricular Rate:  145 PR Interval:    QRS Duration: 143 QT Interval:  343 QTC Calculation: 533 R Axis:   -55 Text Interpretation:  Atrial fibrillation with RVR Left axis deviation  Left bundle branch block RVR new, no significnat changes otherwise  Confirmed by Kathrynn Humble, MD, Thelma Comp [phone]) on 01/13/2015 6:12:20 AM       @5  am - Cardiology team consulted. Recs pending. Pt is now s/p 2 x 2.5 mg ivp, HR improved from 150s to 110-130s. BP is stable.  @7  am - Pt given oral metoprolol and his HR is much improved. Cards team to see him soon. HR in the 90s during my last evaluation.  MDM   Final diagnoses:  Atrial fibrillation with RVR    Pt comes in with cc of dib. He has hx of CHF, Afib with RVR, LBBB. Noted to be in RVR at arrival - with normal lung exam.  Physical exam not suggestive of overt CHF exacerbation either. Pt has tried increased doses of metoprolol and lasix - with no success.  Given his recent changes - will not start diltiazem, and instead give him iv metoprolol. His NP is 100s SBP, so will only give 2.5 mg. Will start him on a slow 125 cc/hr gtt.   CRITICAL CARE Performed by: Varney Biles   Total critical care time: 33 minutes - afib with RVR requiring iv metoprolol x 2 doses  Critical care time was exclusive of separately billable procedures and treating other patients.  Critical care was necessary to treat or prevent imminent or life-threatening deterioration.  Critical care was time spent personally by me on the following activities: development of treatment plan with patient and/or surrogate as well as nursing, discussions with consultants, evaluation  of patient's response to treatment, examination of patient, obtaining history from patient or surrogate, ordering and performing treatments and interventions, ordering and review of laboratory studies, ordering and review of radiographic studies, pulse oximetry and re-evaluation of patient's condition.    Varney Biles, MD 01/13/15 1062  Varney Biles, MD 01/13/15 [phone]

## 2015-01-13 NOTE — H&P (Signed)
Don Jacobs is an 79 y.o. male.    Primary Cardiologist:  Dr. Irish Jacobs  PCP:  Don Screws, MD  Chief Complaint: shortness of breath. HPI: 38M HTN, persistent AF on apixaban, hypothyroidism, bradycardia s/p PPM, newly diagnosed LBBB (12/05/14) who presents back to ER after recent hospitalization  01/03/15-01/08/15 for new HF with A fib RVR. He continued in a fib at discharge and his rate was difficult to contro l, meds were adjusted.   Today presenting with increasing shortness of breath and no chest pain.  He was seen Friday by Dr. Harrington Jacobs with shortness of breath and his HR in a fib was 120.  His weight initially down 11 lbs, then up 5 lbs.His lopressor was increased.  He still had SOB so he took an extra 20 mg lasix.   Last 2 nights he has not been able to sleep.  Has had to sit in recliner, propping up on pillows not helping.  They have decreased salt intake, taking out all salty foods.     CHADSVASC = 4 (CHF, HTN, Age x 2)--ON APIXABAN.   Unclear of AF cause HF exacerbation or vice versa.  He has intolerance to amio and flecainide per pt.    Past hx of underwent a low risk nuclear study on 12/17/14 for fullness in chest and SOB.   A TTE on the same day demonstrated EF 40-45%. The EF was decreased from 55% to 60% on 01/2014. PREVIOUS CATH 2010 WITH NO HEMODYNAMICALLY SIGNIFICANT CAD.  NORMAL LV FUNCTION.   Past Medical History  Diagnosis Date  . Hypertension   . A-fib   . Sleep apnea   . Gout   . Hypothyroidism   . Allergic rhinitis   . ED (erectile dysfunction)   . BPH (benign prostatic hyperplasia)   . Hearing loss     uses hearing a cyst  . Hx of migraines   . Prostatitis, chronic   . Spondylolisthesis 07/2010  . Seborrheic keratosis   . Aortic aneurysm, thoracic 05/25/2011  . Symptomatic bradycardia     a. s/p PPM   . Chronic atrial fibrillation     a. on Don Jacobs  . Chronic systolic CHF (congestive heart failure)     Past Surgical History  Procedure  Laterality Date  . Back surgery    . L5 selective nerve root block      Dr Don Jacobs  . Foot surgery Right ~ 2007    "reconstruction" (02/17/2013)  . Shoulder open rotator cuff repair Right 09/2007; 2013  . Repair peroneal tendons ankle Right 10/2003  . Knee arthroscopy Left ~ 2007  . Anterior cervical decomp/discectomy fusion  10/2000    "C4, 5, 6 w/fusion" (02/17/2013)  . Lumbar spine surgery  2012    "bone graft, Dr. Sherwood Jacobs" (02/17/2013)  . Insert / replace / remove pacemaker  02/17/2013  . Cardiac catheterization  2000's    "once" (02/17/2013)  . Cardioversion N/A 04/09/2014    Procedure: CARDIOVERSION;  Surgeon: Lelon Perla, MD;  Location: [facility] ENDOSCOPY;  Service: Cardiovascular;  Laterality: N/A;  . Permanent pacemaker insertion N/A 02/17/2013    Procedure: PERMANENT PACEMAKER INSERTION;  Surgeon: Deboraha Sprang, MD;  Location: [facility] CATH LAB;  Service: Cardiovascular;  Laterality: N/A;    Family History  Problem Relation Age of Onset  . Heart disease Father    Social History:  reports that he quit smoking about 33 years ago. His smoking use included Cigarettes. He  has a 12 pack-year smoking history. He has never used smokeless tobacco. He reports that he does not drink alcohol or use illicit drugs.  Allergies:  Allergies  Allergen Reactions  . Penicillins Shortness Of Breath and Rash    Ended up at ER after using  . Oxycodone Other (See Comments)    Altered mental status    OUTPATIENT MEDICATIONS: No current facility-administered medications on file prior to encounter.   Current Outpatient Prescriptions on File Prior to Encounter  Medication Sig Dispense Refill  . allopurinol (ZYLOPRIM) 300 MG tablet Take 300 mg by mouth at bedtime.     Marland Kitchen apixaban (ELIQUIS) 5 MG TABS tablet Take 1 tablet (5 mg total) by mouth 2 (two) times daily. 180 tablet 3  . benzonatate (TESSALON) 100 MG capsule Take 1 capsule (100 mg total) by mouth 3 (three) times daily as needed for cough. 90 capsule 1    . Calcium Carbonate (CALTRATE 600 PO) Take 600 mg by mouth at bedtime.     . cholecalciferol (VITAMIN D) 1000 UNITS tablet Take 1,000 Units by mouth at bedtime.    . colchicine 0.6 MG tablet Take 0.6 mg by mouth daily as needed (gout).     . cyanocobalamin 500 MCG tablet Take 500 mcg by mouth daily.    Marland Kitchen doxazosin (CARDURA) 4 MG tablet Take 1 tablet (4 mg total) by mouth at bedtime. 30 tablet 11  . furosemide (LASIX) 40 MG tablet Take 1 tablet (40 mg total) by mouth daily. 30 tablet 11  . guaiFENesin (ROBITUSSIN) 100 MG/5ML SOLN Take 10 mLs by mouth every 4 (four) hours as needed for cough or to loosen phlegm.    Marland Kitchen guaiFENesin-codeine (ROBITUSSIN AC) 100-10 MG/5ML syrup Take 5 mLs by mouth 3 (three) times daily as needed for cough.    . latanoprost (XALATAN) 0.005 % ophthalmic solution Place 1 drop into both eyes at bedtime.    Marland Kitchen levothyroxine (SYNTHROID, LEVOTHROID) 112 MCG tablet Take 1 tablet (112 mcg total) by mouth daily before breakfast. 30 tablet 3  . loratadine (CLARITIN) 10 MG tablet Take 5 mg by mouth 2 (two) times daily as needed for allergies or rhinitis.     . metoprolol (LOPRESSOR) 50 MG tablet TAKE 75 MG (1 AND HALF TABS) IN AM AND 50 MG (1 TAB) IN PM (Patient taking differently: Take 75 mg by mouth 2 (two) times daily. ) 60 tablet 11  . Misc Natural Products (SAW PALMETTO) CAPS Take 1 capsule by mouth daily.    Marland Kitchen OVER THE COUNTER MEDICATION Take 1 tablet by mouth daily. muscudine seed    . potassium chloride (K-DUR,KLOR-CON) 10 MEQ tablet Take 1 tablet (10 mEq total) by mouth daily. 30 tablet 11  . predniSONE (DELTASONE) 10 MG tablet Take 10 mg by mouth daily as needed (gout flare up).     . vitamin C (ASCORBIC ACID) 500 MG tablet Take 500 mg by mouth daily.        Results for orders placed or performed during the hospital encounter of 01/13/15 (from the past 48 hour(s))  CBC with Differential/Platelet     Status: None   Collection Time: 01/13/15  4:00 AM  Result Value Ref  Range   WBC 7.2 4.0 - 10.5 K/uL   RBC 4.89 4.22 - 5.81 MIL/uL   Hemoglobin 14.8 13.0 - 17.0 g/dL   HCT 44.9 39.0 - 52.0 %   MCV 91.8 78.0 - 100.0 fL   MCH 30.3 26.0 - 34.0 pg  MCHC 33.0 30.0 - 36.0 g/dL   RDW 14.7 11.5 - 15.5 %   Platelets 182 150 - 400 K/uL   Neutrophils Relative % 70 43 - 77 %   Neutro Abs 5.0 1.7 - 7.7 K/uL   Lymphocytes Relative 24 12 - 46 %   Lymphs Abs 1.7 0.7 - 4.0 K/uL   Monocytes Relative 5 3 - 12 %   Monocytes Absolute 0.4 0.1 - 1.0 K/uL   Eosinophils Relative 1 0 - 5 %   Eosinophils Absolute 0.1 0.0 - 0.7 K/uL   Basophils Relative 0 0 - 1 %   Basophils Absolute 0.0 0.0 - 0.1 K/uL  APTT     Status: None   Collection Time: 01/13/15  4:00 AM  Result Value Ref Range   aPTT 31 24 - 37 seconds  Protime-INR     Status: Abnormal   Collection Time: 01/13/15  4:00 AM  Result Value Ref Range   Prothrombin Time 17.3 (H) 11.6 - 15.2 seconds   INR 1.40 0.00 - 1.49  Brain natriuretic peptide     Status: Abnormal   Collection Time: 01/13/15  4:00 AM  Result Value Ref Range   B Natriuretic Peptide 280.7 (H) 0.0 - 100.0 pg/mL  Basic metabolic panel     Status: Abnormal   Collection Time: 01/13/15  5:15 AM  Result Value Ref Range   Sodium 142 135 - 145 mmol/L   Potassium 4.2 3.5 - 5.1 mmol/L   Chloride 98 96 - 112 mmol/L   CO2 32 19 - 32 mmol/L   Glucose, Bld 101 (H) 70 - 99 mg/dL   BUN 34 (H) 6 - 23 mg/dL   Creatinine, Ser 1.41 (H) 0.50 - 1.35 mg/dL   Calcium 8.9 8.4 - 10.5 mg/dL   GFR calc non Af Amer 47 (L) >90 mL/min   GFR calc Af Amer 54 (L) >90 mL/min    Comment: (NOTE) The eGFR has been calculated using the CKD EPI equation. This calculation has not been validated in all clinical situations. eGFR's persistently <90 mL/min signify possible Chronic Kidney Disease.    Anion gap 12 5 - 15  Magnesium     Status: None   Collection Time: 01/13/15  5:15 AM  Result Value Ref Range   Magnesium 2.0 1.5 - 2.5 mg/dL  Troponin I     Status: None    Collection Time: 01/13/15  5:15 AM  Result Value Ref Range   Troponin I <0.03 <0.031 ng/mL    Comment:        NO INDICATION OF MYOCARDIAL INJURY.   Urinalysis, Routine w reflex microscopic     Status: None   Collection Time: 01/13/15  5:33 AM  Result Value Ref Range   Color, Urine YELLOW YELLOW   APPearance CLEAR CLEAR   Specific Gravity, Urine 1.011 1.005 - 1.030   pH 7.0 5.0 - 8.0   Glucose, UA NEGATIVE NEGATIVE mg/dL   Hgb urine dipstick NEGATIVE NEGATIVE   Bilirubin Urine NEGATIVE NEGATIVE   Ketones, ur NEGATIVE NEGATIVE mg/dL   Protein, ur NEGATIVE NEGATIVE mg/dL   Urobilinogen, UA 0.2 0.0 - 1.0 mg/dL   Nitrite NEGATIVE NEGATIVE   Leukocytes, UA NEGATIVE NEGATIVE    Comment: MICROSCOPIC NOT DONE ON URINES WITH NEGATIVE PROTEIN, BLOOD, LEUKOCYTES, NITRITE, OR GLUCOSE <1000 mg/dL.   Dg Chest Port 1 View  01/13/2015   CLINICAL DATA:  Shortness of breath  EXAM: PORTABLE CHEST - 1 VIEW  COMPARISON:  01/05/2015  FINDINGS: Stable appearance of dual-chamber pacer leads from the left. There is chronic cardiopericardial enlargement, accentuated by low lung volumes. Unchanged aortic and hilar contours.  Low lung volumes interstitial crowding. No definitive pneumonia or edema. No effusion or pneumothorax.  IMPRESSION: Limited low volume chest.  No edema or definitive pneumonia.   Electronically Signed   By: Monte Fantasia M.D.   On: 01/13/2015 04:58    ROS: General:no colds or fevers, increased weight  Skin:no rashes or ulcers HEENT:no blurred vision, no congestion CV:see HPI PUL:see HPI GI:no diarrhea constipation or melena, no indigestion GU:no hematuria, no dysuria MS:no joint pain, no claudication Neuro:no syncope, + lightheadedness today  Endo:no diabetes, no thyroid disease   Blood pressure 111/57, pulse 57, temperature 98.3 F (36.8 C), temperature source Oral, resp. rate 15, height _0  (1.753 m), weight 218 lb (98.884 kg), SpO2 99 %. PE: General:Pleasant affect,  NAD Skin:Warm and dry, brisk capillary refill HEENT:normocephalic, sclera clear, mucus membranes moist Neck:supple, + JVD, no bruits  Heart:irreg irreg without murmur, gallup, rub or click Lungs:few crackles, no  rhonchi, or wheezes QRF:XJOIT, soft, non tender, + BS, do not palpate liver spleen or masses Ext:1+ lower ext edema at ankles only, 2+ pedal pulses, 2+ radial pulses Neuro:alert and oriented X 3, MAE, follows commands, + facial symmetry    Assessment/Plan Principal Problem:   Atrial fibrillation with RVR- HR 140 on admit with SOB at rest and with exertion.  In past he is known intolerant to amiodarone and flecainide, he was on rythmol and underwent DCCV but did not hold.  It may be time to begin tikosyn, will check labs and have EP see in AM, Dr. Caryl Comes has seen in the past.    Check labs.   Active Problems:   Acute on chronic systolic heart failure BNP 280 down from Friday of 383.  CXR without acute changes.      Obstructive sleep apnea    Hypertension- now hypotensive with meds    Chronic atrial fibrillation- EP to see    SOB (shortness of breath), at rest    [place] Pager [phone] or after 5pm or weekends call [phone] 01/13/2015, 7:23 AM    Patient seen and examined and history reviewed. Agree with above findings and plan. 79 yo WM with AFib and CHF. EF 40%. Recently admitted with CHF exacerbation. Was on diltiazem but switched to metoprolol only. Now returns with weight gain, increased SOB and poor rate control of AFib. History of intolerance to flecainide and amiodarone. Last DCCV in July 2015 on propafenone but did not hold. Clearly his Afib management is not optimal and is exacerbating his CHF. We need to decide on more definitive treatment for his Afib. Will start IV diltiazem for short term. Check and replete electrolytes. Will ask EP to see in am for consideration of Tikosyn versus ablation.  With low BP our options are otherwise limited.   Kiptyn Rafuse [place], [place] 01/13/2015 8:15 AM

## 2015-01-13 NOTE — Telephone Encounter (Signed)
Patient's wife Lenell Antu called stating the patient is quite short of breath. He took extra 20 mg of Lasix today with no significant urine output. Currently patient appears breathless and uncomfortable even seating up in the chair. His heart rate is up to 120 bpm. Blood pressure is around 120/80 millimeters mercury. No chest pain, chills, fevers, nausea, vomiting, diarrhea. Patient was just seen in the office on 01/11/2015 with some recommendations given how to manage A. fib and fluid overload. It appears that the patient is failing outpatient management of his heart failure and it would be safer for him to come into the emergency department for further evaluation. Patient's wife stated that they will be coming to [facility] emergency department for further management.

## 2015-01-14 ENCOUNTER — Encounter (HOSPITAL_COMMUNITY): Payer: Self-pay | Admitting: Cardiology

## 2015-01-14 ENCOUNTER — Other Ambulatory Visit: Payer: Medicare Other

## 2015-01-14 DIAGNOSIS — I447 Left bundle-branch block, unspecified: Secondary | ICD-10-CM

## 2015-01-14 LAB — CBC
HEMATOCRIT: 40.6 % (ref 39.0–52.0)
HEMOGLOBIN: 13.3 g/dL (ref 13.0–17.0)
MCH: 30.5 pg (ref 26.0–34.0)
MCHC: 32.8 g/dL (ref 30.0–36.0)
MCV: 93.1 fL (ref 78.0–100.0)
Platelets: 156 10*3/uL (ref 150–400)
RBC: 4.36 MIL/uL (ref 4.22–5.81)
RDW: 14.5 % (ref 11.5–15.5)
WBC: 5.7 10*3/uL (ref 4.0–10.5)

## 2015-01-14 LAB — BASIC METABOLIC PANEL
ANION GAP: 12 (ref 5–15)
ANION GAP: 11 (ref 5–15)
BUN: 30 mg/dL — ABNORMAL HIGH (ref 6–23)
BUN: 31 mg/dL — ABNORMAL HIGH (ref 6–23)
CHLORIDE: 102 mmol/L (ref 96–112)
CO2: 29 mmol/L (ref 19–32)
CO2: 27 mmol/L (ref 19–32)
CREATININE: 1.32 mg/dL (ref 0.50–1.35)
Calcium: 9.1 mg/dL (ref 8.4–10.5)
Calcium: 9 mg/dL (ref 8.4–10.5)
Chloride: 101 mmol/L (ref 96–112)
Creatinine, Ser: 1.39 mg/dL — ABNORMAL HIGH (ref 0.50–1.35)
GFR calc Af Amer: 59 mL/min — ABNORMAL LOW (ref >90)
GFR calc Af Amer: 55 mL/min — ABNORMAL LOW (ref >90)
GFR calc non Af Amer: 51 mL/min — ABNORMAL LOW (ref >90)
GFR calc non Af Amer: 48 mL/min — ABNORMAL LOW (ref >90)
GLUCOSE: 100 mg/dL — AB (ref 70–99)
Glucose, Bld: 95 mg/dL (ref 70–99)
POTASSIUM: 4 mmol/L (ref 3.5–5.1)
Potassium: 4.1 mmol/L (ref 3.5–5.1)
SODIUM: 139 mmol/L (ref 135–145)
Sodium: 143 mmol/L (ref 135–145)

## 2015-01-14 LAB — MAGNESIUM
MAGNESIUM: 1.9 mg/dL (ref 1.5–2.5)
Magnesium: 1.9 mg/dL (ref 1.5–2.5)

## 2015-01-14 MED ORDER — SODIUM CHLORIDE 0.9 % IV SOLN
250.0000 mL | INTRAVENOUS | Status: DC | PRN
  Administered 2015-01-17: 13:00:00 via INTRAVENOUS

## 2015-01-14 MED ORDER — METOPROLOL TARTRATE 25 MG PO TABS
25.0000 mg | ORAL_TABLET | Freq: Two times a day (BID) | ORAL | Status: DC
  Administered 2015-01-14 – 2015-01-18 (×8): 25 mg via ORAL
  Filled 2015-01-14 (×9): qty 1

## 2015-01-14 MED ORDER — MAGNESIUM SULFATE 2 GM/50ML IV SOLN
2.0000 g | Freq: Once | INTRAVENOUS | Status: AC
  Administered 2015-01-14: 2 g via INTRAVENOUS
  Filled 2015-01-14: qty 50

## 2015-01-14 MED ORDER — DOFETILIDE 250 MCG PO CAPS
250.0000 ug | ORAL_CAPSULE | Freq: Two times a day (BID) | ORAL | Status: DC
  Administered 2015-01-15 – 2015-01-18 (×7): 250 ug via ORAL
  Filled 2015-01-14 (×10): qty 1

## 2015-01-14 MED ORDER — SODIUM CHLORIDE 0.9 % IJ SOLN: 3.0000 mL | INTRAMUSCULAR | Status: DC | PRN

## 2015-01-14 MED ORDER — SODIUM CHLORIDE 0.9 % IJ SOLN
3.0000 mL | Freq: Two times a day (BID) | INTRAMUSCULAR | Status: DC
  Administered 2015-01-15: 3 mL via INTRAVENOUS

## 2015-01-14 NOTE — Care Management Note (Signed)
    Page 1 of 1   01/18/2015     4:58:58 PM CARE MANAGEMENT NOTE 01/18/2015  Patient:  Don Jacobs, Don Jacobs   Account Number:  [id number]  Date Initiated:  01/14/2015  Documentation initiated by:  Deloris Moger  Subjective/Objective Assessment:   Pt adm on 01/13/15 with Afib, SOB.  PTA, pt independent, lives with spouse.     Action/Plan:   Will follow for dc needs as pt progresses.   Anticipated DC Date:  01/18/2015   Anticipated DC Plan:  [place]  CM consult  Medication Assistance      Choice offered to / List presented to:             Status of service:  Completed, signed off Medicare Important Message given?  YES (If response is "NO", the following Medicare IM given date fields will be blank) Date Medicare IM given:  01/16/2015 Medicare IM given by:  Deaire Mcwhirter Date Additional Medicare IM given:   Additional Medicare IM given by:    Discharge Disposition:  HOME/SELF CARE  Per UR Regulation:  Reviewed for med. necessity/level of care/duration of stay  If discussed at [place] of Stay Meetings, dates discussed:    Comments:  01/18/15 Ellan Lambert, RN, BSN [phone] Left message for [facility] in [place] to discuss authorization for Tikosyn for pt.  Faxed Tikosyn Rx to her at [phone].  Pt given week's supply of med upon dc. Pt's only Rx coverage is [employer].   He plans to purchase 2 weeks of med at retail pharmacy out of pocket to give [place] enough time to facilitate authorization of Rx. Pt appreciative of all help given.  01-16-15 [address] [place] [phone] CM did call the [place] in ref to [place] to see if they can cover. Primary Physician is MD Bridgett Larsson. CM awaiting call back from the [place] in ref to coverage- April Alexander. CM also called the [place] for assistance. No answer. CM will continue ot monitor.  01/17/15 Ellan Lambert, RN, BSN [phone] Pt for cardioversion today.

## 2015-01-14 NOTE — Progress Notes (Signed)
QTc 574ms per EKG tonight, HR has been 80-100 on the monitor at rest, Afib. When OOB ambulating in room HR up to 130-140. Pt on cardizem drip currently 5mg /hr. BP 114/82. MD on call paged with cardiology about QTc regarding Tikosyn dose tonight.   Per Dr. Clayborne Artist, hold Tikosyn dose tonight and re-assess in AM.

## 2015-01-14 NOTE — Progress Notes (Signed)
Subjective: SOB with activity  No CP   Objective: Filed Vitals:   01/13/15 1500 01/13/15 2133 01/14/15 0151 01/14/15 0554  BP: 92/75 100/77 90/49 93/58   Pulse: 81 81 72 61  Temp: 97.7 F (36.5 C) 98.2 F (36.8 C) 98.1 F (36.7 C) 98 F (36.7 C)  TempSrc: Oral Oral Axillary Oral  Resp: 18 18 20 20   Height:      Weight:    216 lb 3.2 oz (98.068 kg)  SpO2: 98% 100% 100% 97%   Weight change: -12.8 oz (-0.363 kg)  Intake/Output Summary (Last 24 hours) at 01/14/15 0801 Last data filed at 01/14/15 0600  Gross per 24 hour  Intake 847.25 ml  Output   1925 ml  Net -1077.75 ml    General: Alert, awake, oriented x3, in no acute distress Neck:  JVP is normal Heart: Irregular rate and rhythm, without murmurs, rubs, gallops.  Lungs: Rales are R base   Exemities:  Tr edema.   Neuro: Grossly intact, nonfocal.   Lab Results: Results for orders placed or performed during the hospital encounter of 01/13/15 (from the past 24 hour(s))  I-stat troponin, ED     Status: None   Collection Time: 01/13/15  8:23 AM  Result Value Ref Range   Troponin i, poc 0.01 0.00 - 0.08 ng/mL   Comment 3          TSH     Status: None   Collection Time: 01/13/15  8:50 AM  Result Value Ref Range   TSH 4.015 0.350 - 4.500 uIU/mL  Troponin I-(serum)     Status: None   Collection Time: 01/13/15  8:50 AM  Result Value Ref Range   Troponin I <0.03 <0.031 ng/mL  Troponin I-(serum)     Status: None   Collection Time: 01/13/15  3:14 PM  Result Value Ref Range   Troponin I <0.03 <0.031 ng/mL  Basic metabolic panel     Status: Abnormal   Collection Time: 01/14/15  3:49 AM  Result Value Ref Range   Sodium 143 135 - 145 mmol/L   Potassium 4.1 3.5 - 5.1 mmol/L   Chloride 102 96 - 112 mmol/L   CO2 29 19 - 32 mmol/L   Glucose, Bld 95 70 - 99 mg/dL   BUN 30 (H) 6 - 23 mg/dL   Creatinine, Ser 1.32 0.50 - 1.35 mg/dL   Calcium 9.1 8.4 - 10.5 mg/dL   GFR calc non Af Amer 51 (L) >90 mL/min   GFR calc Af Amer 59  (L) >90 mL/min   Anion gap 12 5 - 15  CBC     Status: None   Collection Time: 01/14/15  3:49 AM  Result Value Ref Range   WBC 5.7 4.0 - 10.5 K/uL   RBC 4.36 4.22 - 5.81 MIL/uL   Hemoglobin 13.3 13.0 - 17.0 g/dL   HCT 40.6 39.0 - 52.0 %   MCV 93.1 78.0 - 100.0 fL   MCH 30.5 26.0 - 34.0 pg   MCHC 32.8 30.0 - 36.0 g/dL   RDW 14.5 11.5 - 15.5 %   Platelets 156 150 - 400 K/uL  Magnesium     Status: None   Collection Time: 01/14/15  3:49 AM  Result Value Ref Range   Magnesium 1.9 1.5 - 2.5 mg/dL    Studies/Results: Dg Chest Port 1 View  01/13/2015   CLINICAL DATA:  Shortness of breath  EXAM: PORTABLE CHEST - 1 VIEW  COMPARISON:  01/05/2015  FINDINGS: Stable appearance of dual-chamber pacer leads from the left. There is chronic cardiopericardial enlargement, accentuated by low lung volumes. Unchanged aortic and hilar contours.  Low lung volumes interstitial crowding. No definitive pneumonia or edema. No effusion or pneumothorax.  IMPRESSION: Limited low volume chest.  No edema or definitive pneumonia.   Electronically Signed   By: Monte Fantasia M.D.   On: 01/13/2015 04:58    Medications:  Reviewed     @PROBHOSP @  1 CHF    Volume status is up some but not bad  No change in regimen for now.  BP limiting anyway     2  Atrial fib  Rates up and down  EP to see patient    3.  HTN  BP is low    4  LBBB     LOS: 1 day   Dorris Carnes 01/14/2015, 8:01 AM

## 2015-01-14 NOTE — Consult Note (Addendum)
Reason for Consult: PAF symptomatic   Referring Physician: Dr. Martin[place] and Harrington Challenger   PCP:  Henrine Screws, MD  Primary Cardiologist: Dr. Irish Lack  EP: Dr. Harlow Mares is an 79 y.o. male.    Chief Complaint: SOB and a fib with RVR, admitted 01/13/15 .    HPI:   54M HTN, persistent AF on apixaban, hypothyroidism, bradycardia s/p PPM, newly diagnosed LBBB (12/05/14) who presented back to ER after recent hospitalization 01/03/15-01/08/15 for new HF with A fib RVR.  He continued in a fib at discharge (see below for a fib hx.)  and his rate was difficult to control, meds were adjusted. Today presenting with increasing shortness of breath and no chest pain. He was seen Friday by Dr. Harrington Challenger with shortness of breath and his HR in a fib was 120. His weight initially down 11 lbs, then up 5 lbs.His lopressor was increased. He still had SOB so he took an extra 20 mg lasix. Last 2 nights he has not been able to sleep. Has had to sit in recliner, propping up on pillows not helping. They have decreased salt intake, taking out all salty foods.   CHADSVASC = 4 (CHF, HTN, Age x 2)--ON APIXABAN. Unclear of AF cause HF exacerbation or vice versa. He has intolerance to amio and flecainide per pt.   Past hx of underwent a low risk nuclear study on 12/17/14 for fullness in chest and SOB. A TTE on the same day demonstrated EF 40-45%. The EF was decreased from 55% to 60% on 01/2014. PREVIOUS CATH 2010 WITH NO HEMODYNAMICALLY SIGNIFICANT CAD. NORMAL LV FUNCTION.  Pt with a hx PAF dating back to 2012. Initially controlled with metoprolol, then break through that was symptomatic and he was tried with Flecainide and then amiodarone both of these meds complicated by dizziness.  He eventually rec'd PPM (medtronic) for tachy brady.  He began with increased PAF in 08/2013 and became persistent in April 2015 with increase of Dilt he had significant lower ext edema.  Pt placed on propafenone and  then [facility] which did not hold.  He was back in a fib within 24 hours.  Pt stayed in a fib despite increasing propafenone dose without results.  Propafenone stopped 07/04/15 and placed back on Dilt.Marland Kitchen  He continued in a fib at times rate controlled.  Beginning in March he was coughing and had less energy.   Now with 2 admits for SOB and his a fib with RVR.     Past Medical History  Diagnosis Date  . Hypertension   . A-fib   . Sleep apnea   . Gout   . Hypothyroidism   . Allergic rhinitis   . ED (erectile dysfunction)   . BPH (benign prostatic hyperplasia)   . Hearing loss     uses hearing a cyst  . Hx of migraines   . Prostatitis, chronic   . Spondylolisthesis 07/2010  . Seborrheic keratosis   . Aortic aneurysm, thoracic 05/25/2011  . Symptomatic bradycardia     a. s/p PPM   . Chronic atrial fibrillation     a. on Elqiuis  . Chronic systolic CHF (congestive heart failure)     Past Surgical History  Procedure Laterality Date  . Back surgery    . L5 selective nerve root block      Dr Nelva Bush  . Foot surgery Right ~ 2007    "reconstruction" (02/17/2013)  . Shoulder open rotator  cuff repair Right 09/2007; 2013  . Repair peroneal tendons ankle Right 10/2003  . Knee arthroscopy Left ~ 2007  . Anterior cervical decomp/discectomy fusion  10/2000    "C4, 5, 6 w/fusion" (02/17/2013)  . Lumbar spine surgery  2012    "bone graft, Dr. [name]" (02/17/2013)  . Insert / replace / remove pacemaker  02/17/2013  . Cardiac catheterization  2000's    "once" (02/17/2013)  . Cardioversion N/A 04/09/2014    Procedure: CARDIOVERSION;  Surgeon: Lelon Perla, MD;  Location[facility]COPY;  Service: Cardiovascular;  Laterality: N/A;  . Permanent pacemaker insertion N/A 02/17/2013    Procedure: PERMANENT PACEMAKER INSERTION;  Surgeon: Deboraha Sprang, MD;  Location: [facility] CATH LAB;  Service: Cardiovascular;  Laterality: N/A;    Family History  Problem Relation Age of Onset  . Heart disease Father    Social  History:  reports that he quit smoking about 33 years ago. His smoking use included Cigarettes. He has a 12 pack-year smoking history. He has never used smokeless tobacco. He reports that he does not drink alcohol or use illicit drugs.    Meds See MAR      Allergies:  Allergies  Allergen Reactions  . Penicillins Shortness Of Breath and Rash    Ended up at ER after using  . Oxycodone Other (See Comments)    Altered mental status    Results for orders placed or performed during the hospital encounter of 01/13/15 (from the past 48 hour(s))  CBC with Differential/Platelet     Status: None   Collection Time: 01/13/15  4:00 AM  Result Value Ref Range   WBC 7.2 4.0 - 10.5 K/uL   RBC 4.89 4.22 - 5.81 MIL/uL   Hemoglobin 14.8 13.0 - 17.0 g/dL   HCT 44.9 39.0 - 52.0 %   MCV 91.8 78.0 - 100.0 fL   MCH 30.3 26.0 - 34.0 pg   MCHC 33.0 30.0 - 36.0 g/dL   RDW 14.7 11.5 - 15.5 %   Platelets 182 150 - 400 K/uL   Neutrophils Relative % 70 43 - 77 %   Neutro Abs 5.0 1.7 - 7.7 K/uL   Lymphocytes Relative 24 12 - 46 %   Lymphs Abs 1.7 0.7 - 4.0 K/uL   Monocytes Relative 5 3 - 12 %   Monocytes Absolute 0.4 0.1 - 1.0 K/uL   Eosinophils Relative 1 0 - 5 %   Eosinophils Absolute 0.1 0.0 - 0.7 K/uL   Basophils Relative 0 0 - 1 %   Basophils Absolute 0.0 0.0 - 0.1 K/uL  APTT     Status: None   Collection Time: 01/13/15  4:00 AM  Result Value Ref Range   aPTT 31 24 - 37 seconds  Protime-INR     Status: Abnormal   Collection Time: 01/13/15  4:00 AM  Result Value Ref Range   Prothrombin Time 17.3 (H) 11.6 - 15.2 seconds   INR 1.40 0.00 - 1.49  Brain natriuretic peptide     Status: Abnormal   Collection Time: 01/13/15  4:00 AM  Result Value Ref Range   B Natriuretic Peptide 280.7 (H) 0.0 - 100.0 pg/mL  Basic metabolic panel     Status: Abnormal   Collection Time: 01/13/15  5:15 AM  Result Value Ref Range   Sodium 142 135 - 145 mmol/L   Potassium 4.2 3.5 - 5.1 mmol/L   Chloride 98 96 - 112  mmol/L   CO2 32 19 - 32 mmol/L  Glucose, Bld 101 (H) 70 - 99 mg/dL   BUN 34 (H) 6 - 23 mg/dL   Creatinine, Ser 1.41 (H) 0.50 - 1.35 mg/dL   Calcium 8.9 8.4 - 10.5 mg/dL   GFR calc non Af Amer 47 (L) >90 mL/min   GFR calc Af Amer 54 (L) >90 mL/min    Comment: (NOTE) The eGFR has been calculated using the CKD EPI equation. This calculation has not been validated in all clinical situations. eGFR's persistently <90 mL/min signify possible Chronic Kidney Disease.    Anion gap 12 5 - 15  Magnesium     Status: None   Collection Time: 01/13/15  5:15 AM  Result Value Ref Range   Magnesium 2.0 1.5 - 2.5 mg/dL  Troponin I     Status: None   Collection Time: 01/13/15  5:15 AM  Result Value Ref Range   Troponin I <0.03 <0.031 ng/mL    Comment:        NO INDICATION OF MYOCARDIAL INJURY.   Urinalysis, Routine w reflex microscopic     Status: None   Collection Time: 01/13/15  5:33 AM  Result Value Ref Range   Color, Urine YELLOW YELLOW   APPearance CLEAR CLEAR   Specific Gravity, Urine 1.011 1.005 - 1.030   pH 7.0 5.0 - 8.0   Glucose, UA NEGATIVE NEGATIVE mg/dL   Hgb urine dipstick NEGATIVE NEGATIVE   Bilirubin Urine NEGATIVE NEGATIVE   Ketones, ur NEGATIVE NEGATIVE mg/dL   Protein, ur NEGATIVE NEGATIVE mg/dL   Urobilinogen, UA 0.2 0.0 - 1.0 mg/dL   Nitrite NEGATIVE NEGATIVE   Leukocytes, UA NEGATIVE NEGATIVE    Comment: MICROSCOPIC NOT DONE ON URINES WITH NEGATIVE PROTEIN, BLOOD, LEUKOCYTES, NITRITE, OR GLUCOSE <1000 mg/dL.  I-stat troponin, ED     Status: None   Collection Time: 01/13/15  8:23 AM  Result Value Ref Range   Troponin i, poc 0.01 0.00 - 0.08 ng/mL   Comment 3            Comment: Due to the release kinetics of cTnI, a negative result within the first hours of the onset of symptoms does not rule out myocardial infarction with certainty. If myocardial infarction is still suspected, repeat the test at appropriate intervals.   TSH     Status: None   Collection Time:  01/13/15  8:50 AM  Result Value Ref Range   TSH 4.015 0.350 - 4.500 uIU/mL  Troponin I-(serum)     Status: None   Collection Time: 01/13/15  8:50 AM  Result Value Ref Range   Troponin I <0.03 <0.031 ng/mL    Comment:        NO INDICATION OF MYOCARDIAL INJURY.   Troponin I-(serum)     Status: None   Collection Time: 01/13/15  3:14 PM  Result Value Ref Range   Troponin I <0.03 <0.031 ng/mL    Comment:        NO INDICATION OF MYOCARDIAL INJURY.   Basic metabolic panel     Status: Abnormal   Collection Time: 01/14/15  3:49 AM  Result Value Ref Range   Sodium 143 135 - 145 mmol/L   Potassium 4.1 3.5 - 5.1 mmol/L   Chloride 102 96 - 112 mmol/L   CO2 29 19 - 32 mmol/L   Glucose, Bld 95 70 - 99 mg/dL   BUN 30 (H) 6 - 23 mg/dL   Creatinine, Ser 1.32 0.50 - 1.35 mg/dL   Calcium 9.1 8.4 - 10.5 mg/dL  GFR calc non Af Amer 51 (L) >90 mL/min   GFR calc Af Amer 59 (L) >90 mL/min    Comment: (NOTE) The eGFR has been calculated using the CKD EPI equation. This calculation has not been validated in all clinical situations. eGFR's persistently <90 mL/min signify possible Chronic Kidney Disease.    Anion gap 12 5 - 15  CBC     Status: None   Collection Time: 01/14/15  3:49 AM  Result Value Ref Range   WBC 5.7 4.0 - 10.5 K/uL   RBC 4.36 4.22 - 5.81 MIL/uL   Hemoglobin 13.3 13.0 - 17.0 g/dL   HCT 40.6 39.0 - 52.0 %   MCV 93.1 78.0 - 100.0 fL   MCH 30.5 26.0 - 34.0 pg   MCHC 32.8 30.0 - 36.0 g/dL   RDW 14.5 11.5 - 15.5 %   Platelets 156 150 - 400 K/uL  Magnesium     Status: None   Collection Time: 01/14/15  3:49 AM  Result Value Ref Range   Magnesium 1.9 1.5 - 2.5 mg/dL   Dg Chest Port 1 View  01/13/2015   CLINICAL DATA:  Shortness of breath  EXAM: PORTABLE CHEST - 1 VIEW  COMPARISON:  01/05/2015  FINDINGS: Stable appearance of dual-chamber pacer leads from the left. There is chronic cardiopericardial enlargement, accentuated by low lung volumes. Unchanged aortic and hilar  contours.  Low lung volumes interstitial crowding. No definitive pneumonia or edema. No effusion or pneumothorax.  IMPRESSION: Limited low volume chest.  No edema or definitive pneumonia.   Electronically Signed   By: Monte Fantasia M.D.   On: 01/13/2015 04:58    ROS: General:no colds or fevers,  weight decreased from outpt Skin:no rashes or ulcers HEENT:no blurred vision, no congestion CV:see HPI PUL:see HPI GI:no diarrhea constipation or melena, no indigestion GU:no hematuria, no dysuria MS:no joint pain, no claudication Neuro:no syncope, no lightheadedness Endo:no diabetes, no thyroid disease   Blood pressure 95/57, pulse 78, temperature 98.4 F (36.9 C), temperature source Oral, resp. rate 18, height $RemoveBe'5\' 9"'AGFnElXcG$  (1.753 m), weight 216 lb 3.2 oz (98.068 kg), SpO2 99 %.  Wt Readings from Last 3 Encounters:  01/14/15 216 lb 3.2 oz (98.068 kg)  01/11/15 221 lb 12.8 oz (100.608 kg)  01/08/15 219 lb 6.4 oz (99.519 kg)    PE: General:Pleasant affect, NAD Skin:Warm and dry, brisk capillary refill HEENT:normocephalic, sclera clear, mucus membranes moist Neck:supple, no JVD, no bruits  Heart:irreg irreg without murmur, gallup, rub or click Lungs:clear with few rales, rhonchi, or wheezes PJS:RPRXY, soft, non tender, + BS, do not palpate liver spleen or masses Ext:no lower ext edema, 2+ pedal pulses, 2+ radial pulses Neuro:alert and oriented X 3, MAE, follows commands, + facial symmetry Tele:  A fib with RVR up to 160     ECG  LBBB 87  --- /15/49  Assessment/Plan Principal Problem:   Atrial fibrillation with RVR, rate with improved control.   Will check pacer to see a fib rates. K+ 4.1, Mg 1.9 TSH 4.015  Possible for tikosyn- concern for cost recently will ask care manager to assist.  He may be able to obtain from [place].   Dr. Caryl Comes to see and discuss Tikosyn vs. Ablation.  Qtc today 490 ms  On eliquis, intolerant to amiodarone, flecainide, high dose of dilt with lower ext edema, Rythmol +  recurrent a fib. So stopped    Pacer interrogation with 98.8% of time in a fib.  Max HR 188 recently  Active Problems:   Acute on chronic systolic heart failure ( -1077 since admit)   Obstructive sleep apnea with CPAP at night    Hypertension- more hypotension in a fib- difficult to adjust meds Persistent  atrial fibrillation- having  symptoms     LBBB  Rate related LA enlargement moderate   Virl Axe  Nurse Practitioner Certified [place] Pager [phone] or after 5pm or weekends call [phone] 01/14/2015, 3:18 PM  Pt has symptomatic persistent atrial fibrillation with rate related LBBB   It is his impression and that of his wife that he is significantly worse with in afib  There has been intreval worsening of LV function 60>>40-45% which we can presume is 2/2 tachycardia  I agree with assessment above has modified and will  Begin tikosyn Anticipate cardioversion on Wed if not converted And discharge on Thurs  Will need reassessment of LA dimensions if able to maintain sinus so as to inform recommendation as to site for possible AFib ablation  ( LA size 50/2.2/36)   Will increase metoprolol 25 bid

## 2015-01-14 NOTE — Consult Note (Signed)
[facility] CM Inpatient Consult   01/14/2015  Don Jacobs Aug 08, 1936 [id number] Readmission referral received.  Met with the patient and his wife regarding the benefits of [facility] Care Management with his Medicare insurance.  Patient and wife feels confident that there are no current care management needs at this time.  Patient did take the brochure for information regarding [facility] Care Management.  He states, "My wife is a Marine scientist and we have a good set up for right now."  For additional questions, please contact: Natividad Brood, RN BSN [facility] Liaison  [phone] business mobile phone

## 2015-01-14 NOTE — Progress Notes (Signed)
Pharmacy Consult for Dofetilide (Tikosyn) Initiation  Admit Complaint: 79 y.o. male admitted 01/13/2015 with atrial fibrillation to be initiated on dofetilide.   Assessment:  Patient Exclusion Criteria: If any screening criteria checked as "Yes", then  patient  should NOT receive dofetilide until criteria item is corrected. If "Yes" please indicate correction plan.  YES  NO Patient  Exclusion Criteria Correction Plan  [x]  []  Baseline QTc interval is greater than or equal to 440 msec. IF above YES box checked dofetilide contraindicated unless patient has ICD; then may proceed if QTc 500-550 msec or with known ventricular conduction abnormalities may proceed with QTc 550-600 msec. QTc =   See Dr Olin Pia note  []  [x]  Magnesium level is less than 1.8 mEq/l : Last magnesium:  Lab Results  Component Value Date   MG 1.9 01/14/2015         []  [x]  Potassium level is less than 4 mEq/l : Last potassium:  Lab Results  Component Value Date   K 4.1 01/14/2015         []  [x]  Patient is known or suspected to have a digoxin level greater than 2 ng/ml: No results found for: DIGOXIN    []  [x]  Creatinine clearance less than 20 ml/min (calculated using Cockcroft-Gault, actual body weight and serum creatinine): Estimated Creatinine Clearance: 55 mL/min (by C-G formula based on Cr of 1.32).    []  [x]  Patient has received drugs known to prolong the QT intervals within the last 48 hours(phenothiazines, tricyclics or tetracyclic antidepressants, erythromycin, H-1 antihistamines, cisapride, fluoroquinolones, azithromycin). Drugs not listed above may have an, as yet, undetected potential to prolong the QT interval, updated information on QT prolonging agents is available at this website:QT prolonging agents   []  [x]  Patient received a dose of hydrochlorothiazide (Oretic) alone or in any combination including triamterene (Dyazide, Maxzide) in the last 48 hours.   []  [x]  Patient received a medication known to  increase dofetilide plasma concentrations prior to initial dofetilide dose:  . Trimethoprim (Primsol, Proloprim) in the last 36 hours . Verapamil (Calan, Verelan) in the last 36 hours or a sustained release dose in the last 72 hours . Megestrol (Megace) in the last 5 days  . Cimetidine (Tagamet) in the last 6 hours . Ketoconazole (Nizoral) in the last 24 hours . Itraconazole (Sporanox) in the last 48 hours  . Prochlorperazine (Compazine) in the last 36 hours    []  [x]  Patient is known to have a history of torsades de pointes; congenital or acquired long QT syndromes.   []  [x]  Patient has received a Class 1 antiarrhythmic with less than 2 half-lives since last dose. (Disopyramide, Quinidine, Procainamide, Lidocaine, Mexiletine, Flecainide, Propafenone)   []  [x]  Patient has received amiodarone therapy in the past 3 months or amiodarone level is greater than 0.3 ng/ml.    Patient has been appropriately anticoagulated with apixaban.  Ordering provider was confirmed at [email or web address] if they are not listed on the [place] Prescribers list.  Goal of Therapy: Follow renal function, electrolytes, potential drug interactions, and dose adjustment. Provide education and 1 week supply at discharge.  Plan:  []   Physician selected initial dose within range recommended for patients level of renal function - will monitor for response.  [x]   Physician selected initial dose outside of range recommended for patients level of renal function - will discuss if the dose should be altered at this time.   Select One Calculated CrCl  Dose q12h  [x]  > 60 ml/min 500 mcg  []   40-60 ml/min 250 mcg  []   20-40 ml/min 125 mcg   Dose adjusted for QTC  2. Follow up QTc after the first 5 doses, renal function, electrolytes (K & Mg) daily x 3     days, dose adjustment, success of initiation and facilitate 1 week discharge supply as     clinically indicated.  3. Initiate Tikosyn education video (Call [phone]  and ask for video # 116).  4. Place Enrollment Form on the chart for discharge supply of dofetilide.  Eudelia Bunch, Pharm.D. [phone] 01/14/2015 3:51 PM

## 2015-01-15 LAB — MAGNESIUM: Magnesium: 2.2 mg/dL (ref 1.5–2.5)

## 2015-01-15 LAB — BASIC METABOLIC PANEL
Anion gap: 11 (ref 5–15)
BUN: 28 mg/dL — ABNORMAL HIGH (ref 6–23)
CHLORIDE: 102 mmol/L (ref 96–112)
CO2: 28 mmol/L (ref 19–32)
CREATININE: 1.29 mg/dL (ref 0.50–1.35)
Calcium: 9.1 mg/dL (ref 8.4–10.5)
GFR calc Af Amer: 60 mL/min — ABNORMAL LOW (ref >90)
GFR calc non Af Amer: 52 mL/min — ABNORMAL LOW (ref >90)
Glucose, Bld: 96 mg/dL (ref 70–99)
Potassium: 4 mmol/L (ref 3.5–5.1)
Sodium: 141 mmol/L (ref 135–145)

## 2015-01-15 LAB — T4, FREE: FREE T4: 1.77 ng/dL (ref 0.80–1.80)

## 2015-01-15 MED ORDER — SODIUM CHLORIDE 0.9 % IJ SOLN
3.0000 mL | Freq: Two times a day (BID) | INTRAMUSCULAR | Status: DC
  Administered 2015-01-15: 3 mL via INTRAVENOUS

## 2015-01-15 MED ORDER — SODIUM CHLORIDE 0.9 % IJ SOLN: 3.0000 mL | INTRAMUSCULAR | Status: DC | PRN

## 2015-01-15 MED ORDER — SODIUM CHLORIDE 0.9 % IV SOLN
INTRAVENOUS | Status: DC
  Administered 2015-01-16 – 2015-01-17 (×2): via INTRAVENOUS

## 2015-01-15 MED ORDER — SODIUM CHLORIDE 0.9 % IV SOLN: 250.0000 mL | INTRAVENOUS | Status: DC

## 2015-01-15 NOTE — Progress Notes (Signed)
SUBJECTIVE: The patient is doing well today.  At this time, he denies chest pain or any new concerns.  Shortness of breath is stable   Current Medications: . allopurinol  300 mg Oral QHS  . apixaban  5 mg Oral BID  . calcium carbonate  1 tablet Oral QHS  . cholecalciferol  1,000 Units Oral QHS  . cyanocobalamin  500 mcg Oral Daily  . dofetilide  250 mcg Oral BID  . doxazosin  2 mg Oral QHS  . furosemide  40 mg Oral Daily  . latanoprost  1 drop Both Eyes QHS  . levothyroxine  112 mcg Oral QAC breakfast  . metoprolol tartrate  25 mg Oral BID  . potassium chloride  10 mEq Oral Daily  . sodium chloride  3 mL Intravenous Q12H  . sodium chloride  3 mL Intravenous Q12H  . vitamin C  500 mg Oral Daily   . diltiazem (CARDIZEM) infusion 5 mg/hr (01/14/15 2128)    OBJECTIVE: Physical Exam: Filed Vitals:   01/14/15 2126 01/15/15 0120 01/15/15 0302 01/15/15 0539  BP: 116/62 96/67  98/72  Pulse: 101 104 71 75  Temp:  98.3 F (36.8 C)  98 F (36.7 C)  TempSrc:  Oral  Oral  Resp:  18  16  Height:      Weight:    216 lb 14.4 oz (98.385 kg)  SpO2:  98%  98%    Intake/Output Summary (Last 24 hours) at 01/15/15 0728 Last data filed at 01/15/15 0536  Gross per 24 hour  Intake   1490 ml  Output   1500 ml  Net    -10 ml    Telemetry reveals atrial fibrillation with intermittent ventricular pacing  GEN- The patient is well appearing, alert and oriented x 3 today.   Head- normocephalic, atraumatic Eyes-  Sclera clear, conjunctiva pink Ears- hearing intact Oropharynx- clear Neck- supple, no JVP Lungs- Clear to ausculation bilaterally, normal work of breathing Heart- Irregular rate and rhythm, no murmurs, rubs or gallops GI- soft, NT, ND, + BS Extremities- no clubbing, cyanosis, or edema Skin- no rash or lesion Psych- euthymic mood, full affect Neuro- strength and sensation are intact  LABS: Basic Metabolic Panel:  Recent Labs  01/14/15 1657 01/15/15 0523  NA 139 141    K 4.0 4.0  CL 101 102  CO2 27 28  GLUCOSE 100* 96  BUN 31* 28*  CREATININE 1.39* 1.29  CALCIUM 9.0 9.1  MG 1.9 2.2   CBC:  Recent Labs  01/13/15 0400 01/14/15 0349  WBC 7.2 5.7  NEUTROABS 5.0  --   HGB 14.8 13.3  HCT 44.9 40.6  MCV 91.8 93.1  PLT 182 156   Cardiac Enzymes: Thyroid Function Tests:  Recent Labs  01/13/15 0850  TSH 4.015    RADIOLOGY: Dg Chest 2 View 01/05/2015   CLINICAL DATA:  Dry cough for 8 days, congestive heart failure  EXAM: CHEST  2 VIEW  COMPARISON:  01/03/2015  FINDINGS: Cardiomegaly again noted. No acute infiltrate or pleural effusion. No pulmonary edema. Dual lead cardiac pacemaker is unchanged in position. Osteopenia and mild degenerative changes thoracic spine.  IMPRESSION: No active cardiopulmonary disease.   Electronically Signed   By: Lahoma Crocker M.D.   On: 01/05/2015 16:56    ASSESSMENT AND PLAN:  Principal Problem:   Atrial fibrillation with RVR Active Problems:   Obstructive sleep apnea   Hypertension   Acute on chronic systolic heart failure   Chronic  atrial fibrillation   SOB (shortness of breath), at rest   CHF (congestive heart failure), NYHA class I  1.  Persistent atrial fibrillation Previously failed Flecainide and Amiodarone. Tikosyn ordered last night but not given because of concerns about QTc.  Pt has underlying LBBB.  QTc corrected is stable for Tikosyn loading, will start today.  Plan DCCV tomorrow if not spontaneously converted. LA size is 50 by echo 11-2014.  Likelihood of maintaining SR is decreased.   2.  Sinus node dysfunction MDT pacemaker with normal device function Continue Eliquis for CHADS2VASC of 4  3.  HTN Stable No change required today  Chanetta Marshall, NP 01/15/2015 7:53 AM   EP Attending  Patient seen and examined. I agree with the findings as noted above by Chanetta Marshall, NP. The patient has LBBB and QT is long on account of this. Will start Tikosyn and cardiovert if needed tomorrow after the 3rd  dose of Tikosyn.  Mikle Bosworth.D.

## 2015-01-15 NOTE — Progress Notes (Signed)
I cosign all documentation and medication administration by Komicia Jeffries, Student RN. Glendal Cassaday A, RN  

## 2015-01-16 ENCOUNTER — Other Ambulatory Visit: Payer: Self-pay | Admitting: *Deleted

## 2015-01-16 LAB — BASIC METABOLIC PANEL
Anion gap: 9 (ref 5–15)
Anion gap: 11 (ref 5–15)
BUN: 26 mg/dL — ABNORMAL HIGH (ref 6–23)
BUN: 24 mg/dL — AB (ref 6–23)
CALCIUM: 9.1 mg/dL (ref 8.4–10.5)
CHLORIDE: 101 mmol/L (ref 96–112)
CO2: 30 mmol/L (ref 19–32)
CO2: 29 mmol/L (ref 19–32)
CREATININE: 1.4 mg/dL — AB (ref 0.50–1.35)
CREATININE: 1.35 mg/dL (ref 0.50–1.35)
Calcium: 9.2 mg/dL (ref 8.4–10.5)
Chloride: 99 mmol/L (ref 96–112)
GFR calc Af Amer: 55 mL/min — ABNORMAL LOW (ref >90)
GFR calc Af Amer: 57 mL/min — ABNORMAL LOW (ref >90)
GFR calc non Af Amer: 49 mL/min — ABNORMAL LOW (ref >90)
GFR, EST NON AFRICAN AMERICAN: 47 mL/min — AB (ref >90)
GLUCOSE: 92 mg/dL (ref 70–99)
GLUCOSE: 107 mg/dL — AB (ref 70–99)
POTASSIUM: 3.7 mmol/L (ref 3.5–5.1)
POTASSIUM: 3.4 mmol/L — AB (ref 3.5–5.1)
SODIUM: 138 mmol/L (ref 135–145)
Sodium: 141 mmol/L (ref 135–145)

## 2015-01-16 LAB — MAGNESIUM: MAGNESIUM: 1.8 mg/dL (ref 1.5–2.5)

## 2015-01-16 LAB — PROTIME-INR
INR: 1.75 — AB (ref 0.00–1.49)
Prothrombin Time: 20.6 seconds — ABNORMAL HIGH (ref 11.6–15.2)

## 2015-01-16 MED ORDER — SODIUM CHLORIDE 0.9 % IV SOLN: INTRAVENOUS | Status: DC

## 2015-01-16 MED ORDER — POTASSIUM CHLORIDE CRYS ER 20 MEQ PO TBCR
40.0000 meq | EXTENDED_RELEASE_TABLET | Freq: Once | ORAL | Status: AC
  Administered 2015-01-16: 40 meq via ORAL
  Filled 2015-01-16: qty 2

## 2015-01-16 MED ORDER — SODIUM CHLORIDE 0.9 % IJ SOLN
3.0000 mL | Freq: Two times a day (BID) | INTRAMUSCULAR | Status: DC
  Administered 2015-01-18: 3 mL via INTRAVENOUS

## 2015-01-16 MED ORDER — SODIUM CHLORIDE 0.9 % IV SOLN: 250.0000 mL | INTRAVENOUS | Status: DC

## 2015-01-16 MED ORDER — SODIUM CHLORIDE 0.9 % IJ SOLN
3.0000 mL | INTRAMUSCULAR | Status: DC | PRN
  Administered 2015-01-18: 3 mL via INTRAVENOUS
  Filled 2015-01-16: qty 3

## 2015-01-16 NOTE — Progress Notes (Signed)
SUBJECTIVE: The patient is doing well today.  At this time, he denies chest pain or any new concerns.  Shortness of breath is stable   Current Medications: . allopurinol  300 mg Oral QHS  . apixaban  5 mg Oral BID  . calcium carbonate  1 tablet Oral QHS  . cholecalciferol  1,000 Units Oral QHS  . cyanocobalamin  500 mcg Oral Daily  . dofetilide  250 mcg Oral BID  . doxazosin  2 mg Oral QHS  . furosemide  40 mg Oral Daily  . latanoprost  1 drop Both Eyes QHS  . levothyroxine  112 mcg Oral QAC breakfast  . metoprolol tartrate  25 mg Oral BID  . potassium chloride  10 mEq Oral Daily  . sodium chloride  3 mL Intravenous Q12H  . sodium chloride  3 mL Intravenous Q12H  . sodium chloride  3 mL Intravenous Q12H  . vitamin C  500 mg Oral Daily   . sodium chloride    . sodium chloride 50 mL/hr at 01/16/15 0541  . diltiazem (CARDIZEM) infusion 5 mg/hr (01/15/15 1400)    OBJECTIVE: Physical Exam: Filed Vitals:   01/15/15 2038 01/15/15 2106 01/16/15 0251 01/16/15 0543  BP: 101/73  95/60 101/66  Pulse: 85 94 78 81  Temp: 98 F (36.7 C)  98.5 F (36.9 C) 98.1 F (36.7 C)  TempSrc: Oral  Axillary Oral  Resp: 18  16 18   Height:      Weight:      SpO2: 97%  99% 100%    Intake/Output Summary (Last 24 hours) at 01/16/15 0800 Last data filed at 01/16/15 0600  Gross per 24 hour  Intake 1062.67 ml  Output   1300 ml  Net -237.33 ml    Telemetry reveals atrial fibrillation with intermittent ventricular pacing  GEN- The patient is well appearing, alert and oriented x 3 today.   Head- normocephalic, atraumatic Eyes-  Sclera clear, conjunctiva pink Ears- hearing intact Oropharynx- clear Neck- supple, no JVP Lungs- Clear to ausculation bilaterally, normal work of breathing Heart- Irregular rate and rhythm, no murmurs, rubs or gallops GI- soft, NT, ND, + BS Extremities- no clubbing, cyanosis, or edema Skin- no rash or lesion Psych- euthymic mood, full affect Neuro- strength  and sensation are intact  LABS: Basic Metabolic Panel:  Recent Labs  01/15/15 0523 01/16/15 0351  NA 141 138  K 4.0 3.7  CL 102 99  CO2 28 30  GLUCOSE 96 92  BUN 28* 26*  CREATININE 1.29 1.40*  CALCIUM 9.1 9.1  MG 2.2 1.8   CBC:  Recent Labs  01/14/15 0349  WBC 5.7  HGB 13.3  HCT 40.6  MCV 93.1  PLT 156   Cardiac Enzymes: Thyroid Function Tests:  Recent Labs  01/13/15 0850  TSH 4.015    RADIOLOGY: Dg Chest 2 View 01/05/2015   CLINICAL DATA:  Dry cough for 8 days, congestive heart failure  EXAM: CHEST  2 VIEW  COMPARISON:  01/03/2015  FINDINGS: Cardiomegaly again noted. No acute infiltrate or pleural effusion. No pulmonary edema. Dual lead cardiac pacemaker is unchanged in position. Osteopenia and mild degenerative changes thoracic spine.  IMPRESSION: No active cardiopulmonary disease.   Electronically Signed   By: Lahoma Crocker M.D.   On: 01/05/2015 16:56    ASSESSMENT AND PLAN:  Principal Problem:   Atrial fibrillation with RVR Active Problems:   Obstructive sleep apnea   Hypertension   Acute on chronic systolic heart failure  Chronic atrial fibrillation   SOB (shortness of breath), at rest   CHF (congestive heart failure), NYHA class I  1.  Persistent atrial fibrillation Previously failed Flecainide and Amiodarone.  Tikosyn started 01-15-15.   QTc is stable with underlying LBBB.   Plan DCCV today  LA size is 50 by echo 11-2014.  Likelihood of maintaining SR is decreased.   2.  Sinus node dysfunction MDT pacemaker with normal device function Continue Eliquis for CHADS2VASC of 4  3.  HTN Stable No change required today  Chanetta Marshall, NP 01/16/2015 8:00 AM  As dofetilide was delayed 2/2 LBBB erroneously, will defer DCCV until tomorrow to maximize likelyhood of spontaneous reversion as well as increasing drug levels at closer to 5 T 1/2  He voices understanding

## 2015-01-16 NOTE — Progress Notes (Signed)
I cosign all documentation and medication administration by Komicia Jeffries, student RN.  

## 2015-01-17 ENCOUNTER — Other Ambulatory Visit: Payer: Medicare Other

## 2015-01-17 ENCOUNTER — Encounter: Payer: Medicare Other | Admitting: Cardiology

## 2015-01-17 ENCOUNTER — Inpatient Hospital Stay (HOSPITAL_COMMUNITY): Payer: Medicare Other | Admitting: Anesthesiology

## 2015-01-17 ENCOUNTER — Encounter (HOSPITAL_COMMUNITY)
Admission: EM | Disposition: A | Discharge status: Home / Self Care | Payer: Self-pay | Source: Home / Self Care | Attending: Cardiology

## 2015-01-17 ENCOUNTER — Encounter (HOSPITAL_COMMUNITY): Payer: Self-pay | Admitting: *Deleted

## 2015-01-17 DIAGNOSIS — I495 Sick sinus syndrome: Secondary | ICD-10-CM

## 2015-01-17 DIAGNOSIS — I4891 Unspecified atrial fibrillation: Secondary | ICD-10-CM

## 2015-01-17 HISTORY — PX: CARDIOVERSION: SHX1299

## 2015-01-17 LAB — BASIC METABOLIC PANEL
ANION GAP: 8 (ref 5–15)
BUN: 24 mg/dL — ABNORMAL HIGH (ref 6–23)
CALCIUM: 9.1 mg/dL (ref 8.4–10.5)
CHLORIDE: 104 mmol/L (ref 96–112)
CO2: 30 mmol/L (ref 19–32)
CREATININE: 1.44 mg/dL — AB (ref 0.50–1.35)
GFR, EST AFRICAN AMERICAN: 53 mL/min — AB (ref >90)
GFR, EST NON AFRICAN AMERICAN: 46 mL/min — AB (ref >90)
Glucose, Bld: 119 mg/dL — ABNORMAL HIGH (ref 70–99)
Potassium: 4.2 mmol/L (ref 3.5–5.1)
Sodium: 142 mmol/L (ref 135–145)

## 2015-01-17 LAB — MAGNESIUM: Magnesium: 1.7 mg/dL (ref 1.5–2.5)

## 2015-01-17 SURGERY — CARDIOVERSION
Anesthesia: Monitor Anesthesia Care

## 2015-01-17 MED ORDER — PROPOFOL 10 MG/ML IV BOLUS
INTRAVENOUS | Status: DC | PRN
  Administered 2015-01-17: 80 mg via INTRAVENOUS

## 2015-01-17 NOTE — Transfer of Care (Signed)
Immediate Anesthesia Transfer of Care Note  Patient: Don Jacobs  Procedure(s) Performed: Procedure(s): CARDIOVERSION (N/A)  Patient Location: Endoscopy Unit  Anesthesia Type:MAC  Level of Consciousness: awake, alert  and oriented  Airway & Oxygen Therapy: Patient Spontanous Breathing and Patient connected to nasal cannula oxygen  Post-op Assessment: Report given to RN, Post -op Vital signs reviewed and stable and Patient moving all extremities X 4  Post vital signs: Reviewed and stable  Last Vitals:  Filed Vitals:   01/17/15 1315  BP: 113/79  Pulse:   Temp:   Resp:     Complications: No apparent anesthesia complications

## 2015-01-17 NOTE — Anesthesia Postprocedure Evaluation (Signed)
  Anesthesia Post-op Note  Patient: Don Jacobs  Procedure(s) Performed: Procedure(s): CARDIOVERSION (N/A)  Patient Location: Endoscopy Unit  Anesthesia Type:General  Level of Consciousness: awake, alert  and oriented  Airway and Oxygen Therapy: Patient Spontanous Breathing and Patient connected to nasal cannula oxygen  Post-op Pain: none  Post-op Assessment: Post-op Vital signs reviewed, Patient's Cardiovascular Status Stable, Respiratory Function Stable, Patent Airway and Pain level controlled  Post-op Vital Signs: stable  Last Vitals:  Filed Vitals:   01/17/15 1429  BP: 107/65  Pulse: 65  Temp: 36.6 C  Resp: 20    Complications: No apparent anesthesia complications

## 2015-01-17 NOTE — Anesthesia Preprocedure Evaluation (Addendum)
Anesthesia Evaluation  Patient identified by MRN, date of birth, ID band Patient awake    Reviewed: Allergy & Precautions, NPO status , Patient's Chart, lab work & pertinent test results, reviewed documented beta blocker date and time   Airway Mallampati: II  TM Distance: >3 FB Neck ROM: full    Dental  (+) Teeth Intact   Pulmonary shortness of breath and with exertion, sleep apnea and Continuous Positive Airway Pressure Ventilation , former smoker,  breath sounds clear to auscultation        Cardiovascular hypertension, + Peripheral Vascular Disease and +CHF Rhythm:irregular Rate:Normal     Neuro/Psych    GI/Hepatic   Endo/Other  Hypothyroidism   Renal/GU Renal InsufficiencyRenal disease     Musculoskeletal   Abdominal   Peds  Hematology   Anesthesia Other Findings   Reproductive/Obstetrics                            Anesthesia Physical Anesthesia Plan  ASA: III  Anesthesia Plan: General   Post-op Pain Management:    Induction: Intravenous  Airway Management Planned: Mask  Additional Equipment:   Intra-op Plan:   Post-operative Plan:   Informed Consent: I have reviewed the patients History and Physical, chart, labs and discussed the procedure including the risks, benefits and alternatives for the proposed anesthesia with the patient or authorized representative who has indicated his/her understanding and acceptance.   Dental Advisory Given  Plan Discussed with: Anesthesiologist, CRNA and Surgeon  Anesthesia Plan Comments:        Anesthesia Quick Evaluation

## 2015-01-17 NOTE — H&P (View-Only) (Signed)
SUBJECTIVE: The patient is doing well today.  At this time, he denies chest pain or any new concerns.  Shortness of breath is stable  Says feels good  Current Medications: . allopurinol  300 mg Oral QHS  . apixaban  5 mg Oral BID  . calcium carbonate  1 tablet Oral QHS  . cholecalciferol  1,000 Units Oral QHS  . cyanocobalamin  500 mcg Oral Daily  . dofetilide  250 mcg Oral BID  . doxazosin  2 mg Oral QHS  . furosemide  40 mg Oral Daily  . latanoprost  1 drop Both Eyes QHS  . levothyroxine  112 mcg Oral QAC breakfast  . metoprolol tartrate  25 mg Oral BID  . potassium chloride  10 mEq Oral Daily  . sodium chloride  3 mL Intravenous Q12H  . sodium chloride  3 mL Intravenous Q12H  . sodium chloride  3 mL Intravenous Q12H  . sodium chloride  3 mL Intravenous Q12H  . vitamin C  500 mg Oral Daily   . sodium chloride Stopped (01/16/15 1428)  . sodium chloride 50 mL/hr at 01/17/15 0011  . sodium chloride    . sodium chloride    . diltiazem (CARDIZEM) infusion 5 mg/hr (01/16/15 1432)    OBJECTIVE: Physical Exam: Filed Vitals:   01/16/15 2052 01/16/15 2056 01/17/15 0005 01/17/15 0516  BP: 119/77  96/69 90/62  Pulse:  87 54   Temp: 98.4 F (36.9 C)   97.7 F (36.5 C)  TempSrc: Oral   Oral  Resp: 18  18 18   Height:      Weight:    217 lb 4.8 oz (98.567 kg)  SpO2: 98%  98% 98%    Intake/Output Summary (Last 24 hours) at 01/17/15 0300 Last data filed at 01/17/15 0500  Gross per 24 hour  Intake 1607.34 ml  Output   1775 ml  Net -167.66 ml    Telemetry reveals atrial fibrillation with intermittent ventricular pacing and controlled VR   GEN- The patient is well appearing, alert and oriented x 3 today.   Head- normocephalic, atraumatic Eyes-  Sclera clear, conjunctiva pink Ears- hearing intact Oropharynx- clear Neck- supple, no JVP Lungs- Clear to ausculation bilaterally, normal work of breathing Heart- Irregular rate and rhythm, no murmurs, rubs or gallops GI-  soft, NT, ND, + BS Extremities- no clubbing, cyanosis, or edema Skin- no rash or lesion Psych- euthymic mood, full affect Neuro- strength and sensation are intact  LABS: Basic Metabolic Panel:  Recent Labs  01/15/15 0523 01/16/15 0351 01/16/15 1138  NA 141 138 141  K 4.0 3.7 3.4*  CL 102 99 101  CO2 28 30 29   GLUCOSE 96 92 107*  BUN 28* 26* 24*  CREATININE 1.29 1.40* 1.35  CALCIUM 9.1 9.1 9.2  MG 2.2 1.8  --    CBC: No results for input(s): WBC, NEUTROABS, HGB, HCT, MCV, PLT in the last 72 hours. Cardiac Enzymes: Thyroid Function Tests: No results for input(s): TSH, T4TOTAL, T3FREE, THYROIDAB in the last 72 hours.  Invalid input(s): FREET3  RADIOLOGY: Dg Chest 2 View 01/05/2015   CLINICAL DATA:  Dry cough for 8 days, congestive heart failure  EXAM: CHEST  2 VIEW  COMPARISON:  01/03/2015  FINDINGS: Cardiomegaly again noted. No acute infiltrate or pleural effusion. No pulmonary edema. Dual lead cardiac pacemaker is unchanged in position. Osteopenia and mild degenerative changes thoracic spine.  IMPRESSION: No active cardiopulmonary disease.   Electronically Signed   By:  Lahoma Crocker M.D.   On: 01/05/2015 16:56    ASSESSMENT AND PLAN:  Principal Problem:   Atrial fibrillation with RVR Active Problems:   Obstructive sleep apnea   Hypertension   Acute on chronic systolic heart failure   Chronic atrial fibrillation   SOB (shortness of breath), at rest   CHF (congestive heart failure), NYHA class I  1.  Persistent atrial fibrillation Previously failed Flecainide and Amiodarone.  Tikosyn started 01-15-15.   QTc is stable with underlying LBBB.   Plan DCCV today  LA size is 50 by echo 11-2014.  Likelihood of maintaining SR is decreased.   2.  Sinus node dysfunction MDT pacemaker with normal device function Continue Eliquis for CHADS2VASC of 4  3.  HTN now with low BP  d/c dilt 4. Renal insufficiency Gd3  informs dosing of dofetilide    Anticipate discharge inam

## 2015-01-17 NOTE — Interval H&P Note (Signed)
History and Physical Interval Note:  01/17/2015 1:37 PM  Don Jacobs  has presented today for surgery, with the diagnosis of afib  The various methods of treatment have been discussed with the patient and family. After consideration of risks, benefits and other options for treatment, the patient has consented to  Procedure(s): CARDIOVERSION (N/A) as a surgical intervention .  The patient's history has been reviewed, patient examined, no change in status, stable for surgery.  I have reviewed the patient's chart and labs.  Questions were answered to the patient's satisfaction.     Fiana Gladu [employer]

## 2015-01-17 NOTE — Anesthesia Postprocedure Evaluation (Signed)
  Anesthesia Post-op Note  Patient: Don Jacobs  Procedure(s) Performed: Procedure(s): CARDIOVERSION (N/A)  Patient Location: Endoscopy Unit  Anesthesia Type:MAC  Level of Consciousness: awake, alert  and oriented  Airway and Oxygen Therapy: Patient Spontanous Breathing and Patient connected to nasal cannula oxygen  Post-op Pain: none  Post-op Assessment: Post-op Vital signs reviewed, Patient's Cardiovascular Status Stable, Respiratory Function Stable, Patent Airway and No signs of Nausea or vomiting  Post-op Vital Signs: Reviewed and stable  Last Vitals:  Filed Vitals:   01/17/15 1315  BP: 113/79  Pulse:   Temp:   Resp:     Complications: No apparent anesthesia complications

## 2015-01-17 NOTE — Procedures (Signed)
Electrical Cardioversion Procedure Note Don Jacobs [id number] 1936-06-13  Procedure: Electrical Cardioversion Indications:  Atrial Fibrillation  Procedure Details Consent: Risks of procedure as well as the alternatives and risks of each were explained to the (patient/caregiver).  Consent for procedure obtained. Time Out: Verified patient identification, verified procedure, site/side was marked, verified correct patient position, special equipment/implants available, medications/allergies/relevent history reviewed, required imaging and test results available.  Performed  Patient placed on cardiac monitor, pulse oximetry, supplemental oxygen as necessary.  Sedation given: Propofol per anesthesiology Pacer pads placed anterior and posterior chest.  Cardioverted 1 time(s).  Cardioverted at [place].  Evaluation Findings: Post procedure EKG shows: NSR Complications: None Patient did tolerate procedure well.   Don Jacobs 01/17/2015, 1:48 PM

## 2015-01-17 NOTE — Progress Notes (Signed)
SUBJECTIVE: The patient is doing well today.  At this time, he denies chest pain or any new concerns.  Shortness of breath is stable  Says feels good  Current Medications: . allopurinol  300 mg Oral QHS  . apixaban  5 mg Oral BID  . calcium carbonate  1 tablet Oral QHS  . cholecalciferol  1,000 Units Oral QHS  . cyanocobalamin  500 mcg Oral Daily  . dofetilide  250 mcg Oral BID  . doxazosin  2 mg Oral QHS  . furosemide  40 mg Oral Daily  . latanoprost  1 drop Both Eyes QHS  . levothyroxine  112 mcg Oral QAC breakfast  . metoprolol tartrate  25 mg Oral BID  . potassium chloride  10 mEq Oral Daily  . sodium chloride  3 mL Intravenous Q12H  . sodium chloride  3 mL Intravenous Q12H  . sodium chloride  3 mL Intravenous Q12H  . sodium chloride  3 mL Intravenous Q12H  . vitamin C  500 mg Oral Daily   . sodium chloride Stopped (01/16/15 1428)  . sodium chloride 50 mL/hr at 01/17/15 0011  . sodium chloride    . sodium chloride    . diltiazem (CARDIZEM) infusion 5 mg/hr (01/16/15 1432)    OBJECTIVE: Physical Exam: Filed Vitals:   01/16/15 2052 01/16/15 2056 01/17/15 0005 01/17/15 0516  BP: 119/77  96/69 90/62  Pulse:  87 54   Temp: 98.4 F (36.9 C)   97.7 F (36.5 C)  TempSrc: Oral   Oral  Resp: 18  18 18   Height:      Weight:    217 lb 4.8 oz (98.567 kg)  SpO2: 98%  98% 98%    Intake/Output Summary (Last 24 hours) at 01/17/15 9476 Last data filed at 01/17/15 0500  Gross per 24 hour  Intake 1607.34 ml  Output   1775 ml  Net -167.66 ml    Telemetry reveals atrial fibrillation with intermittent ventricular pacing and controlled VR   GEN- The patient is well appearing, alert and oriented x 3 today.   Head- normocephalic, atraumatic Eyes-  Sclera clear, conjunctiva pink Ears- hearing intact Oropharynx- clear Neck- supple, no JVP Lungs- Clear to ausculation bilaterally, normal work of breathing Heart- Irregular rate and rhythm, no murmurs, rubs or gallops GI-  soft, NT, ND, + BS Extremities- no clubbing, cyanosis, or edema Skin- no rash or lesion Psych- euthymic mood, full affect Neuro- strength and sensation are intact  LABS: Basic Metabolic Panel:  Recent Labs  01/15/15 0523 01/16/15 0351 01/16/15 1138  NA 141 138 141  K 4.0 3.7 3.4*  CL 102 99 101  CO2 28 30 29   GLUCOSE 96 92 107*  BUN 28* 26* 24*  CREATININE 1.29 1.40* 1.35  CALCIUM 9.1 9.1 9.2  MG 2.2 1.8  --    CBC: No results for input(s): WBC, NEUTROABS, HGB, HCT, MCV, PLT in the last 72 hours. Cardiac Enzymes: Thyroid Function Tests: No results for input(s): TSH, T4TOTAL, T3FREE, THYROIDAB in the last 72 hours.  Invalid input(s): FREET3  RADIOLOGY: Dg Chest 2 View 01/05/2015   CLINICAL DATA:  Dry cough for 8 days, congestive heart failure  EXAM: CHEST  2 VIEW  COMPARISON:  01/03/2015  FINDINGS: Cardiomegaly again noted. No acute infiltrate or pleural effusion. No pulmonary edema. Dual lead cardiac pacemaker is unchanged in position. Osteopenia and mild degenerative changes thoracic spine.  IMPRESSION: No active cardiopulmonary disease.   Electronically Signed   By:  Lahoma Crocker M.D.   On: 01/05/2015 16:56    ASSESSMENT AND PLAN:  Principal Problem:   Atrial fibrillation with RVR Active Problems:   Obstructive sleep apnea   Hypertension   Acute on chronic systolic heart failure   Chronic atrial fibrillation   SOB (shortness of breath), at rest   CHF (congestive heart failure), NYHA class I  1.  Persistent atrial fibrillation Previously failed Flecainide and Amiodarone.  Tikosyn started 01-15-15.   QTc is stable with underlying LBBB.   Plan DCCV today  LA size is 50 by echo 11-2014.  Likelihood of maintaining SR is decreased.   2.  Sinus node dysfunction MDT pacemaker with normal device function Continue Eliquis for CHADS2VASC of 4  3.  HTN now with low BP  d/c dilt 4. Renal insufficiency Gd3  informs dosing of dofetilide    Anticipate discharge inam

## 2015-01-18 ENCOUNTER — Encounter (HOSPITAL_COMMUNITY): Payer: Self-pay | Admitting: Cardiology

## 2015-01-18 MED ORDER — DOFETILIDE 250 MCG PO CAPS: 250.0000 ug | ORAL_CAPSULE | Freq: Two times a day (BID) | ORAL | Status: DC

## 2015-01-18 MED ORDER — METOPROLOL TARTRATE 25 MG PO TABS: 25.0000 mg | ORAL_TABLET | Freq: Two times a day (BID) | ORAL | Status: DC

## 2015-01-18 NOTE — Progress Notes (Signed)
All d/c instructions explained and given to pt and his wife at 14.  Verbalized understanding.  D/c  Off floor via w/c to awaiting transport.  Karie Kirks, Therapist, sports.

## 2015-01-18 NOTE — Discharge Summary (Addendum)
ELECTROPHYSIOLOGY PROCEDURE DISCHARGE SUMMARY    Patient ID: Don Jacobs,  MRN: [id number], DOB/AGE: Dec 11, 1935 79 y.o.  Admit date: 01/13/2015 Discharge date: 01/18/2015  Primary Care Physician: Henrine Screws, MD Primary Cardiologist: Irish Lack Electrophysiologist: Caryl Comes  Primary Discharge Diagnosis:  1.  Persistent atrial fibrillation status post Tikosyn loading this admission  Secondary Discharge Diagnosis:  1.  SSS s/p MDT dual chamber pacemaker 2.  LBBB 3.  Non ischemic cardiomyopathy  4.  Hypertension   Allergies  Allergen Reactions  . Penicillins Shortness Of Breath and Rash    Ended up at ER after using  . Oxycodone Other (See Comments)    Altered mental status     Procedures This Admission:  1.  Tikosyn loading 2.  Direct current cardioversion 4/21 which successfully restored SR.  There were no early apparent complications.   Brief HPI/Hospital Course:  Don Jacobs is a 79 y.o. male with a past medical history as noted above. He has had persistent atrial fibrillation and has failed medical therapy with metoprolol, Flecainide, and amiodarone. He was admitted 01/14/15 with worsening shortness of breath and was found to be in AF with RVR.  He was seen by Dr Caryl Comes who recommended initiation of Tikosyn.   Risks, benefits, and alternatives to Tikosyn were reviewed with the patient who wished to proceed.   Renal function and electrolytes were followed during the hospitalization.  Their QTc remained stable.  They underwent direct current cardioversion on 01/17/15 which restored sinus rhythm.  They were monitored until discharge on telemetry which demonstrated atrial pacing with intrinsic ventricular conduction.  On the day of discharge, they were examined by Dr Caryl Comes who considered them stable for discharge to home.  Follow-up has been arranged with [facility] pharmacists in 1 week  .   Physical Exam: Filed Vitals:   01/17/15 2037 01/18/15 0552 01/18/15 0700 01/18/15  0757  BP: 104/62 113/76  103/66  Pulse: 70 75  67  Temp: 97.4 F (36.3 C) 98.2 F (36.8 C)  98 F (36.7 C)  TempSrc: Oral Oral  Oral  Resp: 18 18 20 20   Height:      Weight:  216 lb (97.977 kg)    SpO2: 98% 98%  97%    GEN- The patient is well appearing, alert and oriented x 3 today.   HEENT: normocephalic, atraumatic; sclera clear, conjunctiva pink; hearing intact; oropharynx clear; neck supple, no JVP Lymph- no cervical lymphadenopathy Lungs- Clear to ausculation bilaterally, normal work of breathing.  No wheezes, rales, rhonchi Heart- Regular rate and rhythm, no murmurs, rubs or gallops  GI- soft, non-tender, non-distended, bowel sounds present  Extremities- no clubbing, cyanosis, or edema; DP/PT/radial pulses 2+ bilaterally MS- no significant deformity or atrophy Skin- warm and dry, no rash or lesion Psych- euthymic mood, full affect Neuro- strength and sensation are intact   Labs:   Lab Results  Component Value Date   WBC 5.7 01/14/2015   HGB 13.3 01/14/2015   HCT 40.6 01/14/2015   MCV 93.1 01/14/2015   PLT 156 01/14/2015     Recent Labs Lab 01/17/15 0605  NA 142  K 4.2  CL 104  CO2 30  BUN 24*  CREATININE 1.44*  CALCIUM 9.1  GLUCOSE 119*     Discharge Medications:    Medication List    TAKE these medications        allopurinol 300 MG tablet  Commonly known as:  ZYLOPRIM  Take 300 mg by mouth at bedtime.  apixaban 5 MG Tabs tablet  Commonly known as:  ELIQUIS  Take 1 tablet (5 mg total) by mouth 2 (two) times daily.     benzonatate 100 MG capsule  Commonly known as:  TESSALON  Take 1 capsule (100 mg total) by mouth 3 (three) times daily as needed for cough.     CALTRATE 600 PO  Take 600 mg by mouth at bedtime.     cholecalciferol 1000 UNITS tablet  Commonly known as:  VITAMIN D  Take 1,000 Units by mouth at bedtime.     colchicine 0.6 MG tablet  Take 0.6 mg by mouth daily as needed (gout).     cyanocobalamin 500 MCG tablet  Take  500 mcg by mouth daily.     dofetilide 250 MCG capsule  Commonly known as:  TIKOSYN  Take 1 capsule (250 mcg total) by mouth 2 (two) times daily.     doxazosin 4 MG tablet  Commonly known as:  CARDURA  Take 1 tablet (4 mg total) by mouth at bedtime.     furosemide 40 MG tablet  Commonly known as:  LASIX  Take 1 tablet (40 mg total) by mouth daily.     guaiFENesin 100 MG/5ML Soln  Commonly known as:  ROBITUSSIN  Take 10 mLs by mouth every 4 (four) hours as needed for cough or to loosen phlegm.     guaiFENesin-codeine 100-10 MG/5ML syrup  Commonly known as:  ROBITUSSIN AC  Take 5 mLs by mouth 3 (three) times daily as needed for cough.     latanoprost 0.005 % ophthalmic solution  Commonly known as:  XALATAN  Place 1 drop into both eyes at bedtime.     levothyroxine 112 MCG tablet  Commonly known as:  SYNTHROID, LEVOTHROID  Take 1 tablet (112 mcg total) by mouth daily before breakfast.     loratadine 10 MG tablet  Commonly known as:  CLARITIN  Take 5 mg by mouth 2 (two) times daily as needed for allergies or rhinitis.     metoprolol tartrate 25 MG tablet  Commonly known as:  LOPRESSOR  Take 1 tablet (25 mg total) by mouth 2 (two) times daily.     OVER THE COUNTER MEDICATION  Take 1 tablet by mouth daily. muscudine seed     potassium chloride 10 MEQ tablet  Commonly known as:  K-DUR,KLOR-CON  Take 1 tablet (10 mEq total) by mouth daily.     predniSONE 10 MG tablet  Commonly known as:  DELTASONE  Take 10 mg by mouth daily as needed (gout flare up).     Saw Palmetto Caps  Take 1 capsule by mouth daily.     vitamin C 500 MG tablet  Commonly known as:  ASCORBIC ACID  Take 500 mg by mouth daily.        Disposition:  Discharge Instructions    Diet - low sodium heart healthy    Complete by:  As directed      Increase activity slowly    Complete by:  As directed           Follow-up Information    Follow up with [facility] On 01/25/2015.   Why:  at  9:30AM for pharmacist visit   Contact information:   [place] [id number]       Follow up with Virl Axe, MD On 03/29/2015.   Specialty:  Cardiology   Why:  at 9:30AM   Contact information:  [place] [place] [address] [phone]       Follow up with Roderic Palau, NP On 02/08/2015.   Specialty:  Nurse Practitioner   Why:  at [employer] information:   [place] [address] [phone]       Follow up with Thompson Grayer, MD On 03/04/2015.   Specialty:  Cardiology   Why:  at 8:30AM   Contact information:   [place] [address] [phone]       Duration of Discharge Encounter: Greater than 30 minutes including physician time.  Signed, Chanetta Marshall, NP 01/18/2015 9:25 AM

## 2015-01-21 ENCOUNTER — Telehealth: Payer: Self-pay | Admitting: Cardiology

## 2015-01-21 NOTE — Telephone Encounter (Signed)
Patient needs a [facility] Phone Call

## 2015-01-25 ENCOUNTER — Ambulatory Visit (INDEPENDENT_AMBULATORY_CARE_PROVIDER_SITE_OTHER): Payer: Medicare Other | Admitting: Pharmacist

## 2015-01-25 DIAGNOSIS — I482 Chronic atrial fibrillation, unspecified: Secondary | ICD-10-CM

## 2015-01-25 LAB — BASIC METABOLIC PANEL
BUN: 17 mg/dL (ref 6–23)
CALCIUM: 9.6 mg/dL (ref 8.4–10.5)
CO2: 35 meq/L — AB (ref 19–32)
CREATININE: 1.13 mg/dL (ref 0.40–1.50)
Chloride: 99 mEq/L (ref 96–112)
GFR: 66.97 mL/min (ref >60.00)
Glucose, Bld: 86 mg/dL (ref 70–99)
Potassium: 3.6 mEq/L (ref 3.5–5.1)
Sodium: 140 mEq/L (ref 135–145)

## 2015-01-25 LAB — MAGNESIUM: Magnesium: 1.7 mg/dL (ref 1.5–2.5)

## 2015-01-25 NOTE — Progress Notes (Signed)
HPI  Don Jacobs is  79 yo pt of Dr. Irish Lack who has had several admissions this month related to atrial fibrillation.  He was admitted from 4/7-4/12 for new HF and Afib with RVR.  Unfortunately he continued to have SOB and was readmitted on 4/17 with Afib with RVR.  Plan was to pursue rhythm control again.  He had already failed amiodarone, flecainide and Rythmol so plan was to pursue Tikosyn. Tikosyn was started on 4/18 at 236mcg BID.  QTc remained stable.  He underwent DCCV on 4/21 and was dicharged on 4/22.  QTc at discharge was 501 msec.    Since discharge, pt states he feels better than ever.  He does have some dizziness in the AM after his dose but this resolves quickly.  He has been able to get his medication through the [place] and has not missed any doses.  Reinforced education with patient and wife.   EKG reviewed by Dr. Lovena Le.  Atrial pacemaker.  Vent rate of 65 bpm.  QTc 486 msec.    Current Outpatient Prescriptions  Medication Sig Dispense Refill  . allopurinol (ZYLOPRIM) 300 MG tablet Take 300 mg by mouth at bedtime.     Marland Kitchen apixaban (ELIQUIS) 5 MG TABS tablet Take 1 tablet (5 mg total) by mouth 2 (two) times daily. 180 tablet 3  . benzonatate (TESSALON) 100 MG capsule Take 1 capsule (100 mg total) by mouth 3 (three) times daily as needed for cough. 90 capsule 1  . Calcium Carbonate (CALTRATE 600 PO) Take 600 mg by mouth at bedtime.     . cholecalciferol (VITAMIN D) 1000 UNITS tablet Take 1,000 Units by mouth at bedtime.    . colchicine 0.6 MG tablet Take 0.6 mg by mouth daily as needed (gout).     . cyanocobalamin 500 MCG tablet Take 500 mcg by mouth daily.    Marland Kitchen dofetilide (TIKOSYN) 250 MCG capsule Take 1 capsule (250 mcg total) by mouth 2 (two) times daily.    Marland Kitchen doxazosin (CARDURA) 4 MG tablet Take 1 tablet (4 mg total) by mouth at bedtime. 30 tablet 11  . furosemide (LASIX) 40 MG tablet Take 1 tablet (40 mg total) by mouth daily. 30 tablet 11  . guaiFENesin (ROBITUSSIN) 100 MG/5ML  SOLN Take 10 mLs by mouth every 4 (four) hours as needed for cough or to loosen phlegm.    Marland Kitchen guaiFENesin-codeine (ROBITUSSIN AC) 100-10 MG/5ML syrup Take 5 mLs by mouth 3 (three) times daily as needed for cough.    . latanoprost (XALATAN) 0.005 % ophthalmic solution Place 1 drop into both eyes at bedtime.    Marland Kitchen levothyroxine (SYNTHROID, LEVOTHROID) 112 MCG tablet Take 1 tablet (112 mcg total) by mouth daily before breakfast. 30 tablet 3  . loratadine (CLARITIN) 10 MG tablet Take 5 mg by mouth 2 (two) times daily as needed for allergies or rhinitis.     . metoprolol tartrate (LOPRESSOR) 25 MG tablet Take 1 tablet (25 mg total) by mouth 2 (two) times daily. 60 tablet 1  . Misc Natural Products (SAW PALMETTO) CAPS Take 1 capsule by mouth daily.    Marland Kitchen OVER THE COUNTER MEDICATION Take 1 tablet by mouth daily. muscudine seed    . potassium chloride (K-DUR,KLOR-CON) 10 MEQ tablet Take 1 tablet (10 mEq total) by mouth daily. 30 tablet 11  . predniSONE (DELTASONE) 10 MG tablet Take 10 mg by mouth daily as needed (gout flare up).     . vitamin C (ASCORBIC ACID) 500 MG  tablet Take 500 mg by mouth daily.      No current facility-administered medications for this visit.   Allergies  Allergen Reactions  . Penicillins Shortness Of Breath and Rash    Ended up at ER after using  . Oxycodone Other (See Comments)    Altered mental status    Assessment and Plan  1. Atrial Fibrillation- Pt feeling much better since starting Tikosyn.  He has not had any problems since discharge other than dizziness which will hopefully continue to improve.  QTc stable.  Will check electrolytes today.  Pt has follow up with Roderic Palau in the [facility] in 4 weeks.

## 2015-01-25 NOTE — Telephone Encounter (Signed)
Patient contacted regarding discharge from [facility] on 01/18/15  Patient understands to follow up with provider donna carroll on 02/08/15 at 10 am at heart failure clinic location Patient understands discharge instructions? yes Patient understands medications and regiment? yes Patient understands to bring all medications to this visit? yes

## 2015-02-05 ENCOUNTER — Encounter: Payer: Self-pay | Admitting: Cardiology

## 2015-02-07 ENCOUNTER — Encounter: Payer: Self-pay | Admitting: Internal Medicine

## 2015-02-08 ENCOUNTER — Ambulatory Visit (HOSPITAL_COMMUNITY)
Admission: RE | Admit: 2015-02-08 | Discharge: 2015-02-08 | Disposition: A | Discharge status: Home / Self Care | Payer: Medicare Other | Source: Ambulatory Visit | Attending: Nurse Practitioner | Admitting: Nurse Practitioner

## 2015-02-08 ENCOUNTER — Encounter (HOSPITAL_COMMUNITY): Payer: Self-pay | Admitting: Nurse Practitioner

## 2015-02-08 ENCOUNTER — Telehealth (HOSPITAL_COMMUNITY): Payer: Self-pay | Admitting: *Deleted

## 2015-02-08 VITALS — HR 68 | Ht 69.0 in | Wt 222.2 lb

## 2015-02-08 DIAGNOSIS — I481 Persistent atrial fibrillation: Secondary | ICD-10-CM | POA: Insufficient documentation

## 2015-02-08 DIAGNOSIS — I4819 Other persistent atrial fibrillation: Secondary | ICD-10-CM

## 2015-02-08 LAB — BASIC METABOLIC PANEL
Anion gap: 11 (ref 5–15)
BUN: 12 mg/dL (ref 6–20)
CHLORIDE: 99 mmol/L — AB (ref 101–111)
CO2: 31 mmol/L (ref 22–32)
Calcium: 9.2 mg/dL (ref 8.9–10.3)
Creatinine, Ser: 1.15 mg/dL (ref 0.61–1.24)
GFR calc Af Amer: >60 mL/min (ref >60)
GFR, EST NON AFRICAN AMERICAN: 60 mL/min — AB (ref >60)
Glucose, Bld: 86 mg/dL (ref 65–99)
Potassium: 3.5 mmol/L (ref 3.5–5.1)
SODIUM: 141 mmol/L (ref 135–145)

## 2015-02-08 LAB — MAGNESIUM: MAGNESIUM: 1.7 mg/dL (ref 1.7–2.4)

## 2015-02-08 MED ORDER — POTASSIUM CHLORIDE CRYS ER 10 MEQ PO TBCR: 20.0000 meq | EXTENDED_RELEASE_TABLET | Freq: Every day | ORAL | Status: DC

## 2015-02-08 MED ORDER — MAGNESIUM 400 MG PO CAPS: 400.0000 mg | ORAL_CAPSULE | Freq: Two times a day (BID) | ORAL | Status: AC

## 2015-02-08 NOTE — Progress Notes (Signed)
Patient ID: Don Jacobs, male   DOB: [birth year], 79 y.o.   MRN: [id number]     Primary Care Physician: Don Screws, MD Referring Physician:   CHAZE Jacobs is a 79 y.o. male with a h/o SSS s/p MDT dual chamber dual chamber PPM,  LBBB, non ischemic  persistent atrial fibrillation and has failed medical therapy with metoprolol, Flecainide, and amiodarone. He was admitted 01/14/15 with worsening shortness of breath and was found to be in AF with RVR. He was seen by Dr Don Jacobs who recommended initiation of Tikosyn. Risks, benefits, and alternatives to Tikosyn were reviewed with the patient who wished to proceed. Renal function and electrolytes were followed during the hospitalization. QTc remained stable. Underwent direct current cardioversion on 01/17/15 which restored sinus rhythm. He was monitored until discharge on telemetry which demonstrated atrial pacing with intrinsic ventricular conduction. On the day of discharge, they were examined by Dr Don Jacobs who considered  stable for discharge to home.  He reports 2 very short lived dizzy spells and has put on 4 lbs of fluid in the last 3-4 days. No awareness of afib. Is in a paced rhythm today but PPM interrogation shows maintenance of SR, only .2% afib burden. CHA2DS2VASc score is at least 4 and is tolerating apixaban.  Today, he denies symptoms of palpitations, chest pain, shortness of breath, orthopnea, PND, trace lower extremity edema,  Mild intermittent dizziness, no presyncope, syncope, or neurologic sequela. The patient is tolerating medications without difficulties and is otherwise without complaint today.   Past Medical History  Diagnosis Date  . Hypertension   . A-fib   . Sleep apnea   . Gout   . Hypothyroidism   . Allergic rhinitis   . ED (erectile dysfunction)   . BPH (benign prostatic hyperplasia)   . Hearing loss     uses hearing a cyst  . Hx of migraines   . Prostatitis, chronic   . Spondylolisthesis 07/2010  .  Seborrheic keratosis   . Aortic aneurysm, thoracic 05/25/2011  . Symptomatic bradycardia     a. s/p PPM   . Chronic atrial fibrillation     a. on Elqiuis  . Chronic systolic CHF (congestive heart failure)    Past Surgical History  Procedure Laterality Date  . Back surgery    . L5 selective nerve root block      Dr Don Jacobs  . Foot surgery Right ~ 2007    "reconstruction" (02/17/2013)  . Shoulder open rotator cuff repair Right 09/2007; 2013  . Repair peroneal tendons ankle Right 10/2003  . Knee arthroscopy Left ~ 2007  . Anterior cervical decomp/discectomy fusion  10/2000    "C4, 5, 6 w/fusion" (02/17/2013)  . Lumbar spine surgery  2012    "bone graft, Dr. Sherwood Jacobs" (02/17/2013)  . Insert / replace / remove pacemaker  02/17/2013  . Cardiac catheterization  2000's    "once" (02/17/2013)  . Cardioversion N/A 04/09/2014    Procedure: CARDIOVERSION;  Surgeon: Don Perla, MD;  Location: [facility] ENDOSCOPY;  Service: Cardiovascular;  Laterality: N/A;  . Permanent pacemaker insertion N/A 02/17/2013    Procedure: PERMANENT PACEMAKER INSERTION;  Surgeon: Don Sprang, MD;  Location: [facility] CATH LAB;  Service: Cardiovascular;  Laterality: N/A;  . Cardioversion N/A 01/17/2015    Procedure: CARDIOVERSION;  Surgeon: Don Dresser, MD;  Location: [facility];  Service: Cardiovascular;  Laterality: N/A;    Current Outpatient Prescriptions  Medication Sig Dispense Refill  . allopurinol (ZYLOPRIM) 300 MG tablet Take  300 mg by mouth at bedtime.     Marland Kitchen apixaban (ELIQUIS) 5 MG TABS tablet Take 1 tablet (5 mg total) by mouth 2 (two) times daily. 180 tablet 3  . benzonatate (TESSALON) 100 MG capsule Take 1 capsule (100 mg total) by mouth 3 (three) times daily as needed for cough. 90 capsule 1  . Calcium Carbonate (CALTRATE 600 PO) Take 600 mg by mouth at bedtime.     . cholecalciferol (VITAMIN D) 1000 UNITS tablet Take 1,000 Units by mouth at bedtime.    . colchicine 0.6 MG tablet Take 0.6 mg by mouth daily as  needed (gout).     . cyanocobalamin 500 MCG tablet Take 500 mcg by mouth daily.    Marland Kitchen dofetilide (TIKOSYN) 250 MCG capsule Take 1 capsule (250 mcg total) by mouth 2 (two) times daily.    Marland Kitchen doxazosin (CARDURA) 4 MG tablet Take 1 tablet (4 mg total) by mouth at bedtime. 30 tablet 11  . furosemide (LASIX) 40 MG tablet Take 1 tablet (40 mg total) by mouth daily. 30 tablet 11  . guaiFENesin (ROBITUSSIN) 100 MG/5ML SOLN Take 10 mLs by mouth every 4 (four) hours as needed for cough or to loosen phlegm.    Marland Kitchen guaiFENesin-codeine (ROBITUSSIN AC) 100-10 MG/5ML syrup Take 5 mLs by mouth 3 (three) times daily as needed for cough.    . latanoprost (XALATAN) 0.005 % ophthalmic solution Place 1 drop into both eyes at bedtime.    Marland Kitchen levothyroxine (SYNTHROID, LEVOTHROID) 112 MCG tablet Take 1 tablet (112 mcg total) by mouth daily before breakfast. 30 tablet 3  . loratadine (CLARITIN) 10 MG tablet Take 5 mg by mouth 2 (two) times daily as needed for allergies or rhinitis.     . metoprolol tartrate (LOPRESSOR) 25 MG tablet Take 1 tablet (25 mg total) by mouth 2 (two) times daily. 60 tablet 1  . Misc Natural Products (SAW PALMETTO) CAPS Take 1 capsule by mouth daily.    Marland Kitchen OVER THE COUNTER MEDICATION Take 1 tablet by mouth daily. muscudine seed    . potassium chloride (K-DUR,KLOR-CON) 10 MEQ tablet Take 1 tablet (10 mEq total) by mouth daily. 30 tablet 11  . predniSONE (DELTASONE) 10 MG tablet Take 10 mg by mouth daily as needed (gout flare up).     . vitamin C (ASCORBIC ACID) 500 MG tablet Take 500 mg by mouth daily.      No current facility-administered medications for this encounter.    Allergies  Allergen Reactions  . Penicillins Shortness Of Breath and Rash    Ended up at ER after using  . Oxycodone Other (See Comments)    Altered mental status    History   Social History  . Marital Status: Married    Spouse Name: N/A  . Number of Children: 2  . Years of Education: N/A   Occupational History  .  retired    Social History Main Topics  . Smoking status: Former Smoker -- 1.00 packs/day for 12 years    Types: Cigarettes    Quit date: 03/11/1981  . Smokeless tobacco: Never Used  . Alcohol Use: No  . Drug Use: No  . Sexual Activity: Not on file   Other Topics Concern  . Not on file   Social History Narrative    Family History  Problem Relation Age of Onset  . Heart disease Father     ROS- All systems are reviewed and negative except as per the HPI above  Physical  Exam: Filed Vitals:   02/08/15 0938  Pulse: 68  Height: 5\' 9"  (1.753 m)  Weight: 222 lb 3.2 oz (100.789 kg)    GEN- The patient is well appearing, alert and oriented x 3 today.   Head- normocephalic, atraumatic Eyes-  Sclera clear, conjunctiva pink Ears- hearing intact Oropharynx- clear Neck- supple, no JVP Lymph- no cervical lymphadenopathy Lungs- Clear to ausculation bilaterally, normal work of breathing Heart- Regular rate and rhythm, no murmurs, rubs or gallops, PMI not laterally displaced GI- soft, NT, ND, + BS Extremities- no clubbing, cyanosis, or edema MS- no significant deformity or atrophy Skin- no rash or lesion Psych- euthymic mood, full affect Neuro- strength and sensation are intact  EKG- atrial paced at 68 bpm, Qrs int 90 ms, QTc 440 ms PPM interrogation shows underlying SR with 0.2% afib burden. Labs: Bmet 2 weeks ago, Kt at 3.6 with normal kidney function, mag 1.7 Epic records reviewed. Assessment and Plan:  1. Persistent afib Continue tikosyn, instructed on importance of taking on a regular basis. Repeat Bmet/mag Pt instructed to increase Kt based on last labs to 20 meq Kt a day and increase OTC magnesium to BID from qd.  Based on today's labs, will determine next redraw of bmet and mag.  2. NICM Has picked up 4 lbs of fluid over the last 3-4 days Increase lasix to 1 1/2 tabs for 2-3 days until back to dry weight. He and his wife are doing a great job to eliminate salt from  diet.  3. HTN Well managed.  4. Encouraged regular exercise and weight loss efforts  F/u as scheduled with Dr. Rayann Heman as previously scheduled for consideration for ablation 6/6, and with Dr. Caryl Jacobs 7/1.

## 2015-02-08 NOTE — Patient Instructions (Signed)
Your physician has recommended you make the following change in your medication:  1)Lasix increase to 1 1/2 tablets for next 2-3 days until back to baseline weight then return to 1 tablet

## 2015-02-08 NOTE — Telephone Encounter (Signed)
Notified to increase potassium to 16mEq a day and increase magnesium to 400mg  BID. Lab appointment made

## 2015-02-11 ENCOUNTER — Telehealth (HOSPITAL_COMMUNITY): Payer: Self-pay | Admitting: Nurse Practitioner

## 2015-02-11 NOTE — Telephone Encounter (Signed)
Pt calling to say gained 2 more lbs over the week end despite increasing an extra 1/2 tab of lasix. Increased to 2 tabs a day yesterday on his own and today lost an 1/2 lb. Instructed to continue with 2 tabs for next 2-3 days and call back on Wednesday and let me know results. He is watching salt and fluid intake.Eating Kt rich and magnesium rich foods plus on kt and mag supplement.

## 2015-02-14 ENCOUNTER — Other Ambulatory Visit (HOSPITAL_COMMUNITY): Payer: Self-pay | Admitting: *Deleted

## 2015-02-14 ENCOUNTER — Ambulatory Visit (HOSPITAL_COMMUNITY)
Admission: RE | Admit: 2015-02-14 | Discharge: 2015-02-14 | Disposition: A | Discharge status: Home / Self Care | Payer: Medicare Other | Source: Ambulatory Visit | Attending: Nurse Practitioner | Admitting: Nurse Practitioner

## 2015-02-14 ENCOUNTER — Encounter (HOSPITAL_COMMUNITY): Payer: Self-pay | Admitting: Nurse Practitioner

## 2015-02-14 VITALS — BP 118/80 | HR 85 | Ht 69.0 in | Wt 224.4 lb

## 2015-02-14 DIAGNOSIS — I481 Persistent atrial fibrillation: Secondary | ICD-10-CM | POA: Diagnosis present

## 2015-02-14 DIAGNOSIS — I4819 Other persistent atrial fibrillation: Secondary | ICD-10-CM

## 2015-02-14 DIAGNOSIS — I10 Essential (primary) hypertension: Secondary | ICD-10-CM | POA: Insufficient documentation

## 2015-02-14 DIAGNOSIS — I428 Other cardiomyopathies: Secondary | ICD-10-CM | POA: Insufficient documentation

## 2015-02-14 DIAGNOSIS — Z87891 Personal history of nicotine dependence: Secondary | ICD-10-CM | POA: Insufficient documentation

## 2015-02-14 LAB — BASIC METABOLIC PANEL
ANION GAP: 10 (ref 5–15)
BUN: 12 mg/dL (ref 6–20)
CHLORIDE: 98 mmol/L — AB (ref 101–111)
CO2: 33 mmol/L — ABNORMAL HIGH (ref 22–32)
Calcium: 9.4 mg/dL (ref 8.9–10.3)
Creatinine, Ser: 1.18 mg/dL (ref 0.61–1.24)
GFR, EST NON AFRICAN AMERICAN: 58 mL/min — AB (ref >60)
Glucose, Bld: 111 mg/dL — ABNORMAL HIGH (ref 65–99)
Potassium: 3.9 mmol/L (ref 3.5–5.1)
Sodium: 141 mmol/L (ref 135–145)

## 2015-02-14 LAB — MAGNESIUM: Magnesium: 2 mg/dL (ref 1.7–2.4)

## 2015-02-14 MED ORDER — DOFETILIDE 250 MCG PO CAPS: 250.0000 ug | ORAL_CAPSULE | Freq: Two times a day (BID) | ORAL | Status: DC

## 2015-02-14 NOTE — Progress Notes (Signed)
Patient ID: Don Jacobs, male   DOB: [birth year], 79 y.o.   MRN: [id number]        Primary Care Physician: Henrine Screws, MD  Electrophysiologist: Dr. Harlow Mares is a 79 y.o. male with a h/o SSS s/p MDT dual chamber dual chamber PPM,  LBBB, non ischemic  persistent atrial fibrillation and has failed medical therapy with metoprolol, Flecainide, and amiodarone. He was admitted 01/14/15 with worsening shortness of breath and was found to be in AF with RVR. He was seen by Dr Caryl Comes who recommended initiation of Tikosyn. Risks, benefits, and alternatives to Tikosyn were reviewed with the patient who wished to proceed. Renal function and electrolytes were followed during the hospitalization. QTc remained stable. Underwent direct current cardioversion on 01/17/15 which restored sinus rhythm. He was monitored until discharge on telemetry which demonstrated atrial pacing with intrinsic ventricular conduction. On evaluation in afib clinic 5/19,reported 2 very short lived dizzy spells and had put on 4 lbs of fluid in the previous 3-4 days. No awareness of afib. PPM interrogation shows maintenance of SR, only .2% afib burden. CHA2DS2VASc scoreat least 4 and is tolerating apixaban. His lasix was increased to 1 1/2 tab a day and Kt and magnesium supplementation increased. He called this past Monday and reported he gained another 1 lb of fluid a day over the weekend and lasix was increased to 40 bid. Today, his weight has stabilized to his dry weight of 218-220. He feels well, no awareness of afib.  Today, he denies symptoms of palpitations, chest pain, shortness of breath, orthopnea, PND, trace lower extremity edema,  Mild intermittent dizziness, no presyncope, syncope, or neurologic sequela. The patient is tolerating medications without difficulties and is otherwise without complaint today.   Past Medical History  Diagnosis Date  . Hypertension   . A-fib   . Sleep apnea   . Gout     . Hypothyroidism   . Allergic rhinitis   . ED (erectile dysfunction)   . BPH (benign prostatic hyperplasia)   . Hearing loss     uses hearing a cyst  . Hx of migraines   . Prostatitis, chronic   . Spondylolisthesis 07/2010  . Seborrheic keratosis   . Aortic aneurysm, thoracic 05/25/2011  . Symptomatic bradycardia     a. s/p PPM   . Chronic atrial fibrillation     a. on Elqiuis  . Chronic systolic CHF (congestive heart failure)    Past Surgical History  Procedure Laterality Date  . Back surgery    . L5 selective nerve root block      Dr Nelva Bush  . Foot surgery Right ~ 2007    "reconstruction" (02/17/2013)  . Shoulder open rotator cuff repair Right 09/2007; 2013  . Repair peroneal tendons ankle Right 10/2003  . Knee arthroscopy Left ~ 2007  . Anterior cervical decomp/discectomy fusion  10/2000    "C4, 5, 6 w/fusion" (02/17/2013)  . Lumbar spine surgery  2012    "bone graft, Dr. [name]" (02/17/2013)  . Insert / replace / remove pacemaker  02/17/2013  . Cardiac catheterization  2000's    "once" (02/17/2013)  . Cardioversion N/A 04/09/2014    Procedure: CARDIOVERSION;  Surgeon: Lelon Perla, MD;  Location: [facility] ENDOSCOPY;  Service: Cardiovascular;  Laterality: N/A;  . Permanent pacemaker insertion N/A 02/17/2013    Procedure: PERMANENT PACEMAKER INSERTION;  Surgeon: Deboraha Sprang, MD;  Location: [facility] CATH LAB;  Service: Cardiovascular;  Laterality: N/A;  . Cardioversion  N/A 01/17/2015    Procedure: CARDIOVERSION;  Surgeon: Larey Dresser, MD;  Location: [place];  Service: Cardiovascular;  Laterality: N/A;    Current Outpatient Prescriptions  Medication Sig Dispense Refill  . allopurinol (ZYLOPRIM) 300 MG tablet Take 300 mg by mouth at bedtime.     Marland Kitchen apixaban (ELIQUIS) 5 MG TABS tablet Take 1 tablet (5 mg total) by mouth 2 (two) times daily. 180 tablet 3  . benzonatate (TESSALON) 100 MG capsule Take 1 capsule (100 mg total) by mouth 3 (three) times daily as needed for cough. 90  capsule 1  . Calcium Carbonate (CALTRATE 600 PO) Take 600 mg by mouth at bedtime.     . cholecalciferol (VITAMIN D) 1000 UNITS tablet Take 1,000 Units by mouth at bedtime.    . colchicine 0.6 MG tablet Take 0.6 mg by mouth daily as needed (gout).     . cyanocobalamin 500 MCG tablet Take 500 mcg by mouth daily.    Marland Kitchen doxazosin (CARDURA) 4 MG tablet Take 1 tablet (4 mg total) by mouth at bedtime. 30 tablet 11  . furosemide (LASIX) 40 MG tablet Take 1 tablet (40 mg total) by mouth daily. (Patient taking differently: Take 40 mg by mouth 2 (two) times daily. ) 30 tablet 11  . guaiFENesin (ROBITUSSIN) 100 MG/5ML SOLN Take 10 mLs by mouth every 4 (four) hours as needed for cough or to loosen phlegm.    Marland Kitchen guaiFENesin-codeine (ROBITUSSIN AC) 100-10 MG/5ML syrup Take 5 mLs by mouth 3 (three) times daily as needed for cough.    . latanoprost (XALATAN) 0.005 % ophthalmic solution Place 1 drop into both eyes at bedtime.    Marland Kitchen levothyroxine (SYNTHROID, LEVOTHROID) 112 MCG tablet Take 1 tablet (112 mcg total) by mouth daily before breakfast. 30 tablet 3  . loratadine (CLARITIN) 10 MG tablet Take 5 mg by mouth 2 (two) times daily as needed for allergies or rhinitis.     . Magnesium 400 MG CAPS Take 400 mg by mouth 2 (two) times daily.    . metoprolol tartrate (LOPRESSOR) 25 MG tablet Take 1 tablet (25 mg total) by mouth 2 (two) times daily. 60 tablet 1  . Misc Natural Products (SAW PALMETTO) CAPS Take 1 capsule by mouth daily.    Marland Kitchen OVER THE COUNTER MEDICATION Take 1 tablet by mouth daily. muscudine seed    . potassium chloride (K-DUR,KLOR-CON) 10 MEQ tablet Take 2 tablets (20 mEq total) by mouth daily. 30 tablet 11  . predniSONE (DELTASONE) 10 MG tablet Take 10 mg by mouth daily as needed (gout flare up).     . vitamin C (ASCORBIC ACID) 500 MG tablet Take 500 mg by mouth daily.     Marland Kitchen dofetilide (TIKOSYN) 250 MCG capsule Take 1 capsule (250 mcg total) by mouth 2 (two) times daily. 60 capsule 3   No current  facility-administered medications for this encounter.    Allergies  Allergen Reactions  . Penicillins Shortness Of Breath and Rash    Ended up at ER after using  . Oxycodone Other (See Comments)    Altered mental status    History   Social History  . Marital Status: Married    Spouse Name: N/A  . Number of Children: 2  . Years of Education: N/A   Occupational History  . retired    Social History Main Topics  . Smoking status: Former Smoker -- 1.00 packs/day for 12 years    Types: Cigarettes    Quit date: 03/11/1981  .  Smokeless tobacco: Never Used  . Alcohol Use: No  . Drug Use: No  . Sexual Activity: Not on file   Other Topics Concern  . Not on file   Social History Narrative    Family History  Problem Relation Age of Onset  . Heart disease Father     ROS- All systems are reviewed and negative except as per the HPI above  Physical Exam: Filed Vitals:   02/14/15 1001  BP: 118/80  Pulse: 85  Height: 5\' 9"  (1.753 m)  Weight: 224 lb 6.4 oz (101.787 kg)    GEN- The patient is well appearing, alert and oriented x 3 today.   Head- normocephalic, atraumatic Eyes-  Sclera clear, conjunctiva pink Ears- hearing intact Oropharynx- clear Neck- supple, no JVP Lymph- no cervical lymphadenopathy Lungs- Clear to ausculation bilaterally, normal work of breathing Heart- Regular rate and rhythm, no murmurs, rubs or gallops, PMI not laterally displaced GI- soft, NT, ND, + BS Extremities- no clubbing, cyanosis, or edema MS- no significant deformity or atrophy Skin- no rash or lesion Psych- euthymic mood, full affect Neuro- strength and sensation are intact  EKG-not performed today. PPM interrogation shows underlying SR with 0.2% afib burden. Labs: Bmet 2 weeks ago, Kt at 3.6 with normal kidney function, mag 1.7 Epic records reviewed. Assessment and Plan:  1. Persistent afib Continue tikosyn, instructed on importance of taking on a regular basis. Repeat Bmet/mag  today with extra lasix recently Continue Kt 10 meq x2 daily and mag 400 mg bid  2. NICM Weight has stabilized. Can try to reduce lasix back to 60 mg a day and if weight is stable, reduce again to 40 mg a day. He and his wife are doing a great job to eliminate salt from diet with daily weights  3. HTN Well managed.  4. Encouraged regular exercise and weight loss efforts  F/u as scheduled with Dr. Rayann Heman as previously scheduled for consideration for ablation 6/6, and with Dr. Caryl Comes 7/1.

## 2015-02-19 DIAGNOSIS — M2242 Chondromalacia patellae, left knee: Secondary | ICD-10-CM | POA: Diagnosis not present

## 2015-02-19 DIAGNOSIS — M1712 Unilateral primary osteoarthritis, left knee: Secondary | ICD-10-CM | POA: Diagnosis not present

## 2015-02-20 ENCOUNTER — Encounter: Payer: Medicare Other | Admitting: Internal Medicine

## 2015-02-27 ENCOUNTER — Telehealth (HOSPITAL_COMMUNITY): Payer: Self-pay | Admitting: Nurse Practitioner

## 2015-02-27 NOTE — Telephone Encounter (Signed)
Pt called to report that he had episodes x 3 today where he was aware of a funny taste in his mouth, felt mildly dizzy and his heart rate picked up to 100 bpm. Episodes only lasted a few minutes each. He feels great now. I advised to send a remote pacer report and Spoke to Terrence Dupont in [place] to watch out for transmission to see if there were any arhythmias present. Pt was loaded on tikosyn x one month ago.

## 2015-02-28 ENCOUNTER — Telehealth: Payer: Self-pay | Admitting: *Deleted

## 2015-02-28 NOTE — Telephone Encounter (Signed)
Called patient at the request of Roderic Palau, NP to review transmission 02/27/15. No episodes from yesterday when the patient was symptomatic. Only 2 episodes were 02/25/15 and 02/13/15 both lasting 1 second. Patient and wife appreciative of call. Will route to [employer].

## 2015-03-04 ENCOUNTER — Encounter: Payer: Self-pay | Admitting: Internal Medicine

## 2015-03-04 ENCOUNTER — Ambulatory Visit (INDEPENDENT_AMBULATORY_CARE_PROVIDER_SITE_OTHER): Payer: Medicare Other | Admitting: Internal Medicine

## 2015-03-04 VITALS — BP 110/70 | HR 74 | Ht 69.0 in | Wt 224.6 lb

## 2015-03-04 DIAGNOSIS — R001 Bradycardia, unspecified: Secondary | ICD-10-CM | POA: Diagnosis not present

## 2015-03-04 LAB — CUP PACEART INCLINIC DEVICE CHECK
Battery Remaining Longevity: 87 mo
Brady Statistic RA Percent Paced: 82.69 %
Date Time Interrogation Session: 20160606092742
Lead Channel Impedance Value: 399 Ohm
Lead Channel Impedance Value: 494 Ohm
Lead Channel Pacing Threshold Pulse Width: 0.4 ms
Lead Channel Setting Pacing Amplitude: 2.5 V
Lead Channel Setting Pacing Pulse Width: 0.4 ms
MDC IDC MSMT BATTERY VOLTAGE: 3.01 V
MDC IDC MSMT LEADCHNL RA IMPEDANCE VALUE: 418 Ohm
MDC IDC MSMT LEADCHNL RA PACING THRESHOLD AMPLITUDE: 0.5 V
MDC IDC MSMT LEADCHNL RA PACING THRESHOLD PULSEWIDTH: 0.4 ms
MDC IDC MSMT LEADCHNL RA SENSING INTR AMPL: 2.25 mV
MDC IDC MSMT LEADCHNL RV IMPEDANCE VALUE: 437 Ohm
MDC IDC MSMT LEADCHNL RV PACING THRESHOLD AMPLITUDE: 0.75 V
MDC IDC MSMT LEADCHNL RV SENSING INTR AMPL: 10.375 mV
MDC IDC SET LEADCHNL RA PACING AMPLITUDE: 2 V
MDC IDC SET LEADCHNL RV SENSING SENSITIVITY: 4 mV
MDC IDC SET ZONE DETECTION INTERVAL: 400 ms
MDC IDC STAT BRADY AP VP PERCENT: 0.05 %
MDC IDC STAT BRADY AP VS PERCENT: 82.65 %
MDC IDC STAT BRADY AS VP PERCENT: 0 %
MDC IDC STAT BRADY AS VS PERCENT: 17.3 %
MDC IDC STAT BRADY RV PERCENT PACED: 0.05 %
Zone Setting Detection Interval: 350 ms

## 2015-03-04 MED ORDER — FUROSEMIDE 40 MG PO TABS: 40.0000 mg | ORAL_TABLET | Freq: Every day | ORAL | Status: DC

## 2015-03-04 NOTE — Patient Instructions (Signed)
Medication Instructions:  Your physician has recommended you make the following change in your medication:   1)Decrease Furosemide to 40 mg daily   Labwork: None ordered  Testing/Procedures: None ordered  Follow-Up: Your physician recommends that you schedule a follow-up appointment in: 4 months with Roderic Palau, NP and as scheduled with Dr Caryl Comes in July   Any Other Special Instructions Will Be Listed Below (If Applicable).

## 2015-03-04 NOTE — Progress Notes (Signed)
Electrophysiology Office Note   Date:  03/04/2015   ID:  Don Jacobs, DOB 11/23/1935, MRN [id number]  PCP:  Henrine Screws, MD  Cardiologist:  Dr Irish Lack Primary EP: Caryl Comes   Chief Complaint  Patient presents with  . Atrial Fibrillation     History of Present Illness: Don Jacobs is a 79 y.o. male who presents today for electrophysiology evaluation.   Pt with persistent afib which has previously been difficult to control.  He has failed medical therapy with flecainide and amiodarone.  Recently initiated on tikosyn with very good result.  He has had no further afib.  He feels "Much better" and is pleased with his results.  Tolerating tikosyn without difficulty. His lasix was previously increased due to CHF.  His CHF is better though he now has postural dizziness.   Today, he denies symptoms of palpitations, chest pain, shortness of breath, orthopnea, PND, lower extremity edema, claudication,  presyncope, syncope, bleeding, or neurologic sequela. The patient is tolerating medications without difficulties and is otherwise without complaint today.    Past Medical History  Diagnosis Date  . Hypertension   . Persistent atrial fibrillation   . Sleep apnea   . Gout   . Hypothyroidism   . Allergic rhinitis   . ED (erectile dysfunction)   . BPH (benign prostatic hyperplasia)   . Hearing loss     uses hearing a cyst  . Hx of migraines   . Prostatitis, chronic   . Spondylolisthesis 07/2010  . Seborrheic keratosis   . Aortic aneurysm, thoracic 05/25/2011  . Sick sinus syndrome     a. s/p PPM   . Chronic systolic CHF (congestive heart failure)    Past Surgical History  Procedure Laterality Date  . Back surgery    . L5 selective nerve root block      Dr Nelva Bush  . Foot surgery Right ~ 2007    "reconstruction" (02/17/2013)  . Shoulder open rotator cuff repair Right 09/2007; 2013  . Repair peroneal tendons ankle Right 10/2003  . Knee arthroscopy Left ~ 2007  . Anterior  cervical decomp/discectomy fusion  10/2000    "C4, 5, 6 w/fusion" (02/17/2013)  . Lumbar spine surgery  2012    "bone graft, Dr. [name]" (02/17/2013)  . Insert / replace / remove pacemaker  02/17/2013  . Cardiac catheterization  2000's    "once" (02/17/2013)  . Cardioversion N/A 04/09/2014    Procedure: CARDIOVERSION;  Surgeon: Lelon Perla, MD;  Location: [facility] ENDOSCOPY;  Service: Cardiovascular;  Laterality: N/A;  . Permanent pacemaker insertion N/A 02/17/2013    Procedure: PERMANENT PACEMAKER INSERTION;  Surgeon: Deboraha Sprang, MD;  Location: [facility] CATH LAB;  Service: Cardiovascular;  Laterality: N/A;  . Cardioversion N/A 01/17/2015    Procedure: CARDIOVERSION;  Surgeon: Larey Dresser, MD;  Location: [place];  Service: Cardiovascular;  Laterality: N/A;     Current Outpatient Prescriptions  Medication Sig Dispense Refill  . allopurinol (ZYLOPRIM) 300 MG tablet Take 300 mg by mouth at bedtime.     Marland Kitchen apixaban (ELIQUIS) 5 MG TABS tablet Take 1 tablet (5 mg total) by mouth 2 (two) times daily. 180 tablet 3  . benzonatate (TESSALON) 100 MG capsule Take 1 capsule (100 mg total) by mouth 3 (three) times daily as needed for cough. 90 capsule 1  . Calcium Carbonate (CALTRATE 600 PO) Take 600 mg by mouth at bedtime.     . cholecalciferol (VITAMIN D) 1000 UNITS tablet Take 1,000 Units by  mouth at bedtime.    . colchicine 0.6 MG tablet Take 0.6 mg by mouth daily as needed (gout).     . cyanocobalamin 500 MCG tablet Take 500 mcg by mouth daily.    Marland Kitchen dofetilide (TIKOSYN) 250 MCG capsule Take 1 capsule (250 mcg total) by mouth 2 (two) times daily. 60 capsule 3  . doxazosin (CARDURA) 4 MG tablet Take 1 tablet (4 mg total) by mouth at bedtime. 30 tablet 11  . furosemide (LASIX) 40 MG tablet Take 1 tablet (40 mg total) by mouth daily. (Patient taking differently: Take 40 mg by mouth 2 (two) times daily. ) 30 tablet 11  . guaiFENesin (ROBITUSSIN) 100 MG/5ML SOLN Take 10 mLs by mouth every 4 (four) hours  as needed for cough or to loosen phlegm.    Marland Kitchen guaiFENesin-codeine (ROBITUSSIN AC) 100-10 MG/5ML syrup Take 5 mLs by mouth 3 (three) times daily as needed for cough.    . latanoprost (XALATAN) 0.005 % ophthalmic solution Place 1 drop into both eyes at bedtime.    Marland Kitchen levothyroxine (SYNTHROID, LEVOTHROID) 112 MCG tablet Take 1 tablet (112 mcg total) by mouth daily before breakfast. 30 tablet 3  . loratadine (CLARITIN) 10 MG tablet Take 5 mg by mouth 2 (two) times daily as needed for allergies or rhinitis.     . Magnesium 400 MG CAPS Take 400 mg by mouth 2 (two) times daily.    . metoprolol tartrate (LOPRESSOR) 25 MG tablet Take 1 tablet (25 mg total) by mouth 2 (two) times daily. 60 tablet 1  . Misc Natural Products (SAW PALMETTO) CAPS Take 1 capsule by mouth daily.    Marland Kitchen OVER THE COUNTER MEDICATION Take 1 tablet by mouth daily. muscudine seed    . potassium chloride (K-DUR,KLOR-CON) 10 MEQ tablet Take 2 tablets (20 mEq total) by mouth daily. 30 tablet 11  . predniSONE (DELTASONE) 10 MG tablet Take 10 mg by mouth daily as needed (gout flare up).     . vitamin C (ASCORBIC ACID) 500 MG tablet Take 500 mg by mouth daily.      No current facility-administered medications for this visit.    Allergies:   Penicillins and Oxycodone   Social History:  The patient  reports that he quit smoking about 34 years ago. His smoking use included Cigarettes. He has a 12 pack-year smoking history. He has never used smokeless tobacco. He reports that he does not drink alcohol or use illicit drugs.   Family History:  The patient's  family history includes Heart disease in his father.    ROS:  Please see the history of present illness.   All other systems are reviewed and negative.    PHYSICAL EXAM: VS:  BP 110/70 mmHg  Pulse 74  Ht 5\' 9"  (1.753 m)  Wt 101.878 kg (224 lb 9.6 oz)  BMI 33.15 kg/m2 , BMI Body mass index is 33.15 kg/(m^2). GEN: Well nourished, well developed, in no acute distress HEENT:  normal Neck: no JVD, carotid bruits, or masses Cardiac: RRR; no murmurs, rubs, or gallops,no edema  Respiratory:  clear to auscultation bilaterally, normal work of breathing GI: soft, nontender, nondistended, + BS MS: no deformity or atrophy Skin: warm and dry  Neuro:  Strength and sensation are intact Psych: euthymic mood, full affect  EKG:  EKG is ordered today. The ekg ordered today shows atrial pacing, Qtc is 459   Recent Labs: 01/03/2015: ALT 17 01/11/2015: Pro B Natriuretic peptide (BNP) 383.0* 01/13/2015: B Natriuretic Peptide 280.7*;  TSH 4.015 01/14/2015: Hemoglobin 13.3; Platelets 156 02/14/2015: BUN 12; Creatinine 1.18; Magnesium 2.0; Potassium 3.9; Sodium 141    Lipid Panel  No results found for: CHOL, TRIG, HDL, CHOLHDL, VLDL, LDLCALC, LDLDIRECT   Wt Readings from Last 3 Encounters:  03/04/15 101.878 kg (224 lb 9.6 oz)  01/18/15 97.977 kg (216 lb)  01/11/15 100.608 kg (221 lb 12.8 oz)      Other studies Reviewed: Additional studies/ records that were reviewed today include: AF clinic notes, Dr Aquilla Hacker notes, echo  Review of the above records today demonstrates: LA size is 50 mm   ASSESSMENT AND PLAN:  1.  Persistent atrial fibrillation Doing very well with tikosyn Therapeutic strategies for afib including medicine and ablation were discussed in detail with the patient today. Risk, benefits, and alternatives to EP study and radiofrequency ablation for afib were also discussed in detail today.  Given his good control with tikosyn presently, I would advise that we continue medical therapy.  Should his AF burden increase, I would have a low threshold to consider ablation.  Cardiofit results with implications regarding importance of regular exercise and weight reduction were discussed today.  chads2vasc score is at least 4.  On eliquis  2. Obesity Weight reduction discussed  3. OSA CPAP encouraged  4. CHF euvolemic today Reduce lasix to 40mg  daily Daily  weights, 2 gram sodium restriction Consider repeat echo as he remains in sinus  5. SSS Normal pacemaker function See Pace Art report No changes today  Today, I have spent 40  minutes with the patient discussing atrial arrhythmias .  More than 50% of the visit time today was spent on this issue.    Current medicines are reviewed at length with the patient today.   The patient does not have concerns regarding his medicines.  The following changes were made today:  none   Follow-up: with Dr Caryl Comes as scheduled in July.  Dr Caryl Comes and Roderic Palau NP to follow AF going forward.  I will see as needed.   Army Fossa, MD  03/04/2015 9:03 AM     [facility] [address] [place]  [place] [address] [phone] (office) [phone] (fax)

## 2015-03-06 ENCOUNTER — Telehealth (HOSPITAL_COMMUNITY): Payer: Self-pay | Admitting: *Deleted

## 2015-03-06 NOTE — Telephone Encounter (Signed)
Pt called saying will need records sent to [facility] as they are assigning a cardiologist to him to be able to refill his tikosyn at [place].  They are refilling x1 month until can be seen by their cardiologist.  [facility] Crisoforo Oxford fax [phone].  Records sent per pt request.  Told pt to call back if any problems getting medication refilled and would send prescription to local pharmacy so that he does not run out of tikosyn. Patient verbalized understanding.

## 2015-03-18 ENCOUNTER — Telehealth: Payer: Self-pay | Admitting: Interventional Cardiology

## 2015-03-18 NOTE — Telephone Encounter (Signed)
[facility] [place]    Pt is calling about possibly getting samples of Tykosin. Offered to connect pt with medication clinic but pt insisted on sending message to [place]. Please call.

## 2015-03-19 NOTE — Telephone Encounter (Signed)
Called and spoke with patient.  He gets his medication through the [place] and they will not refill until he establishes with a cardiologist there next week.  He has enough Tikosyn to last until Saturday.  He just needs a 1 week supply to last until he gets to his appointment.  Informed him that it will be Thursday before I will have more Tikosyn at the office.  He will come by that afternoon to pick some up.

## 2015-03-21 ENCOUNTER — Other Ambulatory Visit (HOSPITAL_COMMUNITY): Payer: Self-pay | Admitting: *Deleted

## 2015-03-21 MED ORDER — DOFETILIDE 250 MCG PO CAPS: 250.0000 ug | ORAL_CAPSULE | Freq: Two times a day (BID) | ORAL | Status: DC

## 2015-03-29 ENCOUNTER — Ambulatory Visit (INDEPENDENT_AMBULATORY_CARE_PROVIDER_SITE_OTHER): Payer: Medicare Other | Admitting: Internal Medicine

## 2015-03-29 ENCOUNTER — Encounter: Payer: Self-pay | Admitting: Internal Medicine

## 2015-03-29 VITALS — BP 110/68 | HR 86 | Ht 69.0 in | Wt 225.2 lb

## 2015-03-29 DIAGNOSIS — I4819 Other persistent atrial fibrillation: Secondary | ICD-10-CM

## 2015-03-29 DIAGNOSIS — Z45018 Encounter for adjustment and management of other part of cardiac pacemaker: Secondary | ICD-10-CM

## 2015-03-29 DIAGNOSIS — E876 Hypokalemia: Secondary | ICD-10-CM | POA: Diagnosis not present

## 2015-03-29 DIAGNOSIS — I481 Persistent atrial fibrillation: Secondary | ICD-10-CM

## 2015-03-29 DIAGNOSIS — R001 Bradycardia, unspecified: Secondary | ICD-10-CM | POA: Diagnosis not present

## 2015-03-29 LAB — CUP PACEART INCLINIC DEVICE CHECK
Battery Remaining Longevity: 82 mo
Brady Statistic AP VP Percent: 0.05 %
Brady Statistic AP VS Percent: 87.85 %
Brady Statistic AS VS Percent: 12.1 %
Brady Statistic RV Percent Paced: 0.05 %
Date Time Interrogation Session: 20160701120218
Lead Channel Pacing Threshold Amplitude: 0.75 V
Lead Channel Pacing Threshold Pulse Width: 0.4 ms
Lead Channel Sensing Intrinsic Amplitude: 1.75 mV
Lead Channel Sensing Intrinsic Amplitude: 10 mV
Lead Channel Sensing Intrinsic Amplitude: 9 mV
Lead Channel Setting Pacing Amplitude: 2 V
Lead Channel Setting Pacing Amplitude: 2.5 V
Lead Channel Setting Pacing Pulse Width: 0.4 ms
MDC IDC MSMT BATTERY VOLTAGE: 3.01 V
MDC IDC MSMT LEADCHNL RA IMPEDANCE VALUE: 380 Ohm
MDC IDC MSMT LEADCHNL RA IMPEDANCE VALUE: 418 Ohm
MDC IDC MSMT LEADCHNL RA PACING THRESHOLD AMPLITUDE: 0.5 V
MDC IDC MSMT LEADCHNL RA SENSING INTR AMPL: 2 mV
MDC IDC MSMT LEADCHNL RV IMPEDANCE VALUE: 437 Ohm
MDC IDC MSMT LEADCHNL RV IMPEDANCE VALUE: 475 Ohm
MDC IDC MSMT LEADCHNL RV PACING THRESHOLD PULSEWIDTH: 0.4 ms
MDC IDC SET LEADCHNL RV SENSING SENSITIVITY: 4 mV
MDC IDC STAT BRADY AS VP PERCENT: 0 %
MDC IDC STAT BRADY RA PERCENT PACED: 87.9 %
Zone Setting Detection Interval: 350 ms
Zone Setting Detection Interval: 400 ms

## 2015-03-29 LAB — BASIC METABOLIC PANEL
BUN: 20 mg/dL (ref 6–23)
CO2: 34 meq/L — AB (ref 19–32)
Calcium: 9.5 mg/dL (ref 8.4–10.5)
Chloride: 101 mEq/L (ref 96–112)
Creatinine, Ser: 1.2 mg/dL (ref 0.40–1.50)
GFR: 62.45 mL/min (ref >60.00)
GLUCOSE: 82 mg/dL (ref 70–99)
Potassium: 3.6 mEq/L (ref 3.5–5.1)
Sodium: 142 mEq/L (ref 135–145)

## 2015-03-29 NOTE — Patient Instructions (Addendum)
Medication Instructions:  Your physician recommends that you continue on your current medications as directed. Please refer to the Current Medication list given to you today.  Labwork: BMET today  Testing/Procedures: None ordered  Follow-Up: Remote monitoring is used to monitor your pacemaker from home. This monitoring reduces the number of office visits required to check your device to one time per year. It allows [place] to keep an eye on the functioning of your device to ensure it is working properly. You are scheduled for a device check from home on 07/01/2015. You may send your transmission at any time that day. If you have a wireless device, the transmission will be sent automatically. After your physician reviews your transmission, you will receive a postcard with your next transmission date.  Your physician recommends that you schedule a follow-up appointment in: 3 months with Roderic Palau, NP.  Your physician recommends that you schedule a follow-up appointment in: 12 months with Dr.Klein   Any Other Special Instructions Will Be Listed Below (If Applicable). Thank you for choosing [facility]!!

## 2015-03-29 NOTE — Progress Notes (Signed)
skf      Patient Care Team: [name], MD as PCP - General (Internal Medicine) Jettie Booze, MD (Cardiology)   HPI  Don Jacobs is a 79 y.o. male Seen following pacemaker implantation 5/14 for paroxysmal atrial fibrillation with a rapid rate associated with symptomatic post termination pausing and presyncope. He also has underlying bradycardia.  His CHADS-VASc score is 4, age-47, hypertension-1, and vascular disease- 1.   He is currently on apixaban.  He previously had taking amiodarone and flecainide he did not tolerate. He was started most recently on dofetilide-4/16 which she has tolerated so far. He saw Dr. Greggory Brandy 6/16 with the decision to continue medical therapy with a low threshold for proceeding with catheter ablation  He has been following up in the [facility]. fib clinic. They have a weight loss program ongoing. He has lost 40 pounds.   Past Medical History  Diagnosis Date  . Hypertension   . Persistent atrial fibrillation   . Sleep apnea   . Gout   . Hypothyroidism   . Allergic rhinitis   . ED (erectile dysfunction)   . BPH (benign prostatic hyperplasia)   . Hearing loss     uses hearing a cyst  . Hx of migraines   . Prostatitis, chronic   . Spondylolisthesis 07/2010  . Seborrheic keratosis   . Aortic aneurysm, thoracic 05/25/2011  . Sick sinus syndrome     a. s/p PPM   . Chronic systolic CHF (congestive heart failure)     Past Surgical History  Procedure Laterality Date  . Back surgery    . L5 selective nerve root block      Dr Nelva Bush  . Foot surgery Right ~ 2007    "reconstruction" (02/17/2013)  . Shoulder open rotator cuff repair Right 09/2007; 2013  . Repair peroneal tendons ankle Right 10/2003  . Knee arthroscopy Left ~ 2007  . Anterior cervical decomp/discectomy fusion  10/2000    "C4, 5, 6 w/fusion" (02/17/2013)  . Lumbar spine surgery  2012    "bone graft, Dr. [name]" (02/17/2013)  . Insert / replace / remove pacemaker  02/17/2013  . Cardiac  catheterization  2000's    "once" (02/17/2013)  . Cardioversion N/A 04/09/2014    Procedure: CARDIOVERSION;  Surgeon: Lelon Perla, MD;  Location: [facility] ENDOSCOPY;  Service: Cardiovascular;  Laterality: N/A;  . Permanent pacemaker insertion N/A 02/17/2013    Procedure: PERMANENT PACEMAKER INSERTION;  Surgeon: Deboraha Sprang, MD;  Location: [facility] CATH LAB;  Service: Cardiovascular;  Laterality: N/A;  . Cardioversion N/A 01/17/2015    Procedure: CARDIOVERSION;  Surgeon: Larey Dresser, MD;  Location: [place];  Service: Cardiovascular;  Laterality: N/A;    Current Outpatient Prescriptions  Medication Sig Dispense Refill  . allopurinol (ZYLOPRIM) 300 MG tablet Take 300 mg by mouth at bedtime.     Marland Kitchen apixaban (ELIQUIS) 5 MG TABS tablet Take 1 tablet (5 mg total) by mouth 2 (two) times daily. 180 tablet 3  . benzonatate (TESSALON) 100 MG capsule Take 1 capsule (100 mg total) by mouth 3 (three) times daily as needed for cough. 90 capsule 1  . Calcium Carbonate (CALTRATE 600 PO) Take 600 mg by mouth at bedtime.     . cholecalciferol (VITAMIN D) 1000 UNITS tablet Take 1,000 Units by mouth at bedtime.    . colchicine 0.6 MG tablet Take 0.6 mg by mouth daily as needed (gout).     . cyanocobalamin 500 MCG tablet Take 500 mcg by  mouth daily.    Marland Kitchen dofetilide (TIKOSYN) 250 MCG capsule Take 1 capsule (250 mcg total) by mouth 2 (two) times daily. 60 capsule 6  . doxazosin (CARDURA) 4 MG tablet Take 1 tablet (4 mg total) by mouth at bedtime. 30 tablet 11  . furosemide (LASIX) 40 MG tablet Take 1 tablet (40 mg total) by mouth daily. 90 tablet 3  . guaiFENesin (ROBITUSSIN) 100 MG/5ML SOLN Take 10 mLs by mouth every 4 (four) hours as needed for cough or to loosen phlegm.    Marland Kitchen guaiFENesin-codeine (ROBITUSSIN AC) 100-10 MG/5ML syrup Take 5 mLs by mouth 3 (three) times daily as needed for cough.    . latanoprost (XALATAN) 0.005 % ophthalmic solution Place 1 drop into both eyes at bedtime.    Marland Kitchen levothyroxine  (SYNTHROID, LEVOTHROID) 112 MCG tablet Take 1 tablet (112 mcg total) by mouth daily before breakfast. 30 tablet 3  . loratadine (CLARITIN) 10 MG tablet Take 5 mg by mouth 2 (two) times daily as needed for allergies or rhinitis.     . Magnesium 400 MG CAPS Take 400 mg by mouth 2 (two) times daily.    . metoprolol tartrate (LOPRESSOR) 25 MG tablet Take 1 tablet (25 mg total) by mouth 2 (two) times daily. 60 tablet 1  . Misc Natural Products (SAW PALMETTO) CAPS Take 1 capsule by mouth daily.    Marland Kitchen OVER THE COUNTER MEDICATION Take 1 tablet by mouth daily. muscudine seed    . potassium chloride (K-DUR,KLOR-CON) 10 MEQ tablet Take 2 tablets (20 mEq total) by mouth daily. 30 tablet 11  . predniSONE (DELTASONE) 10 MG tablet Take 10 mg by mouth daily as needed (gout flare up).     . vitamin C (ASCORBIC ACID) 500 MG tablet Take 500 mg by mouth daily.      No current facility-administered medications for this visit.    Allergies  Allergen Reactions  . Penicillins Shortness Of Breath and Rash    Ended up at ER after using  . Oxycodone Other (See Comments)    Altered mental status    Review of Systems negative except from HPI and PMH  Physical Exam BP 110/68 mmHg  Pulse 86  Ht 5\' 9"  (1.753 m)  Wt 225 lb 3.2 oz (102.15 kg)  BMI 33.24 kg/m2 Well developed and well nourished in no acute distress HENT normal E scleral and icterus clear Neck Supple  carotids brisk and full Clear to ausculation Regular rate and rhythm, no murmurs gallops or rub Soft with active bowel sounds No clubbing cyanosis no Edema Alert and oriented, grossly normal motor and sensory function Skin Warm and Dry  ECG was ordered today demonstrating   A atrial paced rhythm at 86 there is a intervals 24/09/30 with a QTC of 46   ssessment and  Plan  Sinus node dysfunction  Atrial fibrillation-paroxysmal  HFpEF  Obstructive sleep apnea-treated  Pacemaker-Medtronic The patient's device was interrogated.  The  information was reviewed. No changes were made in the programming.     He is doing much better in sinus rhythm. He is euvolemic. He continues to work on weight loss.    he appears to be tolerating his dofetilide;  his potassium was borderline low at the last 2 visits in the 3.5-3.9 range. We will check his metabolic profile today. QTc was normal.

## 2015-04-09 ENCOUNTER — Telehealth (HOSPITAL_COMMUNITY): Payer: Self-pay | Admitting: *Deleted

## 2015-04-09 NOTE — Telephone Encounter (Signed)
Patient called this morning c/o having 5 dizzy spells this weekend lasting around 30 seconds each.  He is going to send a transmission so we can make sure his PPM does not show any irregularities.  He states he has discussed these issues with Butch Penny on several occasions    We received his transmission and it is good.  No episodes documented   I have left the patient a message to continue on his medications as directed and keep hydrated.  He may call the clinic if needed

## 2015-04-26 ENCOUNTER — Ambulatory Visit (HOSPITAL_COMMUNITY)
Admission: RE | Admit: 2015-04-26 | Discharge: 2015-04-26 | Disposition: A | Discharge status: Home / Self Care | Payer: Medicare Other | Source: Ambulatory Visit | Attending: Nurse Practitioner | Admitting: Nurse Practitioner

## 2015-04-26 DIAGNOSIS — I481 Persistent atrial fibrillation: Secondary | ICD-10-CM

## 2015-04-26 DIAGNOSIS — Z79899 Other long term (current) drug therapy: Secondary | ICD-10-CM | POA: Diagnosis not present

## 2015-04-26 DIAGNOSIS — I4891 Unspecified atrial fibrillation: Secondary | ICD-10-CM | POA: Diagnosis not present

## 2015-04-26 LAB — BASIC METABOLIC PANEL
Anion gap: 10 (ref 5–15)
BUN: 20 mg/dL (ref 6–20)
CO2: 33 mmol/L — ABNORMAL HIGH (ref 22–32)
Calcium: 9.3 mg/dL (ref 8.9–10.3)
Chloride: 98 mmol/L — ABNORMAL LOW (ref 101–111)
Creatinine, Ser: 1.4 mg/dL — ABNORMAL HIGH (ref 0.61–1.24)
GFR calc Af Amer: 55 mL/min — ABNORMAL LOW (ref >60)
GFR calc non Af Amer: 47 mL/min — ABNORMAL LOW (ref >60)
GLUCOSE: 120 mg/dL — AB (ref 65–99)
Potassium: 3.8 mmol/L (ref 3.5–5.1)
Sodium: 141 mmol/L (ref 135–145)

## 2015-04-29 ENCOUNTER — Other Ambulatory Visit (HOSPITAL_COMMUNITY): Payer: Self-pay | Admitting: *Deleted

## 2015-04-29 MED ORDER — POTASSIUM CHLORIDE CRYS ER 10 MEQ PO TBCR: 30.0000 meq | EXTENDED_RELEASE_TABLET | Freq: Every day | ORAL | Status: DC

## 2015-05-22 DIAGNOSIS — L570 Actinic keratosis: Secondary | ICD-10-CM | POA: Diagnosis not present

## 2015-05-22 DIAGNOSIS — L821 Other seborrheic keratosis: Secondary | ICD-10-CM | POA: Diagnosis not present

## 2015-05-27 ENCOUNTER — Ambulatory Visit (HOSPITAL_COMMUNITY)
Admission: RE | Admit: 2015-05-27 | Discharge: 2015-05-27 | Disposition: A | Discharge status: Home / Self Care | Payer: Medicare Other | Source: Ambulatory Visit | Attending: Nurse Practitioner | Admitting: Nurse Practitioner

## 2015-05-27 DIAGNOSIS — I480 Paroxysmal atrial fibrillation: Secondary | ICD-10-CM

## 2015-05-27 DIAGNOSIS — Z79899 Other long term (current) drug therapy: Secondary | ICD-10-CM | POA: Diagnosis not present

## 2015-05-27 DIAGNOSIS — R531 Weakness: Secondary | ICD-10-CM | POA: Insufficient documentation

## 2015-05-27 DIAGNOSIS — Z5181 Encounter for therapeutic drug level monitoring: Secondary | ICD-10-CM | POA: Diagnosis not present

## 2015-05-27 DIAGNOSIS — R42 Dizziness and giddiness: Secondary | ICD-10-CM | POA: Insufficient documentation

## 2015-05-27 LAB — BASIC METABOLIC PANEL
ANION GAP: 8 (ref 5–15)
BUN: 13 mg/dL (ref 6–20)
CALCIUM: 8.9 mg/dL (ref 8.9–10.3)
CO2: 32 mmol/L (ref 22–32)
CREATININE: 1.55 mg/dL — AB (ref 0.61–1.24)
Chloride: 99 mmol/L — ABNORMAL LOW (ref 101–111)
GFR calc non Af Amer: 42 mL/min — ABNORMAL LOW (ref >60)
GFR, EST AFRICAN AMERICAN: 48 mL/min — AB (ref >60)
Glucose, Bld: 135 mg/dL — ABNORMAL HIGH (ref 65–99)
Potassium: 3.7 mmol/L (ref 3.5–5.1)
Sodium: 139 mmol/L (ref 135–145)

## 2015-05-27 LAB — MAGNESIUM: MAGNESIUM: 1.6 mg/dL — AB (ref 1.7–2.4)
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
## 2015-05-28 DIAGNOSIS — R42 Dizziness and giddiness: Secondary | ICD-10-CM | POA: Diagnosis not present

## 2015-05-30 ENCOUNTER — Inpatient Hospital Stay (HOSPITAL_COMMUNITY): Admission: RE | Admit: 2015-05-30 | Payer: Medicare Other | Source: Ambulatory Visit | Admitting: Nurse Practitioner

## 2015-06-04 ENCOUNTER — Ambulatory Visit (INDEPENDENT_AMBULATORY_CARE_PROVIDER_SITE_OTHER): Payer: Medicare Other | Admitting: Neurology

## 2015-06-04 ENCOUNTER — Encounter: Payer: Self-pay | Admitting: Neurology

## 2015-06-04 VITALS — BP 115/76 | HR 80 | Ht 69.0 in | Wt 229.5 lb

## 2015-06-04 DIAGNOSIS — R42 Dizziness and giddiness: Secondary | ICD-10-CM | POA: Diagnosis not present

## 2015-06-04 HISTORY — DX: Dizziness and giddiness: R42

## 2015-06-04 NOTE — Patient Instructions (Signed)
   We will check CT of the head and check an EEG study. We will discuss the use of a seizure medication at that point.
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
## 2015-06-05 ENCOUNTER — Ambulatory Visit (INDEPENDENT_AMBULATORY_CARE_PROVIDER_SITE_OTHER): Payer: Medicare Other | Admitting: Neurology

## 2015-06-05 ENCOUNTER — Telehealth: Payer: Self-pay | Admitting: Neurology

## 2015-06-05 DIAGNOSIS — R4182 Altered mental status, unspecified: Secondary | ICD-10-CM | POA: Diagnosis not present

## 2015-06-05 DIAGNOSIS — R42 Dizziness and giddiness: Secondary | ICD-10-CM

## 2015-06-05 NOTE — Procedures (Signed)
    History:  Don Jacobs is a 79 year old gentleman with a history of a subdural hematoma following a fall in 2004. Within 6-8 months following this, he began having episodes of abdominal sensations, spreading to the head associated with dizziness, and episodes of unusual odors and taste sensations. The patient is being evaluated for possible seizures.  This is a routine EEG. No skull defects are noted. Medications include allopurinol, Eliquis, Tessalon, calcium supplementation, vitamin D, colchicine, Tikosyn, Lasix, Cardura, Synthroid, Claritin, magnesium supplementation, potassium supplementation, prednisone, and vitamin D.   EEG classification: Normal awake  Description of the recording: The background rhythms of this recording consists of a fairly well modulated medium amplitude alpha rhythm of 8 Hz that is reactive to eye opening and closure. As the record progresses, the patient appears to remain in the waking state throughout the recording. Photic stimulation was not performed. Hyperventilation was performed, resulting in a minimal buildup of the background rhythm activities without significant slowing seen. At no time during the recording does there appear to be evidence of spike or spike wave discharges or evidence of focal slowing. EKG monitor shows no evidence of cardiac rhythm abnormalities with a heart rate of 60.  Impression: This is a normal EEG recording in the waking state. No evidence of ictal or interictal discharges are seen.

## 2015-06-05 NOTE — Telephone Encounter (Signed)
I called patient. The EEG study was unremarkable. We may consider an empiric trial on Keppra. The purpose of the medication is a help confirm the diagnosis of seizures. The episodes are resulting in minimal symptomatology, it may be re-small not to treat the episodes at all. The patient will contact me if he wants to go on medication.

## 2015-06-06 NOTE — Telephone Encounter (Signed)
I called the patient and left a message explaining Dr. Jannifer Franklin' note below. I explained that no seizures were seen during his EEG study and that Dr. Jannifer Franklin mentioned possibly starting him on Keppra to help diagnose his episodes as seizures. I explained that if his symptoms are not too bad, it is reasonable not to treat the episodes. I asked that he call us if he is interested in starting Don Jacobs or if he has any further questions.

## 2015-06-06 NOTE — Telephone Encounter (Addendum)
Pt called and did not understand the message by Dr. Jannifer Franklin. Pt said he will be away from the house all day and will be back around 5-6pm this evening. Please call his house number and message may be left if need be . Home [phone]

## 2015-06-12 ENCOUNTER — Ambulatory Visit (HOSPITAL_BASED_OUTPATIENT_CLINIC_OR_DEPARTMENT_OTHER)
Admission: RE | Admit: 2015-06-12 | Discharge: 2015-06-12 | Disposition: A | Discharge status: Home / Self Care | Payer: Medicare Other | Source: Ambulatory Visit | Attending: Neurology | Admitting: Neurology

## 2015-06-12 ENCOUNTER — Ambulatory Visit (HOSPITAL_COMMUNITY)
Admission: RE | Admit: 2015-06-12 | Discharge: 2015-06-12 | Disposition: A | Discharge status: Home / Self Care | Payer: Medicare Other | Source: Ambulatory Visit | Attending: Nurse Practitioner | Admitting: Nurse Practitioner

## 2015-06-12 ENCOUNTER — Other Ambulatory Visit: Payer: Medicare Other

## 2015-06-12 DIAGNOSIS — I480 Paroxysmal atrial fibrillation: Secondary | ICD-10-CM | POA: Insufficient documentation

## 2015-06-12 DIAGNOSIS — R42 Dizziness and giddiness: Secondary | ICD-10-CM

## 2015-06-12 LAB — MAGNESIUM: Magnesium: 1.9 mg/dL (ref 1.7–2.4)

## 2015-06-12 LAB — BASIC METABOLIC PANEL
Anion gap: 7 (ref 5–15)
BUN: 16 mg/dL (ref 6–20)
CO2: 31 mmol/L (ref 22–32)
CREATININE: 1.27 mg/dL — AB (ref 0.61–1.24)
Calcium: 9.3 mg/dL (ref 8.9–10.3)
Chloride: 101 mmol/L (ref 101–111)
GFR calc Af Amer: >60 mL/min (ref >60)
GFR, EST NON AFRICAN AMERICAN: 53 mL/min — AB (ref >60)
GLUCOSE: 112 mg/dL — AB (ref 65–99)
POTASSIUM: 3.5 mmol/L (ref 3.5–5.1)
Sodium: 139 mmol/L (ref 135–145)

## 2015-06-13 ENCOUNTER — Ambulatory Visit: Payer: Medicare Other | Admitting: Neurology

## 2015-06-13 ENCOUNTER — Other Ambulatory Visit (HOSPITAL_COMMUNITY): Payer: Self-pay | Admitting: *Deleted

## 2015-06-13 MED ORDER — POTASSIUM CHLORIDE CRYS ER 20 MEQ PO TBCR: EXTENDED_RELEASE_TABLET | ORAL | Status: DC

## 2015-06-14 ENCOUNTER — Telehealth: Payer: Self-pay | Admitting: Neurology

## 2015-06-14 MED ORDER — LEVETIRACETAM 500 MG PO TABS: ORAL_TABLET | ORAL | Status: DC

## 2015-06-14 NOTE — Telephone Encounter (Signed)
Pt returned phone call from Dr. Jannifer Franklin

## 2015-06-14 NOTE — Telephone Encounter (Signed)
Will fwd call to Dr. Jannifer Franklin, since he just called the patient.

## 2015-06-14 NOTE — Addendum Note (Signed)
Addended by: Margette Fast on: 06/14/2015 01:54 PM   Modules accepted: Orders

## 2015-06-14 NOTE — Telephone Encounter (Signed)
  I called the patient. The CT of the head does show right anterior temporal encephalomalacia. This is the likely source of seizures. I would consider the use of keppra. The patient is to call if he is ok with this and I will call in a RX.  CT head 06/14/15:  IMPRESSION:  Abnormal CT head (without) demonstrating: 1. Right anterior temporal encephalomalcia may be related to prior trauma, infection or ischemic infarction. 2. Mild perisylvian atrophy.  3. Mild periventricular and subcortical chronic small vessel ischemic disease.  4. There are 2 small punctate foci of calcifications in the right cerebellum.  5. No acute findings.

## 2015-06-14 NOTE — Telephone Encounter (Signed)
I called patient. I will call in a prescription for the [place] and get started on this.

## 2015-06-20 ENCOUNTER — Ambulatory Visit (HOSPITAL_COMMUNITY)
Admission: RE | Admit: 2015-06-20 | Discharge: 2015-06-20 | Disposition: A | Discharge status: Home / Self Care | Payer: Medicare Other | Source: Ambulatory Visit | Attending: Nurse Practitioner | Admitting: Nurse Practitioner

## 2015-06-20 DIAGNOSIS — M1711 Unilateral primary osteoarthritis, right knee: Secondary | ICD-10-CM | POA: Diagnosis not present

## 2015-06-20 DIAGNOSIS — M2241 Chondromalacia patellae, right knee: Secondary | ICD-10-CM | POA: Diagnosis not present

## 2015-06-20 DIAGNOSIS — I481 Persistent atrial fibrillation: Secondary | ICD-10-CM | POA: Diagnosis not present

## 2015-06-20 DIAGNOSIS — I480 Paroxysmal atrial fibrillation: Secondary | ICD-10-CM | POA: Diagnosis not present

## 2015-06-20 LAB — BASIC METABOLIC PANEL
Anion gap: 9 (ref 5–15)
BUN: 18 mg/dL (ref 6–20)
CALCIUM: 9.4 mg/dL (ref 8.9–10.3)
CO2: 31 mmol/L (ref 22–32)
CREATININE: 1.53 mg/dL — AB (ref 0.61–1.24)
Chloride: 96 mmol/L — ABNORMAL LOW (ref 101–111)
GFR calc Af Amer: 49 mL/min — ABNORMAL LOW (ref >60)
GFR, EST NON AFRICAN AMERICAN: 42 mL/min — AB (ref >60)
GLUCOSE: 187 mg/dL — AB (ref 65–99)
Potassium: 4.5 mmol/L (ref 3.5–5.1)
SODIUM: 136 mmol/L (ref 135–145)

## 2015-06-25 ENCOUNTER — Ambulatory Visit (INDEPENDENT_AMBULATORY_CARE_PROVIDER_SITE_OTHER): Payer: Medicare Other | Admitting: Interventional Cardiology

## 2015-06-25 ENCOUNTER — Encounter: Payer: Self-pay | Admitting: Interventional Cardiology

## 2015-06-25 VITALS — BP 100/60 | HR 65 | Wt 227.0 lb

## 2015-06-25 DIAGNOSIS — R931 Abnormal findings on diagnostic imaging of heart and coronary circulation: Secondary | ICD-10-CM

## 2015-06-25 DIAGNOSIS — I4891 Unspecified atrial fibrillation: Secondary | ICD-10-CM | POA: Diagnosis not present

## 2015-06-25 DIAGNOSIS — Z950 Presence of cardiac pacemaker: Secondary | ICD-10-CM

## 2015-06-25 NOTE — Patient Instructions (Signed)
Medication Instructions:  Your physician recommends that you continue on your current medications as directed. Please refer to the Current Medication list given to you today.   Labwork: None ordered  Testing/Procedures: None ordered  Follow-Up: Your physician wants you to follow-up in: 1 year with Dr.Varanasi You will receive a reminder letter in the mail two months in advance. If you don't receive a letter, please call our office to schedule the follow-up appointment.   Any Other Special Instructions Will Be Listed Below (If Applicable).

## 2015-06-25 NOTE — Progress Notes (Signed)
Patient ID: Don Jacobs, male   DOB: 03-21-1936, 79 y.o.   MRN: [id number]     Cardiology Office Note   Date:  06/27/2015   ID:  Don Jacobs, DOB 04/27/1936, MRN [id number]  PCP:  Henrine Screws, MD    No chief complaint on file. f/u AFib   Wt Readings from Last 3 Encounters:  06/25/15 227 lb (102.967 kg)  06/04/15 229 lb 8 oz (104.101 kg)  03/29/15 225 lb 3.2 oz (102.15 kg)       History of Present Illness: Don Jacobs is a 79 y.o. male  who has had symptomatic atrial fibrillation and tachybradycardia syndrome. He has undergone pacemaker placement. His history was complicated by a fall and head trauma including a subdural hematoma a few years ago. He has been cleared for anticoagulation due to the atrial fibrillation.  He had been in atrial fibrillation, but due to his symptoms he was referred to electrophysiology. Dofetilide has been very helpful in maintaining sinus rhythm. He feels much better. He denies any palpitations. He has not had chest pain. He has some issues with joint pains and decreased balance. Other than that, he feels well.    Past Medical History  Diagnosis Date  . Hypertension   . Persistent atrial fibrillation   . Sleep apnea   . Gout   . Hypothyroidism   . Allergic rhinitis   . ED (erectile dysfunction)   . BPH (benign prostatic hyperplasia)   . Hearing loss     uses hearing a cyst  . Hx of migraines   . Prostatitis, chronic   . Spondylolisthesis 07/2010  . Seborrheic keratosis   . Aortic aneurysm, thoracic 05/25/2011  . Sick sinus syndrome     a. s/p PPM   . Chronic systolic CHF (congestive heart failure)   . Dizziness and giddiness 06/04/2015    Past Surgical History  Procedure Laterality Date  . Back surgery    . L5 selective nerve root block      Dr Nelva Bush  . Foot surgery Right ~ 2007    "reconstruction" (02/17/2013)  . Shoulder open rotator cuff repair Right 09/2007; 2013  . Repair peroneal tendons ankle Right 10/2003  .  Knee arthroscopy Left ~ 2007  . Anterior cervical decomp/discectomy fusion  10/2000    "C4, 5, 6 w/fusion" (02/17/2013)  . Lumbar spine surgery  2012    "bone graft, Dr. [name]" (02/17/2013)  . Insert / replace / remove pacemaker  02/17/2013  . Cardiac catheterization  2000's    "once" (02/17/2013)  . Cardioversion N/A 04/09/2014    Procedure: CARDIOVERSION;  Surgeon: Lelon Perla, MD;  Location: [facility] ENDOSCOPY;  Service: Cardiovascular;  Laterality: N/A;  . Permanent pacemaker insertion N/A 02/17/2013    Procedure: PERMANENT PACEMAKER INSERTION;  Surgeon: Deboraha Sprang, MD;  Location: [facility] CATH LAB;  Service: Cardiovascular;  Laterality: N/A;  . Cardioversion N/A 01/17/2015    Procedure: CARDIOVERSION;  Surgeon: Larey Dresser, MD;  Location: [place];  Service: Cardiovascular;  Laterality: N/A;     Current Outpatient Prescriptions  Medication Sig Dispense Refill  . allopurinol (ZYLOPRIM) 300 MG tablet Take 300 mg by mouth at bedtime.     Marland Kitchen apixaban (ELIQUIS) 5 MG TABS tablet Take 1 tablet (5 mg total) by mouth 2 (two) times daily. 180 tablet 3  . benzonatate (TESSALON) 100 MG capsule Take 1 capsule (100 mg total) by mouth 3 (three) times daily as needed for cough. 90 capsule 1  .  Calcium Carbonate (CALTRATE 600 PO) Take 600 mg by mouth at bedtime.     . cholecalciferol (VITAMIN D) 1000 UNITS tablet Take 1,000 Units by mouth at bedtime.    . colchicine 0.6 MG tablet Take 0.6 mg by mouth daily as needed (gout).     . cyanocobalamin 500 MCG tablet Take 500 mcg by mouth daily.    Marland Kitchen dofetilide (TIKOSYN) 250 MCG capsule Take 1 capsule (250 mcg total) by mouth 2 (two) times daily. 60 capsule 6  . doxazosin (CARDURA) 4 MG tablet Take 1 tablet (4 mg total) by mouth at bedtime. 30 tablet 11  . furosemide (LASIX) 40 MG tablet Take 1 tablet (40 mg total) by mouth daily. 90 tablet 3  . guaiFENesin (ROBITUSSIN) 100 MG/5ML SOLN Take 10 mLs by mouth every 4 (four) hours as needed for cough or to  loosen phlegm.    Marland Kitchen guaiFENesin-codeine (ROBITUSSIN AC) 100-10 MG/5ML syrup Take 5 mLs by mouth 3 (three) times daily as needed for cough.    . levothyroxine (SYNTHROID, LEVOTHROID) 112 MCG tablet Take 1 tablet (112 mcg total) by mouth daily before breakfast. 30 tablet 3  . loratadine (CLARITIN) 10 MG tablet Take 5 mg by mouth 2 (two) times daily as needed for allergies or rhinitis.     . Magnesium 400 MG CAPS Take 400 mg by mouth 2 (two) times daily.    . Misc Natural Products (SAW PALMETTO) CAPS Take 1 capsule by mouth daily.    Marland Kitchen OVER THE COUNTER MEDICATION Take 1 tablet by mouth daily. muscudine seed    . potassium chloride (K-DUR,KLOR-CON) 20 MEQ tablet Take 2 tablets (37meq) in the AM and 1 tablet (74meq) in the PM 90 tablet 3  . predniSONE (DELTASONE) 10 MG tablet Take 10 mg by mouth daily as needed (gout flare up).     . vitamin C (ASCORBIC ACID) 500 MG tablet Take 500 mg by mouth daily.      No current facility-administered medications for this visit.    Allergies:   Penicillins and Oxycodone    Social History:  The patient  reports that he quit smoking about 34 years ago. His smoking use included Cigarettes. He has a 12 pack-year smoking history. He has never used smokeless tobacco. He reports that he does not drink alcohol or use illicit drugs.   Family History:  The patient's family history includes Alcohol abuse in his maternal grandfather and maternal grandmother; Emphysema in his mother; Heart attack in his father and paternal grandfather; Heart disease in his father and paternal grandfather; Rheum arthritis in his brother.    ROS:  Please see the history of present illness.   Otherwise, review of systems are positive for joint pains.   All other systems are reviewed and negative.    PHYSICAL EXAM: VS:  BP 100/60 mmHg  Pulse 65  Wt 227 lb (102.967 kg)  SpO2 97% , BMI Body mass index is 33.51 kg/(m^2). GEN: Well nourished, well developed, in no acute distress HEENT:  normal Neck: no JVD, carotid bruits, or masses Cardiac: RRR; no murmurs, rubs, or gallops,no edema  Respiratory:  clear to auscultation bilaterally, normal work of breathing GI: soft, nontender, nondistended, + BS MS: no deformity or atrophy Skin: warm and dry, no rash Neuro:  Strength and sensation are intact Psych: euthymic mood, full affect      Recent Labs: 01/03/2015: ALT 17 01/11/2015: Pro B Natriuretic peptide (BNP) 383.0* 01/13/2015: B Natriuretic Peptide 280.7*; TSH 4.015 01/14/2015: Hemoglobin  13.3; Platelets 156 06/12/2015: Magnesium 1.9 06/20/2015: BUN 18; Creatinine, Ser 1.53*; Potassium 4.5; Sodium 136   Lipid Panel No results found for: CHOL, TRIG, HDL, CHOLHDL, VLDL, LDLCALC, LDLDIRECT   Other studies Reviewed: Additional studies/ records that were reviewed today with results demonstrating: Electrophysiology note. Echo and cath results noted below.   ASSESSMENT AND PLAN:  1. Atrial fibrillation: Maintaining sinus rhythm on dofetilide. Continue Copaxone band for stroke prevention. He is doing well. He also follows up with electrophysiology.  2. Pacemaker: Continue routine pacemaker checks. 3. Abnormal echo: EF 40-45%. He has had a negative ischemic workup in the past including a negative cath in May 2010.  He is had a negative nuclear study. No angina at this time. Continue to follow. If his ejection fraction drops, would have to consider ACE inhibitor. At this point, he is feeling well for the first time in a long time on a routine visit. I'm hesitant to make any changes at this time.   Current medicines are reviewed at length with the patient today.  The patient concerns regarding his medicines were addressed.  The following changes have been made:  No change  Labs/ tests ordered today include:  No orders of the defined types were placed in this encounter.    Recommend 150 minutes/week of aerobic exercise Low fat, low carb, high fiber diet  recommended  Disposition:   FU in one year   Teresita Madura., MD  06/27/2015 10:04 AM    [facility] [address] [address], [place], [place]  [address] Phone: [phone]; Fax: [phone]

## 2015-06-27 DIAGNOSIS — R931 Abnormal findings on diagnostic imaging of heart and coronary circulation: Secondary | ICD-10-CM | POA: Insufficient documentation

## 2015-06-27 DIAGNOSIS — Z950 Presence of cardiac pacemaker: Secondary | ICD-10-CM | POA: Insufficient documentation

## 2015-07-01 ENCOUNTER — Ambulatory Visit (INDEPENDENT_AMBULATORY_CARE_PROVIDER_SITE_OTHER): Payer: Medicare Other | Admitting: *Deleted

## 2015-07-01 ENCOUNTER — Telehealth: Payer: Self-pay | Admitting: Cardiology

## 2015-07-01 DIAGNOSIS — I4891 Unspecified atrial fibrillation: Secondary | ICD-10-CM | POA: Diagnosis not present

## 2015-07-01 NOTE — Telephone Encounter (Signed)
LMOVM reminding pt to send remote transmission.   

## 2015-07-01 NOTE — Progress Notes (Signed)
Remote pacemaker transmission.   

## 2015-07-02 ENCOUNTER — Encounter (HOSPITAL_COMMUNITY): Payer: Self-pay | Admitting: Nurse Practitioner

## 2015-07-02 ENCOUNTER — Ambulatory Visit (HOSPITAL_COMMUNITY)
Admission: RE | Admit: 2015-07-02 | Discharge: 2015-07-02 | Disposition: A | Discharge status: Home / Self Care | Payer: Medicare Other | Source: Ambulatory Visit | Attending: Nurse Practitioner | Admitting: Nurse Practitioner

## 2015-07-02 VITALS — BP 116/74 | HR 76 | Ht 69.0 in | Wt 228.6 lb

## 2015-07-02 DIAGNOSIS — Z7902 Long term (current) use of antithrombotics/antiplatelets: Secondary | ICD-10-CM | POA: Diagnosis not present

## 2015-07-02 DIAGNOSIS — E039 Hypothyroidism, unspecified: Secondary | ICD-10-CM | POA: Insufficient documentation

## 2015-07-02 DIAGNOSIS — G4730 Sleep apnea, unspecified: Secondary | ICD-10-CM | POA: Diagnosis not present

## 2015-07-02 DIAGNOSIS — Z79899 Other long term (current) drug therapy: Secondary | ICD-10-CM | POA: Diagnosis not present

## 2015-07-02 DIAGNOSIS — Z950 Presence of cardiac pacemaker: Secondary | ICD-10-CM | POA: Diagnosis not present

## 2015-07-02 DIAGNOSIS — Z885 Allergy status to narcotic agent status: Secondary | ICD-10-CM | POA: Insufficient documentation

## 2015-07-02 DIAGNOSIS — I481 Persistent atrial fibrillation: Secondary | ICD-10-CM | POA: Diagnosis not present

## 2015-07-02 DIAGNOSIS — I509 Heart failure, unspecified: Secondary | ICD-10-CM | POA: Diagnosis not present

## 2015-07-02 DIAGNOSIS — Z8249 Family history of ischemic heart disease and other diseases of the circulatory system: Secondary | ICD-10-CM | POA: Insufficient documentation

## 2015-07-02 DIAGNOSIS — I10 Essential (primary) hypertension: Secondary | ICD-10-CM | POA: Insufficient documentation

## 2015-07-02 DIAGNOSIS — I4819 Other persistent atrial fibrillation: Secondary | ICD-10-CM

## 2015-07-02 DIAGNOSIS — I495 Sick sinus syndrome: Secondary | ICD-10-CM | POA: Insufficient documentation

## 2015-07-02 DIAGNOSIS — Z880 Allergy status to penicillin: Secondary | ICD-10-CM | POA: Insufficient documentation

## 2015-07-02 DIAGNOSIS — Z87891 Personal history of nicotine dependence: Secondary | ICD-10-CM | POA: Insufficient documentation

## 2015-07-02 DIAGNOSIS — I4891 Unspecified atrial fibrillation: Secondary | ICD-10-CM | POA: Diagnosis not present

## 2015-07-02 LAB — BASIC METABOLIC PANEL
Anion gap: 6 (ref 5–15)
BUN: 17 mg/dL (ref 6–20)
CHLORIDE: 102 mmol/L (ref 101–111)
CO2: 32 mmol/L (ref 22–32)
Calcium: 9.2 mg/dL (ref 8.9–10.3)
Creatinine, Ser: 1.32 mg/dL — ABNORMAL HIGH (ref 0.61–1.24)
GFR calc Af Amer: 59 mL/min — ABNORMAL LOW (ref >60)
GFR calc non Af Amer: 51 mL/min — ABNORMAL LOW (ref >60)
Glucose, Bld: 96 mg/dL (ref 65–99)
POTASSIUM: 4.2 mmol/L (ref 3.5–5.1)
SODIUM: 140 mmol/L (ref 135–145)

## 2015-07-02 NOTE — Progress Notes (Signed)
Patient ID: Don Jacobs, male   DOB: 11/25/1935, 79 y.o.   MRN: [id number]     Primary Care Physician: Henrine Screws, MD Referring Physician:Dr. Rayann Heman   Don Jacobs is a 79 y.o. male with a h/o PPM, CHF, persistent afib maintaining SR on tikosyn. He denies any episodes of afib. Weight is stable.. Avoiding salt and limiting fluids. C/o of some fatigue. Also had spells of a warm sensation starting in chest and traveling thru head with sensation of vision closing in and is being evaluated by neurology. Overall, he is active and feels well.  Today, he denies symptoms of palpitations, chest pain, shortness of breath, orthopnea, PND, lower extremity edema, dizziness, presyncope, syncope, or neurologic sequela. The patient is tolerating medications without difficulties and is otherwise without complaint today.   Past Medical History  Diagnosis Date  . Hypertension   . Persistent atrial fibrillation ([place])   . Sleep apnea   . Gout   . Hypothyroidism   . Allergic rhinitis   . ED (erectile dysfunction)   . BPH (benign prostatic hyperplasia)   . Hearing loss     uses hearing a cyst  . Hx of migraines   . Prostatitis, chronic   . Spondylolisthesis 07/2010  . Seborrheic keratosis   . Aortic aneurysm, thoracic ([place]) 05/25/2011  . Sick sinus syndrome ([facility])     a. s/p PPM   . Chronic systolic CHF (congestive heart failure) ([place])   . Dizziness and giddiness 06/04/2015   Past Surgical History  Procedure Laterality Date  . Back surgery    . L5 selective nerve root block      Dr Nelva Bush  . Foot surgery Right ~ 2007    "reconstruction" (02/17/2013)  . Shoulder open rotator cuff repair Right 09/2007; 2013  . Repair peroneal tendons ankle Right 10/2003  . Knee arthroscopy Left ~ 2007  . Anterior cervical decomp/discectomy fusion  10/2000    "C4, 5, 6 w/fusion" (02/17/2013)  . Lumbar spine surgery  2012    "bone graft, Dr. [name]" (02/17/2013)  . Insert / replace / remove pacemaker   02/17/2013  . Cardiac catheterization  2000's    "once" (02/17/2013)  . Cardioversion N/A 04/09/2014    Procedure: CARDIOVERSION;  Surgeon: Lelon Perla, MD;  Location: [facility];  Service: Cardiovascular;  Laterality: N/A;  . Permanent pacemaker insertion N/A 02/17/2013    Procedure: PERMANENT PACEMAKER INSERTION;  Surgeon: Deboraha Sprang, MD;  Location: [facility] CATH LAB;  Service: Cardiovascular;  Laterality: N/A;  . Cardioversion N/A 01/17/2015    Procedure: CARDIOVERSION;  Surgeon: Larey Dresser, MD;  Location: [facility];  Service: Cardiovascular;  Laterality: N/A;    Current Outpatient Prescriptions  Medication Sig Dispense Refill  . allopurinol (ZYLOPRIM) 300 MG tablet Take 300 mg by mouth at bedtime.     Marland Kitchen apixaban (ELIQUIS) 5 MG TABS tablet Take 1 tablet (5 mg total) by mouth 2 (two) times daily. 180 tablet 3  . benzonatate (TESSALON) 100 MG capsule Take 1 capsule (100 mg total) by mouth 3 (three) times daily as needed for cough. 90 capsule 1  . Calcium Carbonate (CALTRATE 600 PO) Take 600 mg by mouth at bedtime.     . cholecalciferol (VITAMIN D) 1000 UNITS tablet Take 1,000 Units by mouth at bedtime.    . colchicine 0.6 MG tablet Take 0.6 mg by mouth daily as needed (gout).     . cyanocobalamin 500 MCG tablet Take 500 mcg by mouth daily.    Marland Kitchen  dofetilide (TIKOSYN) 250 MCG capsule Take 1 capsule (250 mcg total) by mouth 2 (two) times daily. 60 capsule 6  . doxazosin (CARDURA) 4 MG tablet Take 1 tablet (4 mg total) by mouth at bedtime. 30 tablet 11  . furosemide (LASIX) 40 MG tablet Take 1 tablet (40 mg total) by mouth daily. 90 tablet 3  . guaiFENesin (ROBITUSSIN) 100 MG/5ML SOLN Take 10 mLs by mouth every 4 (four) hours as needed for cough or to loosen phlegm.    Marland Kitchen guaiFENesin-codeine (ROBITUSSIN AC) 100-10 MG/5ML syrup Take 5 mLs by mouth 3 (three) times daily as needed for cough.    . levothyroxine (SYNTHROID, LEVOTHROID) 112 MCG tablet Take 1 tablet (112 mcg total) by mouth  daily before breakfast. 30 tablet 3  . loratadine (CLARITIN) 10 MG tablet Take 5 mg by mouth 2 (two) times daily as needed for allergies or rhinitis.     . Magnesium 400 MG CAPS Take 400 mg by mouth 2 (two) times daily.    . Misc Natural Products (SAW PALMETTO) CAPS Take 1 capsule by mouth daily.    Marland Kitchen OVER THE COUNTER MEDICATION Take 1 tablet by mouth daily. muscudine seed    . potassium chloride (K-DUR,KLOR-CON) 20 MEQ tablet Take 2 tablets (57meq) in the AM and 1 tablet (69meq) in the PM 90 tablet 3  . predniSONE (DELTASONE) 10 MG tablet Take 10 mg by mouth daily as needed (gout flare up).     . vitamin C (ASCORBIC ACID) 500 MG tablet Take 500 mg by mouth daily.      No current facility-administered medications for this encounter.    Allergies  Allergen Reactions  . Penicillins Shortness Of Breath and Rash    Ended up at ER after using  . Oxycodone Other (See Comments)    Altered mental status    Social History   Social History  . Marital Status: Married    Spouse Name: N/A  . Number of Children: 2  . Years of Education: 14   Occupational History  . retired    Social History Main Topics  . Smoking status: Former Smoker -- 1.00 packs/day for 12 years    Types: Cigarettes    Quit date: 03/11/1981  . Smokeless tobacco: Never Used  . Alcohol Use: No  . Drug Use: No  . Sexual Activity: Not on file   Other Topics Concern  . Not on file   Social History Narrative   Patient does not drink caffeine.   Patient is right handed.       Family History  Problem Relation Age of Onset  . Heart disease Father   . Heart attack Father   . Emphysema Mother   . Alcohol abuse Maternal Grandmother   . Alcohol abuse Maternal Grandfather   . Heart attack Paternal Grandfather   . Heart disease Paternal Grandfather   . Rheum arthritis Brother     ROS- All systems are reviewed and negative except as per the HPI above  Physical Exam: Filed Vitals:   07/02/15 0852  BP: 116/74    Pulse: 76  Height: 5\' 9"  (1.753 m)  Weight: 228 lb 9.6 oz (103.692 kg)    GEN- The patient is well appearing, alert and oriented x 3 today.   Head- normocephalic, atraumatic Eyes-  Sclera clear, conjunctiva pink Ears- hearing intact Oropharynx- clear Neck- supple, no JVP Lymph- no cervical lymphadenopathy Lungs- Clear to ausculation bilaterally, normal work of breathing Heart- Regular rate and rhythm, no  murmurs, rubs or gallops, PMI not laterally displaced GI- soft, NT, ND, + BS Extremities- no clubbing, cyanosis, or edema MS- no significant deformity or atrophy Skin- no rash or lesion Psych- euthymic mood, full affect Neuro- strength and sensation are intact  EKG-Atrial paced rhythm with prolonged AV conduction, inferior infarct, age undetermined.  V. Rate 76 bpm, Pr int 252 ms, QTc 450 ms.  Assessment and Plan: 1. afib In SR on tikosyn Bmet today to follow up on Kt level. Continue xarelto  2. CHF Clinically stable Continue BB/ACE/diuretic Avoid salt/limit fluids  3. HTN Stable  4. PPM Just sent remote check F/u with Dr. Caryl Comes July 2017  F/u in afib clinic in 3 months  Geroge Baseman. [facility] [place], [place] [address] [phone]   F/u in [place] clinic

## 2015-07-03 LAB — CUP PACEART REMOTE DEVICE CHECK
Battery Remaining Longevity: 80 mo
Brady Statistic AS VS Percent: 12.05 %
Brady Statistic RV Percent Paced: 0.05 %
Lead Channel Impedance Value: 399 Ohm
Lead Channel Impedance Value: 456 Ohm
Lead Channel Impedance Value: 513 Ohm
Lead Channel Pacing Threshold Amplitude: 0.75 V
Lead Channel Pacing Threshold Pulse Width: 0.4 ms
Lead Channel Pacing Threshold Pulse Width: 0.4 ms
Lead Channel Sensing Intrinsic Amplitude: 1.75 mV
Lead Channel Sensing Intrinsic Amplitude: 1.75 mV
Lead Channel Setting Pacing Pulse Width: 0.4 ms
MDC IDC MSMT BATTERY VOLTAGE: 3.01 V
MDC IDC MSMT LEADCHNL RA PACING THRESHOLD AMPLITUDE: 0.5 V
MDC IDC MSMT LEADCHNL RV IMPEDANCE VALUE: 456 Ohm
MDC IDC MSMT LEADCHNL RV SENSING INTR AMPL: 9.75 mV
MDC IDC MSMT LEADCHNL RV SENSING INTR AMPL: 9.75 mV
MDC IDC SESS DTM: 20161003162350
MDC IDC SET LEADCHNL RA PACING AMPLITUDE: 2 V
MDC IDC SET LEADCHNL RV PACING AMPLITUDE: 2.5 V
MDC IDC SET LEADCHNL RV SENSING SENSITIVITY: 4 mV
MDC IDC SET ZONE DETECTION INTERVAL: 350 ms
MDC IDC STAT BRADY AP VP PERCENT: 0.05 %
MDC IDC STAT BRADY AP VS PERCENT: 87.9 %
MDC IDC STAT BRADY AS VP PERCENT: 0 %
MDC IDC STAT BRADY RA PERCENT PACED: 87.95 %
Zone Setting Detection Interval: 400 ms

## 2015-07-05 ENCOUNTER — Other Ambulatory Visit: Payer: Self-pay | Admitting: Surgery

## 2015-07-05 DIAGNOSIS — I712 Thoracic aortic aneurysm, without rupture, unspecified: Secondary | ICD-10-CM

## 2015-07-11 DIAGNOSIS — R42 Dizziness and giddiness: Secondary | ICD-10-CM | POA: Diagnosis not present

## 2015-07-11 DIAGNOSIS — Z23 Encounter for immunization: Secondary | ICD-10-CM | POA: Diagnosis not present

## 2015-07-24 ENCOUNTER — Ambulatory Visit
Admission: RE | Admit: 2015-07-24 | Discharge: 2015-07-24 | Disposition: A | Discharge status: Home / Self Care | Payer: Medicare Other | Source: Ambulatory Visit | Attending: Surgery | Admitting: Surgery

## 2015-07-24 ENCOUNTER — Ambulatory Visit (INDEPENDENT_AMBULATORY_CARE_PROVIDER_SITE_OTHER): Payer: Medicare Other | Admitting: Surgery

## 2015-07-24 ENCOUNTER — Encounter: Payer: Self-pay | Admitting: Surgery

## 2015-07-24 VITALS — BP 109/68 | HR 82 | Resp 16 | Ht 69.0 in | Wt 227.0 lb

## 2015-07-24 DIAGNOSIS — I7121 Aneurysm of the ascending aorta, without rupture: Secondary | ICD-10-CM

## 2015-07-24 DIAGNOSIS — I712 Thoracic aortic aneurysm, without rupture, unspecified: Secondary | ICD-10-CM

## 2015-07-24 MED ORDER — IOPAMIDOL (ISOVUE-370) INJECTION 76%
75.0000 mL | Freq: Once | INTRAVENOUS | Status: AC | PRN
  Administered 2015-07-24: 75 mL via INTRAVENOUS

## 2015-07-24 NOTE — Progress Notes (Signed)
HPI:  Patient returns today for followup of an ascending aortic aneurysm that has stable at 4.7 cm since I saw him in September 2013. I last saw him one year ago. He was hospitalized in April 2016 for new heart failure and atrial fibrillation with RVR and subsequently underwent DCCV. He denies any chest pain.  Current Outpatient Prescriptions  Medication Sig Dispense Refill  . allopurinol (ZYLOPRIM) 300 MG tablet Take 300 mg by mouth at bedtime.     Marland Kitchen apixaban (ELIQUIS) 5 MG TABS tablet Take 1 tablet (5 mg total) by mouth 2 (two) times daily. 180 tablet 3  . benzonatate (TESSALON) 100 MG capsule Take 1 capsule (100 mg total) by mouth 3 (three) times daily as needed for cough. 90 capsule 1  . Calcium Carbonate (CALTRATE 600 PO) Take 600 mg by mouth at bedtime.     . cholecalciferol (VITAMIN D) 1000 UNITS tablet Take 1,000 Units by mouth at bedtime.    . colchicine 0.6 MG tablet Take 0.6 mg by mouth daily as needed (gout).     . cyanocobalamin 500 MCG tablet Take 500 mcg by mouth daily.    Marland Kitchen dofetilide (TIKOSYN) 250 MCG capsule Take 1 capsule (250 mcg total) by mouth 2 (two) times daily. 60 capsule 6  . doxazosin (CARDURA) 4 MG tablet Take 1 tablet (4 mg total) by mouth at bedtime. 30 tablet 11  . furosemide (LASIX) 40 MG tablet Take 1 tablet (40 mg total) by mouth daily. 90 tablet 3  . guaiFENesin (ROBITUSSIN) 100 MG/5ML SOLN Take 10 mLs by mouth every 4 (four) hours as needed for cough or to loosen phlegm.    Marland Kitchen levothyroxine (SYNTHROID, LEVOTHROID) 112 MCG tablet Take 1 tablet (112 mcg total) by mouth daily before breakfast. 30 tablet 3  . loratadine (CLARITIN) 10 MG tablet Take 5 mg by mouth 2 (two) times daily as needed for allergies or rhinitis.     . Magnesium 400 MG CAPS Take 400 mg by mouth 2 (two) times daily.    . Misc Natural Products (SAW PALMETTO) CAPS Take 1 capsule by mouth daily.    . potassium chloride (K-DUR,KLOR-CON) 20 MEQ tablet Take 2 tablets (87meq) in the AM and 1  tablet (16meq) in the PM 90 tablet 3  . predniSONE (DELTASONE) 10 MG tablet Take 10 mg by mouth daily as needed (gout flare up).     . vitamin C (ASCORBIC ACID) 500 MG tablet Take 500 mg by mouth daily.     Marland Kitchen OVER THE COUNTER MEDICATION Take 1 tablet by mouth daily. muscudine seed     No current facility-administered medications for this visit.     Physical Exam: BP 109/68 mmHg  Pulse 82  Resp 16  Ht 5\' 9"  (1.753 m)  Wt 227 lb (102.967 kg)  BMI 33.51 kg/m2  SpO2 96% He looks well.  Cardiac exam shows a regular rate and rhythm with normal S1 and S2. There is no murmur. Lung exam is clear.   Diagnostic Tests:  CLINICAL DATA: Thoracic aortic aneurysm without rupture.  EXAM: CT ANGIOGRAPHY CHEST WITH CONTRAST  TECHNIQUE: Multidetector CT imaging of the chest was performed using the standard protocol during bolus administration of intravenous contrast. Multiplanar CT image reconstructions and MIPs were obtained to evaluate the vascular anatomy.  CONTRAST: 75 mL of Isovue 370 intravenously.  COMPARISON: CT scan of July 18, 2014.  FINDINGS: Descending thoracic aorta measures 4.7 cm at the sinus of Valsalva. The diameter  at the sino-tubular junction is approximately 4.1 cm. Ascending thoracic aorta has maximum measured diameter of 4.9 cm. Transverse aortic arch measures 3.2 cm. Proximal portion of descending thoracic aorta measures 3.2 cm. Distal portion of descending thoracic aorta measures 2.9 cm. There is no evidence of thoracic aortic dissection. Great vessels are widely patent without significant stenosis. No pneumothorax or pleural effusion is noted. Stable 7 mm nodule is noted in superior segment of left lower lobe adjacent to major fissure. No acute pulmonary disease is noted. Visualized portion of upper abdomen is unremarkable. No significant osseous abnormality is noted. Left-sided pacemaker is noted with leads in grossly good position. No significant  mediastinal mass or adenopathy is noted. Coronary artery calcifications are noted.  Review of the MIP images confirms the above findings.  IMPRESSION: Ascending thoracic aorta aneurysm is noted with maximum measured diameter 4.9 cm which is minimally increased to prior exam. Recommend semi-annual imaging followup by CTA or MRA and referral to cardiothoracic surgery if not already obtained. This recommendation follows 2010 ACCF/AHA/AATS/ACR/ASA/SCA/SCAI/SIR/STS/SVM Guidelines for the Diagnosis and Management of Patients With Thoracic Aortic Disease. Circulation. 2010; 121: G254-Y706.  Coronary artery calcifications are noted suggesting coronary artery disease.  Stable 7 mm pleural-based nodule seen in superior segment of left lower lobe adjacent to major fissure. Followup CT scan in 12 months is recommended to ensure stability.   Electronically Signed  By: Marijo Conception, M.D.  On: 07/24/2015 12:16   Impression:  He has a fusiform ascending aortic aneurysm that appears slightly larger at 4.9 cm compared to the previous measurement one year ago at 4.7-4.8 cm. This does not require surgical repair at this time. His BP is under good control.  Plan:  I will see him back in 1 year with a CTA of the chest.   Gaye Pollack, MD [facility] [phone]

## 2015-08-07 ENCOUNTER — Encounter: Payer: Self-pay | Admitting: Cardiology

## 2015-08-13 ENCOUNTER — Encounter: Payer: Self-pay | Admitting: Internal Medicine

## 2015-08-21 ENCOUNTER — Other Ambulatory Visit (HOSPITAL_COMMUNITY): Payer: Self-pay | Admitting: *Deleted

## 2015-08-21 MED ORDER — POTASSIUM CHLORIDE CRYS ER 20 MEQ PO TBCR: EXTENDED_RELEASE_TABLET | ORAL | Status: DC

## 2015-08-26 DIAGNOSIS — M546 Pain in thoracic spine: Secondary | ICD-10-CM | POA: Diagnosis not present

## 2015-08-26 DIAGNOSIS — M47816 Spondylosis without myelopathy or radiculopathy, lumbar region: Secondary | ICD-10-CM | POA: Diagnosis not present

## 2015-08-26 DIAGNOSIS — Z981 Arthrodesis status: Secondary | ICD-10-CM | POA: Diagnosis not present

## 2015-08-26 DIAGNOSIS — Z6834 Body mass index (BMI) 34.0-34.9, adult: Secondary | ICD-10-CM | POA: Diagnosis not present

## 2015-08-26 DIAGNOSIS — M5136 Other intervertebral disc degeneration, lumbar region: Secondary | ICD-10-CM | POA: Diagnosis not present

## 2015-08-26 DIAGNOSIS — M5440 Lumbago with sciatica, unspecified side: Secondary | ICD-10-CM | POA: Diagnosis not present

## 2015-09-20 DIAGNOSIS — Z0001 Encounter for general adult medical examination with abnormal findings: Secondary | ICD-10-CM | POA: Diagnosis not present

## 2015-09-20 DIAGNOSIS — I4891 Unspecified atrial fibrillation: Secondary | ICD-10-CM | POA: Diagnosis not present

## 2015-09-20 DIAGNOSIS — R42 Dizziness and giddiness: Secondary | ICD-10-CM | POA: Diagnosis not present

## 2015-09-20 DIAGNOSIS — Z86010 Personal history of colonic polyps: Secondary | ICD-10-CM | POA: Diagnosis not present

## 2015-09-20 DIAGNOSIS — Z950 Presence of cardiac pacemaker: Secondary | ICD-10-CM | POA: Diagnosis not present

## 2015-09-20 DIAGNOSIS — M109 Gout, unspecified: Secondary | ICD-10-CM | POA: Diagnosis not present

## 2015-09-20 DIAGNOSIS — N529 Male erectile dysfunction, unspecified: Secondary | ICD-10-CM | POA: Diagnosis not present

## 2015-09-20 DIAGNOSIS — N400 Enlarged prostate without lower urinary tract symptoms: Secondary | ICD-10-CM | POA: Diagnosis not present

## 2015-09-20 DIAGNOSIS — Z7901 Long term (current) use of anticoagulants: Secondary | ICD-10-CM | POA: Diagnosis not present

## 2015-09-20 DIAGNOSIS — M545 Low back pain: Secondary | ICD-10-CM | POA: Diagnosis not present

## 2015-09-20 DIAGNOSIS — Z1389 Encounter for screening for other disorder: Secondary | ICD-10-CM | POA: Diagnosis not present

## 2015-09-26 ENCOUNTER — Ambulatory Visit: Payer: Medicare Other | Admitting: Internal Medicine

## 2015-09-26 ENCOUNTER — Encounter: Payer: Self-pay | Admitting: Internal Medicine

## 2015-09-26 VITALS — BP 138/72 | HR 69 | Ht 69.0 in | Wt 233.0 lb

## 2015-09-26 DIAGNOSIS — G4733 Obstructive sleep apnea (adult) (pediatric): Secondary | ICD-10-CM

## 2015-09-26 NOTE — Progress Notes (Signed)
Patient ID: Don Jacobs, male    DOB: Aug 19, 1936, 79 y.o.   MRN: [id number] HPI 03/12/11- 47 yoM seen for Dr [name] because of hypoxia and sleep apnea.   Diagnosed obstructive sleep apnea with NPSG 8//2/88 RDI/ AHI  30.6/hr. Last here in 2006. He had done well on CPAP 8.5/ [facility], but had gained weight and was waking tired recently. Had back surgery January 26, 2011. After surgery his oxygen saturation fell despite CPAP and he was given supplemental O2.  He quit smoking 30 years ago and denies hx of lung disease. Has an incidental cold this week, but denies routine cough or wheeze. Remote broken nose, no ENT surgery, Doesn't think he snores though his mask.   Atrial Fib dx;d January, 2012- now on coumadin. Treated hypothyroidism.    04/14/11-  89 yoM former smoker seen for Dr [name] because of hypoxia and sleep apnea.   Continues CPAP all night every night as he has for years and can't sleep without it. He thinks the pressure change up to 10 ([facility])  has helped.   ONOX on RA/CPAP 03/29/11 recorded 12 minutes with sat <88%, which I reviewed with him. He felt better after the increase to 10. We agreed that rather than adding an O2 concentrator, we would see how he does with one more pressure increase.,  >> CPAP 12  09/25/13- 35 yoM former smoker seen for Dr [name] because of hypoxia and sleep apnea, complicated by AFib, HBP, hypothyroid FOLLOWS FOR: Sudden onset of snoring even with CPAP 10 machine-went to [facility] for new mask; they checked machine and sent report to [place].  Download/[facility]-good compliance and control on 10 CWP. He reports starting to snore and feel tired so we discussed increase to 12  12.29/15- 78 yoM former smoker seen for Dr [name] because of hypoxia and sleep apnea, complicated by AFib, HBP, hypothyroid    wife here FOLLOWS FOR: wears cpap 8-11 hours daily.  No pressure with supplies.  needs new mask.  interested in seeing if it's time for a new machine.    They report he  occasionally snores through his CPAP 12/[facility], but not often. Machine is getting old and we discussed replacement.  09/26/2015-79 year old male former smoker followed for hypoxia and sleep apnea, complicated by A. Fib/ pacemaker, HBP, hypothyroid CPAP 12/[facility] FOLLOWS FOR: pt. states he wears CPAP 9hr. everynight. feels pressure is good. supplies needed (mask). DL needed. DME: [facility]    ROS-see HPI Constitutional:   No-   weight loss, night sweats, fevers, chills, +fatigue, lassitude. HEENT:   No-  headaches, difficulty swallowing, tooth/dental problems, sore throat,       No-  sneezing, itching, ear ache, nasal congestion, post nasal drip,  CV:  No-   chest pain, orthopnea, PND, swelling in lower extremities, anasarca, dizziness, palpitations Resp: No-   shortness of breath with exertion or at rest.              No-   productive cough,  No non-productive cough,  No- coughing up of blood.              No-   change in color of mucus.  No- wheezing.   Skin: No-   rash or lesions. GI:  No-   heartburn, indigestion, abdominal pain, nausea, vomiting,  GU:  MS:  No-   joint pain or swelling.   Neuro-     nothing unusual Psych:  No- change in mood or affect. No depression or anxiety.  No memory loss.  OBJ- Physical Exam General- Alert, Oriented, Affect-appropriate, Distress- none acute, + overweight Skin- rash-none, lesions- none, excoriation- none Lymphadenopathy- none Head- atraumatic            Eyes- Gross vision intact, PERRLA, conjunctivae and secretions clear            Ears- + Hearing aid            Nose- Clear, no-Septal dev, mucus, polyps, erosion, perforation             Throat- Mallampati IV , mucosa clear , drainage- none, tonsils- atrophic. dentures Neck- flexible , trachea midline, no stridor , thyroid nl, carotid no bruit Chest - symmetrical excursion , unlabored           Heart/CV- RRR/ paced, no murmur , no gallop  , no rub, nl s1 s2                           - JVD-  none , edema- none, stasis changes- none, varices- none           Lung- clear to P&A, wheeze- none, cough- none , dullness-none, rub- none           Chest wall-  L pacemaker Abd-  Br/ Gen/ Rectal- Not done, not indicated Extrem- cyanosis- none, clubbing, none, atrophy- none, strength- nl Neuro- grossly intact to observation

## 2015-09-26 NOTE — Patient Instructions (Signed)
order- DME [facility]- replacement CPAP mask of choice and supplies    Dx OSA

## 2015-10-01 ENCOUNTER — Telehealth: Payer: Self-pay | Admitting: Cardiology

## 2015-10-01 ENCOUNTER — Ambulatory Visit (INDEPENDENT_AMBULATORY_CARE_PROVIDER_SITE_OTHER): Payer: Medicare Other | Admitting: *Deleted

## 2015-10-01 DIAGNOSIS — M109 Gout, unspecified: Secondary | ICD-10-CM | POA: Diagnosis not present

## 2015-10-01 DIAGNOSIS — R001 Bradycardia, unspecified: Secondary | ICD-10-CM

## 2015-10-01 NOTE — Telephone Encounter (Signed)
Spoke with pt and reminded pt of remote transmission that is due today. Pt verbalized understanding.   

## 2015-10-02 NOTE — Progress Notes (Signed)
Remote pacemaker transmission.   

## 2015-10-08 ENCOUNTER — Inpatient Hospital Stay (HOSPITAL_COMMUNITY): Admission: RE | Admit: 2015-10-08 | Payer: Medicare Other | Source: Ambulatory Visit | Admitting: Nurse Practitioner

## 2015-10-08 LAB — CUP PACEART REMOTE DEVICE CHECK
Battery Remaining Longevity: 78 mo
Battery Voltage: 3.01 V
Brady Statistic AP VP Percent: 0.04 %
Brady Statistic AS VP Percent: 0 %
Brady Statistic RA Percent Paced: 83.99 %
Date Time Interrogation Session: 20170103200237
Implantable Lead Implant Date: 20140523
Implantable Lead Location: 753859
Implantable Lead Model: 5076
Implantable Lead Model: 5076
Lead Channel Impedance Value: 437 Ohm
Lead Channel Pacing Threshold Amplitude: 0.5 V
Lead Channel Pacing Threshold Pulse Width: 0.4 ms
Lead Channel Sensing Intrinsic Amplitude: 9.625 mV
Lead Channel Setting Pacing Amplitude: 2 V
Lead Channel Setting Pacing Pulse Width: 0.4 ms
Lead Channel Setting Sensing Sensitivity: 4 mV
MDC IDC LEAD IMPLANT DT: 20140523
MDC IDC LEAD LOCATION: 753860
MDC IDC MSMT LEADCHNL RA IMPEDANCE VALUE: 380 Ohm
MDC IDC MSMT LEADCHNL RA IMPEDANCE VALUE: 437 Ohm
MDC IDC MSMT LEADCHNL RA SENSING INTR AMPL: 1.625 mV
MDC IDC MSMT LEADCHNL RA SENSING INTR AMPL: 1.625 mV
MDC IDC MSMT LEADCHNL RV IMPEDANCE VALUE: 494 Ohm
MDC IDC MSMT LEADCHNL RV PACING THRESHOLD AMPLITUDE: 0.75 V
MDC IDC MSMT LEADCHNL RV PACING THRESHOLD PULSEWIDTH: 0.4 ms
MDC IDC MSMT LEADCHNL RV SENSING INTR AMPL: 9.625 mV
MDC IDC SET LEADCHNL RV PACING AMPLITUDE: 2.5 V
MDC IDC STAT BRADY AP VS PERCENT: 83.94 %
MDC IDC STAT BRADY AS VS PERCENT: 16.01 %
MDC IDC STAT BRADY RV PERCENT PACED: 0.05 %

## 2015-10-09 ENCOUNTER — Encounter: Payer: Self-pay | Admitting: Cardiology

## 2015-10-11 ENCOUNTER — Encounter (HOSPITAL_COMMUNITY): Payer: Self-pay | Admitting: Nurse Practitioner

## 2015-10-11 ENCOUNTER — Ambulatory Visit (HOSPITAL_COMMUNITY)
Admission: RE | Admit: 2015-10-11 | Discharge: 2015-10-11 | Disposition: A | Discharge status: Home / Self Care | Payer: Medicare Other | Source: Ambulatory Visit | Attending: Nurse Practitioner | Admitting: Nurse Practitioner

## 2015-10-11 ENCOUNTER — Telehealth: Payer: Self-pay | Admitting: Neurology

## 2015-10-11 VITALS — BP 122/86 | HR 72 | Ht 69.0 in | Wt 228.2 lb

## 2015-10-11 DIAGNOSIS — I509 Heart failure, unspecified: Secondary | ICD-10-CM | POA: Diagnosis not present

## 2015-10-11 DIAGNOSIS — I10 Essential (primary) hypertension: Secondary | ICD-10-CM | POA: Diagnosis not present

## 2015-10-11 DIAGNOSIS — I4891 Unspecified atrial fibrillation: Secondary | ICD-10-CM | POA: Diagnosis not present

## 2015-10-11 LAB — BASIC METABOLIC PANEL
Anion gap: 9 (ref 5–15)
BUN: 21 mg/dL — AB (ref 6–20)
CALCIUM: 9.3 mg/dL (ref 8.9–10.3)
CO2: 34 mmol/L — AB (ref 22–32)
CREATININE: 1.33 mg/dL — AB (ref 0.61–1.24)
Chloride: 100 mmol/L — ABNORMAL LOW (ref 101–111)
GFR calc Af Amer: 58 mL/min — ABNORMAL LOW (ref >60)
GFR calc non Af Amer: 50 mL/min — ABNORMAL LOW (ref >60)
GLUCOSE: 91 mg/dL (ref 65–99)
Potassium: 4 mmol/L (ref 3.5–5.1)
Sodium: 143 mmol/L (ref 135–145)

## 2015-10-11 LAB — MAGNESIUM: Magnesium: 1.9 mg/dL (ref 1.7–2.4)

## 2015-10-11 NOTE — Progress Notes (Signed)
Patient ID: Don Jacobs, male   DOB: 09/02/1936, 80 y.o.   MRN: [id number]     Primary Care Physician: Henrine Screws, MD Referring Physician:Dr. Rayann Heman   Don Jacobs is a 80 y.o. male with a h/o PPM, CHF, persistent afib maintaining SR on tikosyn. He denies any episodes of afib. Weight is stable. Avoiding salt and limiting fluids. Overall, he is active and feels well.  Today, he denies symptoms of palpitations, chest pain, shortness of breath, orthopnea, PND, lower extremity edema, dizziness, presyncope, syncope, or neurologic sequela. The patient is tolerating medications without difficulties and is otherwise without complaint today.   Past Medical History  Diagnosis Date  . Hypertension   . Persistent atrial fibrillation ([place])   . Sleep apnea   . Gout   . Hypothyroidism   . Allergic rhinitis   . ED (erectile dysfunction)   . BPH (benign prostatic hyperplasia)   . Hearing loss     uses hearing a cyst  . Hx of migraines   . Prostatitis, chronic   . Spondylolisthesis 07/2010  . Seborrheic keratosis   . Aortic aneurysm, thoracic ([place]) 05/25/2011  . Sick sinus syndrome ([facility])     a. s/p PPM   . Chronic systolic CHF (congestive heart failure) ([place])   . Dizziness and giddiness 06/04/2015   Past Surgical History  Procedure Laterality Date  . Back surgery    . L5 selective nerve root block      Dr Nelva Bush  . Foot surgery Right ~ 2007    "reconstruction" (02/17/2013)  . Shoulder open rotator cuff repair Right 09/2007; 2013  . Repair peroneal tendons ankle Right 10/2003  . Knee arthroscopy Left ~ 2007  . Anterior cervical decomp/discectomy fusion  10/2000    "C4, 5, 6 w/fusion" (02/17/2013)  . Lumbar spine surgery  2012    "bone graft, Dr. [name]" (02/17/2013)  . Insert / replace / remove pacemaker  02/17/2013  . Cardiac catheterization  2000's    "once" (02/17/2013)  . Cardioversion N/A 04/09/2014    Procedure: CARDIOVERSION;  Surgeon: Lelon Perla, MD;  Location[facility]  ENDOSCOPY;  Service: Cardiovascular;  Laterality: N/A;  . Permanent pacemaker insertion N/A 02/17/2013    Procedure: PERMANENT PACEMAKER INSERTION;  Surgeon: Deboraha Sprang, MD;  Location: [facility] CATH LAB;  Service: Cardiovascular;  Laterality: N/A;  . Cardioversion N/A 01/17/2015    Procedure: CARDIOVERSION;  Surgeon: Larey Dresser, MD;  Location: [place];  Service: Cardiovascular;  Laterality: N/A;    Current Outpatient Prescriptions  Medication Sig Dispense Refill  . allopurinol (ZYLOPRIM) 300 MG tablet Take 300 mg by mouth at bedtime.     Marland Kitchen apixaban (ELIQUIS) 5 MG TABS tablet Take 1 tablet (5 mg total) by mouth 2 (two) times daily. 180 tablet 3  . benzonatate (TESSALON) 100 MG capsule Take 1 capsule (100 mg total) by mouth 3 (three) times daily as needed for cough. 90 capsule 1  . Calcium Carbonate (CALTRATE 600 PO) Take 600 mg by mouth at bedtime.     . cholecalciferol (VITAMIN D) 1000 UNITS tablet Take 1,000 Units by mouth at bedtime.    . colchicine 0.6 MG tablet Take 0.6 mg by mouth daily as needed (gout).     . cyanocobalamin 500 MCG tablet Take 500 mcg by mouth daily.    Marland Kitchen dofetilide (TIKOSYN) 250 MCG capsule Take 1 capsule (250 mcg total) by mouth 2 (two) times daily. 60 capsule 6  . doxazosin (CARDURA) 4 MG tablet  Take 1 tablet (4 mg total) by mouth at bedtime. 30 tablet 11  . furosemide (LASIX) 40 MG tablet Take 1 tablet (40 mg total) by mouth daily. 90 tablet 3  . guaiFENesin (ROBITUSSIN) 100 MG/5ML SOLN Take 10 mLs by mouth every 4 (four) hours as needed for cough or to loosen phlegm.    Marland Kitchen levothyroxine (SYNTHROID, LEVOTHROID) 112 MCG tablet Take 1 tablet (112 mcg total) by mouth daily before breakfast. 30 tablet 3  . loratadine (CLARITIN) 10 MG tablet Take 5 mg by mouth 2 (two) times daily as needed for allergies or rhinitis.     . Magnesium 400 MG CAPS Take 400 mg by mouth 2 (two) times daily.    . Misc Natural Products (SAW PALMETTO) CAPS Take 1 capsule by mouth daily.    Marland Kitchen  OVER THE COUNTER MEDICATION Take 1 tablet by mouth daily. muscudine seed    . potassium chloride SA (K-DUR,KLOR-CON) 20 MEQ tablet Take 2 tablets (15meq) in the AM and 1 tablet (8meq) in the PM 90 tablet 12  . predniSONE (DELTASONE) 10 MG tablet Take 10 mg by mouth daily as needed (gout flare up).     . vitamin C (ASCORBIC ACID) 500 MG tablet Take 500 mg by mouth daily.      No current facility-administered medications for this encounter.    Allergies  Allergen Reactions  . Penicillins Shortness Of Breath and Rash    Ended up at ER after using  . Oxycodone Other (See Comments)    Altered mental status    Social History   Social History  . Marital Status: Married    Spouse Name: N/A  . Number of Children: 2  . Years of Education: 14   Occupational History  . retired    Social History Main Topics  . Smoking status: Former Smoker -- 1.00 packs/day for 12 years    Types: Cigarettes    Quit date: 03/11/1981  . Smokeless tobacco: Never Used  . Alcohol Use: No  . Drug Use: No  . Sexual Activity: Not on file   Other Topics Concern  . Not on file   Social History Narrative   Patient does not drink caffeine.   Patient is right handed.       Family History  Problem Relation Age of Onset  . Heart disease Father   . Heart attack Father   . Emphysema Mother   . Alcohol abuse Maternal Grandmother   . Alcohol abuse Maternal Grandfather   . Heart attack Paternal Grandfather   . Heart disease Paternal Grandfather   . Rheum arthritis Brother     ROS- All systems are reviewed and negative except as per the HPI above  Physical Exam: There were no vitals filed for this visit.  GEN- The patient is well appearing, alert and oriented x 3 today.   Head- normocephalic, atraumatic Eyes-  Sclera clear, conjunctiva pink Ears- hearing intact Oropharynx- clear Neck- supple, no JVP Lymph- no cervical lymphadenopathy Lungs- Clear to ausculation bilaterally, normal work of  breathing Heart- Regular rate and rhythm, no murmurs, rubs or gallops, PMI not laterally displaced GI- soft, NT, ND, + BS Extremities- no clubbing, cyanosis, or edema MS- no significant deformity or atrophy Skin- no rash or lesion Psych- euthymic mood, full affect Neuro- strength and sensation are intact  EKG-Atrial paced rhythm with prolonged AV conduction, inferior infarct, age undetermined.  V. Rate 76 bpm, Pr int 252 ms, QTc 450 ms. Remote scan reviewed from  1/4 one SVT epiosde x 16 beats  Assessment and Plan: 1. afib In SR on tikosyn Bmet/mag Continue xarelto  2. CHF Clinically stable Continue BB/ACE/diuretic Avoid salt/limit fluids  3. HTN Stable  4. PPM Just sent remote check F/u with Dr. Caryl Comes July 2017  F/u in afib clinic in 3 months  Don Jacobs, [facility] [address] [place], [place] [address] [phone]

## 2015-10-11 NOTE — Telephone Encounter (Signed)
Pt called said he had quit eating any kind of nuts about a week after seeing Dr Jannifer Franklin in September 2016 and the dizziness went away. Then about a month or so later he tried about 5 or 6 peanuts and the dizziness returned. He has not eaten any peanuts since and has not had any symptoms. Pt c/a appt for 1/17 said he didn't want to waste Dr Jannifer Franklin time. He is very grateful for the help he gave him.

## 2015-10-11 NOTE — Addendum Note (Signed)
Encounter addended by: Juluis Mire, RN on: 10/11/2015  1:53 PM<BR>     Documentation filed: Orders

## 2015-10-14 NOTE — Telephone Encounter (Signed)
Noted  

## 2015-10-15 ENCOUNTER — Ambulatory Visit: Payer: Self-pay | Admitting: Neurology

## 2015-10-29 ENCOUNTER — Telehealth (HOSPITAL_COMMUNITY): Payer: Self-pay | Admitting: *Deleted

## 2015-10-29 NOTE — Telephone Encounter (Signed)
Pt wife called in this morning stating patient woke up to shortness of breath and irregular heart beat. HR is ranging from 70-116 BP 131/73. Patient had not yet taken medications. Instructed to take medications (including tikosyn) and rest for few hours then call back to report if HR converted back to NSR.

## 2015-11-04 ENCOUNTER — Telehealth: Payer: Self-pay | Admitting: Internal Medicine

## 2015-11-04 DIAGNOSIS — G4733 Obstructive sleep apnea (adult) (pediatric): Secondary | ICD-10-CM

## 2015-11-04 NOTE — Telephone Encounter (Signed)
Ok order DME set new CPAP machine at 8 and request AirView or download    Dx OSA

## 2015-11-04 NOTE — Telephone Encounter (Signed)
Spoke with Melissa from [facility]. Pt has 2 CPAP units 1) old tower unit set at 8 and 2) new S9 elite set at 12. Pt just started using the s9 unit in the past few days. Pt advised it was too much pressure and wants pressure changed from 12. Please advise Dr. Annamaria Boots thanks

## 2015-11-04 NOTE — Telephone Encounter (Signed)
Melissa is aware of change. Order has been placed. Nothing further needed

## 2015-11-19 ENCOUNTER — Encounter: Payer: Self-pay | Admitting: Physician Assistant

## 2015-11-19 ENCOUNTER — Ambulatory Visit (INDEPENDENT_AMBULATORY_CARE_PROVIDER_SITE_OTHER): Payer: Medicare Other | Admitting: Physician Assistant

## 2015-11-19 ENCOUNTER — Telehealth: Payer: Self-pay | Admitting: Physician Assistant

## 2015-11-19 VITALS — BP 116/70 | HR 71 | Ht 69.0 in | Wt 226.0 lb

## 2015-11-19 DIAGNOSIS — I712 Thoracic aortic aneurysm, without rupture, unspecified: Secondary | ICD-10-CM

## 2015-11-19 DIAGNOSIS — I5022 Chronic systolic (congestive) heart failure: Secondary | ICD-10-CM

## 2015-11-19 DIAGNOSIS — I480 Paroxysmal atrial fibrillation: Secondary | ICD-10-CM

## 2015-11-19 DIAGNOSIS — I10 Essential (primary) hypertension: Secondary | ICD-10-CM

## 2015-11-19 DIAGNOSIS — Z01818 Encounter for other preprocedural examination: Secondary | ICD-10-CM | POA: Diagnosis not present

## 2015-11-19 NOTE — Assessment & Plan Note (Signed)
4.9 cm on recent CTA 06/2015 followed by Dr. Cyndia Bent

## 2015-11-19 NOTE — Assessment & Plan Note (Signed)
Managed with Tikosyn, Eliquis, and pacemaker. CHADSVASC=4

## 2015-11-19 NOTE — Telephone Encounter (Signed)
Pt in to see Estella Husk, PA-C today.  Pt was unsure if he had been taking Metoprolol or not because they noticed that it was off of his med list but was unsure of when it could have been d/c'ed.  Pt was advised to call the office and let us know once he got home and reviewed his medications. Pt's wife called and stated that pt was in fact taking Metoprolol 25mg  BID. Spoke with Estella Husk, PA-C and she said if pt is feeling fine on this medication that it is ok to continue.  Noted Lopressor d/c'ed on an office note from Neuro.   Spoke with wife again and she states that pt is doing well on this med and would like to continue it. Medication added back to pt's list.

## 2015-11-19 NOTE — Addendum Note (Signed)
Addended by: Loren Racer on: 11/19/2015 03:26 PM   Modules accepted: Medications

## 2015-11-19 NOTE — Progress Notes (Signed)
Cardiology Office Note   Date:  11/19/2015   ID:  Don Jacobs, DOB 13-Apr-1936, MRN [id number]  PCP:  Henrine Screws, MD  Cardiologist:    Chief Complaint:    History of Present Illness: Don Jacobs is a 80 y.o. male who presents for preoperative cardiac clearance before undergoing right total knee replacement at [facility] in [place]. He has history of atrial fibrillation treated with take this in and pacemaker placement. He had a fall and subdural hematoma a few years ago but has been cleared to take anticoagulation and has been on Eliquis. Patient has an abnormal echo with an EF of 40-45% 11/2014. He's had a negative ischemic workup in the past including a negative cardiac catheterization in May 2010. He's had a negative nuclear study in 11/2014. He has an ascending thoracic aortic aneurysm measuring 4.9 cm which is minimally increased on prior exam: CT Angio 07/24/15. He is followed closely by Dr. Cyndia Bent who did not think he needed surgical repair at the time and blood pressure was under good control. He follows him yearly.  Patient had an episode of atrial fib on 10/29/15 before he had taken his medicine that day. He called the afib clinic who told him to go ahead and take his tikosyn. He converted in about 20 min. No problems since then. Patient thinks he's taking metoprolol which hasn't been on his list since 05/2015. He gets his meds at the [place] and will verify when he gets home. He denies chest pain, palpitations since last afib episode, dizzziness, dyspnea, dyspnea on exertion. He swims  3 times a week and sometimes can walk a mile without any cardiac symptoms.  Past Medical History  Diagnosis Date  . Hypertension   . Persistent atrial fibrillation ([place])   . Sleep apnea   . Gout   . Hypothyroidism   . Allergic rhinitis   . ED (erectile dysfunction)   . BPH (benign prostatic hyperplasia)   . Hearing loss     uses hearing a cyst  . Hx of migraines   . Prostatitis,  chronic   . Spondylolisthesis 07/2010  . Seborrheic keratosis   . Aortic aneurysm, thoracic ([place]) 05/25/2011  . Sick sinus syndrome (HCC)     a. s/p PPM   . Chronic systolic CHF (congestive heart failure) ([place])   . Dizziness and giddiness 06/04/2015    Past Surgical History  Procedure Laterality Date  . Back surgery    . L5 selective nerve root block      Dr Nelva Bush  . Foot surgery Right ~ 2007    "reconstruction" (02/17/2013)  . Shoulder open rotator cuff repair Right 09/2007; 2013  . Repair peroneal tendons ankle Right 10/2003  . Knee arthroscopy Left ~ 2007  . Anterior cervical decomp/discectomy fusion  10/2000    "C4, 5, 6 w/fusion" (02/17/2013)  . Lumbar spine surgery  2012    "bone graft, Dr. [name]" (02/17/2013)  . Insert / replace / remove pacemaker  02/17/2013  . Cardiac catheterization  2000's    "once" (02/17/2013)  . Cardioversion N/A 04/09/2014    Procedure: CARDIOVERSION;  Surgeon: Lelon Perla, MD;  Location: [facility] ENDOSCOPY;  Service: Cardiovascular;  Laterality: N/A;  . Permanent pacemaker insertion N/A 02/17/2013    Procedure: PERMANENT PACEMAKER INSERTION;  Surgeon: Deboraha Sprang, MD;  Location: [facility] CATH LAB;  Service: Cardiovascular;  Laterality: N/A;  . Cardioversion N/A 01/17/2015    Procedure: CARDIOVERSION;  Surgeon: Larey Dresser, MD;  Location: [facility] ENDOSCOPY;  Service: Cardiovascular;  Laterality: N/A;     Current Outpatient Prescriptions  Medication Sig Dispense Refill  . allopurinol (ZYLOPRIM) 300 MG tablet Take 300 mg by mouth at bedtime.     Marland Kitchen apixaban (ELIQUIS) 5 MG TABS tablet Take 1 tablet (5 mg total) by mouth 2 (two) times daily. 180 tablet 3  . benzonatate (TESSALON) 100 MG capsule Take 1 capsule (100 mg total) by mouth 3 (three) times daily as needed for cough. 90 capsule 1  . Calcium Carbonate (CALTRATE 600 PO) Take 600 mg by mouth at bedtime.     . cholecalciferol (VITAMIN D) 1000 UNITS tablet Take 1,000 Units by mouth at bedtime.    . colchicine  0.6 MG tablet Take 0.6 mg by mouth daily as needed (gout).     . cyanocobalamin 500 MCG tablet Take 500 mcg by mouth daily.    Marland Kitchen dofetilide (TIKOSYN) 250 MCG capsule Take 1 capsule (250 mcg total) by mouth 2 (two) times daily. 60 capsule 6  . doxazosin (CARDURA) 4 MG tablet Take 1 tablet (4 mg total) by mouth at bedtime. 30 tablet 11  . furosemide (LASIX) 40 MG tablet Take 1 tablet (40 mg total) by mouth daily. 90 tablet 3  . guaiFENesin (ROBITUSSIN) 100 MG/5ML SOLN Take 10 mLs by mouth every 4 (four) hours as needed for cough or to loosen phlegm.    Marland Kitchen levothyroxine (SYNTHROID, LEVOTHROID) 112 MCG tablet Take 1 tablet (112 mcg total) by mouth daily before breakfast. 30 tablet 3  . loratadine (CLARITIN) 10 MG tablet Take 5 mg by mouth 2 (two) times daily as needed for allergies or rhinitis.     . Magnesium 400 MG CAPS Take 400 mg by mouth 2 (two) times daily.    . Misc Natural Products (SAW PALMETTO) CAPS Take 1 capsule by mouth daily.    Marland Kitchen OVER THE COUNTER MEDICATION Take 1 tablet by mouth daily. muscudine seed    . potassium chloride SA (K-DUR,KLOR-CON) 20 MEQ tablet Take 2 tablets (70meq) in the AM and 1 tablet (75meq) in the PM 90 tablet 12  . predniSONE (DELTASONE) 10 MG tablet Take 10 mg by mouth daily as needed (gout flare up).     . vitamin C (ASCORBIC ACID) 500 MG tablet Take 500 mg by mouth daily.      No current facility-administered medications for this visit.    Allergies:   Penicillins and Oxycodone    Social History:  The patient  reports that he quit smoking about 34 years ago. His smoking use included Cigarettes. He has a 12 pack-year smoking history. He has never used smokeless tobacco. He reports that he does not drink alcohol or use illicit drugs.   Family History:  The patient's    family history includes Alcohol abuse in his maternal grandfather and maternal grandmother; Emphysema in his mother; Heart attack in his father and paternal grandfather; Heart disease in his  father and paternal grandfather; Rheum arthritis in his brother.    ROS:  Please see the history of present illness.   Otherwise, review of systems are positive for hearing loss, snoring on CPAP, balance issues..   All other systems are reviewed and negative.    PHYSICAL EXAM: VS:  BP 116/70 mmHg  Pulse 71  Ht 5\' 9"  (1.753 m)  Wt 226 lb (102.513 kg)  BMI 33.36 kg/m2 , BMI Body mass index is 33.36 kg/(m^2). GEN: Well nourished, well developed, in no acute distress Neck:  no JVD, HJR, carotid bruits, or masses Cardiac:  RRR; no murmurs,gallop, rubs, thrill or heave,  Respiratory:  clear to auscultation bilaterally, normal work of breathing GI: soft, nontender, nondistended, + BS MS: no deformity or atrophy Extremities: without cyanosis, clubbing, edema, good distal pulses bilaterally.  Skin: warm and dry, no rash Neuro:  Strength and sensation are intact    EKG:  EKG is ordered today. The ekg ordered today demonstrates Paced rhythm.   Recent Labs: 01/03/2015: ALT 17 01/11/2015: Pro B Natriuretic peptide (BNP) 383.0* 01/13/2015: B Natriuretic Peptide 280.7*; TSH 4.015 01/14/2015: Hemoglobin 13.3; Platelets 156 10/11/2015: BUN 21*; Creatinine, Ser 1.33*; Magnesium 1.9; Potassium 4.0; Sodium 143    Lipid Panel No results found for: CHOL, TRIG, HDL, CHOLHDL, VLDL, LDLCALC, LDLDIRECT    Wt Readings from Last 3 Encounters:  11/19/15 226 lb (102.513 kg)  10/11/15 228 lb 3.2 oz (103.511 kg)  09/26/15 233 lb (105.688 kg)      Other studies Reviewed: Additional studies/ records that were reviewed today include and review of the records demonstrates:  IMPRESSION: Ascending thoracic aorta aneurysm is noted with maximum measured diameter 4.9 cm which is minimally increased to prior exam. Recommend semi-annual imaging followup by CTA or MRA and referral to cardiothoracic surgery if not already obtained. This recommendation follows 2010 ACCF/AHA/AATS/ACR/ASA/SCA/SCAI/SIR/STS/SVM  Guidelines for the Diagnosis and Management of Patients With Thoracic Aortic Disease. Circulation. 2010; [id number].   Coronary artery calcifications are noted suggesting coronary artery disease.   Stable 7 mm pleural-based nodule seen in superior segment of left lower lobe adjacent to major fissure. Followup CT scan in 12 months is recommended to ensure stability.       2-D echo 12/17/14 Study Conclusions  - Left ventricle: The cavity size was moderately dilated. There was   mild concentric hypertrophy. Systolic function was mildly to   moderately reduced. The estimated ejection fraction was in the   range of 40% to 45%. The EF may be underestimated due to   prominent paradoxical septal motion from pacer and irregular   rhythm Diffuse hypokinesis. There is akinesis of the basal   inferoseptal and basal inferior myocardium. - Ventricular septum: Septal motion showed moderate paradox. These   changes are consistent with right ventricular pacing. - Aorta: Aortic root dimension: 40 mm (ED). Ascending aortic   diameter: 46 mm (S). - Ascending aorta: The ascending aorta was moderately dilated. - Mitral valve: There was mild regurgitation. - Left atrium: The atrium was severely dilated. - Pulmonic valve: There was trivial regurgitation.  nuclear stress test 12/18/14 Overall Impression:  Low risk stress nuclear study.   Study was nongated because of the atrial fibrillation.  No ischemia by perfusion.  LV Ejection Fraction: Study not gated.  LV Wall Motion:  NA  Darlin Coco MD    ASSESSMENT AND PLAN: Preoperative clearance Patient is here for preoperative clearance before undergoing total knee replacement. He has history of paroxysmal atrial fibrillation on Tikosyn and Eliquis. He is not sure whether or not he is on metoprolol or not. He will verify this for [place]. He had one episode of palpitations about a month ago that converted with taking his daily medications earlier. I  discussed this patient with Dr. Daneen Schick who concurs that the patient is stable to proceed with his total knee replacement without any further cardiac workup. He does have LV dysfunction EF 40%, history of normal coronary arteries in 2010 and normal Myoview in 11/2014. He has no evidence of heart failure.  He also has thoracic aortic aneurysm followed by Dr. Cyndia Bent and needs good blood pressure control. Follow-up with Dr. Irish Lack after surgery. May stop Eliquis 48 hrs prior to surgery.  Hypertension Blood pressure well controlled  A-fib Managed with Tikosyn, Eliquis, and pacemaker. CHADSVASC=4  Aortic aneurysm, thoracic 4.9 cm on recent CTA 06/2015 followed by Dr. Cyndia Bent  Chronic systolic CHF (congestive heart failure) EF 40%. Compensated.     Sumner Boast, PA-C  11/19/2015 10:41 AM    [facility] [place], [place],   [address] Phone: [phone]; Fax: [phone]

## 2015-11-19 NOTE — Assessment & Plan Note (Signed)
Patient is here for preoperative clearance before undergoing total knee replacement. He has history of paroxysmal atrial fibrillation on Tikosyn and Eliquis. He is not sure whether or not he is on metoprolol or not. He will verify this for [place]. He had one episode of palpitations about a month ago that converted with taking his daily medications earlier. I discussed this patient with Dr. Daneen Schick who concurs that the patient is stable to proceed with his total knee replacement without any further cardiac workup. He does have LV dysfunction EF 40%, history of normal coronary arteries in 2010 and normal Myoview in 11/2014. He has no evidence of heart failure. He also has thoracic aortic aneurysm followed by Dr. Cyndia Bent and needs good blood pressure control. Follow-up with Dr. Irish Lack after surgery. May stop Eliquis 48 hrs prior to surgery.

## 2015-11-19 NOTE — Assessment & Plan Note (Signed)
Blood pressure well controlled

## 2015-11-19 NOTE — Assessment & Plan Note (Signed)
EF 40%. Compensated.

## 2015-11-19 NOTE — Patient Instructions (Signed)
Medication Instructions:  You may stop your Eliquis two days prior to your procedure.  Labwork: None  Testing/Procedures: None  Follow-Up: Keep plans for follow up in late September 2017 with Dr. Irish Lack.  Any Other Special Instructions Will Be Listed Below (If Applicable).     If you need a refill on your cardiac medications before your next appointment, please call your pharmacy.

## 2015-12-09 ENCOUNTER — Ambulatory Visit (HOSPITAL_COMMUNITY)
Admission: RE | Admit: 2015-12-09 | Discharge: 2015-12-09 | Disposition: A | Discharge status: Home / Self Care | Payer: Medicare Other | Source: Ambulatory Visit | Attending: Nurse Practitioner | Admitting: Nurse Practitioner

## 2015-12-09 ENCOUNTER — Encounter (HOSPITAL_COMMUNITY): Payer: Self-pay | Admitting: Nurse Practitioner

## 2015-12-09 ENCOUNTER — Encounter: Payer: Self-pay | Admitting: Internal Medicine

## 2015-12-09 VITALS — BP 102/68 | HR 84 | Ht 69.0 in | Wt 230.0 lb

## 2015-12-09 DIAGNOSIS — I110 Hypertensive heart disease with heart failure: Secondary | ICD-10-CM | POA: Diagnosis not present

## 2015-12-09 DIAGNOSIS — I481 Persistent atrial fibrillation: Secondary | ICD-10-CM | POA: Diagnosis present

## 2015-12-09 DIAGNOSIS — I495 Sick sinus syndrome: Secondary | ICD-10-CM | POA: Diagnosis not present

## 2015-12-09 DIAGNOSIS — E039 Hypothyroidism, unspecified: Secondary | ICD-10-CM | POA: Insufficient documentation

## 2015-12-09 DIAGNOSIS — Z79899 Other long term (current) drug therapy: Secondary | ICD-10-CM | POA: Insufficient documentation

## 2015-12-09 DIAGNOSIS — I4891 Unspecified atrial fibrillation: Secondary | ICD-10-CM | POA: Diagnosis not present

## 2015-12-09 DIAGNOSIS — Z825 Family history of asthma and other chronic lower respiratory diseases: Secondary | ICD-10-CM | POA: Insufficient documentation

## 2015-12-09 DIAGNOSIS — Z950 Presence of cardiac pacemaker: Secondary | ICD-10-CM | POA: Diagnosis not present

## 2015-12-09 DIAGNOSIS — Z87891 Personal history of nicotine dependence: Secondary | ICD-10-CM | POA: Insufficient documentation

## 2015-12-09 DIAGNOSIS — Z8249 Family history of ischemic heart disease and other diseases of the circulatory system: Secondary | ICD-10-CM | POA: Diagnosis not present

## 2015-12-09 DIAGNOSIS — I509 Heart failure, unspecified: Secondary | ICD-10-CM | POA: Insufficient documentation

## 2015-12-09 DIAGNOSIS — G4730 Sleep apnea, unspecified: Secondary | ICD-10-CM | POA: Diagnosis not present

## 2015-12-09 DIAGNOSIS — Z880 Allergy status to penicillin: Secondary | ICD-10-CM | POA: Diagnosis not present

## 2015-12-09 DIAGNOSIS — I480 Paroxysmal atrial fibrillation: Secondary | ICD-10-CM

## 2015-12-09 DIAGNOSIS — Z885 Allergy status to narcotic agent status: Secondary | ICD-10-CM | POA: Diagnosis not present

## 2015-12-09 DIAGNOSIS — Z7901 Long term (current) use of anticoagulants: Secondary | ICD-10-CM | POA: Insufficient documentation

## 2015-12-09 LAB — BASIC METABOLIC PANEL
Anion gap: 9 (ref 5–15)
BUN: 27 mg/dL — AB (ref 6–20)
CHLORIDE: 102 mmol/L (ref 101–111)
CO2: 31 mmol/L (ref 22–32)
CREATININE: 1.47 mg/dL — AB (ref 0.61–1.24)
Calcium: 8.9 mg/dL (ref 8.9–10.3)
GFR calc Af Amer: 51 mL/min — ABNORMAL LOW (ref >60)
GFR calc non Af Amer: 44 mL/min — ABNORMAL LOW (ref >60)
GLUCOSE: 110 mg/dL — AB (ref 65–99)
Potassium: 4.2 mmol/L (ref 3.5–5.1)
SODIUM: 142 mmol/L (ref 135–145)

## 2015-12-09 LAB — CBC
HEMATOCRIT: 41.3 % (ref 39.0–52.0)
HEMOGLOBIN: 13.4 g/dL (ref 13.0–17.0)
MCH: 31.2 pg (ref 26.0–34.0)
MCHC: 32.4 g/dL (ref 30.0–36.0)
MCV: 96 fL (ref 78.0–100.0)
Platelets: 128 10*3/uL — ABNORMAL LOW (ref 150–400)
RBC: 4.3 MIL/uL (ref 4.22–5.81)
RDW: 14.1 % (ref 11.5–15.5)
WBC: 6.7 10*3/uL (ref 4.0–10.5)

## 2015-12-09 LAB — MAGNESIUM: MAGNESIUM: 2 mg/dL (ref 1.7–2.4)

## 2015-12-09 LAB — TSH: TSH: 2.238 u[IU]/mL (ref 0.350–4.500)

## 2015-12-09 NOTE — Progress Notes (Addendum)
Patient ID: Don Jacobs, male   DOB: [birth year], 80 y.o.   MRN: [id number]     Primary Care Physician: Henrine Screws, MD Referring Physician:Dr. Rayann Heman   Don Jacobs is a 80 y.o. male with a h/o PPM, CHF, persistent afib maintaining SR on tikosyn. He denies any episodes of afib. Weight is stable. Avoiding salt and limiting fluids. Overall, he is active and feels well.  He called the office this am, 3/13,  wanting to be seen for afib since Saturday am. He feels more fatigued and short of breath when in afib.Has not had a break through episode in some time. Ekg shows SR, pt believes he may have returned to SR a few hours ago. Feels better at present. No triggers identified.Is thinking about having his knee replaced within the next month.  Today, he denies symptoms of palpitations, chest pain, shortness of breath, orthopnea, PND, lower extremity edema, dizziness, presyncope, syncope, or neurologic sequela. The patient is tolerating medications without difficulties and is otherwise without complaint today.   Past Medical History  Diagnosis Date  . Hypertension   . Persistent atrial fibrillation ([place])   . Sleep apnea   . Gout   . Hypothyroidism   . Allergic rhinitis   . ED (erectile dysfunction)   . BPH (benign prostatic hyperplasia)   . Hearing loss     uses hearing a cyst  . Hx of migraines   . Prostatitis, chronic   . Spondylolisthesis 07/2010  . Seborrheic keratosis   . Aortic aneurysm, thoracic ([place]) 05/25/2011  . Sick sinus syndrome (HCC)     a. s/p PPM   . Chronic systolic CHF (congestive heart failure) ([place])   . Dizziness and giddiness 06/04/2015   Past Surgical History  Procedure Laterality Date  . Back surgery    . L5 selective nerve root block      Dr Nelva Bush  . Foot surgery Right ~ 2007    "reconstruction" (02/17/2013)  . Shoulder open rotator cuff repair Right 09/2007; 2013  . Repair peroneal tendons ankle Right 10/2003  . Knee arthroscopy Left ~ 2007  .  Anterior cervical decomp/discectomy fusion  10/2000    "C4, 5, 6 w/fusion" (02/17/2013)  . Lumbar spine surgery  2012    "bone graft, Dr. [name]" (02/17/2013)  . Insert / replace / remove pacemaker  02/17/2013  . Cardiac catheterization  2000's    "once" (02/17/2013)  . Cardioversion N/A 04/09/2014    Procedure: CARDIOVERSION;  Surgeon: Lelon Perla, MD;  Location: [facility] ENDOSCOPY;  Service: Cardiovascular;  Laterality: N/A;  . Permanent pacemaker insertion N/A 02/17/2013    Procedure: PERMANENT PACEMAKER INSERTION;  Surgeon: Deboraha Sprang, MD;  Location: [facility] CATH LAB;  Service: Cardiovascular;  Laterality: N/A;  . Cardioversion N/A 01/17/2015    Procedure: CARDIOVERSION;  Surgeon: Larey Dresser, MD;  Location: [place];  Service: Cardiovascular;  Laterality: N/A;    Current Outpatient Prescriptions  Medication Sig Dispense Refill  . allopurinol (ZYLOPRIM) 300 MG tablet Take 300 mg by mouth at bedtime.     Marland Kitchen apixaban (ELIQUIS) 5 MG TABS tablet Take 1 tablet (5 mg total) by mouth 2 (two) times daily. 180 tablet 3  . benzonatate (TESSALON) 100 MG capsule Take 1 capsule (100 mg total) by mouth 3 (three) times daily as needed for cough. 90 capsule 1  . Calcium Carbonate (CALTRATE 600 PO) Take 600 mg by mouth at bedtime.     . cholecalciferol (VITAMIN D) 1000 UNITS tablet  Take 1,000 Units by mouth at bedtime.    . colchicine 0.6 MG tablet Take 0.6 mg by mouth daily as needed (gout).     . cyanocobalamin 500 MCG tablet Take 500 mcg by mouth daily.    Marland Kitchen dofetilide (TIKOSYN) 250 MCG capsule Take 1 capsule (250 mcg total) by mouth 2 (two) times daily. 60 capsule 6  . doxazosin (CARDURA) 4 MG tablet Take 1 tablet (4 mg total) by mouth at bedtime. 30 tablet 11  . furosemide (LASIX) 40 MG tablet Take 1 tablet (40 mg total) by mouth daily. 90 tablet 3  . guaiFENesin (ROBITUSSIN) 100 MG/5ML SOLN Take 10 mLs by mouth every 4 (four) hours as needed for cough or to loosen phlegm.    Marland Kitchen levothyroxine  (SYNTHROID, LEVOTHROID) 112 MCG tablet Take 1 tablet (112 mcg total) by mouth daily before breakfast. 30 tablet 3  . loratadine (CLARITIN) 10 MG tablet Take 5 mg by mouth 2 (two) times daily as needed for allergies or rhinitis.     . Magnesium 400 MG CAPS Take 400 mg by mouth 2 (two) times daily.    . metoprolol tartrate (LOPRESSOR) 25 MG tablet Take 25 mg by mouth 2 (two) times daily.    . Misc Natural Products (SAW PALMETTO) CAPS Take 1 capsule by mouth daily.    Marland Kitchen OVER THE COUNTER MEDICATION Take 1 tablet by mouth daily. muscudine seed    . potassium chloride SA (K-DUR,KLOR-CON) 20 MEQ tablet Take 2 tablets (85meq) in the AM and 1 tablet (40meq) in the PM 90 tablet 12  . predniSONE (DELTASONE) 10 MG tablet Take 10 mg by mouth daily as needed (gout flare up).     . vitamin C (ASCORBIC ACID) 500 MG tablet Take 500 mg by mouth daily.      No current facility-administered medications for this encounter.    Allergies  Allergen Reactions  . Penicillins Shortness Of Breath and Rash    Ended up at ER after using  . Oxycodone Other (See Comments)    Altered mental status    Social History   Social History  . Marital Status: Married    Spouse Name: N/A  . Number of Children: 2  . Years of Education: 14   Occupational History  . retired    Social History Main Topics  . Smoking status: Former Smoker -- 1.00 packs/day for 12 years    Types: Cigarettes    Quit date: 03/11/1981  . Smokeless tobacco: Never Used  . Alcohol Use: No  . Drug Use: No  . Sexual Activity: Not on file   Other Topics Concern  . Not on file   Social History Narrative   Patient does not drink caffeine.   Patient is right handed.       Family History  Problem Relation Age of Onset  . Heart disease Father   . Heart attack Father   . Emphysema Mother   . Alcohol abuse Maternal Grandmother   . Alcohol abuse Maternal Grandfather   . Heart attack Paternal Grandfather   . Heart disease Paternal Grandfather    . Rheum arthritis Brother     ROS- All systems are reviewed and negative except as per the HPI above  Physical Exam: Filed Vitals:   12/09/15 1514  BP: 102/68  Pulse: 84  Height: 5\' 9"  (1.753 m)  Weight: 230 lb (104.327 kg)    GEN- The patient is well appearing, alert and oriented x 3 today.  Head- normocephalic, atraumatic Eyes-  Sclera clear, conjunctiva pink Ears- hearing intact Oropharynx- clear Neck- supple, no JVP Lymph- no cervical lymphadenopathy Lungs- Clear to ausculation bilaterally, normal work of breathing Heart- Regular rate and rhythm, no murmurs, rubs or gallops, PMI not laterally displaced GI- soft, NT, ND, + BS Extremities- no clubbing, cyanosis, or edema MS- no significant deformity or atrophy Skin- no rash or lesion Psych- euthymic mood, full affect Neuro- strength and sensation are intact  EKG-Atrial paced rhythm with prolonged AV conduction, inferior infarct, age undetermined. Automated EKG interpretation suggests acute MI,  this does fit the clinical picture with pt currently without chest pain. Reviewed with Dr. Aundra Dubin and changes thought to represent early repolarization and not acute MI, Similar st changes seen on previous EKG's. V. Rate 84 bpm, Pr int 222 ms, QRS int 82 ms, QTc 425 ms. Device interrogated, afib burden 3%, was in afib until  From Saturday til Sunday pm, returned to [place] but afib returned this am, until early afternoon now back in [place].Marland Kitchen  Assessment and Plan: 1. afib In SR on tikosyn, now, but recent breakthrough over the weekend Bmet/mag, tsh, cbc Continue xarelto  2. CHF Clinically stable Continue BB/ACE/diuretic Avoid salt/limit fluids  3. HTN Stable  4. PPM F/u with Dr. Caryl Comes July 2017  F/u in afib clinic in 3 months  Geroge Baseman. Hayley Horn, [facility] [address] [place], [place] [address] [phone]

## 2015-12-16 DIAGNOSIS — M1711 Unilateral primary osteoarthritis, right knee: Secondary | ICD-10-CM | POA: Diagnosis not present

## 2015-12-25 DIAGNOSIS — H409 Unspecified glaucoma: Secondary | ICD-10-CM | POA: Diagnosis present

## 2015-12-25 DIAGNOSIS — M1711 Unilateral primary osteoarthritis, right knee: Secondary | ICD-10-CM | POA: Diagnosis not present

## 2015-12-25 DIAGNOSIS — M24561 Contracture, right knee: Secondary | ICD-10-CM | POA: Diagnosis present

## 2015-12-25 DIAGNOSIS — G4730 Sleep apnea, unspecified: Secondary | ICD-10-CM | POA: Diagnosis present

## 2015-12-25 DIAGNOSIS — Z7952 Long term (current) use of systemic steroids: Secondary | ICD-10-CM | POA: Diagnosis not present

## 2015-12-25 DIAGNOSIS — Z79899 Other long term (current) drug therapy: Secondary | ICD-10-CM | POA: Diagnosis not present

## 2015-12-25 DIAGNOSIS — I4891 Unspecified atrial fibrillation: Secondary | ICD-10-CM | POA: Diagnosis present

## 2015-12-25 DIAGNOSIS — I712 Thoracic aortic aneurysm, without rupture: Secondary | ICD-10-CM | POA: Diagnosis present

## 2015-12-25 DIAGNOSIS — G8918 Other acute postprocedural pain: Secondary | ICD-10-CM | POA: Diagnosis not present

## 2015-12-25 DIAGNOSIS — E039 Hypothyroidism, unspecified: Secondary | ICD-10-CM | POA: Diagnosis present

## 2015-12-25 DIAGNOSIS — I509 Heart failure, unspecified: Secondary | ICD-10-CM | POA: Diagnosis present

## 2015-12-25 DIAGNOSIS — Z950 Presence of cardiac pacemaker: Secondary | ICD-10-CM | POA: Diagnosis not present

## 2015-12-25 DIAGNOSIS — I110 Hypertensive heart disease with heart failure: Secondary | ICD-10-CM | POA: Diagnosis present

## 2015-12-25 DIAGNOSIS — Z880 Allergy status to penicillin: Secondary | ICD-10-CM | POA: Diagnosis not present

## 2015-12-25 DIAGNOSIS — Z885 Allergy status to narcotic agent status: Secondary | ICD-10-CM | POA: Diagnosis not present

## 2015-12-28 DIAGNOSIS — Z96651 Presence of right artificial knee joint: Secondary | ICD-10-CM | POA: Diagnosis not present

## 2015-12-28 DIAGNOSIS — Z950 Presence of cardiac pacemaker: Secondary | ICD-10-CM | POA: Diagnosis not present

## 2015-12-28 DIAGNOSIS — N400 Enlarged prostate without lower urinary tract symptoms: Secondary | ICD-10-CM | POA: Diagnosis not present

## 2015-12-28 DIAGNOSIS — I4891 Unspecified atrial fibrillation: Secondary | ICD-10-CM | POA: Diagnosis not present

## 2015-12-28 DIAGNOSIS — I509 Heart failure, unspecified: Secondary | ICD-10-CM | POA: Diagnosis not present

## 2015-12-28 DIAGNOSIS — M150 Primary generalized (osteo)arthritis: Secondary | ICD-10-CM | POA: Diagnosis not present

## 2015-12-28 DIAGNOSIS — I10 Essential (primary) hypertension: Secondary | ICD-10-CM | POA: Diagnosis not present

## 2015-12-28 DIAGNOSIS — E039 Hypothyroidism, unspecified: Secondary | ICD-10-CM | POA: Diagnosis not present

## 2015-12-28 DIAGNOSIS — Z471 Aftercare following joint replacement surgery: Secondary | ICD-10-CM | POA: Diagnosis not present

## 2015-12-30 DIAGNOSIS — Z471 Aftercare following joint replacement surgery: Secondary | ICD-10-CM | POA: Diagnosis not present

## 2015-12-30 DIAGNOSIS — I10 Essential (primary) hypertension: Secondary | ICD-10-CM | POA: Diagnosis not present

## 2015-12-30 DIAGNOSIS — I509 Heart failure, unspecified: Secondary | ICD-10-CM | POA: Diagnosis not present

## 2015-12-30 DIAGNOSIS — N400 Enlarged prostate without lower urinary tract symptoms: Secondary | ICD-10-CM | POA: Diagnosis not present

## 2015-12-30 DIAGNOSIS — I4891 Unspecified atrial fibrillation: Secondary | ICD-10-CM | POA: Diagnosis not present

## 2015-12-30 DIAGNOSIS — Z96651 Presence of right artificial knee joint: Secondary | ICD-10-CM | POA: Diagnosis not present

## 2015-12-31 ENCOUNTER — Ambulatory Visit (INDEPENDENT_AMBULATORY_CARE_PROVIDER_SITE_OTHER): Payer: Medicare Other | Admitting: *Deleted

## 2015-12-31 DIAGNOSIS — I480 Paroxysmal atrial fibrillation: Secondary | ICD-10-CM

## 2015-12-31 DIAGNOSIS — R001 Bradycardia, unspecified: Secondary | ICD-10-CM

## 2015-12-31 DIAGNOSIS — Z950 Presence of cardiac pacemaker: Secondary | ICD-10-CM

## 2015-12-31 NOTE — Progress Notes (Signed)
Remote pacemaker transmission.   

## 2016-01-01 DIAGNOSIS — I10 Essential (primary) hypertension: Secondary | ICD-10-CM | POA: Diagnosis not present

## 2016-01-01 DIAGNOSIS — I4891 Unspecified atrial fibrillation: Secondary | ICD-10-CM | POA: Diagnosis not present

## 2016-01-01 DIAGNOSIS — Z471 Aftercare following joint replacement surgery: Secondary | ICD-10-CM | POA: Diagnosis not present

## 2016-01-01 DIAGNOSIS — Z96651 Presence of right artificial knee joint: Secondary | ICD-10-CM | POA: Diagnosis not present

## 2016-01-01 DIAGNOSIS — I509 Heart failure, unspecified: Secondary | ICD-10-CM | POA: Diagnosis not present

## 2016-01-01 DIAGNOSIS — N400 Enlarged prostate without lower urinary tract symptoms: Secondary | ICD-10-CM | POA: Diagnosis not present

## 2016-01-03 DIAGNOSIS — N400 Enlarged prostate without lower urinary tract symptoms: Secondary | ICD-10-CM | POA: Diagnosis not present

## 2016-01-03 DIAGNOSIS — I4891 Unspecified atrial fibrillation: Secondary | ICD-10-CM | POA: Diagnosis not present

## 2016-01-03 DIAGNOSIS — I10 Essential (primary) hypertension: Secondary | ICD-10-CM | POA: Diagnosis not present

## 2016-01-03 DIAGNOSIS — I509 Heart failure, unspecified: Secondary | ICD-10-CM | POA: Diagnosis not present

## 2016-01-03 DIAGNOSIS — Z96651 Presence of right artificial knee joint: Secondary | ICD-10-CM | POA: Diagnosis not present

## 2016-01-03 DIAGNOSIS — Z471 Aftercare following joint replacement surgery: Secondary | ICD-10-CM | POA: Diagnosis not present

## 2016-01-06 DIAGNOSIS — N400 Enlarged prostate without lower urinary tract symptoms: Secondary | ICD-10-CM | POA: Diagnosis not present

## 2016-01-06 DIAGNOSIS — I509 Heart failure, unspecified: Secondary | ICD-10-CM | POA: Diagnosis not present

## 2016-01-06 DIAGNOSIS — I10 Essential (primary) hypertension: Secondary | ICD-10-CM | POA: Diagnosis not present

## 2016-01-06 DIAGNOSIS — I4891 Unspecified atrial fibrillation: Secondary | ICD-10-CM | POA: Diagnosis not present

## 2016-01-06 DIAGNOSIS — Z96651 Presence of right artificial knee joint: Secondary | ICD-10-CM | POA: Diagnosis not present

## 2016-01-06 DIAGNOSIS — Z471 Aftercare following joint replacement surgery: Secondary | ICD-10-CM | POA: Diagnosis not present

## 2016-01-08 DIAGNOSIS — I509 Heart failure, unspecified: Secondary | ICD-10-CM | POA: Diagnosis not present

## 2016-01-08 DIAGNOSIS — I10 Essential (primary) hypertension: Secondary | ICD-10-CM | POA: Diagnosis not present

## 2016-01-08 DIAGNOSIS — N400 Enlarged prostate without lower urinary tract symptoms: Secondary | ICD-10-CM | POA: Diagnosis not present

## 2016-01-08 DIAGNOSIS — I4891 Unspecified atrial fibrillation: Secondary | ICD-10-CM | POA: Diagnosis not present

## 2016-01-08 DIAGNOSIS — Z96651 Presence of right artificial knee joint: Secondary | ICD-10-CM | POA: Diagnosis not present

## 2016-01-08 DIAGNOSIS — Z471 Aftercare following joint replacement surgery: Secondary | ICD-10-CM | POA: Diagnosis not present

## 2016-01-09 ENCOUNTER — Ambulatory Visit (HOSPITAL_COMMUNITY): Payer: Medicare Other | Admitting: Nurse Practitioner

## 2016-01-10 DIAGNOSIS — I509 Heart failure, unspecified: Secondary | ICD-10-CM | POA: Diagnosis not present

## 2016-01-10 DIAGNOSIS — Z471 Aftercare following joint replacement surgery: Secondary | ICD-10-CM | POA: Diagnosis not present

## 2016-01-10 DIAGNOSIS — I10 Essential (primary) hypertension: Secondary | ICD-10-CM | POA: Diagnosis not present

## 2016-01-10 DIAGNOSIS — N400 Enlarged prostate without lower urinary tract symptoms: Secondary | ICD-10-CM | POA: Diagnosis not present

## 2016-01-10 DIAGNOSIS — Z96651 Presence of right artificial knee joint: Secondary | ICD-10-CM | POA: Diagnosis not present

## 2016-01-10 DIAGNOSIS — I4891 Unspecified atrial fibrillation: Secondary | ICD-10-CM | POA: Diagnosis not present

## 2016-01-13 DIAGNOSIS — Z471 Aftercare following joint replacement surgery: Secondary | ICD-10-CM | POA: Diagnosis not present

## 2016-01-13 DIAGNOSIS — I4891 Unspecified atrial fibrillation: Secondary | ICD-10-CM | POA: Diagnosis not present

## 2016-01-13 DIAGNOSIS — I10 Essential (primary) hypertension: Secondary | ICD-10-CM | POA: Diagnosis not present

## 2016-01-13 DIAGNOSIS — Z96651 Presence of right artificial knee joint: Secondary | ICD-10-CM | POA: Diagnosis not present

## 2016-01-13 DIAGNOSIS — I509 Heart failure, unspecified: Secondary | ICD-10-CM | POA: Diagnosis not present

## 2016-01-13 DIAGNOSIS — N400 Enlarged prostate without lower urinary tract symptoms: Secondary | ICD-10-CM | POA: Diagnosis not present

## 2016-01-15 DIAGNOSIS — I10 Essential (primary) hypertension: Secondary | ICD-10-CM | POA: Diagnosis not present

## 2016-01-15 DIAGNOSIS — Z96651 Presence of right artificial knee joint: Secondary | ICD-10-CM | POA: Diagnosis not present

## 2016-01-15 DIAGNOSIS — I509 Heart failure, unspecified: Secondary | ICD-10-CM | POA: Diagnosis not present

## 2016-01-15 DIAGNOSIS — Z471 Aftercare following joint replacement surgery: Secondary | ICD-10-CM | POA: Diagnosis not present

## 2016-01-15 DIAGNOSIS — I4891 Unspecified atrial fibrillation: Secondary | ICD-10-CM | POA: Diagnosis not present

## 2016-01-15 DIAGNOSIS — N400 Enlarged prostate without lower urinary tract symptoms: Secondary | ICD-10-CM | POA: Diagnosis not present

## 2016-01-17 DIAGNOSIS — N400 Enlarged prostate without lower urinary tract symptoms: Secondary | ICD-10-CM | POA: Diagnosis not present

## 2016-01-17 DIAGNOSIS — I10 Essential (primary) hypertension: Secondary | ICD-10-CM | POA: Diagnosis not present

## 2016-01-17 DIAGNOSIS — Z96651 Presence of right artificial knee joint: Secondary | ICD-10-CM | POA: Diagnosis not present

## 2016-01-17 DIAGNOSIS — I4891 Unspecified atrial fibrillation: Secondary | ICD-10-CM | POA: Diagnosis not present

## 2016-01-17 DIAGNOSIS — Z471 Aftercare following joint replacement surgery: Secondary | ICD-10-CM | POA: Diagnosis not present

## 2016-01-17 DIAGNOSIS — I509 Heart failure, unspecified: Secondary | ICD-10-CM | POA: Diagnosis not present

## 2016-01-20 DIAGNOSIS — M25561 Pain in right knee: Secondary | ICD-10-CM | POA: Diagnosis not present

## 2016-01-20 DIAGNOSIS — M6281 Muscle weakness (generalized): Secondary | ICD-10-CM | POA: Diagnosis not present

## 2016-01-20 DIAGNOSIS — M25661 Stiffness of right knee, not elsewhere classified: Secondary | ICD-10-CM | POA: Diagnosis not present

## 2016-01-23 ENCOUNTER — Inpatient Hospital Stay (HOSPITAL_COMMUNITY): Admission: RE | Admit: 2016-01-23 | Payer: Medicare Other | Source: Ambulatory Visit | Admitting: Nurse Practitioner

## 2016-01-24 DIAGNOSIS — M6281 Muscle weakness (generalized): Secondary | ICD-10-CM | POA: Diagnosis not present

## 2016-01-24 DIAGNOSIS — M25661 Stiffness of right knee, not elsewhere classified: Secondary | ICD-10-CM | POA: Diagnosis not present

## 2016-01-24 DIAGNOSIS — M25561 Pain in right knee: Secondary | ICD-10-CM | POA: Diagnosis not present

## 2016-01-28 DIAGNOSIS — Z96651 Presence of right artificial knee joint: Secondary | ICD-10-CM | POA: Diagnosis not present

## 2016-01-28 DIAGNOSIS — Z471 Aftercare following joint replacement surgery: Secondary | ICD-10-CM | POA: Diagnosis not present

## 2016-01-29 ENCOUNTER — Ambulatory Visit (HOSPITAL_COMMUNITY)
Admission: RE | Admit: 2016-01-29 | Discharge: 2016-01-29 | Disposition: A | Discharge status: Home / Self Care | Payer: Medicare Other | Source: Ambulatory Visit | Attending: Nurse Practitioner | Admitting: Nurse Practitioner

## 2016-01-29 ENCOUNTER — Encounter (HOSPITAL_COMMUNITY): Payer: Self-pay | Admitting: Nurse Practitioner

## 2016-01-29 VITALS — BP 128/60 | HR 79 | Ht 69.0 in | Wt 217.4 lb

## 2016-01-29 DIAGNOSIS — Z885 Allergy status to narcotic agent status: Secondary | ICD-10-CM | POA: Insufficient documentation

## 2016-01-29 DIAGNOSIS — I495 Sick sinus syndrome: Secondary | ICD-10-CM | POA: Diagnosis not present

## 2016-01-29 DIAGNOSIS — I509 Heart failure, unspecified: Secondary | ICD-10-CM | POA: Insufficient documentation

## 2016-01-29 DIAGNOSIS — Z7901 Long term (current) use of anticoagulants: Secondary | ICD-10-CM | POA: Diagnosis not present

## 2016-01-29 DIAGNOSIS — D649 Anemia, unspecified: Secondary | ICD-10-CM | POA: Diagnosis not present

## 2016-01-29 DIAGNOSIS — R42 Dizziness and giddiness: Secondary | ICD-10-CM | POA: Diagnosis not present

## 2016-01-29 DIAGNOSIS — Z8249 Family history of ischemic heart disease and other diseases of the circulatory system: Secondary | ICD-10-CM | POA: Insufficient documentation

## 2016-01-29 DIAGNOSIS — I712 Thoracic aortic aneurysm, without rupture: Secondary | ICD-10-CM | POA: Insufficient documentation

## 2016-01-29 DIAGNOSIS — Z950 Presence of cardiac pacemaker: Secondary | ICD-10-CM | POA: Diagnosis not present

## 2016-01-29 DIAGNOSIS — Z79899 Other long term (current) drug therapy: Secondary | ICD-10-CM | POA: Diagnosis not present

## 2016-01-29 DIAGNOSIS — I480 Paroxysmal atrial fibrillation: Secondary | ICD-10-CM

## 2016-01-29 DIAGNOSIS — Z87891 Personal history of nicotine dependence: Secondary | ICD-10-CM | POA: Insufficient documentation

## 2016-01-29 DIAGNOSIS — Z880 Allergy status to penicillin: Secondary | ICD-10-CM | POA: Diagnosis not present

## 2016-01-29 DIAGNOSIS — Z825 Family history of asthma and other chronic lower respiratory diseases: Secondary | ICD-10-CM | POA: Diagnosis not present

## 2016-01-29 DIAGNOSIS — I110 Hypertensive heart disease with heart failure: Secondary | ICD-10-CM | POA: Insufficient documentation

## 2016-01-29 DIAGNOSIS — G4730 Sleep apnea, unspecified: Secondary | ICD-10-CM | POA: Insufficient documentation

## 2016-01-29 DIAGNOSIS — M109 Gout, unspecified: Secondary | ICD-10-CM | POA: Diagnosis not present

## 2016-01-29 DIAGNOSIS — E039 Hypothyroidism, unspecified: Secondary | ICD-10-CM | POA: Insufficient documentation

## 2016-01-29 DIAGNOSIS — I4891 Unspecified atrial fibrillation: Secondary | ICD-10-CM | POA: Insufficient documentation

## 2016-01-29 DIAGNOSIS — Z96651 Presence of right artificial knee joint: Secondary | ICD-10-CM | POA: Diagnosis not present

## 2016-01-29 DIAGNOSIS — R0600 Dyspnea, unspecified: Secondary | ICD-10-CM | POA: Insufficient documentation

## 2016-01-29 LAB — CBC
HEMATOCRIT: 36.2 % — AB (ref 39.0–52.0)
Hemoglobin: 11.4 g/dL — ABNORMAL LOW (ref 13.0–17.0)
MCH: 29.1 pg (ref 26.0–34.0)
MCHC: 31.5 g/dL (ref 30.0–36.0)
MCV: 92.3 fL (ref 78.0–100.0)
PLATELETS: 141 10*3/uL — AB (ref 150–400)
RBC: 3.92 MIL/uL — ABNORMAL LOW (ref 4.22–5.81)
RDW: 14.8 % (ref 11.5–15.5)
WBC: 3.8 10*3/uL — AB (ref 4.0–10.5)

## 2016-01-29 LAB — COMPREHENSIVE METABOLIC PANEL
ALT: 12 U/L — ABNORMAL LOW (ref 17–63)
AST: 18 U/L (ref 15–41)
Albumin: 3.5 g/dL (ref 3.5–5.0)
Alkaline Phosphatase: 61 U/L (ref 38–126)
Anion gap: 7 (ref 5–15)
BILIRUBIN TOTAL: 0.9 mg/dL (ref 0.3–1.2)
BUN: 13 mg/dL (ref 6–20)
CO2: 29 mmol/L (ref 22–32)
Calcium: 9.3 mg/dL (ref 8.9–10.3)
Chloride: 103 mmol/L (ref 101–111)
Creatinine, Ser: 1.22 mg/dL (ref 0.61–1.24)
GFR, EST NON AFRICAN AMERICAN: 55 mL/min — AB (ref >60)
Glucose, Bld: 98 mg/dL (ref 65–99)
POTASSIUM: 4.4 mmol/L (ref 3.5–5.1)
Sodium: 139 mmol/L (ref 135–145)
Total Protein: 6.1 g/dL — ABNORMAL LOW (ref 6.5–8.1)

## 2016-01-29 LAB — MAGNESIUM: MAGNESIUM: 1.7 mg/dL (ref 1.7–2.4)

## 2016-01-29 NOTE — Progress Notes (Signed)
Patient ID: Don Jacobs, male   DOB: [birth year], 80 y.o.   MRN: [id number]     Primary Care Physician: Henrine Screws, MD Referring Physician:Dr. Rayann Heman   Don Jacobs is a 80 y.o. male with a h/o PPM, CHF, persistent afib maintaining SR on tikosyn. Very little breakthrough afib. He denies any episodes of afib. Weight is stable. Avoiding salt and limiting fluids.   He returns to clinic 5 weeks s/p rt knee replacement. He did not have any issues with afib. He feels mildly dizzy and short of breath but weight is stable. He took himself off lasix since his weight dropped after surgery due to poor appetite. He has finished PT and hopes to get back to gym soon.   Today, he denies symptoms of palpitations, chest pain,  orthopnea, PND, lower extremity edema, dizziness, presyncope, syncope, or neurologic sequela. MIld dyspnea, mild dizziness. The patient is tolerating medications without difficulties and is otherwise without complaint today.   Past Medical History  Diagnosis Date  . Hypertension   . Persistent atrial fibrillation ([place])   . Sleep apnea   . Gout   . Hypothyroidism   . Allergic rhinitis   . ED (erectile dysfunction)   . BPH (benign prostatic hyperplasia)   . Hearing loss     uses hearing a cyst  . Hx of migraines   . Prostatitis, chronic   . Spondylolisthesis 07/2010  . Seborrheic keratosis   . Aortic aneurysm, thoracic ([place]) 05/25/2011  . Sick sinus syndrome ([facility])     a. s/p PPM   . Chronic systolic CHF (congestive heart failure) ([place])   . Dizziness and giddiness 06/04/2015   Past Surgical History  Procedure Laterality Date  . Back surgery    . L5 selective nerve root block      Dr Nelva Bush  . Foot surgery Right ~ 2007    "reconstruction" (02/17/2013)  . Shoulder open rotator cuff repair Right 09/2007; 2013  . Repair peroneal tendons ankle Right 10/2003  . Knee arthroscopy Left ~ 2007  . Anterior cervical decomp/discectomy fusion  10/2000    "C4, 5, 6 w/fusion"  (02/17/2013)  . Lumbar spine surgery  2012    "bone graft, Dr. [name]" (02/17/2013)  . Insert / replace / remove pacemaker  02/17/2013  . Cardiac catheterization  2000's    "once" (02/17/2013)  . Cardioversion N/A 04/09/2014    Procedure: CARDIOVERSION;  Surgeon: Lelon Perla, MD;  Location: [facility] ENDOSCOPY;  Service: Cardiovascular;  Laterality: N/A;  . Permanent pacemaker insertion N/A 02/17/2013    Procedure: PERMANENT PACEMAKER INSERTION;  Surgeon: Deboraha Sprang, MD;  Location: [facility] CATH LAB;  Service: Cardiovascular;  Laterality: N/A;  . Cardioversion N/A 01/17/2015    Procedure: CARDIOVERSION;  Surgeon: Larey Dresser, MD;  Location: [place];  Service: Cardiovascular;  Laterality: N/A;    Current Outpatient Prescriptions  Medication Sig Dispense Refill  . allopurinol (ZYLOPRIM) 300 MG tablet Take 300 mg by mouth at bedtime.     Marland Kitchen apixaban (ELIQUIS) 5 MG TABS tablet Take 1 tablet (5 mg total) by mouth 2 (two) times daily. 180 tablet 3  . benzonatate (TESSALON) 100 MG capsule Take 1 capsule (100 mg total) by mouth 3 (three) times daily as needed for cough. 90 capsule 1  . Calcium Carbonate (CALTRATE 600 PO) Take 600 mg by mouth at bedtime.     . cholecalciferol (VITAMIN D) 1000 UNITS tablet Take 1,000 Units by mouth at bedtime.    Marland Kitchen  colchicine 0.6 MG tablet Take 0.6 mg by mouth daily as needed (gout).     . cyanocobalamin 500 MCG tablet Take 500 mcg by mouth daily.    Marland Kitchen dofetilide (TIKOSYN) 250 MCG capsule Take 1 capsule (250 mcg total) by mouth 2 (two) times daily. 60 capsule 6  . doxazosin (CARDURA) 4 MG tablet Take 1 tablet (4 mg total) by mouth at bedtime. 30 tablet 11  . furosemide (LASIX) 40 MG tablet Take 1 tablet (40 mg total) by mouth daily. 90 tablet 3  . guaiFENesin (ROBITUSSIN) 100 MG/5ML SOLN Take 10 mLs by mouth every 4 (four) hours as needed for cough or to loosen phlegm.    Marland Kitchen levothyroxine (SYNTHROID, LEVOTHROID) 112 MCG tablet Take 1 tablet (112 mcg total) by mouth  daily before breakfast. 30 tablet 3  . loratadine (CLARITIN) 10 MG tablet Take 5 mg by mouth 2 (two) times daily as needed for allergies or rhinitis.     . Magnesium 400 MG CAPS Take 400 mg by mouth 2 (two) times daily.    . metoprolol tartrate (LOPRESSOR) 25 MG tablet Take 25 mg by mouth 2 (two) times daily.    . Misc Natural Products (SAW PALMETTO) CAPS Take 1 capsule by mouth daily.    Marland Kitchen OVER THE COUNTER MEDICATION Take 1 tablet by mouth daily. muscudine seed    . potassium chloride SA (K-DUR,KLOR-CON) 20 MEQ tablet Take 2 tablets (58meq) in the AM and 1 tablet (38meq) in the PM 90 tablet 12  . predniSONE (DELTASONE) 10 MG tablet Take 10 mg by mouth daily as needed (gout flare up).     . vitamin C (ASCORBIC ACID) 500 MG tablet Take 500 mg by mouth daily.      No current facility-administered medications for this encounter.    Allergies  Allergen Reactions  . Penicillins Shortness Of Breath and Rash    Ended up at ER after using  . Oxycodone Other (See Comments)    Altered mental status    Social History   Social History  . Marital Status: Married    Spouse Name: N/A  . Number of Children: 2  . Years of Education: 14   Occupational History  . retired    Social History Main Topics  . Smoking status: Former Smoker -- 1.00 packs/day for 12 years    Types: Cigarettes    Quit date: 03/11/1981  . Smokeless tobacco: Never Used  . Alcohol Use: No  . Drug Use: No  . Sexual Activity: Not on file   Other Topics Concern  . Not on file   Social History Narrative   Patient does not drink caffeine.   Patient is right handed.       Family History  Problem Relation Age of Onset  . Heart disease Father   . Heart attack Father   . Emphysema Mother   . Alcohol abuse Maternal Grandmother   . Alcohol abuse Maternal Grandfather   . Heart attack Paternal Grandfather   . Heart disease Paternal Grandfather   . Rheum arthritis Brother     ROS- All systems are reviewed and  negative except as per the HPI above  Physical Exam: Filed Vitals:   01/29/16 1106  BP: 128/60  Pulse: 79  Height: 5\' 9"  (1.753 m)  Weight: 217 lb 6.4 oz (98.612 kg)    GEN- The patient is well appearing, alert and oriented x 3 today.   Head- normocephalic, atraumatic Eyes-  Sclera clear, conjunctiva pink  Ears- hearing intact Oropharynx- clear Neck- supple, no JVP Lymph- no cervical lymphadenopathy Lungs- Clear to ausculation bilaterally, normal work of breathing Heart- Regular rate and rhythm, no murmurs, rubs or gallops, PMI not laterally displaced GI- soft, NT, ND, + BS Extremities- no clubbing, cyanosis, or edema MS- no significant deformity or atrophy Skin- no rash or lesion Psych- euthymic mood, full affect Neuro- strength and sensation are intact  EKG-Atrial paced rhythm with prolonged AV conduction, v rate at 79 bpm, pr int 246 ms, qrs int 84 ms, qtc 426 ms  V. Rate 84 bpm, Pr int 222 ms, QRS int 82 ms, QTc 425 ms.   Assessment and Plan: 1. afib In SR on tikosyn  Bmet, mag Continue xarelto  2. CHF, mild dyspnea Clinically stable Continue BB/ACE/diuretic Avoid salt/limit fluids Can try to return to lasix 20 mg qod  3. HTN Stable  4. PPM F/u with Dr. Caryl Comes July 2017  5. Mild dizziness CBC, possibly mildly anemic   F/u  Dr. Caryl Comes in July as scheduled afib clinic as needed  Geroge Baseman. Keani Gotcher, [facility] [address] [place], [place] [address] [phone]

## 2016-01-29 NOTE — Patient Instructions (Signed)
Resume lasix 20mg  every other day

## 2016-01-30 DIAGNOSIS — M25561 Pain in right knee: Secondary | ICD-10-CM | POA: Diagnosis not present

## 2016-01-30 DIAGNOSIS — M25661 Stiffness of right knee, not elsewhere classified: Secondary | ICD-10-CM | POA: Diagnosis not present

## 2016-01-30 DIAGNOSIS — M6281 Muscle weakness (generalized): Secondary | ICD-10-CM | POA: Diagnosis not present

## 2016-02-06 DIAGNOSIS — M25561 Pain in right knee: Secondary | ICD-10-CM | POA: Diagnosis not present

## 2016-02-06 DIAGNOSIS — M25661 Stiffness of right knee, not elsewhere classified: Secondary | ICD-10-CM | POA: Diagnosis not present

## 2016-02-06 DIAGNOSIS — M6281 Muscle weakness (generalized): Secondary | ICD-10-CM | POA: Diagnosis not present

## 2016-02-10 DIAGNOSIS — M6281 Muscle weakness (generalized): Secondary | ICD-10-CM | POA: Diagnosis not present

## 2016-02-10 DIAGNOSIS — M25561 Pain in right knee: Secondary | ICD-10-CM | POA: Diagnosis not present

## 2016-02-10 DIAGNOSIS — M25661 Stiffness of right knee, not elsewhere classified: Secondary | ICD-10-CM | POA: Diagnosis not present

## 2016-02-12 ENCOUNTER — Encounter: Payer: Self-pay | Admitting: Cardiology

## 2016-02-12 ENCOUNTER — Ambulatory Visit (HOSPITAL_COMMUNITY)
Admission: RE | Admit: 2016-02-12 | Discharge: 2016-02-12 | Disposition: A | Discharge status: Home / Self Care | Payer: Medicare Other | Source: Ambulatory Visit | Attending: Nurse Practitioner | Admitting: Nurse Practitioner

## 2016-02-12 DIAGNOSIS — I4891 Unspecified atrial fibrillation: Secondary | ICD-10-CM | POA: Diagnosis not present

## 2016-02-12 DIAGNOSIS — I480 Paroxysmal atrial fibrillation: Secondary | ICD-10-CM

## 2016-02-12 LAB — CUP PACEART REMOTE DEVICE CHECK
Brady Statistic AP VS Percent: 73.29 %
Brady Statistic AS VS Percent: 26.62 %
Brady Statistic RV Percent Paced: 0.09 %
Date Time Interrogation Session: 20170404134429
Implantable Lead Implant Date: 20140523
Implantable Lead Model: 5076
Lead Channel Impedance Value: 304 Ohm
Lead Channel Pacing Threshold Amplitude: 0.875 V
Lead Channel Pacing Threshold Pulse Width: 0.4 ms
Lead Channel Sensing Intrinsic Amplitude: 1.25 mV
Lead Channel Sensing Intrinsic Amplitude: 8.25 mV
Lead Channel Sensing Intrinsic Amplitude: 8.25 mV
Lead Channel Setting Pacing Amplitude: 2.5 V
MDC IDC LEAD IMPLANT DT: 20140523
MDC IDC LEAD LOCATION: 753859
MDC IDC LEAD LOCATION: 753860
MDC IDC MSMT BATTERY REMAINING LONGEVITY: 77 mo
MDC IDC MSMT BATTERY VOLTAGE: 3.02 V
MDC IDC MSMT LEADCHNL RA IMPEDANCE VALUE: 323 Ohm
MDC IDC MSMT LEADCHNL RA PACING THRESHOLD AMPLITUDE: 0.5 V
MDC IDC MSMT LEADCHNL RA PACING THRESHOLD PULSEWIDTH: 0.4 ms
MDC IDC MSMT LEADCHNL RA SENSING INTR AMPL: 1.25 mV
MDC IDC MSMT LEADCHNL RV IMPEDANCE VALUE: 418 Ohm
MDC IDC MSMT LEADCHNL RV IMPEDANCE VALUE: 475 Ohm
MDC IDC SET LEADCHNL RA PACING AMPLITUDE: 2 V
MDC IDC SET LEADCHNL RV PACING PULSEWIDTH: 0.4 ms
MDC IDC SET LEADCHNL RV SENSING SENSITIVITY: 4 mV
MDC IDC STAT BRADY AP VP PERCENT: 0.07 %
MDC IDC STAT BRADY AS VP PERCENT: 0.02 %
MDC IDC STAT BRADY RA PERCENT PACED: 73.36 %

## 2016-02-12 LAB — MAGNESIUM: MAGNESIUM: 2 mg/dL (ref 1.7–2.4)

## 2016-02-13 DIAGNOSIS — M6281 Muscle weakness (generalized): Secondary | ICD-10-CM | POA: Diagnosis not present

## 2016-02-13 DIAGNOSIS — M25661 Stiffness of right knee, not elsewhere classified: Secondary | ICD-10-CM | POA: Diagnosis not present

## 2016-02-13 DIAGNOSIS — M25561 Pain in right knee: Secondary | ICD-10-CM | POA: Diagnosis not present

## 2016-02-17 DIAGNOSIS — M25561 Pain in right knee: Secondary | ICD-10-CM | POA: Diagnosis not present

## 2016-02-17 DIAGNOSIS — M25661 Stiffness of right knee, not elsewhere classified: Secondary | ICD-10-CM | POA: Diagnosis not present

## 2016-02-17 DIAGNOSIS — M6281 Muscle weakness (generalized): Secondary | ICD-10-CM | POA: Diagnosis not present

## 2016-02-20 DIAGNOSIS — M25561 Pain in right knee: Secondary | ICD-10-CM | POA: Diagnosis not present

## 2016-02-20 DIAGNOSIS — M6281 Muscle weakness (generalized): Secondary | ICD-10-CM | POA: Diagnosis not present

## 2016-02-20 DIAGNOSIS — M25661 Stiffness of right knee, not elsewhere classified: Secondary | ICD-10-CM | POA: Diagnosis not present

## 2016-02-27 DIAGNOSIS — M25661 Stiffness of right knee, not elsewhere classified: Secondary | ICD-10-CM | POA: Diagnosis not present

## 2016-02-27 DIAGNOSIS — M6281 Muscle weakness (generalized): Secondary | ICD-10-CM | POA: Diagnosis not present

## 2016-02-27 DIAGNOSIS — M25561 Pain in right knee: Secondary | ICD-10-CM | POA: Diagnosis not present

## 2016-03-02 DIAGNOSIS — M6281 Muscle weakness (generalized): Secondary | ICD-10-CM | POA: Diagnosis not present

## 2016-03-02 DIAGNOSIS — M25661 Stiffness of right knee, not elsewhere classified: Secondary | ICD-10-CM | POA: Diagnosis not present

## 2016-03-02 DIAGNOSIS — M25561 Pain in right knee: Secondary | ICD-10-CM | POA: Diagnosis not present

## 2016-03-06 DIAGNOSIS — M47896 Other spondylosis, lumbar region: Secondary | ICD-10-CM | POA: Diagnosis not present

## 2016-03-06 DIAGNOSIS — Z96651 Presence of right artificial knee joint: Secondary | ICD-10-CM | POA: Diagnosis not present

## 2016-03-06 DIAGNOSIS — Z471 Aftercare following joint replacement surgery: Secondary | ICD-10-CM | POA: Diagnosis not present

## 2016-03-13 DIAGNOSIS — M9901 Segmental and somatic dysfunction of cervical region: Secondary | ICD-10-CM | POA: Diagnosis not present

## 2016-03-13 DIAGNOSIS — M9903 Segmental and somatic dysfunction of lumbar region: Secondary | ICD-10-CM | POA: Diagnosis not present

## 2016-03-13 DIAGNOSIS — M9904 Segmental and somatic dysfunction of sacral region: Secondary | ICD-10-CM | POA: Diagnosis not present

## 2016-03-13 DIAGNOSIS — M9905 Segmental and somatic dysfunction of pelvic region: Secondary | ICD-10-CM | POA: Diagnosis not present

## 2016-03-16 DIAGNOSIS — M9901 Segmental and somatic dysfunction of cervical region: Secondary | ICD-10-CM | POA: Diagnosis not present

## 2016-03-16 DIAGNOSIS — M9905 Segmental and somatic dysfunction of pelvic region: Secondary | ICD-10-CM | POA: Diagnosis not present

## 2016-03-16 DIAGNOSIS — M9904 Segmental and somatic dysfunction of sacral region: Secondary | ICD-10-CM | POA: Diagnosis not present

## 2016-03-16 DIAGNOSIS — M9903 Segmental and somatic dysfunction of lumbar region: Secondary | ICD-10-CM | POA: Diagnosis not present

## 2016-03-20 DIAGNOSIS — M9903 Segmental and somatic dysfunction of lumbar region: Secondary | ICD-10-CM | POA: Diagnosis not present

## 2016-03-20 DIAGNOSIS — M9901 Segmental and somatic dysfunction of cervical region: Secondary | ICD-10-CM | POA: Diagnosis not present

## 2016-03-20 DIAGNOSIS — M9905 Segmental and somatic dysfunction of pelvic region: Secondary | ICD-10-CM | POA: Diagnosis not present

## 2016-03-20 DIAGNOSIS — M9904 Segmental and somatic dysfunction of sacral region: Secondary | ICD-10-CM | POA: Diagnosis not present

## 2016-03-26 DIAGNOSIS — I4891 Unspecified atrial fibrillation: Secondary | ICD-10-CM | POA: Diagnosis not present

## 2016-03-26 DIAGNOSIS — I719 Aortic aneurysm of unspecified site, without rupture: Secondary | ICD-10-CM | POA: Diagnosis not present

## 2016-03-26 DIAGNOSIS — D696 Thrombocytopenia, unspecified: Secondary | ICD-10-CM | POA: Diagnosis not present

## 2016-03-26 DIAGNOSIS — Z950 Presence of cardiac pacemaker: Secondary | ICD-10-CM | POA: Diagnosis not present

## 2016-03-26 DIAGNOSIS — N529 Male erectile dysfunction, unspecified: Secondary | ICD-10-CM | POA: Diagnosis not present

## 2016-03-26 DIAGNOSIS — Z1389 Encounter for screening for other disorder: Secondary | ICD-10-CM | POA: Diagnosis not present

## 2016-03-26 DIAGNOSIS — Z79899 Other long term (current) drug therapy: Secondary | ICD-10-CM | POA: Diagnosis not present

## 2016-03-26 DIAGNOSIS — N400 Enlarged prostate without lower urinary tract symptoms: Secondary | ICD-10-CM | POA: Diagnosis not present

## 2016-03-26 DIAGNOSIS — B351 Tinea unguium: Secondary | ICD-10-CM | POA: Diagnosis not present

## 2016-03-26 DIAGNOSIS — G4730 Sleep apnea, unspecified: Secondary | ICD-10-CM | POA: Diagnosis not present

## 2016-03-26 DIAGNOSIS — E039 Hypothyroidism, unspecified: Secondary | ICD-10-CM | POA: Diagnosis not present

## 2016-03-26 DIAGNOSIS — I10 Essential (primary) hypertension: Secondary | ICD-10-CM | POA: Diagnosis not present

## 2016-04-09 ENCOUNTER — Encounter: Payer: Self-pay | Admitting: Internal Medicine

## 2016-04-09 ENCOUNTER — Ambulatory Visit (INDEPENDENT_AMBULATORY_CARE_PROVIDER_SITE_OTHER): Payer: Medicare Other | Admitting: Internal Medicine

## 2016-04-09 VITALS — BP 102/70 | HR 63 | Ht 69.0 in | Wt 214.2 lb

## 2016-04-09 DIAGNOSIS — I495 Sick sinus syndrome: Secondary | ICD-10-CM

## 2016-04-09 DIAGNOSIS — I5030 Unspecified diastolic (congestive) heart failure: Secondary | ICD-10-CM

## 2016-04-09 DIAGNOSIS — Z950 Presence of cardiac pacemaker: Secondary | ICD-10-CM | POA: Diagnosis not present

## 2016-04-09 DIAGNOSIS — I480 Paroxysmal atrial fibrillation: Secondary | ICD-10-CM | POA: Diagnosis not present

## 2016-04-09 DIAGNOSIS — Z79899 Other long term (current) drug therapy: Secondary | ICD-10-CM

## 2016-04-09 LAB — CUP PACEART INCLINIC DEVICE CHECK
Battery Voltage: 3 V
Brady Statistic AP VP Percent: 0.07 %
Brady Statistic AS VP Percent: 0.02 %
Brady Statistic AS VS Percent: 20.34 %
Brady Statistic RV Percent Paced: 0.09 %
Implantable Lead Implant Date: 20140523
Implantable Lead Location: 753859
Lead Channel Impedance Value: 380 Ohm
Lead Channel Impedance Value: 437 Ohm
Lead Channel Pacing Threshold Amplitude: 0.75 V
Lead Channel Pacing Threshold Amplitude: 1 V
Lead Channel Sensing Intrinsic Amplitude: 9.25 mV
Lead Channel Sensing Intrinsic Amplitude: 9.25 mV
Lead Channel Setting Pacing Amplitude: 2 V
Lead Channel Setting Pacing Amplitude: 2.5 V
Lead Channel Setting Pacing Pulse Width: 0.4 ms
MDC IDC LEAD IMPLANT DT: 20140523
MDC IDC LEAD LOCATION: 753860
MDC IDC MSMT BATTERY REMAINING LONGEVITY: 68 mo
MDC IDC MSMT LEADCHNL RA IMPEDANCE VALUE: 323 Ohm
MDC IDC MSMT LEADCHNL RA PACING THRESHOLD PULSEWIDTH: 0.4 ms
MDC IDC MSMT LEADCHNL RA SENSING INTR AMPL: 1.375 mV
MDC IDC MSMT LEADCHNL RA SENSING INTR AMPL: 1.5 mV
MDC IDC MSMT LEADCHNL RV IMPEDANCE VALUE: 494 Ohm
MDC IDC MSMT LEADCHNL RV PACING THRESHOLD PULSEWIDTH: 0.4 ms
MDC IDC SESS DTM: 20170713152659
MDC IDC SET LEADCHNL RV SENSING SENSITIVITY: 4 mV
MDC IDC STAT BRADY AP VS PERCENT: 79.57 %
MDC IDC STAT BRADY RA PERCENT PACED: 79.64 %

## 2016-04-09 MED ORDER — POTASSIUM CHLORIDE CRYS ER 20 MEQ PO TBCR: 40.0000 meq | EXTENDED_RELEASE_TABLET | Freq: Every day | ORAL | Status: DC

## 2016-04-09 NOTE — Progress Notes (Signed)
skf      Patient Care Team: [name], MD as PCP - General (Internal Medicine) Jettie Booze, MD (Cardiology)   HPI  Don Jacobs is a 80 y.o. male Seen following pacemaker implantation 5/14 for paroxysmal atrial fibrillation with a rapid rate associated with symptomatic post termination pausing and presyncope. He also has underlying bradycardia.  His CHADS-VASc score is 4, age-20, hypertension-1, and vascular disease- 1.   He is currently on apixaban without bleeding issues  He previously had taking amiodarone and flecainide he did not tolerate. He was started most recently on dofetilide-4/16 which she has tolerated so far. He saw Dr. Greggory Brandy 6/16 with the decision to continue medical therapy with a low threshold for proceeding with catheter ablation  He has been following up in the A. fib clinic. They have a weight loss program ongoing. He has lost 40 pounds.  No symptomatic interval atrial fibrillation since having been seen 3/17.  He underwent knee replacement surgery    Past Medical History  Diagnosis Date  . Hypertension   . Persistent atrial fibrillation ([place])   . Sleep apnea   . Gout   . Hypothyroidism   . Allergic rhinitis   . ED (erectile dysfunction)   . BPH (benign prostatic hyperplasia)   . Hearing loss     uses hearing a cyst  . Hx of migraines   . Prostatitis, chronic   . Spondylolisthesis 07/2010  . Seborrheic keratosis   . Aortic aneurysm, thoracic ([place]) 05/25/2011  . Sick sinus syndrome ([facility])     a. s/p PPM   . Chronic systolic CHF (congestive heart failure) ([place])   . Dizziness and giddiness 06/04/2015    Past Surgical History  Procedure Laterality Date  . Back surgery    . L5 selective nerve root block      Dr Nelva Bush  . Foot surgery Right ~ 2007    "reconstruction" (02/17/2013)  . Shoulder open rotator cuff repair Right 09/2007; 2013  . Repair peroneal tendons ankle Right 10/2003  . Knee arthroscopy Left ~ 2007  . Anterior cervical  decomp/discectomy fusion  10/2000    "C4, 5, 6 w/fusion" (02/17/2013)  . Lumbar spine surgery  2012    "bone graft, Dr. [name]" (02/17/2013)  . Insert / replace / remove pacemaker  02/17/2013  . Cardiac catheterization  2000's    "once" (02/17/2013)  . Cardioversion N/A 04/09/2014    Procedure: CARDIOVERSION;  Surgeon: Lelon Perla, MD;  [facility] ENDOSCOPY;  Service: Cardiovascular;  Laterality: N/A;  . Permanent pacemaker insertion N/A 02/17/2013    Procedure: PERMANENT PACEMAKER INSERTION;  Surgeon: Deboraha Sprang, MD;  Location: [facility] CATH LAB;  Service: Cardiovascular;  Laterality: N/A;  . Cardioversion N/A 01/17/2015    Procedure: CARDIOVERSION;  Surgeon: Larey Dresser, MD;  Location: [place];  Service: Cardiovascular;  Laterality: N/A;    Current Outpatient Prescriptions  Medication Sig Dispense Refill  . allopurinol (ZYLOPRIM) 300 MG tablet Take 300 mg by mouth at bedtime.     Marland Kitchen apixaban (ELIQUIS) 5 MG TABS tablet Take 1 tablet (5 mg total) by mouth 2 (two) times daily. 180 tablet 3  . benzonatate (TESSALON) 100 MG capsule Take 1 capsule (100 mg total) by mouth 3 (three) times daily as needed for cough. 90 capsule 1  . Calcium Carbonate (CALTRATE 600 PO) Take 600 mg by mouth at bedtime.     . cholecalciferol (VITAMIN D) 1000 UNITS tablet Take 1,000 Units by mouth at  bedtime.    . colchicine 0.6 MG tablet Take 0.6 mg by mouth daily as needed (gout).     . cyanocobalamin 500 MCG tablet Take 500 mcg by mouth daily.    Marland Kitchen dofetilide (TIKOSYN) 250 MCG capsule Take 1 capsule (250 mcg total) by mouth 2 (two) times daily. 60 capsule 6  . doxazosin (CARDURA) 4 MG tablet Take 1 tablet (4 mg total) by mouth at bedtime. 30 tablet 11  . furosemide (LASIX) 40 MG tablet Take 1 tablet (40 mg total) by mouth daily. 90 tablet 3  . guaiFENesin (ROBITUSSIN) 100 MG/5ML SOLN Take 10 mLs by mouth every 4 (four) hours as needed for cough or to loosen phlegm.    Marland Kitchen levothyroxine (SYNTHROID, LEVOTHROID)  112 MCG tablet Take 1 tablet (112 mcg total) by mouth daily before breakfast. 30 tablet 3  . loratadine (CLARITIN) 10 MG tablet Take 5 mg by mouth 2 (two) times daily as needed for allergies or rhinitis.     . Magnesium 400 MG CAPS Take 400 mg by mouth 2 (two) times daily.    . metoprolol tartrate (LOPRESSOR) 25 MG tablet Take 25 mg by mouth 2 (two) times daily.    . Misc Natural Products (SAW PALMETTO) CAPS Take 1 capsule by mouth daily.    Marland Kitchen OVER THE COUNTER MEDICATION Take 1 tablet by mouth daily. muscudine seed    . potassium chloride SA (K-DUR,KLOR-CON) 20 MEQ tablet Take by mouth 2 (two) times daily. Take 2 tablets by mouth (70meq) in the AM and 1 tablet by mouth (78meq) in the PM    . predniSONE (DELTASONE) 10 MG tablet Take 10 mg by mouth daily as needed (gout flare up).     . vitamin C (ASCORBIC ACID) 500 MG tablet Take 500 mg by mouth daily.      No current facility-administered medications for this visit.    Allergies  Allergen Reactions  . Penicillins Shortness Of Breath and Rash    Ended up at ER after using  . Oxycodone Other (See Comments)    Altered mental status    Review of Systems negative except from HPI and PMH  Physical Exam BP 102/70 mmHg  Pulse 63  Ht 5\' 9"  (1.753 m)  Wt 214 lb 3.2 oz (97.16 kg)  BMI 31.62 kg/m2  SpO2 98% Well developed and well nourished in no acute distress HENT normal E scleral and icterus clear Neck Supple  carotids brisk and full Clear to ausculation Regular rate and rhythm, no murmurs gallops or rub Soft with active bowel sounds No clubbing cyanosis no Edema Alert and oriented, grossly normal motor and sensory function Skin Warm and Dry  ECG was ordered today demonstrating   A atrial paced rhythm at 63   24/09/4 we have always Monurol 0 with a QTC of 42   Assessment and  Plan\ Sinus node dysfunction  Atrial fibrillation-paroxysmal  HFpEF  Obstructive sleep apnea-treated  Pacemaker-Medtronic The patient's device  was interrogated.  The information was reviewed. No changes were made in the programming.     He is doing much better in sinus rhythm. He is euvolemic. He continues to work on weight loss.   He is tolerating his dofetilide;    Labs from Dr. Inda Merlin potassium 3.8 on 629. We will increase his potassium from 40/20--40/40. We will check a metabolic profile in 2 weeks.

## 2016-04-09 NOTE — Patient Instructions (Addendum)
Medication Instructions: - Your physician has recommended you make the following change in your medication:   1)  INCREASE Potassium to 40 mEq twice a day  Labwork: - Your physician recommends that you return for lab work in: 2 weeks for a BMET   Procedures/Testing: - None ordered  Follow-Up: - Remote monitoring is used to monitor your Pacemaker of ICD from home. This monitoring reduces the number of office visits required to check your device to one time per year. It allows [place] to keep an eye on the functioning of your device to ensure it is working properly. You are scheduled for a device check from home on 06/29/16. You may send your transmission at any time that day. If you have a wireless device, the transmission will be sent automatically. After your physician reviews your transmission, you will receive a postcard with your next transmission date.  - Your physician wants you to follow-up in: 6 months with Roderic Palau, NP in the AFib clinic.  You will receive a reminder letter in the mail two months in advance. If you don't receive a letter, please call our office to schedule the follow-up appointment.  - Your physician wants you to follow-up in: 1 year with Dr. Caryl Comes. You will receive a reminder letter in the mail two months in advance. If you don't receive a letter, please call our office to schedule the follow-up appointment.  If you need a refill on your cardiac medications before your next appointment, please call your pharmacy.   Thank you for choosing [facility]!!

## 2016-04-14 DIAGNOSIS — Z5181 Encounter for therapeutic drug level monitoring: Secondary | ICD-10-CM | POA: Diagnosis not present

## 2016-04-23 ENCOUNTER — Other Ambulatory Visit: Payer: Medicare Other | Admitting: *Deleted

## 2016-04-23 DIAGNOSIS — Z79899 Other long term (current) drug therapy: Secondary | ICD-10-CM | POA: Diagnosis not present

## 2016-04-23 LAB — BASIC METABOLIC PANEL
BUN: 23 mg/dL (ref 7–25)
CALCIUM: 8.8 mg/dL (ref 8.6–10.3)
CO2: 28 mmol/L (ref 20–31)
Chloride: 105 mmol/L (ref 98–110)
Creat: 1.21 mg/dL — ABNORMAL HIGH (ref 0.70–1.18)
GLUCOSE: 87 mg/dL (ref 65–99)
POTASSIUM: 3.8 mmol/L (ref 3.5–5.3)
SODIUM: 141 mmol/L (ref 135–146)

## 2016-05-01 DIAGNOSIS — M9905 Segmental and somatic dysfunction of pelvic region: Secondary | ICD-10-CM | POA: Diagnosis not present

## 2016-05-01 DIAGNOSIS — M9901 Segmental and somatic dysfunction of cervical region: Secondary | ICD-10-CM | POA: Diagnosis not present

## 2016-05-01 DIAGNOSIS — M9903 Segmental and somatic dysfunction of lumbar region: Secondary | ICD-10-CM | POA: Diagnosis not present

## 2016-05-01 DIAGNOSIS — M9904 Segmental and somatic dysfunction of sacral region: Secondary | ICD-10-CM | POA: Diagnosis not present

## 2016-05-05 DIAGNOSIS — Z96651 Presence of right artificial knee joint: Secondary | ICD-10-CM | POA: Diagnosis not present

## 2016-05-05 DIAGNOSIS — Z471 Aftercare following joint replacement surgery: Secondary | ICD-10-CM | POA: Diagnosis not present

## 2016-05-11 DIAGNOSIS — R262 Difficulty in walking, not elsewhere classified: Secondary | ICD-10-CM | POA: Diagnosis not present

## 2016-05-11 DIAGNOSIS — M25661 Stiffness of right knee, not elsewhere classified: Secondary | ICD-10-CM | POA: Diagnosis not present

## 2016-05-13 DIAGNOSIS — R262 Difficulty in walking, not elsewhere classified: Secondary | ICD-10-CM | POA: Diagnosis not present

## 2016-05-13 DIAGNOSIS — M25661 Stiffness of right knee, not elsewhere classified: Secondary | ICD-10-CM | POA: Diagnosis not present

## 2016-05-15 DIAGNOSIS — R262 Difficulty in walking, not elsewhere classified: Secondary | ICD-10-CM | POA: Diagnosis not present

## 2016-05-15 DIAGNOSIS — M25661 Stiffness of right knee, not elsewhere classified: Secondary | ICD-10-CM | POA: Diagnosis not present

## 2016-05-19 DIAGNOSIS — M25661 Stiffness of right knee, not elsewhere classified: Secondary | ICD-10-CM | POA: Diagnosis not present

## 2016-05-19 DIAGNOSIS — R262 Difficulty in walking, not elsewhere classified: Secondary | ICD-10-CM | POA: Diagnosis not present

## 2016-05-20 DIAGNOSIS — R262 Difficulty in walking, not elsewhere classified: Secondary | ICD-10-CM | POA: Diagnosis not present

## 2016-05-20 DIAGNOSIS — M25661 Stiffness of right knee, not elsewhere classified: Secondary | ICD-10-CM | POA: Diagnosis not present

## 2016-05-22 DIAGNOSIS — R262 Difficulty in walking, not elsewhere classified: Secondary | ICD-10-CM | POA: Diagnosis not present

## 2016-05-22 DIAGNOSIS — M25661 Stiffness of right knee, not elsewhere classified: Secondary | ICD-10-CM | POA: Diagnosis not present

## 2016-05-25 DIAGNOSIS — M25661 Stiffness of right knee, not elsewhere classified: Secondary | ICD-10-CM | POA: Diagnosis not present

## 2016-05-25 DIAGNOSIS — R262 Difficulty in walking, not elsewhere classified: Secondary | ICD-10-CM | POA: Diagnosis not present

## 2016-05-27 DIAGNOSIS — M25661 Stiffness of right knee, not elsewhere classified: Secondary | ICD-10-CM | POA: Diagnosis not present

## 2016-05-27 DIAGNOSIS — R262 Difficulty in walking, not elsewhere classified: Secondary | ICD-10-CM | POA: Diagnosis not present

## 2016-05-29 DIAGNOSIS — M25661 Stiffness of right knee, not elsewhere classified: Secondary | ICD-10-CM | POA: Diagnosis not present

## 2016-05-29 DIAGNOSIS — R262 Difficulty in walking, not elsewhere classified: Secondary | ICD-10-CM | POA: Diagnosis not present

## 2016-06-02 ENCOUNTER — Other Ambulatory Visit (HOSPITAL_COMMUNITY): Payer: Self-pay | Admitting: *Deleted

## 2016-06-02 MED ORDER — DOFETILIDE 250 MCG PO CAPS: 250.0000 ug | ORAL_CAPSULE | Freq: Two times a day (BID) | ORAL | 6 refills | Status: DC

## 2016-06-03 DIAGNOSIS — R262 Difficulty in walking, not elsewhere classified: Secondary | ICD-10-CM | POA: Diagnosis not present

## 2016-06-03 DIAGNOSIS — M25661 Stiffness of right knee, not elsewhere classified: Secondary | ICD-10-CM | POA: Diagnosis not present

## 2016-06-04 DIAGNOSIS — Z96651 Presence of right artificial knee joint: Secondary | ICD-10-CM | POA: Diagnosis not present

## 2016-06-04 DIAGNOSIS — Z471 Aftercare following joint replacement surgery: Secondary | ICD-10-CM | POA: Diagnosis not present

## 2016-06-10 ENCOUNTER — Encounter: Payer: Self-pay | Admitting: Interventional Cardiology

## 2016-06-12 ENCOUNTER — Telehealth (HOSPITAL_COMMUNITY): Payer: Self-pay | Admitting: *Deleted

## 2016-06-12 NOTE — Telephone Encounter (Signed)
Pt called in stating his weight is up and he is more short of breath over the last several days. His normal weight is 213lbs but its been up to 217lbs since Saturday. He did take an extra lasix yesterday and his weight went to 215lbs but he is still up several pounds. He uses CPAP at night so his sleep has not been effected but he is more fatigued with activity and short of breath with any activity.  Discussed with Roderic Palau NP recommended he increase his lasix to 40mg  bid for next 2 days and then come in for reck of bmet/symptoms early next week. Pt agreeable to plan.

## 2016-06-15 ENCOUNTER — Ambulatory Visit (HOSPITAL_COMMUNITY)
Admission: RE | Admit: 2016-06-15 | Discharge: 2016-06-15 | Disposition: A | Discharge status: Home / Self Care | Payer: Medicare Other | Source: Ambulatory Visit | Attending: Nurse Practitioner | Admitting: Nurse Practitioner

## 2016-06-15 ENCOUNTER — Encounter (HOSPITAL_COMMUNITY): Payer: Self-pay | Admitting: Nurse Practitioner

## 2016-06-15 VITALS — BP 116/64 | HR 83 | Ht 69.0 in | Wt 219.2 lb

## 2016-06-15 DIAGNOSIS — I712 Thoracic aortic aneurysm, without rupture: Secondary | ICD-10-CM | POA: Diagnosis not present

## 2016-06-15 DIAGNOSIS — Z880 Allergy status to penicillin: Secondary | ICD-10-CM | POA: Diagnosis not present

## 2016-06-15 DIAGNOSIS — Z9889 Other specified postprocedural states: Secondary | ICD-10-CM | POA: Diagnosis not present

## 2016-06-15 DIAGNOSIS — G4730 Sleep apnea, unspecified: Secondary | ICD-10-CM | POA: Diagnosis not present

## 2016-06-15 DIAGNOSIS — Z79899 Other long term (current) drug therapy: Secondary | ICD-10-CM | POA: Insufficient documentation

## 2016-06-15 DIAGNOSIS — I495 Sick sinus syndrome: Secondary | ICD-10-CM | POA: Diagnosis not present

## 2016-06-15 DIAGNOSIS — J309 Allergic rhinitis, unspecified: Secondary | ICD-10-CM | POA: Insufficient documentation

## 2016-06-15 DIAGNOSIS — I509 Heart failure, unspecified: Secondary | ICD-10-CM | POA: Insufficient documentation

## 2016-06-15 DIAGNOSIS — M109 Gout, unspecified: Secondary | ICD-10-CM | POA: Diagnosis not present

## 2016-06-15 DIAGNOSIS — N400 Enlarged prostate without lower urinary tract symptoms: Secondary | ICD-10-CM | POA: Insufficient documentation

## 2016-06-15 DIAGNOSIS — Z96659 Presence of unspecified artificial knee joint: Secondary | ICD-10-CM | POA: Diagnosis not present

## 2016-06-15 DIAGNOSIS — Z87891 Personal history of nicotine dependence: Secondary | ICD-10-CM | POA: Insufficient documentation

## 2016-06-15 DIAGNOSIS — I481 Persistent atrial fibrillation: Secondary | ICD-10-CM | POA: Insufficient documentation

## 2016-06-15 DIAGNOSIS — E039 Hypothyroidism, unspecified: Secondary | ICD-10-CM | POA: Insufficient documentation

## 2016-06-15 DIAGNOSIS — I480 Paroxysmal atrial fibrillation: Secondary | ICD-10-CM | POA: Diagnosis not present

## 2016-06-15 DIAGNOSIS — Z7901 Long term (current) use of anticoagulants: Secondary | ICD-10-CM | POA: Diagnosis not present

## 2016-06-15 DIAGNOSIS — I110 Hypertensive heart disease with heart failure: Secondary | ICD-10-CM | POA: Insufficient documentation

## 2016-06-15 LAB — CBC
HEMATOCRIT: 38.4 % — AB (ref 39.0–52.0)
Hemoglobin: 12.3 g/dL — ABNORMAL LOW (ref 13.0–17.0)
MCH: 30.4 pg (ref 26.0–34.0)
MCHC: 32 g/dL (ref 30.0–36.0)
MCV: 94.8 fL (ref 78.0–100.0)
Platelets: 120 10*3/uL — ABNORMAL LOW (ref 150–400)
RBC: 4.05 MIL/uL — ABNORMAL LOW (ref 4.22–5.81)
RDW: 14.8 % (ref 11.5–15.5)
WBC: 4.4 10*3/uL (ref 4.0–10.5)

## 2016-06-15 LAB — BASIC METABOLIC PANEL
Anion gap: 4 — ABNORMAL LOW (ref 5–15)
BUN: 21 mg/dL — AB (ref 6–20)
CALCIUM: 9.4 mg/dL (ref 8.9–10.3)
CO2: 33 mmol/L — AB (ref 22–32)
CREATININE: 1.25 mg/dL — AB (ref 0.61–1.24)
Chloride: 103 mmol/L (ref 101–111)
GFR calc non Af Amer: 54 mL/min — ABNORMAL LOW (ref >60)
Glucose, Bld: 92 mg/dL (ref 65–99)
Potassium: 4.2 mmol/L (ref 3.5–5.1)
SODIUM: 140 mmol/L (ref 135–145)

## 2016-06-15 LAB — MAGNESIUM: Magnesium: 1.9 mg/dL (ref 1.7–2.4)

## 2016-06-15 NOTE — Progress Notes (Signed)
Patient ID: Don Jacobs, male   DOB: 08/30/1936, 80 y.o.   MRN: [id number]     Primary Care Physician: Don Screws, MD Referring Physician:Dr. Rayann Jacobs   Don Jacobs is a 80 y.o. male with a h/o PPM, CHF, persistent afib maintaining SR on tikosyn. Very little breakthrough afib. He denies any episodes of afib. Weight is stable. Avoiding salt and limiting fluids.   He returns to clinic 5 weeks s/p rt knee replacement. He did not have any issues with afib. He feels mildly dizzy and short of breath but weight is stable. He took himself off lasix since his weight dropped after surgery due to poor appetite. He has finished PT and hopes to get back to gym soon.   Returns to afib clinic 9/18. He asked to be seen today for increase of weight over the last few days, associated with mild shortness of breath and 2-3 hours of afib Sunday am. His weight is back down to his usual am weight, after increasing lasix to 40 mg bid for a few days.He had gotten down to 206 lbs after knee surgery but his most recent  am weight's are  running around 214.He had increased to 217 lbs recently. EKG shows paced atrial rhythm with LBBB. He has had a LBBB on EKG's in the past. When in LBBB, QTc is slightly prolonged. When in SR, qtc is WNL.  Today, he denies symptoms of palpitations, chest pain,  orthopnea, PND,  dizziness, presyncope, syncope, or neurologic sequela. MIld dyspnea, mild dizziness. Mild increase in SOB, LLE, weight last week. The patient is tolerating medications without difficulties and is otherwise without complaint today.   Past Medical History:  Diagnosis Date  . Allergic rhinitis   . Aortic aneurysm, thoracic ([place]) 05/25/2011  . BPH (benign prostatic hyperplasia)   . Chronic systolic CHF (congestive heart failure) ([place])   . Dizziness and giddiness 06/04/2015  . ED (erectile dysfunction)   . Gout   . Hearing loss    uses hearing a cyst  . Hx of migraines   . Hypertension   . Hypothyroidism    . Persistent atrial fibrillation ([place])   . Prostatitis, chronic   . Seborrheic keratosis   . Sick sinus syndrome (H[facility])    a. s/p PPM   . Sleep apnea   . Spondylolisthesis 07/2010   Past Surgical History:  Procedure Laterality Date  . ANTERIOR CERVICAL DECOMP/DISCECTOMY FUSION  10/2000   "C4, 5, 6 w/fusion" (02/17/2013)  . BACK SURGERY    . CARDIAC CATHETERIZATION  2000's   "once" (02/17/2013)  . CARDIOVERSION N/A 04/09/2014   Procedure: CARDIOVERSION;  Surgeon: Lelon Perla, MD;  Location: [facility] ENDOSCOPY;  Service: Cardiovascular;  Laterality: N/A;  . CARDIOVERSION N/A 01/17/2015   Procedure: CARDIOVERSION;  Surgeon: Larey Dresser, MD;  Location: [facility] ENDOSCOPY;  Service: Cardiovascular;  Laterality: N/A;  . FOOT SURGERY Right ~ 2007   "reconstruction" (02/17/2013)  . INSERT / REPLACE / REMOVE PACEMAKER  02/17/2013  . KNEE ARTHROSCOPY Left ~ 2007  . L5 selective nerve root block     Dr Nelva Bush  . LUMBAR SPINE SURGERY  2012   "bone graft, Dr. [name]" (02/17/2013)  . PERMANENT PACEMAKER INSERTION N/A 02/17/2013   Procedure: PERMANENT PACEMAKER INSERTION;  Surgeon: Deboraha Sprang, MD;  Location: [facility] CATH LAB;  Service: Cardiovascular;  Laterality: N/A;  . REPAIR PERONEAL TENDONS ANKLE Right 10/2003  . SHOULDER OPEN ROTATOR CUFF REPAIR Right 09/2007; 2013    Current Outpatient  Prescriptions  Medication Sig Dispense Refill  . allopurinol (ZYLOPRIM) 300 MG tablet Take 300 mg by mouth at bedtime.     Marland Kitchen apixaban (ELIQUIS) 5 MG TABS tablet Take 1 tablet (5 mg total) by mouth 2 (two) times daily. 180 tablet 3  . Calcium Carbonate (CALTRATE 600 PO) Take 600 mg by mouth at bedtime.     . cholecalciferol (VITAMIN D) 1000 UNITS tablet Take 1,000 Units by mouth at bedtime.    . Cinnamon 500 MG capsule Take 500 mg by mouth daily.    . cyanocobalamin 500 MCG tablet Take 500 mcg by mouth daily.    Marland Kitchen dofetilide (TIKOSYN) 250 MCG capsule Take 1 capsule (250 mcg total) by mouth 2 (two) times daily. 60  capsule 6  . doxazosin (CARDURA) 4 MG tablet Take 1 tablet (4 mg total) by mouth at bedtime. 30 tablet 11  . furosemide (LASIX) 40 MG tablet Take 1 tablet (40 mg total) by mouth daily. 90 tablet 3  . levothyroxine (SYNTHROID, LEVOTHROID) 112 MCG tablet Take 1 tablet (112 mcg total) by mouth daily before breakfast. 30 tablet 3  . Magnesium 400 MG CAPS Take 400 mg by mouth 2 (two) times daily.    . metoprolol tartrate (LOPRESSOR) 25 MG tablet Take 25 mg by mouth 2 (two) times daily.    . Misc Natural Products (SAW PALMETTO) CAPS Take 1 capsule by mouth daily.    Marland Kitchen OVER THE COUNTER MEDICATION Take 1 tablet by mouth daily. muscudine seed    . potassium chloride SA (K-DUR,KLOR-CON) 20 MEQ tablet Take 2 tablets (40 mEq total) by mouth daily. (Patient taking differently: Take 80 mEq by mouth daily. ) 60 tablet 6  . vitamin C (ASCORBIC ACID) 500 MG tablet Take 500 mg by mouth daily.     . benzonatate (TESSALON) 100 MG capsule Take 1 capsule (100 mg total) by mouth 3 (three) times daily as needed for cough. (Patient not taking: Reported on 06/15/2016) 90 capsule 1  . colchicine 0.6 MG tablet Take 0.6 mg by mouth daily as needed (gout).     Marland Kitchen guaiFENesin (ROBITUSSIN) 100 MG/5ML SOLN Take 10 mLs by mouth every 4 (four) hours as needed for cough or to loosen phlegm.    . loratadine (CLARITIN) 10 MG tablet Take 5 mg by mouth 2 (two) times daily as needed for allergies or rhinitis.      No current facility-administered medications for this encounter.     Allergies  Allergen Reactions  . Penicillins Shortness Of Breath and Rash    Ended up at ER after using  . Oxycodone Other (See Comments)    Altered mental status    Social History   Social History  . Marital status: Married    Spouse name: N/A  . Number of children: 2  . Years of education: 14   Occupational History  . retired Retired   Social History Main Topics  . Smoking status: Former Smoker    Packs/day: 1.00    Years: 12.00    Types:  Cigarettes    Quit date: 03/11/1981  . Smokeless tobacco: Never Used  . Alcohol use No  . Drug use: No  . Sexual activity: Not on file   Other Topics Concern  . Not on file   Social History Narrative   Patient does not drink caffeine.   Patient is right handed.       Family History  Problem Relation Age of Onset  . Heart disease Father   .  Heart attack Father   . Emphysema Mother   . Alcohol abuse Maternal Grandmother   . Alcohol abuse Maternal Grandfather   . Heart attack Paternal Grandfather   . Heart disease Paternal Grandfather   . Rheum arthritis Brother     ROS- All systems are reviewed and negative except as per the HPI above  Physical Exam: Vitals:   06/15/16 0904  BP: 116/64  Pulse: 83  Weight: 219 lb 3.2 oz (99.4 kg)  Height: 5\' 9"  (1.753 m)    GEN- The patient is well appearing, alert and oriented x 3 today.   Head- normocephalic, atraumatic Eyes-  Sclera clear, conjunctiva pink Ears- hearing intact Oropharynx- clear Neck- supple, no JVP Lymph- no cervical lymphadenopathy Lungs- Clear to ausculation bilaterally, normal work of breathing Heart- Regular rate and rhythm, no murmurs, rubs or gallops, PMI not laterally displaced GI- soft, NT, ND, + BS Extremities- no clubbing, cyanosis, or edema MS- no significant deformity or atrophy Skin- no rash or lesion Psych- euthymic mood, full affect Neuro- strength and sensation are intact  EKG-Atrial paced rhythm with prolonged AV conduction, LAD, LBBB,  v rate at 83 bpm, pr int 298 ms, qrs int 150 ms, qtc 509 ms    Assessment and Plan: 1. afib Had been in SR on tikosyn , rare breakthrough this past Sunday am, reminded that he can take an extra 1/2 tab of BB with afib episodes Per Dr. Jackalyn Lombard last note in June of this year, he would have a low threshold for ablation if afib episodes should increase Continue xarelto Bmet/mag/cbc  2. CHF, mild dyspnea, LEE recently Clinically stable Continue  BB/ACE/diuretic Improved with doubling lasix x 3 days Avoid salt/limit fluids Back on lasix 40 mg qd Repeat echo  3. HTN Stable  4. PPM F/u with Dr. Caryl Comes as scheduled Saw pt in July and was doing well   F/u with Dr. Irish Lack as scheduled 9/27 afib clinic as needed  Butch Penny C. Kyheem [place], [facility] [address] [place], [place] [address] [phone]

## 2016-06-16 ENCOUNTER — Ambulatory Visit (HOSPITAL_COMMUNITY)
Admission: RE | Admit: 2016-06-16 | Discharge: 2016-06-16 | Disposition: A | Discharge status: Home / Self Care | Payer: Medicare Other | Source: Ambulatory Visit | Attending: Nurse Practitioner | Admitting: Nurse Practitioner

## 2016-06-16 ENCOUNTER — Ambulatory Visit (HOSPITAL_COMMUNITY): Payer: Medicare Other | Admitting: Nurse Practitioner

## 2016-06-16 DIAGNOSIS — I517 Cardiomegaly: Secondary | ICD-10-CM | POA: Insufficient documentation

## 2016-06-16 DIAGNOSIS — R0602 Shortness of breath: Secondary | ICD-10-CM

## 2016-06-16 DIAGNOSIS — Z950 Presence of cardiac pacemaker: Secondary | ICD-10-CM | POA: Insufficient documentation

## 2016-06-16 NOTE — Progress Notes (Signed)
Patient called in stating he woke up more short of breath today. Butch Penny requested pt come in for chest xray. Will follow up pending results. No charge for visit

## 2016-06-18 ENCOUNTER — Ambulatory Visit (HOSPITAL_COMMUNITY)
Admission: RE | Admit: 2016-06-18 | Discharge: 2016-06-18 | Disposition: A | Discharge status: Home / Self Care | Payer: Medicare Other | Source: Ambulatory Visit | Attending: Nurse Practitioner | Admitting: Nurse Practitioner

## 2016-06-18 ENCOUNTER — Other Ambulatory Visit: Payer: Self-pay | Admitting: Surgery

## 2016-06-18 DIAGNOSIS — I480 Paroxysmal atrial fibrillation: Secondary | ICD-10-CM | POA: Diagnosis not present

## 2016-06-18 DIAGNOSIS — I712 Thoracic aortic aneurysm, without rupture, unspecified: Secondary | ICD-10-CM

## 2016-06-18 DIAGNOSIS — I517 Cardiomegaly: Secondary | ICD-10-CM | POA: Insufficient documentation

## 2016-06-18 NOTE — Progress Notes (Signed)
  Echocardiogram 2D Echocardiogram has been performed.  Diamond Nickel 06/18/2016, 11:56 AM

## 2016-06-19 ENCOUNTER — Other Ambulatory Visit (HOSPITAL_COMMUNITY): Payer: Self-pay | Admitting: *Deleted

## 2016-06-19 MED ORDER — LISINOPRIL 5 MG PO TABS: 2.5000 mg | ORAL_TABLET | Freq: Every day | ORAL | 3 refills | Status: DC

## 2016-06-24 ENCOUNTER — Encounter: Payer: Self-pay | Admitting: Interventional Cardiology

## 2016-06-24 ENCOUNTER — Ambulatory Visit (INDEPENDENT_AMBULATORY_CARE_PROVIDER_SITE_OTHER): Payer: Medicare Other | Admitting: Interventional Cardiology

## 2016-06-24 VITALS — BP 100/60 | HR 61 | Ht 69.0 in | Wt 220.0 lb

## 2016-06-24 DIAGNOSIS — I482 Chronic atrial fibrillation, unspecified: Secondary | ICD-10-CM

## 2016-06-24 DIAGNOSIS — R0602 Shortness of breath: Secondary | ICD-10-CM

## 2016-06-24 DIAGNOSIS — R931 Abnormal findings on diagnostic imaging of heart and coronary circulation: Secondary | ICD-10-CM

## 2016-06-24 DIAGNOSIS — I5022 Chronic systolic (congestive) heart failure: Secondary | ICD-10-CM

## 2016-06-24 LAB — BASIC METABOLIC PANEL
BUN: 25 mg/dL (ref 7–25)
CALCIUM: 9.1 mg/dL (ref 8.6–10.3)
CO2: 28 mmol/L (ref 20–31)
Chloride: 98 mmol/L (ref 98–110)
Creat: 1.4 mg/dL — ABNORMAL HIGH (ref 0.70–1.18)
GLUCOSE: 88 mg/dL (ref 65–99)
POTASSIUM: 3.9 mmol/L (ref 3.5–5.3)
SODIUM: 139 mmol/L (ref 135–146)

## 2016-06-24 NOTE — Progress Notes (Signed)
Cardiology Office Note   Date:  06/24/2016   ID:  Don Jacobs, DOB 03/21/1936, MRN [id number]  PCP:  Henrine Screws, MD    No chief complaint on file. Fatigue, SHOB   Wt Readings from Last 3 Encounters:  06/24/16 220 lb (99.8 kg)  06/15/16 219 lb 3.2 oz (99.4 kg)  04/09/16 214 lb 3.2 oz (97.2 kg)       History of Present Illness: Don Jacobs is a 80 y.o. male  who has had symptomatic atrial fibrillation and tachybradycardia syndrome. He has undergone pacemaker placement. His history was complicated by a fall and head trauma including a subdural hematoma a few years ago. He has been cleared for anticoagulation due to the atrial fibrillation.  He had been in atrial fibrillation, but due to his symptoms he was referred to electrophysiology. Dofetilide has been very helpful in maintaining sinus rhythm. He feels much better. He denies any palpitations. He has not had chest pain. He has some issues with joint pains and decreased balance. Other than that, he feels well.  He had a knee replacement at [facility] in 2017. Fatigue has limited his rehab.  Echo was done recently showing significantly decreased LV function.  He had a clean cath in the past in 2010.    He has had some weight gain and SHOB with exertion.  He feels fatigued.      Past Medical History:  Diagnosis Date  . Allergic rhinitis   . Aortic aneurysm, thoracic ([place]) 05/25/2011  . BPH (benign prostatic hyperplasia)   . Chronic systolic CHF (congestive heart failure) ([place])   . Dizziness and giddiness 06/04/2015  . ED (erectile dysfunction)   . Gout   . Hearing loss    uses hearing a cyst  . Hx of migraines   . Hypertension   . Hypothyroidism   . Persistent atrial fibrillation ([place])   . Prostatitis, chronic   . Seborrheic keratosis   . Sick sinus syndrome ([facility])    a. s/p PPM   . Sleep apnea   . Spondylolisthesis 07/2010    Past Surgical History:  Procedure Laterality Date  . ANTERIOR CERVICAL  DECOMP/DISCECTOMY FUSION  10/2000   "C4, 5, 6 w/fusion" (02/17/2013)  . BACK SURGERY    . CARDIAC CATHETERIZATION  2000's   "once" (02/17/2013)  . CARDIOVERSION N/A 04/09/2014   Procedure: CARDIOVERSION;  Surgeon: Lelon Perla, MD;  Location: [facility] ENDOSCOPY;  Service: Cardiovascular;  Laterality: N/A;  . CARDIOVERSION N/A 01/17/2015   Procedure: CARDIOVERSION;  Surgeon: Larey Dresser, MD;  Location: [facility] ENDOSCOPY;  Service: Cardiovascular;  Laterality: N/A;  . FOOT SURGERY Right ~ 2007   "reconstruction" (02/17/2013)  . INSERT / REPLACE / REMOVE PACEMAKER  02/17/2013  . KNEE ARTHROSCOPY Left ~ 2007  . L5 selective nerve root block     Dr Nelva Bush  . LUMBAR SPINE SURGERY  2012   "bone graft, Dr. [name]" (02/17/2013)  . PERMANENT PACEMAKER INSERTION N/A 02/17/2013   Procedure: PERMANENT PACEMAKER INSERTION;  Surgeon: Deboraha Sprang, MD;  Location: [facility] CATH LAB;  Service: Cardiovascular;  Laterality: N/A;  . REPAIR PERONEAL TENDONS ANKLE Right 10/2003  . SHOULDER OPEN ROTATOR CUFF REPAIR Right 09/2007; 2013     Current Outpatient Prescriptions  Medication Sig Dispense Refill  . allopurinol (ZYLOPRIM) 300 MG tablet Take 300 mg by mouth at bedtime.     Marland Kitchen apixaban (ELIQUIS) 5 MG TABS tablet Take 1 tablet (5 mg total) by mouth 2 (two)  times daily. 180 tablet 3  . benzonatate (TESSALON) 100 MG capsule Take 1 capsule (100 mg total) by mouth 3 (three) times daily as needed for cough. 90 capsule 1  . Calcium Carbonate (CALTRATE 600 PO) Take 600 mg by mouth at bedtime.     . Cholecalciferol (D 2000) 2000 units TABS Take 1 tablet by mouth daily.     . cholecalciferol (VITAMIN D) 1000 UNITS tablet Take 1,000 Units by mouth at bedtime.    . Cinnamon 500 MG capsule Take 500 mg by mouth daily.    Wallace Cullens POWD Take 1 capsule by mouth daily.     . colchicine 0.6 MG tablet Take 0.6 mg by mouth daily as needed (gout).     . cyanocobalamin 500 MCG tablet Take 500 mcg by mouth daily.    Marland Kitchen dofetilide  (TIKOSYN) 250 MCG capsule Take 1 capsule (250 mcg total) by mouth 2 (two) times daily. 60 capsule 6  . doxazosin (CARDURA) 4 MG tablet Take 1 tablet (4 mg total) by mouth at bedtime. 30 tablet 11  . furosemide (LASIX) 40 MG tablet Take 1 tablet (40 mg total) by mouth daily. 90 tablet 3  . guaiFENesin (ROBITUSSIN) 100 MG/5ML SOLN Take 10 mLs by mouth every 4 (four) hours as needed for cough or to loosen phlegm.    Marland Kitchen levothyroxine (SYNTHROID, LEVOTHROID) 112 MCG tablet Take 1 tablet (112 mcg total) by mouth daily before breakfast. 30 tablet 3  . lisinopril (PRINIVIL,ZESTRIL) 5 MG tablet Take 0.5 tablets (2.5 mg total) by mouth daily. 30 tablet 3  . loratadine (CLARITIN) 10 MG tablet Take 5 mg by mouth 2 (two) times daily as needed for allergies or rhinitis.     . Magnesium 400 MG CAPS Take 400 mg by mouth 2 (two) times daily.    . metoprolol tartrate (LOPRESSOR) 25 MG tablet Take 25 mg by mouth 2 (two) times daily.    . Misc Natural Products (SAW PALMETTO) CAPS Take 1 capsule by mouth daily.    Marland Kitchen OVER THE COUNTER MEDICATION Take 1 tablet by mouth daily. muscudine seed    . potassium chloride SA (K-DUR,KLOR-CON) 20 MEQ tablet Take 80 mEq by mouth daily. TAKE 2 TABS IN THE A.M. AND TAKE 2 TABS IN THE PM TOTAL OF 80 MG    . predniSONE (DELTASONE) 10 MG tablet Take 10 mg by mouth as needed (GOUT).    . vitamin C (ASCORBIC ACID) 500 MG tablet Take 500 mg by mouth daily.      No current facility-administered medications for this visit.     Allergies:   Penicillins and Oxycodone    Social History:  The patient  reports that he quit smoking about 35 years ago. His smoking use included Cigarettes. He has a 12.00 pack-year smoking history. He has never used smokeless tobacco. He reports that he does not drink alcohol or use drugs.   Family History:  The patient's family history includes Alcohol abuse in his maternal grandfather and maternal grandmother; Emphysema in his mother; Heart attack in his father  and paternal grandfather; Heart disease in his father and paternal grandfather; Rheum arthritis in his brother.    ROS:  Please see the history of present illness.   Otherwise, review of systems are positive for Fatigue.   All other systems are reviewed and negative.    PHYSICAL EXAM: VS:  BP 100/60   Pulse 61   Ht '5\' 9"'$  (1.753 m)   Wt 220 lb (  99.8 kg)   BMI 32.49 kg/m  , BMI Body mass index is 32.49 kg/m. GEN: Well nourished, well developed, in no acute distress  HEENT: normal  Neck: no JVD, carotid bruits, or masses Cardiac: RRR; no murmurs, rubs, or gallops, trace bilateral lower extremity edema R>L Respiratory:  clear to auscultation bilaterally, normal work of breathing GI: soft, nontender, nondistended, + BS MS: no deformity or atrophy  Skin: warm and dry, no rash Neuro:  Strength and sensation are intact Psych: euthymic mood, full affect   EKG:   The ekg ordered today demonstrates atrial paced, LBBB   Recent Labs: 12/09/2015: TSH 2.238 01/29/2016: ALT 12 06/15/2016: BUN 21; Creatinine, Ser 1.25; Hemoglobin 12.3; Magnesium 1.9; Platelets 120; Potassium 4.2; Sodium 140   Lipid Panel No results found for: CHOL, TRIG, HDL, CHOLHDL, VLDL, LDLCALC, LDLDIRECT   Other studies Reviewed: Additional studies/ records that were reviewed today with results demonstrating: I personally reviewed the echocardiogram pictures. In several views, ejection fraction appears better than the 20-25% that was reported. He does have significant septal wall motion abnormality consistent with left bundle branch block..   ASSESSMENT AND PLAN:  1. Abnormal echocardiogram: Decreased ejection fraction seems mostly from abnormal septal motion. In some views, the ejection fraction appears significantly better than 20-25%. Known left bundle branch block on ECG. I suspect this may be contributing to some of his heart failure symptoms. He potentially may improve with biventricular pacing. We'll send him back  to Dr. Caryl Comes to further evaluate. 2. Shortness of breath/fatigue: Check BNP and be met today. Continue twice-daily Lasix for now. Depending on renal function, we may need to go back to once a day. 3. Atrial fibrillation: Eliquis for stroke prevention. He has been followed in the atrial fibrillation clinic and they did not feel that any suspected decrease in ejection fraction was from a high burden of atrial fibrillation. His burden of A. fib has actually been fairly low.   Current medicines are reviewed at length with the patient today.  The patient concerns regarding his medicines were addressed.  The following changes have been made:  No change  Labs/ tests ordered today include:   Orders Placed This Encounter  Procedures  . Basic Metabolic Panel (BMET)  . B Nat Peptide  . EKG 12-Lead    Recommend 150 minutes/week of aerobic exercise Low fat, low carb, high fiber diet recommended  Disposition:   FU with Dr. Caryl Comes   Signed, Larae Grooms, MD  06/24/2016 10:52 AM    [address], [place], [place]  [address] Phone: [phone]; Fax: [phone]

## 2016-06-24 NOTE — Patient Instructions (Addendum)
Medication Instructions:  Same-no changes  Labwork: Today-BMET and BNP  Testing/Procedures: None  Follow-Up: With EP (Dr Caryl Comes) in 2 weeks for possible upgrade to Bi V .     If you need a refill on your cardiac medications before your next appointment, please call your pharmacy.

## 2016-06-25 LAB — BRAIN NATRIURETIC PEPTIDE: Brain Natriuretic Peptide: 30.9 pg/mL (ref <100)

## 2016-06-26 ENCOUNTER — Telehealth: Payer: Self-pay | Admitting: Interventional Cardiology

## 2016-06-26 NOTE — Telephone Encounter (Signed)
Informed pt of lab results  

## 2016-06-26 NOTE — Telephone Encounter (Signed)
°  Follow Up   Pt is returning call regarding blood work results. Please call.

## 2016-07-07 ENCOUNTER — Encounter: Payer: Self-pay | Admitting: Internal Medicine

## 2016-07-07 ENCOUNTER — Ambulatory Visit (INDEPENDENT_AMBULATORY_CARE_PROVIDER_SITE_OTHER): Payer: Medicare Other | Admitting: Internal Medicine

## 2016-07-07 VITALS — BP 96/60 | HR 88 | Ht 69.0 in | Wt 229.8 lb

## 2016-07-07 DIAGNOSIS — Z950 Presence of cardiac pacemaker: Secondary | ICD-10-CM | POA: Diagnosis not present

## 2016-07-07 DIAGNOSIS — I5030 Unspecified diastolic (congestive) heart failure: Secondary | ICD-10-CM

## 2016-07-07 DIAGNOSIS — I480 Paroxysmal atrial fibrillation: Secondary | ICD-10-CM

## 2016-07-07 DIAGNOSIS — I495 Sick sinus syndrome: Secondary | ICD-10-CM | POA: Diagnosis not present

## 2016-07-07 MED ORDER — CARVEDILOL 3.125 MG PO TABS: 3.1250 mg | ORAL_TABLET | Freq: Two times a day (BID) | ORAL | 6 refills | Status: DC

## 2016-07-07 MED ORDER — CARVEDILOL 3.125 MG PO TABS: 3.1250 mg | ORAL_TABLET | Freq: Two times a day (BID) | ORAL | 0 refills | Status: DC

## 2016-07-07 NOTE — Patient Instructions (Addendum)
Medication Instructions: Your physician has recommended you make the following change in your medication:   1. STOP Cardura 2. STOP Metoprolol 3. START Carvedilol 3.125 mg -  Take 1 tablet by mouth twice daily   Labwork: None Ordered  Procedures/Testing: None Ordered  Follow-Up: Remote monitoring is used to monitor your Pacemaker of ICD from home. This monitoring reduces the number of office visits required to check your device to one time per year. It allows [place] to keep an eye on the functioning of your device to ensure it is working properly. You are scheduled for a device check from home on 10/06/16. You may send your transmission at any time that day. If you have a wireless device, the transmission will be sent automatically. After your physician reviews your transmission, you will receive a postcard with your next transmission date.  Your physician wants you to follow-up in: 1 YEAR with Dr. Gari Crown will receive a reminder letter in the mail two months in advance. If you don't receive a letter, please call our office to schedule the follow-up appointment.  Dr, Caryl Comes will speak with Dr. Irish Lack in regards to scheduling your Cardia Catherization   Any Additional Special Instructions Will Be Listed Below (If Applicable).     If you need a refill on your cardiac medications before your next appointment, please call your pharmacy.

## 2016-07-07 NOTE — Progress Notes (Signed)
skf      Patient Care Team: [name], MD as PCP - General (Internal Medicine) Jettie Booze, MD (Cardiology)   HPI  Don Jacobs is a 80 y.o. male Seen following pacemaker implantation 5/14 for paroxysmal atrial fibrillation with a rapid rate associated with symptomatic post termination pausing and presyncope. He also has underlying bradycardia.  His CHADS-VASc score is 4, age-68, hypertension-1, and vascular disease- 1.   He is currently on apixaban without bleeding issues  He previously had taking amiodarone and flecainide he did not tolerate. He was started most recently on dofetilide-4/16 which she has tolerated so far. He saw Dr. Greggory Brandy 6/16 with the decision to continue medical therapy with a low threshold for proceeding with catheter ablation  He has been following up in the [facility]. fib clinic. They have a weight loss program ongoing. He has lost 40 pounds.  No symptomatic interval atrial fibrillation since having been seen 3/17. Device interrogation less than 1%  DATE TEST    2010 Cath  Clean Cors  3/16 myoview   no ischemicOr so with his atrial pacing percentage   3/16     echo   EF 40-/45 %   9/17    echio   EF 25-30 %    9/17 Cr   1.40 K3.9  Lisinopril was just initiated  He has significant dyspnea on exertion and fatigue; he has had no edema. He denies exertional chest discomfort.  He is seen back at the request of Dr. Saundra Shelling because of worsening LV function and consideration of CRT upgrade.      Past Medical History:  Diagnosis Date  . Allergic rhinitis   . Aortic aneurysm, thoracic ([place]) 05/25/2011  . BPH (benign prostatic hyperplasia)   . Chronic systolic CHF (congestive heart failure) ([place])   . Dizziness and giddiness 06/04/2015  . ED (erectile dysfunction)   . Gout   . Hearing loss    uses hearing a cyst  . Hx of migraines   . Hypertension   . Hypothyroidism   . Persistent atrial fibrillation ([place])   . Prostatitis, chronic   . Seborrheic keratosis    . Sick sinus syndrome ([facility])    a. s/p PPM   . Sleep apnea   . Spondylolisthesis 07/2010    Past Surgical History:  Procedure Laterality Date  . ANTERIOR CERVICAL DECOMP/DISCECTOMY FUSION  10/2000   "C4, 5, 6 w/fusion" (02/17/2013)  . BACK SURGERY    . CARDIAC CATHETERIZATION  2000's   "once" (02/17/2013)  . CARDIOVERSION N/A 04/09/2014   Procedure: CARDIOVERSION;  Surgeon: Lelon Perla, MD;  Location: [facility] ENDOSCOPY;  Service: Cardiovascular;  Laterality: N/A;  . CARDIOVERSION N/A 01/17/2015   Procedure: CARDIOVERSION;  Surgeon: Larey Dresser, MD;  Location: [facility] END[facility]COPY;  Service: Cardiovascular;  Laterality: N/A;  . FOOT SURGERY Right ~ 2007   "reconstruction" (02/17/2013)  . INSERT / REPLACE / REMOVE PACEMAKER  02/17/2013  . KNEE ARTHROSCOPY Left ~ 2007  . L5 selective nerve root block     Dr Nelva Bush  . LUMBAR SPINE SURGERY  2012   "bone graft, Dr. [name]" (02/17/2013)  . PERMANENT PACEMAKER INSERTION N/A 02/17/2013   Procedure: PERMANENT PACEMAKER INSERTION;  Surgeon: Deboraha Sprang, MD;  Location: [facility] CATH LAB;  Service: Cardiovascular;  Laterality: N/A;  . REPAIR PERONEAL TENDONS ANKLE Right 10/2003  . SHOULDER OPEN ROTATOR CUFF REPAIR Right 09/2007; 2013    Current Outpatient Prescriptions  Medication Sig Dispense Refill  . allopurinol (  ZYLOPRIM) 300 MG tablet Take 300 mg by mouth at bedtime.     Marland Kitchen apixaban (ELIQUIS) 5 MG TABS tablet Take 1 tablet (5 mg total) by mouth 2 (two) times daily. 180 tablet 3  . benzonatate (TESSALON) 100 MG capsule Take 1 capsule (100 mg total) by mouth 3 (three) times daily as needed for cough. 90 capsule 1  . Calcium Carbonate (CALTRATE 600 PO) Take 600 mg by mouth at bedtime.     . Cholecalciferol (D 2000) 2000 units TABS Take 1 tablet by mouth daily.     . cholecalciferol (VITAMIN D) 1000 UNITS tablet Take 1,000 Units by mouth at bedtime.    . Cinnamon 500 MG capsule Take 500 mg by mouth daily.    Wallace Cullens POWD Take 1 capsule by mouth  daily.     . colchicine 0.6 MG tablet Take 0.6 mg by mouth daily as needed (gout).     . cyanocobalamin 500 MCG tablet Take 500 mcg by mouth daily.    Marland Kitchen dofetilide (TIKOSYN) 250 MCG capsule Take 1 capsule (250 mcg total) by mouth 2 (two) times daily. 60 capsule 6  . doxazosin (CARDURA) 4 MG tablet Take 1 tablet (4 mg total) by mouth at bedtime. 30 tablet 11  . furosemide (LASIX) 40 MG tablet Take 1 tablet (40 mg total) by mouth daily. 90 tablet 3  . guaiFENesin (ROBITUSSIN) 100 MG/5ML SOLN Take 10 mLs by mouth every 4 (four) hours as needed for cough or to loosen phlegm.    Marland Kitchen levothyroxine (SYNTHROID, LEVOTHROID) 112 MCG tablet Take 1 tablet (112 mcg total) by mouth daily before breakfast. 30 tablet 3  . lisinopril (PRINIVIL,ZESTRIL) 5 MG tablet Take 0.5 tablets (2.5 mg total) by mouth daily. 30 tablet 3  . loratadine (CLARITIN) 10 MG tablet Take 5 mg by mouth 2 (two) times daily as needed for allergies or rhinitis.     . Magnesium 400 MG CAPS Take 400 mg by mouth 2 (two) times daily.    . metoprolol tartrate (LOPRESSOR) 25 MG tablet Take 25 mg by mouth 2 (two) times daily.    . Misc Natural Products (SAW PALMETTO) CAPS Take 1 capsule by mouth daily.    Marland Kitchen OVER THE COUNTER MEDICATION Take 1 tablet by mouth daily. muscudine seed    . potassium chloride SA (K-DUR,KLOR-CON) 20 MEQ tablet Take 80 mEq by mouth daily. TAKE 2 TABS IN THE A.M. AND TAKE 2 TABS IN THE PM TOTAL OF 80 MG    . predniSONE (DELTASONE) 10 MG tablet Take 10 mg by mouth as needed (GOUT).    . vitamin C (ASCORBIC ACID) 500 MG tablet Take 500 mg by mouth daily.      No current facility-administered medications for this visit.     Allergies  Allergen Reactions  . Penicillins Shortness Of Breath and Rash    Ended up at ER after using  . Oxycodone Other (See Comments)    Altered mental status    Review of Systems negative except from HPI and PMH  Physical Exam BP 96/60   Pulse 88   Ht 5\' 9"  (1.753 m)   Wt 229 lb 12.8 oz  (104.2 kg)   SpO2 97%   BMI 33.94 kg/m   Well developed and well nourished in no acute distress HENT normal E scleral and icterus clear Neck Supple  carotids brisk and full Clear to ausculation Regular rate and rhythm, no murmurs gallops or rub Soft with active bowel sounds  No clubbing cyanosis no Edema Alert and oriented, grossly normal motor and sensory function Skin Warm and Dry  ECG was Not ordered today was reviewed from 9/27. P synchronous pacing with PR interval of 300 ms and left bundle branch block with a QRS duration of 160 ms   Assessment and  Plan\ Sinus node dysfunction  Atrial fibrillation-paroxysmal  Cardiomyopathy-new  Left bundle branch block  First-degree AV block  HFrEF  High Risk Medication Surveillance   Obstructive sleep apnea-treated  Pacemaker-Medtronic The patient's device was interrogated.  The information was reviewed. No changes were made in the programming.     He is not volume overloaded.  I'm concerned about the cause of his LV dysfunction. I will discuss with Dr. Saundra Shelling need for catheterization is been 7 years.  In addition, we will need to see the impact of guideline directed medical therapy. Ace inhibitors have just been initiated. We will switch his metoprolol--carvedilol based on the outcomes data. He will need reassessment of LV function prior to proceeding with device upgrade to help determine whether CRT P versus CRT-D would be most appropriate.  His atrial fibrillation burden is scant.  He is tolerating his anticoagulation without difficulty. His potassium level was a little bit low on his dofetilide.  We will increase his potassium supplementation.  Given his low blood pressure for guideline directed therapy, and is infrequent nocturia, we will discontinue his doxazosin. He will resume it if urinary hesitancy or frequency occurs

## 2016-07-08 ENCOUNTER — Telehealth: Payer: Self-pay | Admitting: Interventional Cardiology

## 2016-07-08 NOTE — Telephone Encounter (Signed)
New message       Calling to get time\/date on heart cath.   Pt was told that he needed a cath and someone would set it up.  Ok to call tomorrow and ok to leave msg on vm

## 2016-07-09 ENCOUNTER — Encounter: Payer: Medicare Other | Admitting: *Deleted

## 2016-07-09 ENCOUNTER — Telehealth: Payer: Self-pay | Admitting: Cardiology

## 2016-07-09 NOTE — Telephone Encounter (Signed)
The pts wife, Lenell Antu, is advised that once Dr Caryl Comes and Dr Irish Lack discuss the pts need for a cath we will contact them with their recommendations. Hilda verbalized understanding and is in agreement with plan.  Will forward message to Dr Caryl Comes and Dr Irish Lack for advisement.

## 2016-07-09 NOTE — Telephone Encounter (Signed)
I spoke with Dr Irish Lack this am and he is unaware of pt needing a cath. Will forward message to [facility], Dr Aquilla Hacker nurse.

## 2016-07-09 NOTE — Telephone Encounter (Signed)
Spoke with pt and reminded pt of remote transmission that is due today. Pt verbalized understanding.   

## 2016-07-09 NOTE — Telephone Encounter (Signed)
Spoke with Dr Saundra Shelling and he will be in touch w pt tomorrow 10/13

## 2016-07-09 NOTE — Telephone Encounter (Signed)
Dr. Caryl Comes sent Dr. Irish Lack a text to please call him.

## 2016-07-10 ENCOUNTER — Other Ambulatory Visit: Payer: Self-pay

## 2016-07-10 DIAGNOSIS — Z01810 Encounter for preprocedural cardiovascular examination: Secondary | ICD-10-CM

## 2016-07-10 DIAGNOSIS — I480 Paroxysmal atrial fibrillation: Secondary | ICD-10-CM

## 2016-07-10 NOTE — Telephone Encounter (Signed)
The pts wife states that the pt is in agreement to have a cath.  The pts wife is advised that the pt is scheduled to have his cath on 07/20/16 at 9 am with Dr Irish Lack.  I went over cath instructions with the pts wife who repeated all back to me correctly.  Please see Cath instructions letter in the pts chart for details on cath.  The pts wife is advised that when they come to the office on 10/18 for the pts pre cath labs to ask at the front desk for the pts instruction letter.  The pts wife verbalized understanding to all information given.

## 2016-07-10 NOTE — Telephone Encounter (Signed)
LMTCB

## 2016-07-10 NOTE — Telephone Encounter (Signed)
OK to schedule cath to see if most recent [facility] is related to CAD progression.  Please schedule it when I am in the cath lab.

## 2016-07-10 NOTE — Telephone Encounter (Signed)
Pt's wife called back.  Please giver her a call back.

## 2016-07-15 ENCOUNTER — Ambulatory Visit
Admission: RE | Admit: 2016-07-15 | Discharge: 2016-07-15 | Disposition: A | Discharge status: Home / Self Care | Payer: Medicare Other | Source: Ambulatory Visit | Attending: Surgery | Admitting: Surgery

## 2016-07-15 ENCOUNTER — Other Ambulatory Visit: Payer: Medicare Other | Admitting: *Deleted

## 2016-07-15 ENCOUNTER — Encounter: Payer: Self-pay | Admitting: Surgery

## 2016-07-15 ENCOUNTER — Ambulatory Visit (INDEPENDENT_AMBULATORY_CARE_PROVIDER_SITE_OTHER): Payer: Medicare Other | Admitting: Surgery

## 2016-07-15 VITALS — BP 99/65 | HR 88 | Resp 20 | Ht 69.0 in | Wt 229.0 lb

## 2016-07-15 DIAGNOSIS — Z7901 Long term (current) use of anticoagulants: Secondary | ICD-10-CM | POA: Diagnosis not present

## 2016-07-15 DIAGNOSIS — I712 Thoracic aortic aneurysm, without rupture, unspecified: Secondary | ICD-10-CM

## 2016-07-15 DIAGNOSIS — I7121 Aneurysm of the ascending aorta, without rupture: Secondary | ICD-10-CM

## 2016-07-15 DIAGNOSIS — Z01810 Encounter for preprocedural cardiovascular examination: Secondary | ICD-10-CM | POA: Diagnosis not present

## 2016-07-15 LAB — PROTIME-INR
INR: 1.1
Prothrombin Time: 12.1 s — ABNORMAL HIGH (ref 9.0–11.5)

## 2016-07-15 LAB — CBC WITH DIFFERENTIAL/PLATELET
BASOS ABS: 40 {cells}/uL (ref 0–200)
Basophils Relative: 1 %
Eosinophils Absolute: 200 cells/uL (ref 15–500)
Eosinophils Relative: 5 %
HEMATOCRIT: 35.2 % — AB (ref 38.5–50.0)
HEMOGLOBIN: 11.9 g/dL — AB (ref 13.2–17.1)
LYMPHS ABS: 840 {cells}/uL — AB (ref 850–3900)
LYMPHS PCT: 21 %
MCH: 31.1 pg (ref 27.0–33.0)
MCHC: 33.8 g/dL (ref 32.0–36.0)
MCV: 91.9 fL (ref 80.0–100.0)
MONO ABS: 320 {cells}/uL (ref 200–950)
MPV: 10.3 fL (ref 7.5–12.5)
Monocytes Relative: 8 %
NEUTROS PCT: 65 %
Neutro Abs: 2600 cells/uL (ref 1500–7800)
Platelets: 143 10*3/uL (ref 140–400)
RBC: 3.83 MIL/uL — AB (ref 4.20–5.80)
RDW: 14.5 % (ref 11.0–15.0)
WBC: 4 10*3/uL (ref 3.8–10.8)

## 2016-07-15 LAB — BASIC METABOLIC PANEL
BUN: 17 mg/dL (ref 7–25)
CALCIUM: 9.1 mg/dL (ref 8.6–10.3)
CO2: 27 mmol/L (ref 20–31)
Chloride: 103 mmol/L (ref 98–110)
Creat: 1.32 mg/dL — ABNORMAL HIGH (ref 0.70–1.18)
GLUCOSE: 86 mg/dL (ref 65–99)
POTASSIUM: 4.4 mmol/L (ref 3.5–5.3)
Sodium: 139 mmol/L (ref 135–146)

## 2016-07-15 MED ORDER — IOPAMIDOL (ISOVUE-370) INJECTION 76%
75.0000 mL | Freq: Once | INTRAVENOUS | Status: AC | PRN
  Administered 2016-07-15: 75 mL via INTRAVENOUS

## 2016-07-16 ENCOUNTER — Encounter: Payer: Self-pay | Admitting: Surgery

## 2016-07-16 ENCOUNTER — Telehealth: Payer: Self-pay | Admitting: Interventional Cardiology

## 2016-07-16 NOTE — Progress Notes (Signed)
HPI:  Patient returns today for followup of an ascending aortic aneurysm that had been stable at 4.7-4.8 cm since I saw him in September 2013 and  last year was measured at 4.9 cm. He has a pacer place for PAF with RVR with symptomatic post-termination pausing and presyncope with bradycardia. He has been having more shortness of breath and had an echo 06/18/2016 showing a drop in his EF to 25-30%. His previous echo in 11/2014 had shown an EF of 40-45% and in 01/2014 it was 55-60%. He is scheduled for a cath to rule out coronary disease as a cause of his cardiomyopathy.  Current Outpatient Prescriptions  Medication Sig Dispense Refill  . allopurinol (ZYLOPRIM) 300 MG tablet Take 300 mg by mouth at bedtime.     Marland Kitchen apixaban (ELIQUIS) 5 MG TABS tablet Take 1 tablet (5 mg total) by mouth 2 (two) times daily. 180 tablet 3  . benzonatate (TESSALON) 100 MG capsule Take 1 capsule (100 mg total) by mouth 3 (three) times daily as needed for cough. 90 capsule 1  . Calcium Carbonate (CALTRATE 600 PO) Take 600 mg by mouth at bedtime.     . carvedilol (COREG) 3.125 MG tablet Take 1 tablet (3.125 mg total) by mouth 2 (two) times daily. 60 tablet 6  . Cholecalciferol (D 2000) 2000 units TABS Take 1 tablet by mouth daily.     . cholecalciferol (VITAMIN D) 1000 UNITS tablet Take 1,000 Units by mouth at bedtime.    . Cinnamon 500 MG capsule Take 500 mg by mouth daily.    Wallace Cullens POWD Take 1 capsule by mouth daily.     . colchicine 0.6 MG tablet Take 0.6 mg by mouth daily as needed (gout).     . cyanocobalamin 500 MCG tablet Take 500 mcg by mouth daily.    Marland Kitchen dofetilide (TIKOSYN) 250 MCG capsule Take 1 capsule (250 mcg total) by mouth 2 (two) times daily. 60 capsule 6  . furosemide (LASIX) 40 MG tablet Take 1 tablet (40 mg total) by mouth daily. 90 tablet 3  . guaiFENesin (ROBITUSSIN) 100 MG/5ML SOLN Take 10 mLs by mouth every 4 (four) hours as needed for cough or to loosen phlegm.    Marland Kitchen levothyroxine  (SYNTHROID, LEVOTHROID) 112 MCG tablet Take 1 tablet (112 mcg total) by mouth daily before breakfast. 30 tablet 3  . lisinopril (PRINIVIL,ZESTRIL) 5 MG tablet Take 0.5 tablets (2.5 mg total) by mouth daily. 30 tablet 3  . loratadine (CLARITIN) 10 MG tablet Take 5 mg by mouth 2 (two) times daily as needed for allergies or rhinitis.     . Magnesium 400 MG CAPS Take 400 mg by mouth 2 (two) times daily.    . Misc Natural Products (SAW PALMETTO) CAPS Take 1 capsule by mouth daily.    Marland Kitchen OVER THE COUNTER MEDICATION Take 1 tablet by mouth daily. muscudine seed    . potassium chloride SA (K-DUR,KLOR-CON) 20 MEQ tablet Take 80 mEq by mouth daily. TAKE 2 TABS IN THE A.M. AND TAKE 2 TABS IN THE PM TOTAL OF 80 MG    . predniSONE (DELTASONE) 10 MG tablet Take 10 mg by mouth as needed (GOUT).    . vitamin C (ASCORBIC ACID) 500 MG tablet Take 500 mg by mouth daily.      No current facility-administered medications for this visit.      Physical Exam: BP 99/65 (BP Location: Left Arm, Patient Position: Sitting, Cuff Size: Normal)  Pulse 88   Resp 20   Ht 5\' 9"  (1.753 m)   Wt 229 lb (103.9 kg)   SpO2 96% Comment: RA  BMI 33.82 kg/m  He looks well Cardiac exam shows a regular rate and rhythm with normal heart sounds. There is no murmur Lungs are clear There is no peripheral edema  Diagnostic Tests:  CLINICAL DATA:  Followup thoracic aortic aneurysm  EXAM: CT ANGIOGRAPHY CHEST WITH CONTRAST  TECHNIQUE: Multidetector CT imaging of the chest was performed using the standard protocol during bolus administration of intravenous contrast. Multiplanar CT image reconstructions and MIPs were obtained to evaluate the vascular anatomy.  CONTRAST:  75 mL Isovue 370  COMPARISON:  06/16/2016, 07/24/2015  FINDINGS: Cardiovascular: Thoracic aorta again demonstrates some calcifications diffusely. The ascending aorta at the level of the pulmonary artery measures 4.8 cm. This is roughly similar to  that seen on the prior exam. Normal tapering is noted distally. The sino-tubular junction measures 4.0 cm which is again roughly stable from the prior exam. Proximal descending thoracic aorta measures approximately 3.4 cm in the distal descending thoracic aorta at the level of the aortic hiatus measures approximately 3 cm. These are again roughly stable from the prior exam. No evidence of dissection is noted.  The pulmonary artery as visualized shows no large central pulmonary emboli. Scattered moderate coronary calcifications are seen. A pacing device is again noted. The cardiac structures are stable in appearance. The brachiocephalic vessels are within normal limits.  Mediastinum/Nodes: The thoracic inlet is within normal limits. No significant hilar or mediastinal adenopathy is noted. Scattered small mediastinal lymph nodes are again seen and stable.  Lungs/Pleura: Lungs are well aerated bilaterally without focal infiltrate or sizable effusion. A stable 7 mm nodule is noted adjacent to the major fissure best seen on image number 60 of series 5. A slightly smaller lesion is noted just superior to this along the major fissure. These likely represent intrapleural lymph nodes and are stable. Small 4 mm nodule is noted in lateral aspect of the right upper lobe best seen on image number 57 of series 5.  Upper Abdomen: The liver is diffusely fatty infiltrated. No other focal abnormality is noted in the upper abdomen.  Musculoskeletal: Degenerative changes of the thoracic spine are again seen and stable.  Review of the MIP images confirms the above findings.  IMPRESSION: Stable appearing ascending aortic aneurysm. Recommend semi-annual imaging followup by CTA or MRA and referral to cardiothoracic surgery if not already obtained. This recommendation follows 2010 ACCF/AHA/AATS/ACR/ASA/SCA/SCAI/SIR/STS/SVM Guidelines for the Diagnosis and Management of Patients With Thoracic  Aortic Disease. Circulation. 2010; 121: [id number]  Stable nodules within the left lower lobe along the major fissure. A tiny 4 mm right upper lobe nodule is also noted was not well appreciated on the prior exam. These can be again followed all on routine followup imaging of the he aneurysm.  Coronary artery calcifications stable from the prior study.   Electronically Signed   By: Inez Catalina M.D.   On: 07/15/2016 11:04   Impression:  I have personally reviewed and interpreted his prior and current CT scans. The fusiform ascending aortic aneurysm is stable at 4.8 cm dating back to 2013.  This does not require surgical repair at this time. His BP is under good control. He will have a cardiac cath in the near future to evaluate for coronary disease.  Plan:  I will see him back in 1 year with a CTA of the chest to follow up  on his ascending aortic aneurysm.       Gaye Pollack, MD [facility] [phone]

## 2016-07-16 NOTE — Telephone Encounter (Signed)
Follow Up:; ° ° °Returning your call. °

## 2016-07-16 NOTE — Telephone Encounter (Signed)
PT AWARE OF LAB RESULTS./CY 

## 2016-07-17 ENCOUNTER — Other Ambulatory Visit: Payer: Self-pay | Admitting: Interventional Cardiology

## 2016-07-20 ENCOUNTER — Ambulatory Visit (HOSPITAL_COMMUNITY)
Admission: RE | Admit: 2016-07-20 | Discharge: 2016-07-20 | Disposition: A | Discharge status: Home / Self Care | Payer: Medicare Other | Source: Ambulatory Visit | Attending: Interventional Cardiology | Admitting: Interventional Cardiology

## 2016-07-20 ENCOUNTER — Other Ambulatory Visit: Payer: Self-pay | Admitting: Internal Medicine

## 2016-07-20 DIAGNOSIS — R0602 Shortness of breath: Secondary | ICD-10-CM | POA: Diagnosis not present

## 2016-07-20 MED ORDER — ASPIRIN 81 MG PO CHEW
CHEWABLE_TABLET | ORAL | Status: AC
  Filled 2016-07-20: qty 1

## 2016-07-20 MED ORDER — ASPIRIN 81 MG PO CHEW
81.0000 mg | CHEWABLE_TABLET | ORAL | Status: AC
  Administered 2016-07-20: 81 mg via ORAL

## 2016-07-20 MED ORDER — SODIUM CHLORIDE 0.9% FLUSH: 3.0000 mL | Freq: Two times a day (BID) | INTRAVENOUS | Status: DC

## 2016-07-20 MED ORDER — SODIUM CHLORIDE 0.9 % IV SOLN: 250.0000 mL | INTRAVENOUS | Status: DC | PRN

## 2016-07-20 MED ORDER — SODIUM CHLORIDE 0.9% FLUSH: 3.0000 mL | INTRAVENOUS | Status: DC | PRN

## 2016-07-20 MED ORDER — SODIUM CHLORIDE 0.9 % IV SOLN
INTRAVENOUS | Status: DC
  Administered 2016-07-20: 08:00:00 via INTRAVENOUS

## 2016-07-23 ENCOUNTER — Encounter (HOSPITAL_COMMUNITY)
Admission: RE | Disposition: A | Discharge status: Home / Self Care | Payer: Self-pay | Source: Ambulatory Visit | Attending: Interventional Cardiology

## 2016-07-23 ENCOUNTER — Encounter (HOSPITAL_COMMUNITY): Payer: Self-pay | Admitting: Interventional Cardiology

## 2016-07-23 ENCOUNTER — Ambulatory Visit (HOSPITAL_COMMUNITY)
Admission: RE | Admit: 2016-07-23 | Discharge: 2016-07-23 | Disposition: A | Discharge status: Home / Self Care | Payer: Medicare Other | Source: Ambulatory Visit | Attending: Interventional Cardiology | Admitting: Interventional Cardiology

## 2016-07-23 DIAGNOSIS — Z7901 Long term (current) use of anticoagulants: Secondary | ICD-10-CM | POA: Diagnosis not present

## 2016-07-23 DIAGNOSIS — I5022 Chronic systolic (congestive) heart failure: Secondary | ICD-10-CM | POA: Diagnosis not present

## 2016-07-23 DIAGNOSIS — Z880 Allergy status to penicillin: Secondary | ICD-10-CM | POA: Diagnosis not present

## 2016-07-23 DIAGNOSIS — I447 Left bundle-branch block, unspecified: Secondary | ICD-10-CM | POA: Insufficient documentation

## 2016-07-23 DIAGNOSIS — I110 Hypertensive heart disease with heart failure: Secondary | ICD-10-CM | POA: Insufficient documentation

## 2016-07-23 DIAGNOSIS — E039 Hypothyroidism, unspecified: Secondary | ICD-10-CM | POA: Insufficient documentation

## 2016-07-23 DIAGNOSIS — G43909 Migraine, unspecified, not intractable, without status migrainosus: Secondary | ICD-10-CM | POA: Insufficient documentation

## 2016-07-23 DIAGNOSIS — I440 Atrioventricular block, first degree: Secondary | ICD-10-CM | POA: Insufficient documentation

## 2016-07-23 DIAGNOSIS — I428 Other cardiomyopathies: Secondary | ICD-10-CM

## 2016-07-23 DIAGNOSIS — I712 Thoracic aortic aneurysm, without rupture: Secondary | ICD-10-CM | POA: Diagnosis not present

## 2016-07-23 DIAGNOSIS — I481 Persistent atrial fibrillation: Secondary | ICD-10-CM | POA: Insufficient documentation

## 2016-07-23 DIAGNOSIS — Z950 Presence of cardiac pacemaker: Secondary | ICD-10-CM | POA: Insufficient documentation

## 2016-07-23 DIAGNOSIS — N400 Enlarged prostate without lower urinary tract symptoms: Secondary | ICD-10-CM | POA: Insufficient documentation

## 2016-07-23 DIAGNOSIS — I495 Sick sinus syndrome: Secondary | ICD-10-CM | POA: Insufficient documentation

## 2016-07-23 DIAGNOSIS — I480 Paroxysmal atrial fibrillation: Secondary | ICD-10-CM | POA: Diagnosis not present

## 2016-07-23 DIAGNOSIS — J309 Allergic rhinitis, unspecified: Secondary | ICD-10-CM | POA: Insufficient documentation

## 2016-07-23 DIAGNOSIS — N529 Male erectile dysfunction, unspecified: Secondary | ICD-10-CM | POA: Insufficient documentation

## 2016-07-23 DIAGNOSIS — G4733 Obstructive sleep apnea (adult) (pediatric): Secondary | ICD-10-CM | POA: Insufficient documentation

## 2016-07-23 DIAGNOSIS — M109 Gout, unspecified: Secondary | ICD-10-CM | POA: Insufficient documentation

## 2016-07-23 HISTORY — PX: CARDIAC CATHETERIZATION: SHX172

## 2016-07-23 LAB — POCT I-STAT 3, VENOUS BLOOD GAS (G3P V)
ACID-BASE EXCESS: 1 mmol/L (ref 0.0–2.0)
BICARBONATE: 27.1 mmol/L (ref 20.0–28.0)
O2 SAT: 67 %
PCO2 VEN: 46.8 mmHg (ref 44.0–60.0)
PO2 VEN: 36 mmHg (ref 32.0–45.0)
TCO2: 28 mmol/L (ref 0–100)
pH, Ven: 7.37 (ref 7.250–7.430)

## 2016-07-23 LAB — CUP PACEART INCLINIC DEVICE CHECK
Brady Statistic AP VS Percent: 98.44 %
Brady Statistic RA Percent Paced: 98.48 %
Implantable Lead Implant Date: 20140523
Implantable Lead Location: 753859
Implantable Lead Model: 5076
Implantable Lead Model: 5076
Lead Channel Impedance Value: 323 Ohm
Lead Channel Impedance Value: 437 Ohm
Lead Channel Pacing Threshold Amplitude: 0.75 V
Lead Channel Pacing Threshold Pulse Width: 0.4 ms
Lead Channel Sensing Intrinsic Amplitude: 1.875 mV
Lead Channel Sensing Intrinsic Amplitude: 11.625 mV
Lead Channel Setting Pacing Amplitude: 2 V
Lead Channel Setting Pacing Amplitude: 2.5 V
MDC IDC LEAD IMPLANT DT: 20140523
MDC IDC LEAD LOCATION: 753860
MDC IDC MSMT BATTERY REMAINING LONGEVITY: 68 mo
MDC IDC MSMT BATTERY VOLTAGE: 3 V
MDC IDC MSMT LEADCHNL RA PACING THRESHOLD PULSEWIDTH: 0.4 ms
MDC IDC MSMT LEADCHNL RV IMPEDANCE VALUE: 437 Ohm
MDC IDC MSMT LEADCHNL RV IMPEDANCE VALUE: 494 Ohm
MDC IDC MSMT LEADCHNL RV PACING THRESHOLD AMPLITUDE: 0.75 V
MDC IDC SESS DTM: 20171010130844
MDC IDC SET LEADCHNL RV PACING PULSEWIDTH: 0.4 ms
MDC IDC SET LEADCHNL RV SENSING SENSITIVITY: 4 mV
MDC IDC STAT BRADY AP VP PERCENT: 0.05 %
MDC IDC STAT BRADY AS VP PERCENT: 0.01 %
MDC IDC STAT BRADY AS VS PERCENT: 1.51 %
MDC IDC STAT BRADY RV PERCENT PACED: 0.06 %

## 2016-07-23 LAB — POCT I-STAT 3, ART BLOOD GAS (G3+)
Acid-Base Excess: 1 mmol/L (ref 0.0–2.0)
BICARBONATE: 26 mmol/L (ref 20.0–28.0)
O2 Saturation: 91 %
PH ART: 7.391 (ref 7.350–7.450)
PO2 ART: 61 mmHg — AB (ref 83.0–108.0)
TCO2: 27 mmol/L (ref 0–100)
pCO2 arterial: 42.8 mmHg (ref 32.0–48.0)

## 2016-07-23 SURGERY — RIGHT/LEFT HEART CATH AND CORONARY ANGIOGRAPHY

## 2016-07-23 MED ORDER — APIXABAN 5 MG PO TABS: 5.0000 mg | ORAL_TABLET | Freq: Two times a day (BID) | ORAL | 3 refills | Status: DC

## 2016-07-23 MED ORDER — IOPAMIDOL (ISOVUE-370) INJECTION 76%
INTRAVENOUS | Status: DC | PRN
  Administered 2016-07-23: 50 mL via INTRA_ARTERIAL

## 2016-07-23 MED ORDER — SODIUM CHLORIDE 0.9 % IV SOLN
INTRAVENOUS | Status: DC
  Administered 2016-07-23: 08:00:00 via INTRAVENOUS

## 2016-07-23 MED ORDER — HEPARIN SODIUM (PORCINE) 1000 UNIT/ML IJ SOLN
INTRAMUSCULAR | Status: AC
  Filled 2016-07-23: qty 1

## 2016-07-23 MED ORDER — SODIUM CHLORIDE 0.9% FLUSH: 3.0000 mL | INTRAVENOUS | Status: DC | PRN

## 2016-07-23 MED ORDER — ASPIRIN 81 MG PO CHEW
81.0000 mg | CHEWABLE_TABLET | ORAL | Status: AC
  Administered 2016-07-23: 81 mg via ORAL

## 2016-07-23 MED ORDER — FENTANYL CITRATE (PF) 100 MCG/2ML IJ SOLN
INTRAMUSCULAR | Status: DC | PRN
  Administered 2016-07-23: 25 ug via INTRAVENOUS

## 2016-07-23 MED ORDER — ASPIRIN 81 MG PO CHEW
CHEWABLE_TABLET | ORAL | Status: AC
  Administered 2016-07-23: 81 mg via ORAL
  Filled 2016-07-23: qty 1

## 2016-07-23 MED ORDER — FENTANYL CITRATE (PF) 100 MCG/2ML IJ SOLN
INTRAMUSCULAR | Status: AC
  Filled 2016-07-23: qty 2

## 2016-07-23 MED ORDER — LIDOCAINE HCL (PF) 1 % IJ SOLN
INTRAMUSCULAR | Status: AC
  Filled 2016-07-23: qty 30

## 2016-07-23 MED ORDER — SODIUM CHLORIDE 0.9% FLUSH: 3.0000 mL | Freq: Two times a day (BID) | INTRAVENOUS | Status: DC

## 2016-07-23 MED ORDER — MIDAZOLAM HCL 2 MG/2ML IJ SOLN
INTRAMUSCULAR | Status: AC
  Filled 2016-07-23: qty 2

## 2016-07-23 MED ORDER — SODIUM CHLORIDE 0.9 % IV SOLN: 250.0000 mL | INTRAVENOUS | Status: DC | PRN

## 2016-07-23 MED ORDER — MIDAZOLAM HCL 2 MG/2ML IJ SOLN
INTRAMUSCULAR | Status: DC | PRN
  Administered 2016-07-23: 2 mg via INTRAVENOUS

## 2016-07-23 MED ORDER — HEPARIN SODIUM (PORCINE) 1000 UNIT/ML IJ SOLN
INTRAMUSCULAR | Status: DC | PRN
  Administered 2016-07-23: 5000 [IU] via INTRAVENOUS

## 2016-07-23 MED ORDER — SODIUM CHLORIDE 0.9 % IV SOLN: INTRAVENOUS | Status: AC

## 2016-07-23 MED ORDER — LIDOCAINE HCL (PF) 1 % IJ SOLN
INTRAMUSCULAR | Status: DC | PRN
  Administered 2016-07-23 (×2): 2 mL via SUBCUTANEOUS

## 2016-07-23 MED ORDER — IOPAMIDOL (ISOVUE-370) INJECTION 76%
INTRAVENOUS | Status: AC
  Filled 2016-07-23: qty 100

## 2016-07-23 MED ORDER — HEPARIN (PORCINE) IN NACL 2-0.9 UNIT/ML-% IJ SOLN
INTRAMUSCULAR | Status: DC | PRN
  Administered 2016-07-23: 1000 mL

## 2016-07-23 MED ORDER — VERAPAMIL HCL 2.5 MG/ML IV SOLN
INTRAVENOUS | Status: AC
  Filled 2016-07-23: qty 2

## 2016-07-23 MED ORDER — HEPARIN (PORCINE) IN NACL 2-0.9 UNIT/ML-% IJ SOLN
INTRAMUSCULAR | Status: AC
  Filled 2016-07-23: qty 1000

## 2016-07-23 MED ORDER — VERAPAMIL HCL 2.5 MG/ML IV SOLN
INTRAVENOUS | Status: DC | PRN
  Administered 2016-07-23: 10 mL via INTRA_ARTERIAL

## 2016-07-23 SURGICAL SUPPLY — 15 items
CATH BALLN WEDGE 5F 110CM (CATHETERS) ×2 IMPLANT
CATH INFINITI 5 FR JL3.5 (CATHETERS) ×2 IMPLANT
CATH INFINITI 5FR ANG PIGTAIL (CATHETERS) ×2 IMPLANT
CATH INFINITI JR4 5F (CATHETERS) ×2 IMPLANT
DEVICE RAD COMP TR BAND LRG (VASCULAR PRODUCTS) ×2 IMPLANT
GLIDESHEATH SLEND SS 6F .021 (SHEATH) ×2 IMPLANT
KIT HEART LEFT (KITS) ×3 IMPLANT
PACK CARDIAC CATHETERIZATION (CUSTOM PROCEDURE TRAY) ×3 IMPLANT
SHEATH FAST CATH BRACH 5F 5CM (SHEATH) ×2 IMPLANT
SYR MEDRAD MARK V 150ML (SYRINGE) ×3 IMPLANT
TRANSDUCER W/STOPCOCK (MISCELLANEOUS) ×4 IMPLANT
TUBING ART PRESS 72  MALE/FEM (TUBING) ×2
TUBING ART PRESS 72 MALE/FEM (TUBING) IMPLANT
TUBING CIL FLEX 10 FLL-RA (TUBING) ×3 IMPLANT
WIRE SAFE-T 1.5MM-J .035X260CM (WIRE) ×2 IMPLANT

## 2016-07-23 NOTE — Discharge Instructions (Signed)
Radial Site Care °Refer to this sheet in the next few weeks. These instructions provide you with information about caring for yourself after your procedure. Your health care provider may also give you more specific instructions. Your treatment has been planned according to current medical practices, but problems sometimes occur. Call your health care provider if you have any problems or questions after your procedure. °WHAT TO EXPECT AFTER THE PROCEDURE °After your procedure, it is typical to have the following: °· Bruising at the radial site that usually fades within 1-2 weeks. °· Blood collecting in the tissue (hematoma) that may be painful to the touch. It should usually decrease in size and tenderness within 1-2 weeks. °HOME CARE INSTRUCTIONS °· Take medicines only as directed by your health care provider. °· You may shower 24-48 hours after the procedure or as directed by your health care provider. Remove the bandage (dressing) and gently wash the site with plain soap and water. Pat the area dry with a clean towel. Do not rub the site, because this may cause bleeding. °· Do not take baths, swim, or use a hot tub until your health care provider approves. °· Check your insertion site every day for redness, swelling, or drainage. °· Do not apply powder or lotion to the site. °· Do not flex or bend the affected arm for 24 hours or as directed by your health care provider. °· Do not push or pull heavy objects with the affected arm for 24 hours or as directed by your health care provider. °· Do not lift over 10 lb (4.5 kg) for 5 days after your procedure or as directed by your health care provider. °· Ask your health care provider when it is okay to: °¨ Return to work or school. °¨ Resume usual physical activities or sports. °¨ Resume sexual activity. °· Do not drive home if you are discharged the same day as the procedure. Have someone else drive you. °· You may drive 24 hours after the procedure unless otherwise  instructed by your health care provider. °· Do not operate machinery or power tools for 24 hours after the procedure. °· If your procedure was done as an outpatient procedure, which means that you went home the same day as your procedure, a responsible adult should be with you for the first 24 hours after you arrive home. °· Keep all follow-up visits as directed by your health care provider. This is important. °SEEK MEDICAL CARE IF: °· You have a fever. °· You have chills. °· You have increased bleeding from the radial site. Hold pressure on the site. CALL 911 °SEEK IMMEDIATE MEDICAL CARE IF: °· You have unusual pain at the radial site. °· You have redness, warmth, or swelling at the radial site. °· You have drainage (other than a small amount of blood on the dressing) from the radial site. °· The radial site is bleeding, and the bleeding does not stop after 30 minutes of holding steady pressure on the site. °· Your arm or hand becomes pale, cool, tingly, or numb. °  °This information is not intended to replace advice given to you by your health care provider. Make sure you discuss any questions you have with your health care provider. °  °Document Released: 10/17/2010 Document Revised: 10/05/2014 Document Reviewed: 04/02/2014 °Elsevier Interactive Patient Education ©2016 [employer] ° °

## 2016-07-23 NOTE — Interval H&P Note (Signed)
Cath Lab Visit (complete for each Cath Lab visit)  Clinical Evaluation Leading to the Procedure:   ACS: No.  Non-ACS:    Anginal Classification: CCS II  Anti-ischemic medical therapy: Minimal Therapy (1 class of medications)  Non-Invasive Test Results: High-risk stress test findings: cardiac mortality >3%/year; low EF  Prior CABG: No previous CABG   Evaluating possible change in EF.   History and Physical Interval Note:  07/23/2016 9:27 AM  [name]  has presented today for surgery, with the diagnosis of abnormal echo - shortness of breath  The various methods of treatment have been discussed with the patient and family. After consideration of risks, benefits and other options for treatment, the patient has consented to  Procedure(s): Right/Left Heart Cath and Coronary Angiography (N/A) as a surgical intervention .  The patient's history has been reviewed, patient examined, no change in status, stable for surgery.  I have reviewed the patient's chart and labs.  Questions were answered to the patient's satisfaction.     Larae Grooms

## 2016-07-23 NOTE — H&P (View-Only) (Signed)
skf      Patient Care Team: [name], MD as PCP - General (Internal Medicine) Jettie Booze, MD (Cardiology)   HPI  Don Jacobs is a 80 y.o. male Seen following pacemaker implantation 5/14 for paroxysmal atrial fibrillation with a rapid rate associated with symptomatic post termination pausing and presyncope. He also has underlying bradycardia.  His CHADS-VASc score is 4, age-2, hypertension-1, and vascular disease- 1.   He is currently on apixaban without bleeding issues  He previously had taking amiodarone and flecainide he did not tolerate. He was started most recently on dofetilide-4/16 which she has tolerated so far. He saw Dr. Greggory Brandy 6/16 with the decision to continue medical therapy with a low threshold for proceeding with catheter ablation  He has been following up in the A. fib clinic. They have a weight loss program ongoing. He has lost 40 pounds.  No symptomatic interval atrial fibrillation since having been seen 3/17. Device interrogation less than 1%  DATE TEST    2010 Cath  Clean Cors  3/16 myoview   no ischemicOr so with his atrial pacing percentage   3/16     echo   EF 40-/45 %   9/17    echio   EF 25-30 %    9/17 Cr   1.40 K3.9  Lisinopril was just initiated  He has significant dyspnea on exertion and fatigue; he has had no edema. He denies exertional chest discomfort.  He is seen back at the request of Dr. Saundra Shelling because of worsening LV function and consideration of CRT upgrade.      Past Medical History:  Diagnosis Date  . Allergic rhinitis   . Aortic aneurysm, thoracic ([place]) 05/25/2011  . BPH (benign prostatic hyperplasia)   . Chronic systolic CHF (congestive heart failure) ([place])   . Dizziness and giddiness 06/04/2015  . ED (erectile dysfunction)   . Gout   . Hearing loss    uses hearing a cyst  . Hx of migraines   . Hypertension   . Hypothyroidism   . Persistent atrial fibrillation ([place])   . Prostatitis, chronic   . Seborrheic keratosis    . Sick sinus syndrome (HCC)    a. s/p PPM   . Sleep apnea   . Spondylolisthesis 07/2010    Past Surgical History:  Procedure Laterality Date  . ANTERIOR CERVICAL DECOMP/DISCECTOMY FUSION  10/2000   "C4, 5, 6 w/fusion" (02/17/2013)  . BACK SURGERY    . CARDIAC CATHETERIZATION  2000's   "once" (02/17/2013)  . CARDIOVERSION N/A 04/09/2014   Procedure: CARDIOVERSION;  Surgeon: Lelon Perla, MD;  Location: [facility] ENDOSCOPY;  Service: Cardiovascular;  Laterality: N/A;  . CARDIOVERSION N/A 01/17/2015   Procedure: CARDIOVERSION;  Surgeon: Larey Dresser, MD;  Location: [facility] ENDOSCOPY;  Service: Cardiovascular;  Laterality: N/A;  . FOOT SURGERY Right ~ 2007   "reconstruction" (02/17/2013)  . INSERT / REPLACE / REMOVE PACEMAKER  02/17/2013  . KNEE ARTHROSCOPY Left ~ 2007  . L5 selective nerve root block     Dr Nelva Bush  . LUMBAR SPINE SURGERY  2012   "bone graft, Dr. [name]" (02/17/2013)  . PERMANENT PACEMAKER INSERTION N/A 02/17/2013   Procedure: PERMANENT PACEMAKER INSERTION;  Surgeon: Deboraha Sprang, MD;  Location: [facility] CATH LAB;  Service: Cardiovascular;  Laterality: N/A;  . REPAIR PERONEAL TENDONS ANKLE Right 10/2003  . SHOULDER OPEN ROTATOR CUFF REPAIR Right 09/2007; 2013    Current Outpatient Prescriptions  Medication Sig Dispense Refill  . allopurinol (  ZYLOPRIM) 300 MG tablet Take 300 mg by mouth at bedtime.     Marland Kitchen apixaban (ELIQUIS) 5 MG TABS tablet Take 1 tablet (5 mg total) by mouth 2 (two) times daily. 180 tablet 3  . benzonatate (TESSALON) 100 MG capsule Take 1 capsule (100 mg total) by mouth 3 (three) times daily as needed for cough. 90 capsule 1  . Calcium Carbonate (CALTRATE 600 PO) Take 600 mg by mouth at bedtime.     . Cholecalciferol (D 2000) 2000 units TABS Take 1 tablet by mouth daily.     . cholecalciferol (VITAMIN D) 1000 UNITS tablet Take 1,000 Units by mouth at bedtime.    . Cinnamon 500 MG capsule Take 500 mg by mouth daily.    Wallace Cullens POWD Take 1 capsule by mouth  daily.     . colchicine 0.6 MG tablet Take 0.6 mg by mouth daily as needed (gout).     . cyanocobalamin 500 MCG tablet Take 500 mcg by mouth daily.    Marland Kitchen dofetilide (TIKOSYN) 250 MCG capsule Take 1 capsule (250 mcg total) by mouth 2 (two) times daily. 60 capsule 6  . doxazosin (CARDURA) 4 MG tablet Take 1 tablet (4 mg total) by mouth at bedtime. 30 tablet 11  . furosemide (LASIX) 40 MG tablet Take 1 tablet (40 mg total) by mouth daily. 90 tablet 3  . guaiFENesin (ROBITUSSIN) 100 MG/5ML SOLN Take 10 mLs by mouth every 4 (four) hours as needed for cough or to loosen phlegm.    Marland Kitchen levothyroxine (SYNTHROID, LEVOTHROID) 112 MCG tablet Take 1 tablet (112 mcg total) by mouth daily before breakfast. 30 tablet 3  . lisinopril (PRINIVIL,ZESTRIL) 5 MG tablet Take 0.5 tablets (2.5 mg total) by mouth daily. 30 tablet 3  . loratadine (CLARITIN) 10 MG tablet Take 5 mg by mouth 2 (two) times daily as needed for allergies or rhinitis.     . Magnesium 400 MG CAPS Take 400 mg by mouth 2 (two) times daily.    . metoprolol tartrate (LOPRESSOR) 25 MG tablet Take 25 mg by mouth 2 (two) times daily.    . Misc Natural Products (SAW PALMETTO) CAPS Take 1 capsule by mouth daily.    Marland Kitchen OVER THE COUNTER MEDICATION Take 1 tablet by mouth daily. muscudine seed    . potassium chloride SA (K-DUR,KLOR-CON) 20 MEQ tablet Take 80 mEq by mouth daily. TAKE 2 TABS IN THE A.M. AND TAKE 2 TABS IN THE PM TOTAL OF 80 MG    . predniSONE (DELTASONE) 10 MG tablet Take 10 mg by mouth as needed (GOUT).    . vitamin C (ASCORBIC ACID) 500 MG tablet Take 500 mg by mouth daily.      No current facility-administered medications for this visit.     Allergies  Allergen Reactions  . Penicillins Shortness Of Breath and Rash    Ended up at ER after using  . Oxycodone Other (See Comments)    Altered mental status    Review of Systems negative except from HPI and PMH  Physical Exam BP 96/60   Pulse 88   Ht 5\' 9"  (1.753 m)   Wt 229 lb 12.8 oz  (104.2 kg)   SpO2 97%   BMI 33.94 kg/m   Well developed and well nourished in no acute distress HENT normal E scleral and icterus clear Neck Supple  carotids brisk and full Clear to ausculation Regular rate and rhythm, no murmurs gallops or rub Soft with active bowel sounds  No clubbing cyanosis no Edema Alert and oriented, grossly normal motor and sensory function Skin Warm and Dry  ECG was Not ordered today was reviewed from 9/27. P synchronous pacing with PR interval of 300 ms and left bundle branch block with a QRS duration of 160 ms   Assessment and  Plan\ Sinus node dysfunction  Atrial fibrillation-paroxysmal  Cardiomyopathy-new  Left bundle branch block  First-degree AV block  HFrEF  High Risk Medication Surveillance   Obstructive sleep apnea-treated  Pacemaker-Medtronic The patient's device was interrogated.  The information was reviewed. No changes were made in the programming.     He is not volume overloaded.  I'm concerned about the cause of his LV dysfunction. I will discuss with Dr. Saundra Shelling need for catheterization is been 7 years.  In addition, we will need to see the impact of guideline directed medical therapy. Ace inhibitors have just been initiated. We will switch his metoprolol--carvedilol based on the outcomes data. He will need reassessment of LV function prior to proceeding with device upgrade to help determine whether CRT P versus CRT-D would be most appropriate.  His atrial fibrillation burden is scant.  He is tolerating his anticoagulation without difficulty. His potassium level was a little bit low on his dofetilide.  We will increase his potassium supplementation.  Given his low blood pressure for guideline directed therapy, and is infrequent nocturia, we will discontinue his doxazosin. He will resume it if urinary hesitancy or frequency occurs

## 2016-07-23 NOTE — Progress Notes (Signed)
Site area: Right brachial a 5 french brachial sheath was removed  Site Prior to Removal:  Level 0  Pressure Applied For 15 MINUTES    Bedrest Beginning at 1045am Manual:   Yes.    Patient Status During Pull:  stable  Post Pull Groin Site:  Level 0  Post Pull Instructions Given:  Yes.    Post Pull Pulses Present:  Yes.    Dressing Applied:  Yes.    Comments:  VS remain stable during sheath pull.

## 2016-07-28 ENCOUNTER — Telehealth: Payer: Self-pay | Admitting: Internal Medicine

## 2016-07-28 NOTE — Telephone Encounter (Signed)
New Message  Pt wife call requesting to speak with RN about pts pacer. Please call back to discuss

## 2016-07-29 NOTE — Telephone Encounter (Signed)
Spoke with patients wife. She stated she was calling to see if Dr. Irish Lack had spoke with Dr Caryl Comes regarding his recent cath--she wanted to know when they would be coming in to see Dr Caryl Comes. Will forward.

## 2016-07-29 NOTE — Telephone Encounter (Signed)
Will forward to Dr. Klein to review. 

## 2016-07-30 NOTE — Telephone Encounter (Signed)
I called and spoke with the patient. He is aware that per Dr. Caryl Comes, he does not qualify for an ICD at this time due to his EF 35-45% by cath. He is also aware that Dr. Caryl Comes and Dr. Irish Lack have spoken and that the plan is to continue with medical therapy at this point and have him follow up with Dr. Irish Lack in 2 months. The patient verbalizes understanding and is agreeable. He will see Dr. Irish Lack on 10/01/16.  He did also mention to me that since starting coreg, he is having joint discomfort to his bilateral lower extremities.  He states it is something that he can live with, but it is noticeable to him. I advised I was not aware of this as a side effect with coreg, but will review with Dr. Caryl Comes and call him back with any recommendations. He is agreeable.

## 2016-08-05 ENCOUNTER — Other Ambulatory Visit: Payer: Self-pay | Admitting: Internal Medicine

## 2016-08-05 NOTE — Telephone Encounter (Signed)
If he is having trouble tolerating the carvedilol, we should try him on another guideline directed beta blocker. Let's try bisoprolol 2.5 mg twice daily. I will include Dr. Saundra Shelling on this message so that further drug management can be directed under his expertise

## 2016-08-05 NOTE — Telephone Encounter (Signed)
OK to try bisoprolol.

## 2016-08-06 NOTE — Telephone Encounter (Signed)
Spoke with pt and advised him of recommendations per Dr. Caryl Comes and Dr. Irish Lack.  Pt states that he would like to hold off on switching medications for now.  He states that he read that tomatoes can cause inflammation and he has been eating a lot of tomatoes and drinking tomato juice recently.  Pt would like to back off on these two things and see if he has any improvement before switching meds.  Pt plans to call back if he has further issues.

## 2016-09-02 DIAGNOSIS — R05 Cough: Secondary | ICD-10-CM | POA: Diagnosis not present

## 2016-09-02 DIAGNOSIS — J209 Acute bronchitis, unspecified: Secondary | ICD-10-CM | POA: Diagnosis not present

## 2016-09-11 ENCOUNTER — Encounter: Payer: Self-pay | Admitting: Interventional Cardiology

## 2016-09-24 ENCOUNTER — Encounter: Payer: Self-pay | Admitting: Internal Medicine

## 2016-09-25 ENCOUNTER — Encounter: Payer: Self-pay | Admitting: Internal Medicine

## 2016-09-25 ENCOUNTER — Ambulatory Visit (INDEPENDENT_AMBULATORY_CARE_PROVIDER_SITE_OTHER)
Admission: RE | Admit: 2016-09-25 | Discharge: 2016-09-25 | Disposition: A | Discharge status: Home / Self Care | Payer: Medicare Other | Source: Ambulatory Visit | Attending: Internal Medicine | Admitting: Internal Medicine

## 2016-09-25 ENCOUNTER — Ambulatory Visit (INDEPENDENT_AMBULATORY_CARE_PROVIDER_SITE_OTHER): Payer: Medicare Other | Admitting: Internal Medicine

## 2016-09-25 VITALS — BP 120/62 | HR 84 | Ht 69.0 in | Wt 220.6 lb

## 2016-09-25 DIAGNOSIS — J209 Acute bronchitis, unspecified: Secondary | ICD-10-CM

## 2016-09-25 DIAGNOSIS — G4733 Obstructive sleep apnea (adult) (pediatric): Secondary | ICD-10-CM

## 2016-09-25 DIAGNOSIS — I482 Chronic atrial fibrillation, unspecified: Secondary | ICD-10-CM

## 2016-09-25 MED ORDER — BENZONATATE 200 MG PO CAPS: 200.0000 mg | ORAL_CAPSULE | Freq: Three times a day (TID) | ORAL | 1 refills | Status: DC | PRN

## 2016-09-25 NOTE — Progress Notes (Signed)
Patient ID: Don Jacobs, male    DOB: [birth year], 80 y.o.   MRN: [id number] M former smoker followed for OSA,hypoxia complicated by A. Fib/ pacemaker, HBP, hypothyroid, aortic aneurysm   --------------------------------------------------------  09/26/2015-80 year old male former smoker followed for hypoxia and sleep apnea, complicated by A. Fib/ pacemaker, HBP, hypothyroid CPAP 12/[facility] FOLLOWS FOR: pt. states he wears CPAP 9hr. everynight. feels pressure is good. supplies needed (mask). DL needed. DME: [facility]  09/25/2016-80 year old male former smoker followed for OSA , complicated by A. fib/pacemaker, HBP, hypothyroid CPAP 8/[facility] FOLLOWS FOR: DME: [facility]. Pt wears CPAP nightly and with naps. DL attached. Pt's CPAP is starting to mess up and was told by [facility] to have [place] [facility] order for new CPAP He depends on CPAP and tries to use it every night. Recently it is been shutting off during the night and had attempted repair by his DME company did not work. Download confirms excellent compliance 96%/4 hour, good control AHI 5.0/hour. Acute sinusitis/bronchitis syndrome with green discharge and low-grade fever was treated by his PCP with Levaquin. Discoloration cleared and now produces minimal sputum but has persistent annoying cough.   ROS-see HPI Constitutional:   No-   weight loss, night sweats, fevers, chills, +fatigue, lassitude. HEENT:   No-  headaches, difficulty swallowing, tooth/dental problems, sore throat,       No-  sneezing, itching, ear ache, nasal congestion, post nasal drip,  CV:  No-   chest pain, orthopnea, PND, swelling in lower extremities, anasarca, dizziness, palpitations Resp: No-   shortness of breath with exertion or at rest.             + productive cough,  + non-productive cough,  No- coughing up of blood.              No-   change in color of mucus.  No- wheezing.   Skin: No-   rash or lesions. GI:  No-   heartburn, indigestion, abdominal pain, nausea, vomiting,   GU:  MS:  No-   joint pain or swelling.   Neuro-     nothing unusual Psych:  No- change in mood or affect. No depression or anxiety.  No memory loss.  OBJ- Physical Exam General- Alert, Oriented, Affect-appropriate, Distress- none acute, + overweight Skin- rash-none, lesions- none, excoriation- none Lymphadenopathy- none Head- atraumatic            Eyes- Gross vision intact, PERRLA, conjunctivae and secretions clear            Ears- + Hearing aid            Nose- Clear, no-Septal dev, mucus, polyps, erosion, perforation             Throat- Mallampati III-IV , mucosa clear , drainage- none, tonsils- atrophic. dentures Neck- flexible , trachea midline, no stridor , thyroid nl, carotid no bruit Chest - symmetrical excursion , unlabored           Heart/CV- RRR/ paced, no murmur , no gallop  , no rub, nl s1 s2                           - JVD- none , edema- none, stasis changes- none, varices- none           Lung- + rhonchi right mid chest, wheeze- none, cough- none , dullness-none, rub- none           Chest wall-  +L pacemaker Abd-  Br/ Gen/ Rectal- Not done, not indicated Extrem- cyanosis- none, clubbing, none, atrophy- none, strength- nl Neuro- grossly intact to observation

## 2016-09-25 NOTE — Assessment & Plan Note (Signed)
Lingering bronchitis syndrome associated with rhonchi right mid chest, following an acute bronchitis and sinusitis treated by his report with Levaquin. This may just be a residual viral syndrome tracheobronchitis which will fade. Because of exam findings we will check chest x-ray and let him try Tessalon Perles.

## 2016-09-25 NOTE — Patient Instructions (Signed)
Order- CXR   Dx acute bronchitis   Order- DME [facility]- Please evaluate for replacement of old CPAP machine, Auto 10-15,  mask of choice, humidifier, supplies, AirView                        Dx OSA  Script sent for tessalon perles for cough if needed

## 2016-09-25 NOTE — Assessment & Plan Note (Signed)
Rhythm is regular on exam at this visit consistent with pacemaker. He is managed by cardiology with upcoming appointment pending.

## 2016-09-25 NOTE — Assessment & Plan Note (Signed)
His pressure had been at 12 but current download indicates pressure of 8 with good compliance and an AHI of 5.0. He needs replacement machine. We will change to auto titration 10-15. Discussed mask fit and alternatives to minimize leaks.

## 2016-09-29 DIAGNOSIS — R05 Cough: Secondary | ICD-10-CM | POA: Diagnosis not present

## 2016-09-30 NOTE — Progress Notes (Deleted)
Cardiology Office Note   Date:  09/30/2016   ID:  Don Jacobs, DOB 1936/04/07, MRN [id number]  PCP:  Don Screws, MD    No chief complaint on file.    Wt Readings from Last 3 Encounters:  09/25/16 220 lb 9.6 oz (100.1 kg)  07/23/16 215 lb 7 oz (97.7 kg)  07/20/16 216 lb (98 kg)       History of Present Illness: Don Jacobs is a 81 y.o. male  who has had symptomatic atrial fibrillation and tachybradycardia syndrome. He has undergone pacemaker placement. His history was complicated by a fall and head trauma including a subdural hematoma a few years ago. He has been cleared for anticoagulation due to the atrial fibrillation.  He had been in atrial fibrillation, but due to his symptoms he was referred to electrophysiology. Dofetilide has been very helpful in maintaining sinus rhythm. He feels much better. He denies any palpitations. He has not had chest pain. He has some issues with joint pains and decreased balance. Other than that, he feels well.  He had a [facility] at [facility] in 2017. Fatigue has limited his rehab.  Echo was done recently showing significantly decreased LV function.  He had a clean cath in the past in 2010.    He has had some weight gain and SHOB with exertion.  He feels fatigued.    Past Medical History:  Diagnosis Date  . Allergic rhinitis   . Aortic aneurysm, thoracic ([place]) 05/25/2011  . BPH (benign prostatic hyperplasia)   . Chronic systolic CHF (congestive heart failure) ([place])   . Dizziness and giddiness 06/04/2015  . ED (erectile dysfunction)   . Gout   . Hearing loss    uses hearing a cyst  . Hx of migraines   . Hypertension   . Hypothyroidism   . Persistent atrial fibrillation ([place])   . Prostatitis, chronic   . Seborrheic keratosis   . Sick sinus syndrome ([facility])    a. s/p PPM   . Sleep apnea   . Spondylolisthesis 07/2010    Past Surgical History:  Procedure Laterality Date  . ANTERIOR CERVICAL DECOMP/DISCECTOMY  FUSION  10/2000   "C4, 5, 6 w/fusion" (02/17/2013)  . BACK SURGERY    . CARDIAC CATHETERIZATION  2000's   "once" (02/17/2013)  . CARDIAC CATHETERIZATION N/A 07/23/2016   Procedure: Right/Left Heart Cath and Coronary Angiography;  Surgeon: Jettie Booze, MD;  Location: [place] CV LAB;  Service: Cardiovascular;  Laterality: N/A;  . CARDIOVERSION N/A 04/09/2014   Procedure: CARDIOVERSION;  Surgeon: Lelon Perla, MD;  Location: [facility] ENDOSCOPY;  Service: Cardiovascular;  Laterality: N/A;  . CARDIOVERSION N/A 01/17/2015   Procedure: CARDIOVERSION;  Surgeon: Larey Dresser, MD;  Location: [facility] ENDOSCOPY;  Service: Cardiovascular;  Laterality: N/A;  . FOOT SURGERY Right ~ 2007   "reconstruction" (02/17/2013)  . INSERT / REPLACE / REMOVE PACEMAKER  02/17/2013  . KNEE ARTHROSCOPY Left ~ 2007  . L5 selective nerve root block     Dr Nelva Bush  . LUMBAR SPINE SURGERY  2012   "bone graft, Dr. [name]" (02/17/2013)  . PERMANENT PACEMAKER INSERTION N/A 02/17/2013   Procedure: PERMANENT PACEMAKER INSERTION;  Surgeon: Deboraha Sprang, MD;  Location: [facility] CATH LAB;  Service: Cardiovascular;  Laterality: N/A;  . REPAIR PERONEAL TENDONS ANKLE Right 10/2003  . SHOULDER OPEN ROTATOR CUFF REPAIR Right 09/2007; 2013     Current Outpatient Prescriptions  Medication Sig Dispense Refill  . acetaminophen (TYLENOL) 500  MG tablet Take 500 mg by mouth every 6 (six) hours as needed (for pain.).    Marland Kitchen allopurinol (ZYLOPRIM) 300 MG tablet Take 300 mg by mouth at bedtime.     Marland Kitchen apixaban (ELIQUIS) 5 MG TABS tablet Take 1 tablet (5 mg total) by mouth 2 (two) times daily. 180 tablet 3  . benzonatate (TESSALON) 100 MG capsule Take 1 capsule (100 mg total) by mouth 3 (three) times daily as needed for cough. 90 capsule 1  . benzonatate (TESSALON) 200 MG capsule Take 1 capsule (200 mg total) by mouth 3 (three) times daily as needed for cough. 30 capsule 1  . Calcium Carbonate (CALTRATE 600 PO) Take 600 mg by mouth at bedtime.     .  carvedilol (COREG) 3.125 MG tablet TAKE 1 TABLET BY MOUTH TWICE DAILY 60 tablet 6  . Cholecalciferol (D 2000) 2000 units TABS Take 2,000 Units by mouth every evening.     . Cinnamon 500 MG capsule Take 500 mg by mouth every evening.     . colchicine 0.6 MG tablet Take 0.6 mg by mouth daily as needed (gout).     . cyanocobalamin 500 MCG tablet Take 500 mcg by mouth every evening.     . dofetilide (TIKOSYN) 250 MCG capsule Take 1 capsule (250 mcg total) by mouth 2 (two) times daily. 60 capsule 6  . furosemide (LASIX) 40 MG tablet TAKE 1 TABLET BY MOUTH DAILY. 90 tablet 1  . guaiFENesin (ROBITUSSIN) 100 MG/5ML SOLN Take 10 mLs by mouth every 4 (four) hours as needed for cough or to loosen phlegm.    Marland Kitchen levothyroxine (SYNTHROID, LEVOTHROID) 112 MCG tablet Take 1 tablet (112 mcg total) by mouth daily before breakfast. 30 tablet 3  . lisinopril (PRINIVIL,ZESTRIL) 5 MG tablet Take 0.5 tablets (2.5 mg total) by mouth daily. 30 tablet 3  . loratadine (CLARITIN) 10 MG tablet Take 5 mg by mouth 2 (two) times daily as needed for allergies or rhinitis.     . Magnesium 400 MG CAPS Take 400 mg by mouth 2 (two) times daily.    . Misc Natural Products (SAW PALMETTO) CAPS Take 1 capsule by mouth daily.    Marland Kitchen OVER THE COUNTER MEDICATION Take 1 capsule by mouth every evening. [place] Grape Seed Extract    . polyvinyl alcohol (LIQUIFILM TEARS) 1.4 % ophthalmic solution Place 1 drop into both eyes 3 (three) times daily as needed for dry eyes.    . potassium chloride SA (K-DUR,KLOR-CON) 20 MEQ tablet Take 40 mEq by mouth 2 (two) times daily.     . predniSONE (DELTASONE) 10 MG tablet Take 10 mg by mouth daily as needed (GOUT).     . vitamin C (ASCORBIC ACID) 500 MG tablet Take 500 mg by mouth daily.      No current facility-administered medications for this visit.     Allergies:   Penicillins and Oxycodone    Social History:  The patient  reports that he quit smoking about 35 years ago. His smoking use included  Cigarettes. He has a 12.00 pack-year smoking history. He has never used smokeless tobacco. He reports that he does not drink alcohol or use drugs.   Family History:  The patient's ***family history includes Alcohol abuse in his maternal grandfather and maternal grandmother; Emphysema in his mother; Heart attack in his father and paternal grandfather; Heart disease in his father and paternal grandfather; Rheum arthritis in his brother.    ROS:  Please see the history of  present illness.   Otherwise, review of systems are positive for ***.   All other systems are reviewed and negative.    PHYSICAL EXAM: VS:  There were no vitals taken for this visit. , BMI There is no height or weight on file to calculate BMI. GEN: Well nourished, well developed, in no acute distress  HEENT: normal  Neck: no JVD, carotid bruits, or masses Cardiac: ***RRR; no murmurs, rubs, or gallops,no edema  Respiratory:  clear to auscultation bilaterally, normal work of breathing GI: soft, nontender, nondistended, + BS MS: no deformity or atrophy  Skin: warm and dry, no rash Neuro:  Strength and sensation are intact Psych: euthymic mood, full affect   EKG:   The ekg ordered today demonstrates ***   Recent Labs: 12/09/2015: TSH 2.238 01/29/2016: ALT 12 06/15/2016: Magnesium 1.9 06/24/2016: Brain Natriuretic Peptide 30.9 07/15/2016: BUN 17; Creat 1.32; Hemoglobin 11.9; Platelets 143; Potassium 4.4; Sodium 139   Lipid Panel No results found for: CHOL, TRIG, HDL, CHOLHDL, VLDL, LDLCALC, LDLDIRECT   Other studies Reviewed: Additional studies/ records that were reviewed today with results demonstrating: ***.   ASSESSMENT AND PLAN:  1. Abnormal echo:  No CAD by cath in 2107.  Normal right heart pressures.  EF seemed higher by Vgram.  Septal motion abnormal.   2. AFib: 3. SHOB:   Current medicines are reviewed at length with the patient today.  The patient concerns regarding his medicines were addressed.  The  following changes have been made:  No change***  Labs/ tests ordered today include: *** No orders of the defined types were placed in this encounter.   Recommend 150 minutes/week of aerobic exercise Low fat, low carb, high fiber diet recommended  Disposition:   FU in ***   Signed, Larae Grooms, MD  09/30/2016 [facility] [place], [place], [place]  [address] Phone: [phone]; Fax: [phone]

## 2016-10-01 ENCOUNTER — Ambulatory Visit: Payer: Medicare Other | Admitting: Interventional Cardiology

## 2016-10-06 ENCOUNTER — Ambulatory Visit (INDEPENDENT_AMBULATORY_CARE_PROVIDER_SITE_OTHER): Payer: Medicare Other | Admitting: *Deleted

## 2016-10-06 ENCOUNTER — Telehealth: Payer: Self-pay | Admitting: Cardiology

## 2016-10-06 DIAGNOSIS — I495 Sick sinus syndrome: Secondary | ICD-10-CM | POA: Diagnosis not present

## 2016-10-06 NOTE — Telephone Encounter (Signed)
LMOVM reminding pt to send remote transmission.   

## 2016-10-07 ENCOUNTER — Encounter: Payer: Self-pay | Admitting: Cardiology

## 2016-10-07 NOTE — Progress Notes (Signed)
Remote pacemaker transmission.   

## 2016-10-08 LAB — CUP PACEART REMOTE DEVICE CHECK
Battery Remaining Longevity: 67 mo
Brady Statistic AP VP Percent: 0.09 %
Brady Statistic AP VS Percent: 88.95 %
Brady Statistic RV Percent Paced: 0.13 %
Date Time Interrogation Session: 20180109204825
Implantable Lead Location: 753859
Implantable Lead Model: 5076
Lead Channel Impedance Value: 437 Ohm
Lead Channel Impedance Value: 456 Ohm
Lead Channel Impedance Value: 494 Ohm
Lead Channel Sensing Intrinsic Amplitude: 1.375 mV
Lead Channel Sensing Intrinsic Amplitude: 8.875 mV
Lead Channel Sensing Intrinsic Amplitude: 8.875 mV
Lead Channel Setting Pacing Amplitude: 2 V
Lead Channel Setting Pacing Amplitude: 2.5 V
Lead Channel Setting Pacing Pulse Width: 0.4 ms
Lead Channel Setting Sensing Sensitivity: 4 mV
MDC IDC LEAD IMPLANT DT: 20140523
MDC IDC LEAD IMPLANT DT: 20140523
MDC IDC LEAD LOCATION: 753860
MDC IDC MSMT BATTERY VOLTAGE: 3 V
MDC IDC MSMT LEADCHNL RA IMPEDANCE VALUE: 342 Ohm
MDC IDC MSMT LEADCHNL RA PACING THRESHOLD AMPLITUDE: 0.75 V
MDC IDC MSMT LEADCHNL RA PACING THRESHOLD PULSEWIDTH: 0.4 ms
MDC IDC MSMT LEADCHNL RA SENSING INTR AMPL: 1.375 mV
MDC IDC MSMT LEADCHNL RV PACING THRESHOLD AMPLITUDE: 0.75 V
MDC IDC MSMT LEADCHNL RV PACING THRESHOLD PULSEWIDTH: 0.4 ms
MDC IDC PG IMPLANT DT: 20140523
MDC IDC STAT BRADY AS VP PERCENT: 0.02 %
MDC IDC STAT BRADY AS VS PERCENT: 10.94 %
MDC IDC STAT BRADY RA PERCENT PACED: 88.71 %

## 2016-10-21 DIAGNOSIS — Z0000 Encounter for general adult medical examination without abnormal findings: Secondary | ICD-10-CM | POA: Diagnosis not present

## 2016-10-21 DIAGNOSIS — D696 Thrombocytopenia, unspecified: Secondary | ICD-10-CM | POA: Diagnosis not present

## 2016-10-21 DIAGNOSIS — I10 Essential (primary) hypertension: Secondary | ICD-10-CM | POA: Diagnosis not present

## 2016-10-21 DIAGNOSIS — M545 Low back pain: Secondary | ICD-10-CM | POA: Diagnosis not present

## 2016-10-21 DIAGNOSIS — Z79899 Other long term (current) drug therapy: Secondary | ICD-10-CM | POA: Diagnosis not present

## 2016-10-21 DIAGNOSIS — I719 Aortic aneurysm of unspecified site, without rupture: Secondary | ICD-10-CM | POA: Diagnosis not present

## 2016-10-21 DIAGNOSIS — G4730 Sleep apnea, unspecified: Secondary | ICD-10-CM | POA: Diagnosis not present

## 2016-10-21 DIAGNOSIS — M109 Gout, unspecified: Secondary | ICD-10-CM | POA: Diagnosis not present

## 2016-10-21 DIAGNOSIS — I4891 Unspecified atrial fibrillation: Secondary | ICD-10-CM | POA: Diagnosis not present

## 2016-10-21 DIAGNOSIS — Z1389 Encounter for screening for other disorder: Secondary | ICD-10-CM | POA: Diagnosis not present

## 2016-10-21 DIAGNOSIS — Z950 Presence of cardiac pacemaker: Secondary | ICD-10-CM | POA: Diagnosis not present

## 2016-10-21 DIAGNOSIS — N400 Enlarged prostate without lower urinary tract symptoms: Secondary | ICD-10-CM | POA: Diagnosis not present

## 2016-10-21 DIAGNOSIS — K5730 Diverticulosis of large intestine without perforation or abscess without bleeding: Secondary | ICD-10-CM | POA: Diagnosis not present

## 2016-10-21 DIAGNOSIS — D81818 Other biotin-dependent carboxylase deficiency: Secondary | ICD-10-CM | POA: Diagnosis not present

## 2016-10-21 DIAGNOSIS — N529 Male erectile dysfunction, unspecified: Secondary | ICD-10-CM | POA: Diagnosis not present

## 2016-10-21 DIAGNOSIS — J309 Allergic rhinitis, unspecified: Secondary | ICD-10-CM | POA: Diagnosis not present

## 2016-10-22 ENCOUNTER — Encounter: Payer: Self-pay | Admitting: Interventional Cardiology

## 2016-10-22 ENCOUNTER — Ambulatory Visit (INDEPENDENT_AMBULATORY_CARE_PROVIDER_SITE_OTHER): Payer: Medicare Other | Admitting: Interventional Cardiology

## 2016-10-22 VITALS — BP 120/86 | HR 84 | Ht 69.0 in | Wt 221.6 lb

## 2016-10-22 DIAGNOSIS — I5022 Chronic systolic (congestive) heart failure: Secondary | ICD-10-CM

## 2016-10-22 DIAGNOSIS — I4891 Unspecified atrial fibrillation: Secondary | ICD-10-CM

## 2016-10-22 DIAGNOSIS — Z7901 Long term (current) use of anticoagulants: Secondary | ICD-10-CM

## 2016-10-22 DIAGNOSIS — I428 Other cardiomyopathies: Secondary | ICD-10-CM | POA: Diagnosis not present

## 2016-10-22 NOTE — Progress Notes (Signed)
Cardiology Office Note   Date:  10/22/2016   ID:  RICHERD Jacobs, DOB [birth year], MRN [id number]  PCP:  Henrine Screws, MD    No chief complaint on file. f/u [facility]   Wt Readings from Last 3 Encounters:  10/22/16 221 lb 9.6 oz (100.5 kg)  09/25/16 220 lb 9.6 oz (100.1 kg)  07/23/16 215 lb 7 oz (97.7 kg)       History of Present Illness: Don Jacobs is a 81 y.o. male  who has had symptomatic atrial fibrillation and tachybradycardia syndrome. He has undergone pacemaker placement. His history was complicated by a fall and head trauma including a subdural hematoma a few years ago. He has been cleared for anticoagulation due to the atrial fibrillation.  He had been in atrial fibrillation, but due to his symptoms he was referred to electrophysiology. Dofetilide has been very helpful in maintaining sinus rhythm.   He had some shortness of breath a few months ago. There was concern as to whether he needed his pacemaker upgraded to a biventricular device. He had a clean heart cath in his ejection fraction was better by ventriculogram than it was by echocardiogram. Currently, he feels much better. He denies any palpitations. He has not had chest pain.  Overall, he feels very well. He swims several times a week. He walks as well with his wife. 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    Past Medical History:  Diagnosis Date  . Allergic rhinitis   . Aortic aneurysm, thoracic ([place]) 05/25/2011  . BPH (benign prostatic hyperplasia)   . Chronic systolic CHF (congestive heart failure) ([place])   . Dizziness and giddiness 06/04/2015  . ED (erectile dysfunction)   . Gout   . Hearing loss    uses hearing a cyst  . Hx of migraines   . Hypertension   . Hypothyroidism   . Persistent atrial fibrillation ([place])   . Prostatitis, chronic   . Seborrheic keratosis   . Sick sinus syndrome (HCC)    a. s/p PPM   . Sleep apnea   . Spondylolisthesis 07/2010    Past Surgical History:  Procedure  Laterality Date  . ANTERIOR CERVICAL DECOMP/DISCECTOMY FUSION  10/2000   "C4, 5, 6 w/fusion" (02/17/2013)  . BACK SURGERY    . CARDIAC CATHETERIZATION  2000's   "once" (02/17/2013)  . CARDIAC CATHETERIZATION N/A 07/23/2016   Procedure: Right/Left Heart Cath and Coronary Angiography;  Surgeon: Jettie Booze, MD;  Location: [place] CV LAB;  Service: Cardiovascular;  Laterality: N/A;  . CARDIOVERSION N/A 04/09/2014   Procedure: CARDIOVERSION;  Surgeon: Lelon Perla, MD;  Location: [facility] ENDOSCOPY;  Service: Cardiovascular;  Laterality: N/A;  . CARDIOVERSION N/A 01/17/2015   Procedure: CARDIOVERSION;  Surgeon: Larey Dresser, MD;  Location: [facility] ENDOSCOPY;  Service: Cardiovascular;  Laterality: N/A;  . FOOT SURGERY Right ~ 2007   "reconstruction" (02/17/2013)  . INSERT / REPLACE / REMOVE PACEMAKER  02/17/2013  . KNEE ARTHROSCOPY Left ~ 2007  . L5 selective nerve root block     Dr Nelva Bush  . LUMBAR SPINE SURGERY  2012   "bone graft, Dr. [name]" (02/17/2013)  . PERMANENT PACEMAKER INSERTION N/A 02/17/2013   Procedure: PERMANENT PACEMAKER INSERTION;  Surgeon: Deboraha Sprang, MD;  Location: [facility] CATH LAB;  Service: Cardiovascular;  Laterality: N/A;  . REPAIR PERONEAL TENDONS ANKLE Right 10/2003  . SHOULDER OPEN ROTATOR CUFF REPAIR Right 09/2007; 2013     Current Outpatient Prescriptions  Medication Sig Dispense  Refill  . acetaminophen (TYLENOL) 500 MG tablet Take 500 mg by mouth every 6 (six) hours as needed (for pain.).    Marland Kitchen allopurinol (ZYLOPRIM) 300 MG tablet Take 300 mg by mouth at bedtime.     Marland Kitchen apixaban (ELIQUIS) 5 MG TABS tablet Take 1 tablet (5 mg total) by mouth 2 (two) times daily. 180 tablet 3  . benzonatate (TESSALON) 100 MG capsule Take 1 capsule (100 mg total) by mouth 3 (three) times daily as needed for cough. 90 capsule 1  . benzonatate (TESSALON) 200 MG capsule Take 1 capsule (200 mg total) by mouth 3 (three) times daily as needed for cough. 30 capsule 1  . Calcium Carbonate  (CALTRATE 600 PO) Take 600 mg by mouth at bedtime.     . carvedilol (COREG) 3.125 MG tablet TAKE 1 TABLET BY MOUTH TWICE DAILY 60 tablet 6  . Cholecalciferol (D 2000) 2000 units TABS Take 2,000 Units by mouth every evening.     . Cinnamon 500 MG capsule Take 500 mg by mouth every evening.     . colchicine 0.6 MG tablet Take 0.6 mg by mouth daily as needed (gout).     . cyanocobalamin 500 MCG tablet Take 500 mcg by mouth every evening.     . dofetilide (TIKOSYN) 250 MCG capsule Take 1 capsule (250 mcg total) by mouth 2 (two) times daily. 60 capsule 6  . furosemide (LASIX) 40 MG tablet TAKE 1 TABLET BY MOUTH DAILY. 90 tablet 1  . guaiFENesin (ROBITUSSIN) 100 MG/5ML SOLN Take 10 mLs by mouth every 4 (four) hours as needed for cough or to loosen phlegm.    Marland Kitchen levothyroxine (SYNTHROID, LEVOTHROID) 112 MCG tablet Take 1 tablet (112 mcg total) by mouth daily before breakfast. 30 tablet 3  . lisinopril (PRINIVIL,ZESTRIL) 5 MG tablet Take 0.5 tablets (2.5 mg total) by mouth daily. 30 tablet 3  . loratadine (CLARITIN) 10 MG tablet Take 5 mg by mouth 2 (two) times daily as needed for allergies or rhinitis.     . Magnesium 400 MG CAPS Take 400 mg by mouth 2 (two) times daily.    . Misc Natural Products (SAW PALMETTO) CAPS Take 1 capsule by mouth daily.    Marland Kitchen OVER THE COUNTER MEDICATION Take 1 capsule by mouth every evening. [place] Grape Seed Extract    . polyvinyl alcohol (LIQUIFILM TEARS) 1.4 % ophthalmic solution Place 1 drop into both eyes 3 (three) times daily as needed for dry eyes.    . potassium chloride SA (K-DUR,KLOR-CON) 20 MEQ tablet Take 40 mEq by mouth 2 (two) times daily.     . predniSONE (DELTASONE) 10 MG tablet Take 10 mg by mouth daily as needed (GOUT).     . vitamin C (ASCORBIC ACID) 500 MG tablet Take 500 mg by mouth daily.      No current facility-administered medications for this visit.     Allergies:   Penicillins and Oxycodone    Social History:  The patient  reports that he  quit smoking about 35 years ago. His smoking use included Cigarettes. He has a 12.00 pack-year smoking history. He has never used smokeless tobacco. He reports that he does not drink alcohol or use drugs.   Family History:  The patient's family history includes Alcohol abuse in his maternal grandfather and maternal grandmother; Emphysema in his mother; Heart attack in his father and paternal grandfather; Heart disease in his father and paternal grandfather; Rheum arthritis in his brother.    ROS:  Please see the history of present illness.   Otherwise, review of systems are positive for improvement in [facility].   All other systems are reviewed and negative.    PHYSICAL EXAM: VS:  BP 120/86   Pulse 84   Ht 5\' 9"  (1.753 m)   Wt 221 lb 9.6 oz (100.5 kg)   SpO2 98%   BMI 32.72 kg/m  , BMI Body mass index is 32.72 kg/m. GEN: Well nourished, well developed, in no acute distress  HEENT: normal  Neck: no JVD, carotid bruits, or masses Cardiac: RRR; no murmurs, rubs, or gallops,; tr pretibial edema  Respiratory:  clear to auscultation bilaterally, normal work of breathing GI: soft, nontender, nondistended, + BS MS: no deformity or atrophy  Skin: warm and dry, no rash Neuro:  Strength and sensation are intact Psych: euthymic mood, full affect    Recent Labs: 12/09/2015: TSH 2.238 01/29/2016: ALT 12 06/15/2016: Magnesium 1.9 06/24/2016: Brain Natriuretic Peptide 30.9 07/15/2016: BUN 17; Creat 1.32; Hemoglobin 11.9; Platelets 143; Potassium 4.4; Sodium 139   Lipid Panel No results found for: CHOL, TRIG, HDL, CHOLHDL, VLDL, LDLCALC, LDLDIRECT   Other studies Reviewed: Additional studies/ records that were reviewed today with results demonstrating: cath results below.   ASSESSMENT AND PLAN:  1. Chronic systolic heart failure: Appears euvolemic. Continue aggressive medical therapy. Known left bundle branch block as well. No symptoms of shortness of breath. He is NYHA class I. No indication for  upgrade to a biventricular pacemaker.  2. Atrial fibrillation: Continue [place]'s for stroke prevention. No bleeding problems.  Tikosyn for NSR. Pacer f/u with EP. 3. Cardiac cath showed no significant coronary artery disease in late 2017. 4. Aortic aneurysm: follows with Dr. Cyndia Bent. 5. Get labs from Dr. Inda Merlin.   Current medicines are reviewed at length with the patient today.  The patient concerns regarding his medicines were addressed.  The following changes have been made:  No change  Labs/ tests ordered today include:  No orders of the defined types were placed in this encounter.   Recommend 150 minutes/week of aerobic exercise Low fat, low carb, high fiber diet recommended  Disposition:   FU in 1 year   Signed, Larae Grooms, MD  10/22/2016 2:22 PM    [facility] [address], [place], [place]  [address] Phone: [phone]; Fax: [phone]

## 2016-10-22 NOTE — Patient Instructions (Signed)
**Note De-identified Curtistine Pettitt Obfuscation** Medication Instructions:  Same-no changes  Labwork: None  Testing/Procedures: None  Follow-Up: Your physician wants you to follow-up in: 1 year. You will receive a reminder letter in the mail two months in advance. If you don't receive a letter, please call our office to schedule the follow-up appointment.      If you need a refill on your cardiac medications before your next appointment, please call your pharmacy.   

## 2016-10-30 ENCOUNTER — Ambulatory Visit: Payer: Medicare Other | Admitting: Interventional Cardiology

## 2016-11-19 ENCOUNTER — Telehealth: Payer: Self-pay | Admitting: Internal Medicine

## 2016-11-19 NOTE — Telephone Encounter (Signed)
lmtcb x1 for pt. 

## 2016-11-19 NOTE — Telephone Encounter (Signed)
He can adjust his own ramp time. Directions will be in the instruction book for his machine, or he can call his DME for directions over the phone.

## 2016-11-19 NOTE — Telephone Encounter (Signed)
Called and spoke with pt and he stated that he finally got his cpap mask and he stated that the pressure is set on 8 to 10 and he stated that the ramp is out to 15.  He stated that now he is unable to get the mask to seal and he is constantly having a dry mouth.  He wanted to see if CY would be willing to reduce the ramp level to help stop this.  Please advise. thanks

## 2016-11-19 NOTE — Telephone Encounter (Signed)
Called and spoke with pt and he is aware of CY recs.  He will try to adjust the ramp on his own, if not he will contact [facility] for assistance.

## 2016-11-19 NOTE — Telephone Encounter (Signed)
Patient returned call, CB is [phone].

## 2016-12-28 ENCOUNTER — Telehealth: Payer: Self-pay | Admitting: Interventional Cardiology

## 2016-12-28 NOTE — Telephone Encounter (Signed)
New message     Please call pt is on Tikosyn and they need a prescription mailed to them so that they will refill it

## 2016-12-28 NOTE — Telephone Encounter (Signed)
Pt's wife states pt requesting written prescription for Tikosyn 250 mcg for 30 days with refills, it looks like Roderic Palau and Dr Caryl Comes had provided refills in the past, requesting prescription from Dr Irish Lack now. Pt's wife advised I will forward to Dr Irish Lack for review and to follow up with pt in the morning to let her know if Dr Irish Lack will provide prescription.

## 2016-12-28 NOTE — Telephone Encounter (Signed)
I think Tikosyn should come from the AFib MDs, since they are following this.

## 2016-12-28 NOTE — Telephone Encounter (Signed)
I will forward to Dr Caryl Comes.

## 2016-12-29 MED ORDER — DOFETILIDE 250 MCG PO CAPS: 250.0000 ug | ORAL_CAPSULE | Freq: Two times a day (BID) | ORAL | 1 refills | Status: DC

## 2016-12-29 NOTE — Telephone Encounter (Signed)
I spoke with Mrs. Meda Coffee. I advised her Dr. Caryl Comes will refill the patient's Tikosyn, but I wanted to make sure he had enough for the prescription to be mailed to him (he gets this through the [place]). I stated that since our mail is slow, they may not receive this until Friday or maybe even Monday. Per Mrs. Meda Coffee, she thought the patient had enough. She would like this mailed to:  [place], [place] [id number]

## 2016-12-29 NOTE — Telephone Encounter (Signed)
Follow Up:      Returning your call from today. 

## 2016-12-29 NOTE — Telephone Encounter (Signed)
I left a message for the patient to call. 

## 2016-12-29 NOTE — Telephone Encounter (Signed)
Thanks we should refill   No problem

## 2017-01-05 ENCOUNTER — Ambulatory Visit (INDEPENDENT_AMBULATORY_CARE_PROVIDER_SITE_OTHER): Payer: Medicare Other | Admitting: *Deleted

## 2017-01-05 ENCOUNTER — Telehealth: Payer: Self-pay | Admitting: Cardiology

## 2017-01-05 DIAGNOSIS — I495 Sick sinus syndrome: Secondary | ICD-10-CM

## 2017-01-05 NOTE — Telephone Encounter (Signed)
LMOVM reminding pt to send remote transmission.   

## 2017-01-05 NOTE — Progress Notes (Signed)
Remote pacemaker transmission.   

## 2017-01-06 ENCOUNTER — Encounter: Payer: Self-pay | Admitting: Cardiology

## 2017-01-08 LAB — CUP PACEART REMOTE DEVICE CHECK
Brady Statistic AP VS Percent: 88.13 %
Brady Statistic AS VP Percent: 0.01 %
Brady Statistic RA Percent Paced: 88.03 %
Brady Statistic RV Percent Paced: 0.11 %
Date Time Interrogation Session: 20180410133441
Implantable Lead Implant Date: 20140523
Implantable Lead Location: 753859
Implantable Lead Location: 753860
Implantable Lead Model: 5076
Lead Channel Impedance Value: 361 Ohm
Lead Channel Impedance Value: 475 Ohm
Lead Channel Impedance Value: 513 Ohm
Lead Channel Pacing Threshold Pulse Width: 0.4 ms
Lead Channel Sensing Intrinsic Amplitude: 8.75 mV
Lead Channel Sensing Intrinsic Amplitude: 8.75 mV
Lead Channel Setting Pacing Amplitude: 2.5 V
MDC IDC LEAD IMPLANT DT: 20140523
MDC IDC MSMT BATTERY REMAINING LONGEVITY: 67 mo
MDC IDC MSMT BATTERY VOLTAGE: 3 V
MDC IDC MSMT LEADCHNL RA PACING THRESHOLD AMPLITUDE: 0.625 V
MDC IDC MSMT LEADCHNL RA SENSING INTR AMPL: 1.75 mV
MDC IDC MSMT LEADCHNL RA SENSING INTR AMPL: 1.75 mV
MDC IDC MSMT LEADCHNL RV IMPEDANCE VALUE: 456 Ohm
MDC IDC MSMT LEADCHNL RV PACING THRESHOLD AMPLITUDE: 0.75 V
MDC IDC MSMT LEADCHNL RV PACING THRESHOLD PULSEWIDTH: 0.4 ms
MDC IDC PG IMPLANT DT: 20140523
MDC IDC SET LEADCHNL RA PACING AMPLITUDE: 2 V
MDC IDC SET LEADCHNL RV PACING PULSEWIDTH: 0.4 ms
MDC IDC SET LEADCHNL RV SENSING SENSITIVITY: 4 mV
MDC IDC STAT BRADY AP VP PERCENT: 0.08 %
MDC IDC STAT BRADY AS VS PERCENT: 11.78 %

## 2017-02-01 IMAGING — CR DG CHEST 1V PORT
1 series · 1 of 1 positions shown · non-contrast
Comparison: CT of the chest July 18, 2014

CLINICAL DATA: Worsening shortness of breath for 8 days. History of
hypertension, pacemaker.

EXAM:
PORTABLE CHEST - 1 VIEW

[AP]
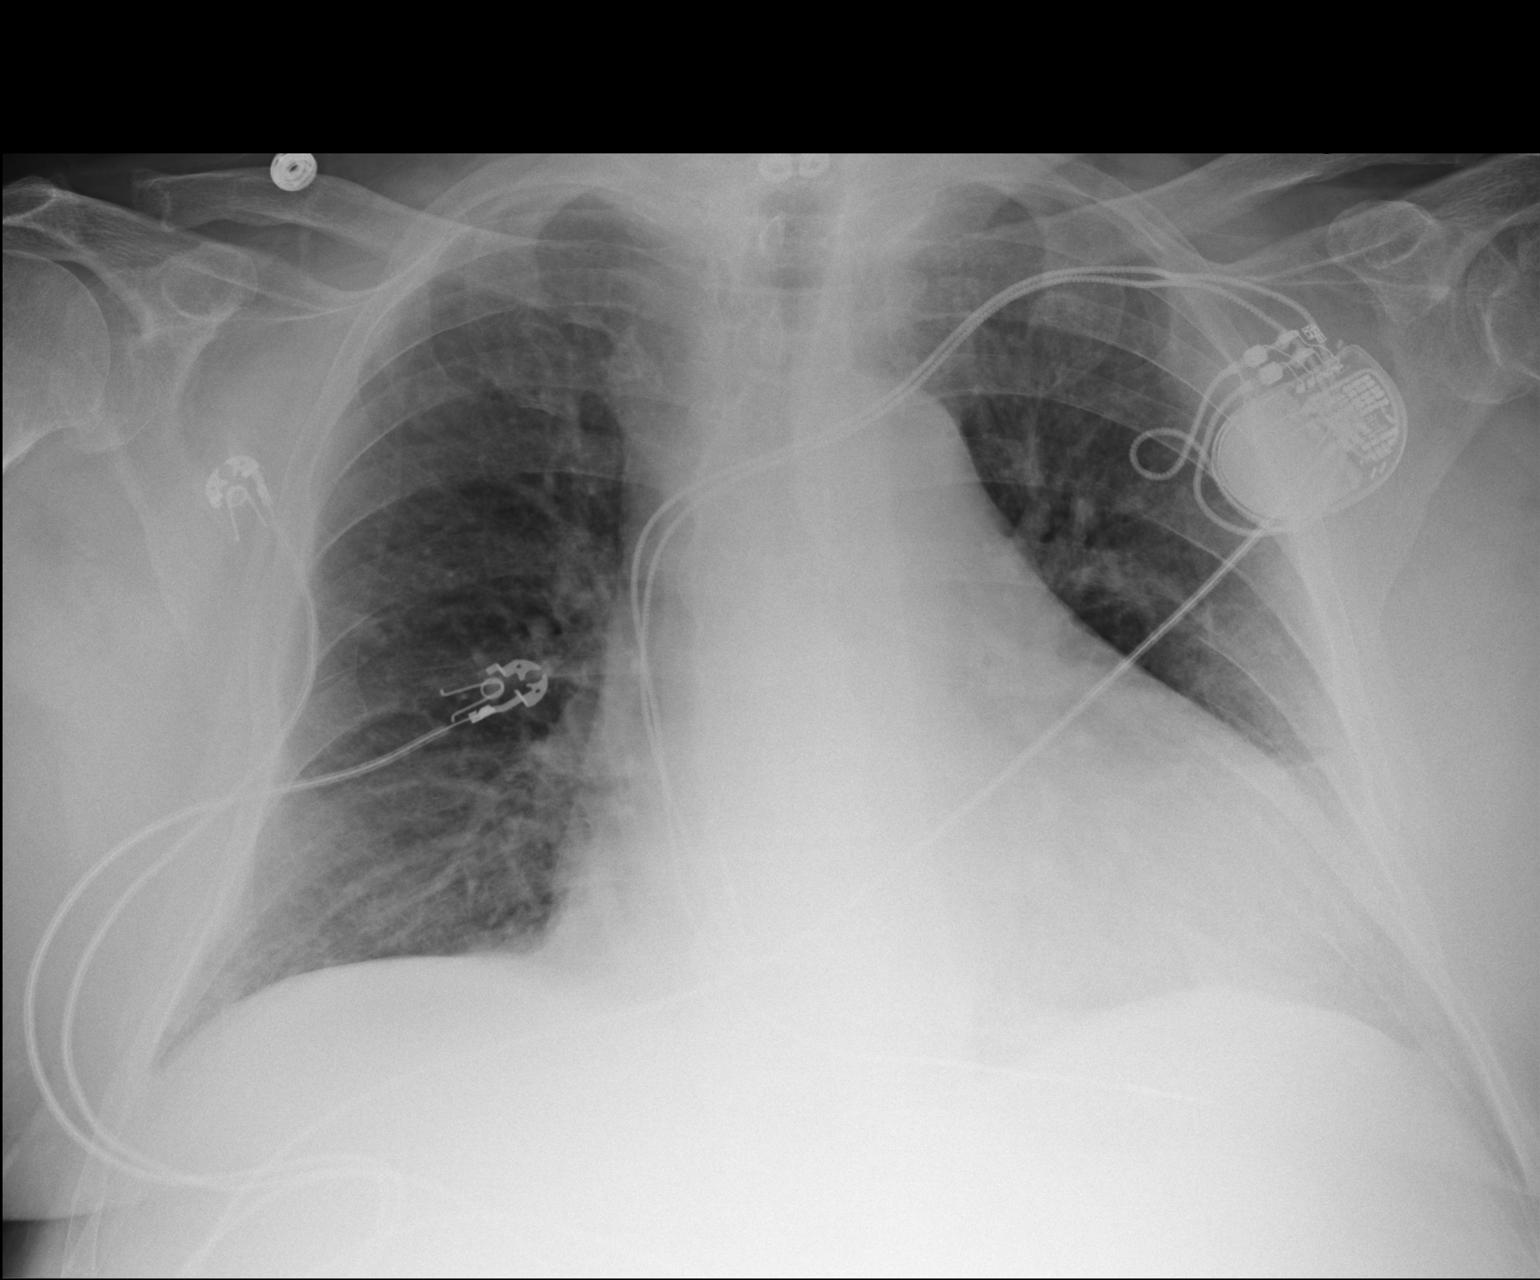

[1 of 1 positions shown; findings below may reference images not displayed]

FINDINGS: The cardiac silhouette appears moderately enlarged. Mediastinal
silhouette is nonsuspicious. Mild pulmonary vascular congestion.
Dual lead LEFT cardiac pacemaker in situ. No pleural effusions or
focal consolidation. No pneumothorax. ACDF. Resected distal RIGHT
clavicle. Soft tissue planes are nonsuspicious.
IMPRESSION: Cardiomegaly and mild pulmonary vascular congestion.

  By: Chi Meng Keai

## 2017-02-03 IMAGING — DX DG CHEST 2V
2 series · 2 of 2 positions shown · non-contrast
Comparison: 01/03/2015

CLINICAL DATA: Dry cough for 8 days, congestive heart failure

EXAM:
CHEST  2 VIEW

[chest pa]
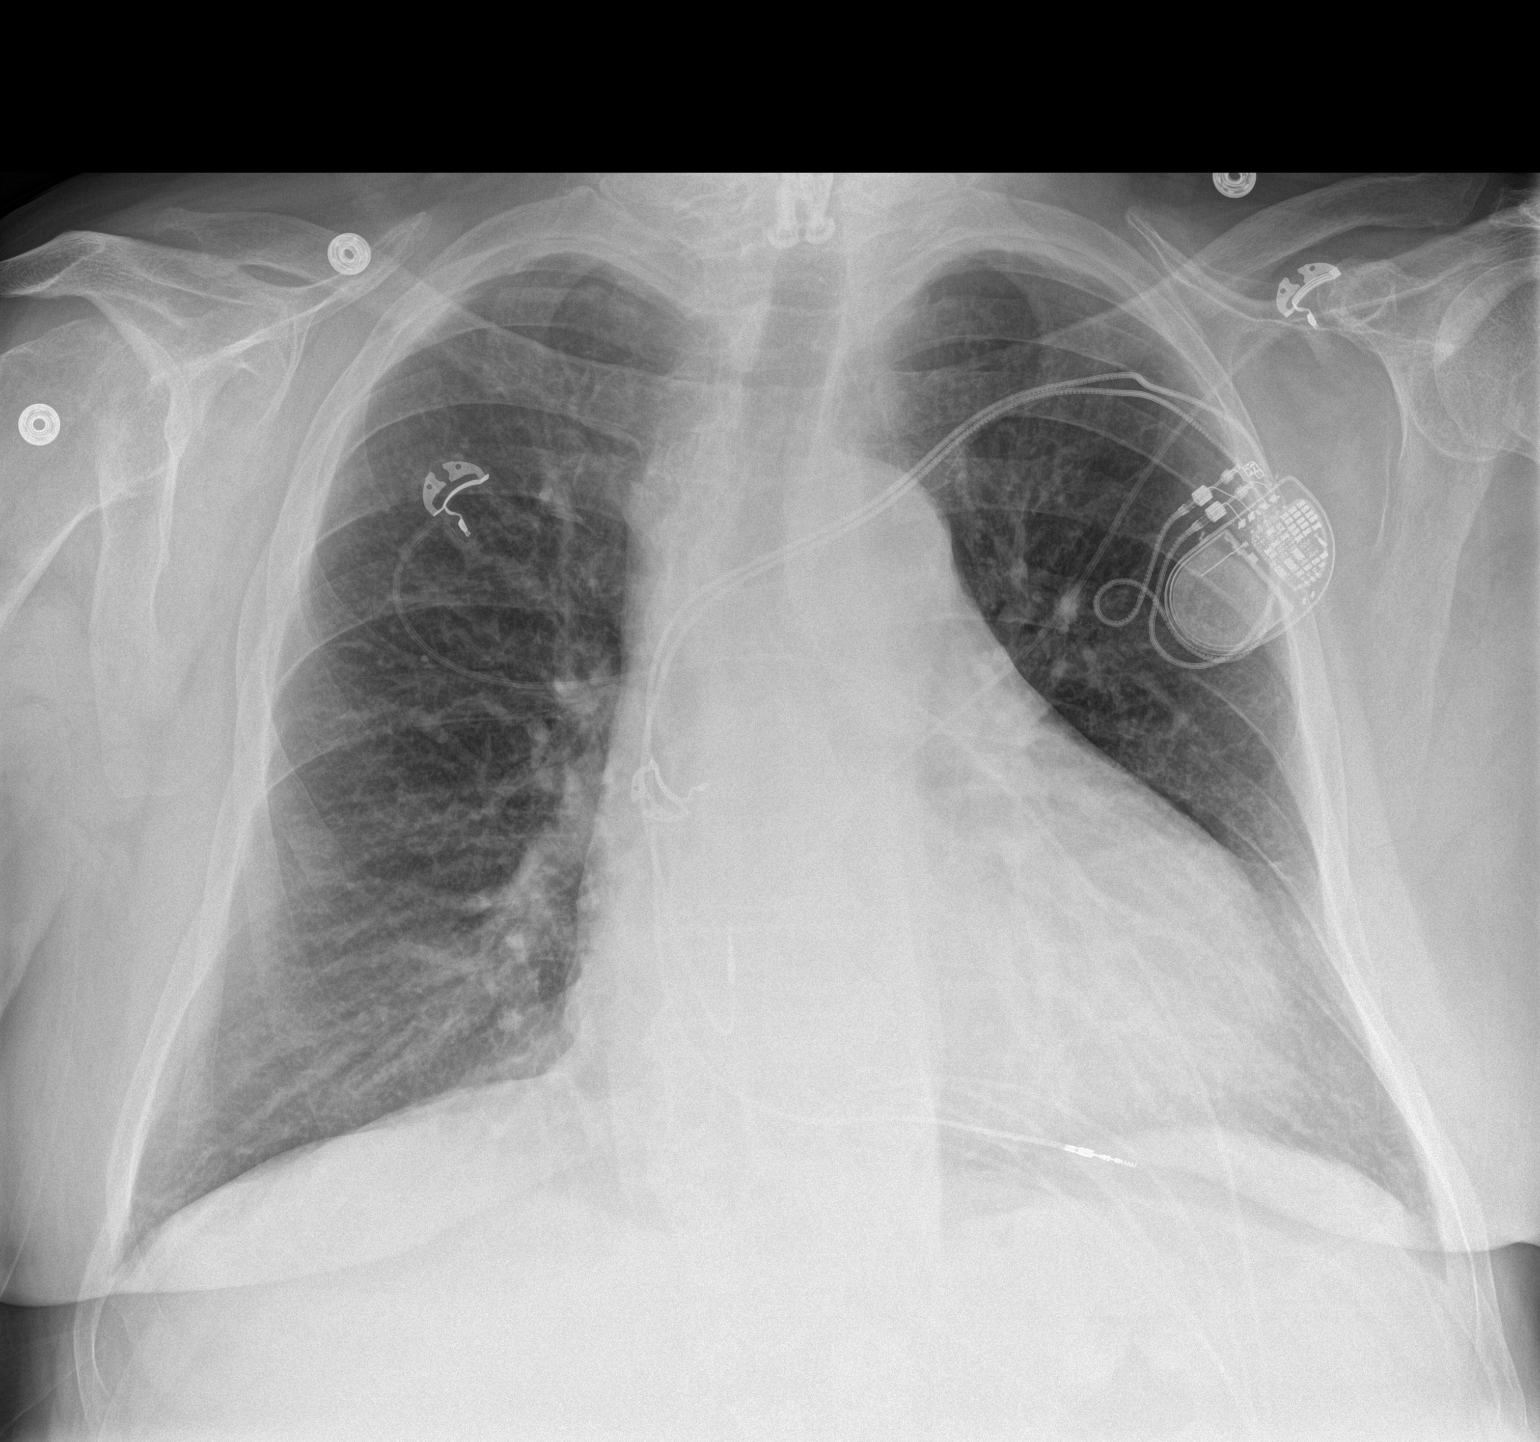

[chest lat]
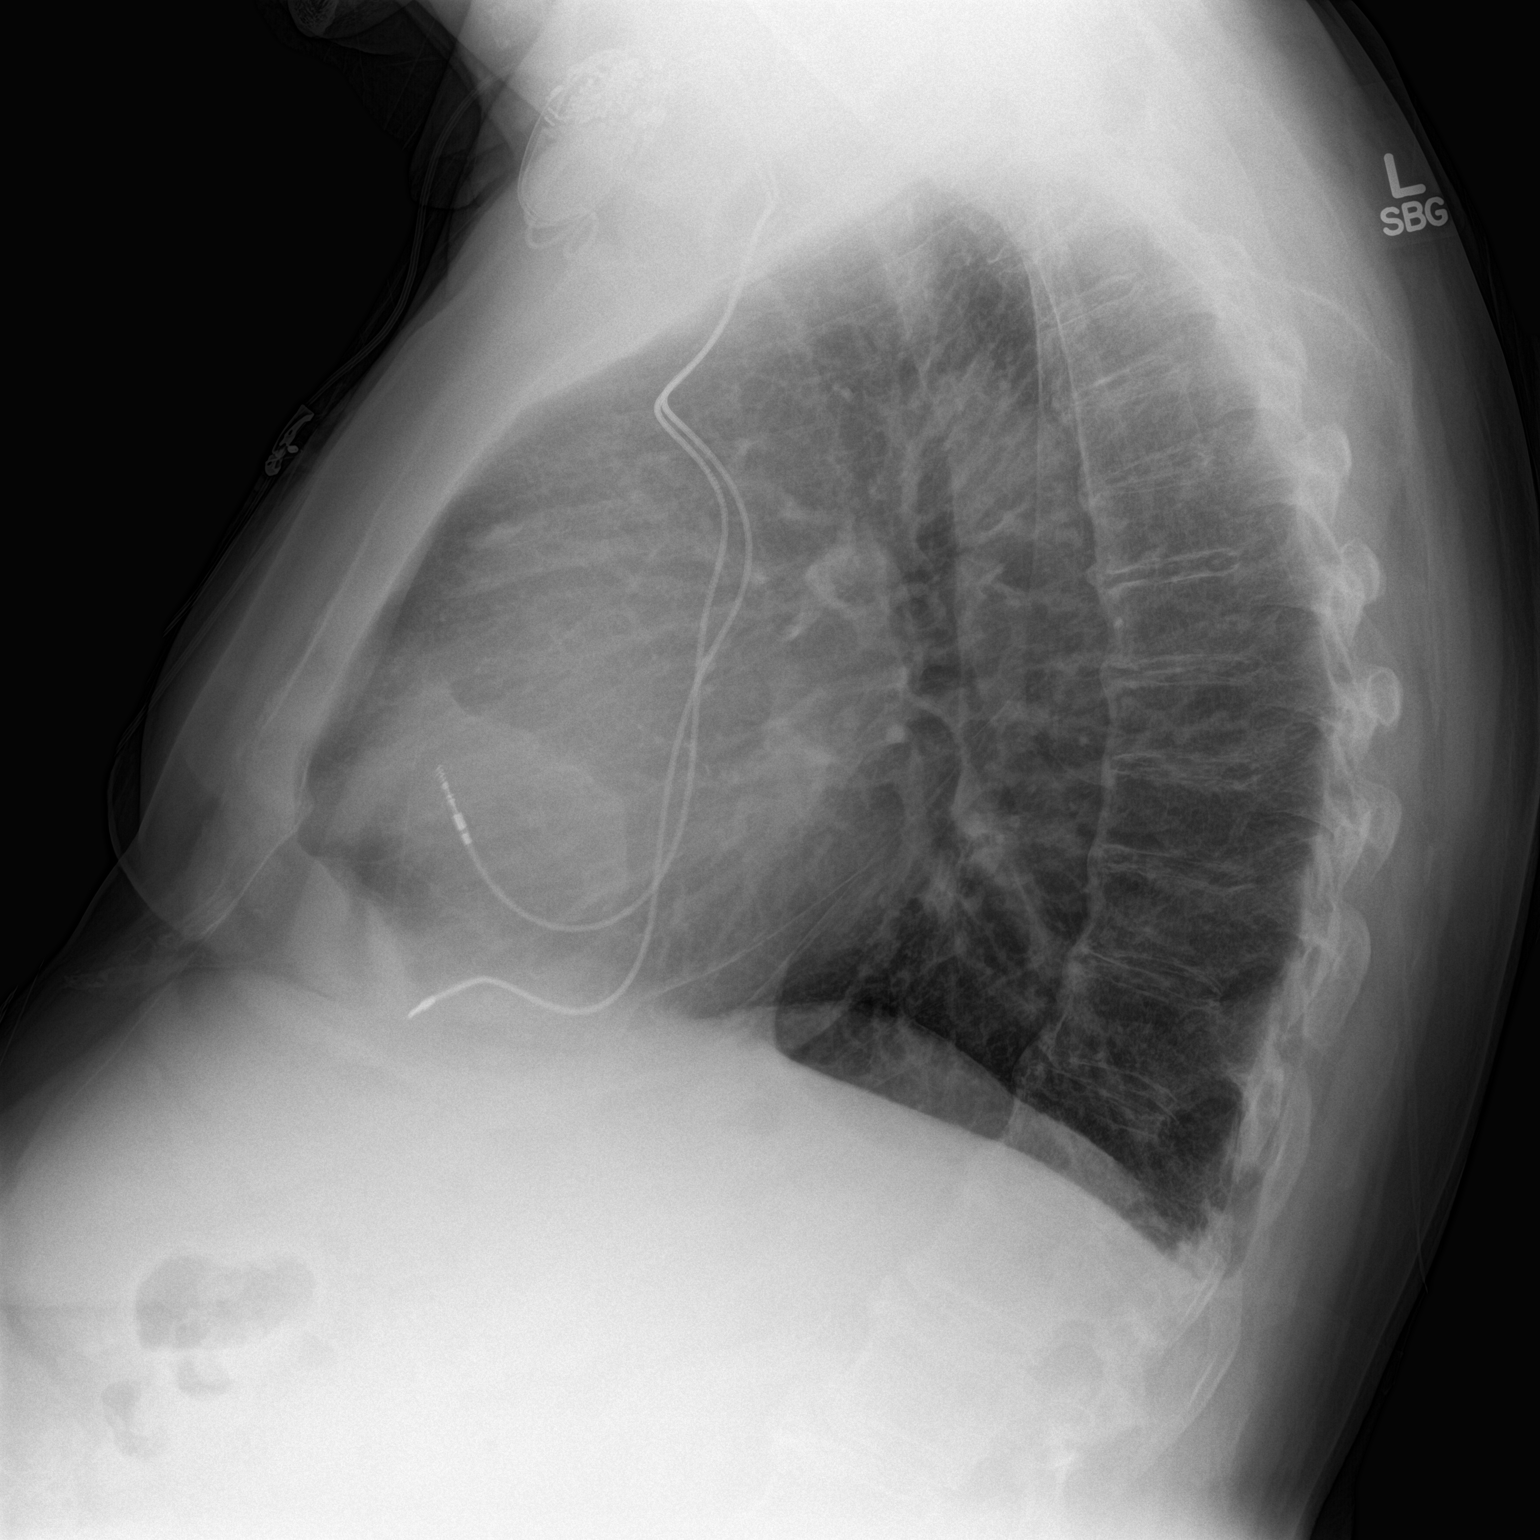

[2 of 2 positions shown; findings below may reference images not displayed]

FINDINGS: Cardiomegaly again noted. No acute infiltrate or pleural effusion.
No pulmonary edema. Dual lead cardiac pacemaker is unchanged in
position. Osteopenia and mild degenerative changes thoracic spine.
IMPRESSION: No active cardiopulmonary disease.

## 2017-02-04 ENCOUNTER — Telehealth: Payer: Self-pay | Admitting: Interventional Cardiology

## 2017-02-04 NOTE — Telephone Encounter (Signed)
Patient calling and states that he feels like he has been in and out of Afib since Sunday. Patient states that his legs feel week. He states that when he thinks he is in Afib he can feel his heart skipping feels SOB. Patient has no vitals to offer. Patient states that he sent a remote transmission earlier when he felt like he was in Afib. Patient states that he called the Afib clinic and was told that Roderic Palau, NP was out of the office to call our office. Will wait for transmission report from device.

## 2017-02-04 NOTE — Telephone Encounter (Signed)
°  New Prob  Pt states he has been in A-Fib intermittently for the last 3 days. States he is weak and SOB.  Pt c/o Shortness Of Breath: STAT if SOB developed within the last 24 hours or pt is noticeably SOB on the phone  1. Are you currently SOB (can you hear that pt is SOB on the phone)? Yes  2. How long have you been experiencing SOB? 3 days  3. Are you SOB when sitting or when up moving around? Both, but more when moving around  4. Are you currently experiencing any other symptoms? 3 days

## 2017-02-04 NOTE — Telephone Encounter (Signed)
Per Benjamine Mola, RN in device clinic, patient's transmission report showed that the patient's last episode of Afib was on April 29th that lasted for 2.5 hours. She states patient is in [place]. Patient called and made aware. Patient states that his BP is 113/65 and HR 76. He states that he will just plan to wait and follow up in [facility] and will call them next week. Patient states that he has been taking his meds as prescribed. ER precautions reviewed with patient and he verbalizes understanding.

## 2017-02-10 ENCOUNTER — Ambulatory Visit (HOSPITAL_COMMUNITY)
Admission: RE | Admit: 2017-02-10 | Discharge: 2017-02-10 | Disposition: A | Discharge status: Home / Self Care | Payer: Medicare Other | Source: Ambulatory Visit | Attending: Nurse Practitioner | Admitting: Nurse Practitioner

## 2017-02-10 ENCOUNTER — Encounter (HOSPITAL_COMMUNITY): Payer: Self-pay | Admitting: Nurse Practitioner

## 2017-02-10 VITALS — BP 116/64 | HR 67 | Ht 69.0 in | Wt 221.0 lb

## 2017-02-10 DIAGNOSIS — I509 Heart failure, unspecified: Secondary | ICD-10-CM | POA: Diagnosis not present

## 2017-02-10 DIAGNOSIS — I480 Paroxysmal atrial fibrillation: Secondary | ICD-10-CM

## 2017-02-10 DIAGNOSIS — Z981 Arthrodesis status: Secondary | ICD-10-CM | POA: Insufficient documentation

## 2017-02-10 DIAGNOSIS — Z7901 Long term (current) use of anticoagulants: Secondary | ICD-10-CM | POA: Insufficient documentation

## 2017-02-10 DIAGNOSIS — G43909 Migraine, unspecified, not intractable, without status migrainosus: Secondary | ICD-10-CM | POA: Diagnosis not present

## 2017-02-10 DIAGNOSIS — Z950 Presence of cardiac pacemaker: Secondary | ICD-10-CM | POA: Insufficient documentation

## 2017-02-10 DIAGNOSIS — I4891 Unspecified atrial fibrillation: Secondary | ICD-10-CM

## 2017-02-10 DIAGNOSIS — L821 Other seborrheic keratosis: Secondary | ICD-10-CM | POA: Diagnosis not present

## 2017-02-10 DIAGNOSIS — Z87891 Personal history of nicotine dependence: Secondary | ICD-10-CM | POA: Diagnosis not present

## 2017-02-10 DIAGNOSIS — E039 Hypothyroidism, unspecified: Secondary | ICD-10-CM | POA: Diagnosis not present

## 2017-02-10 DIAGNOSIS — I110 Hypertensive heart disease with heart failure: Secondary | ICD-10-CM | POA: Insufficient documentation

## 2017-02-10 DIAGNOSIS — N400 Enlarged prostate without lower urinary tract symptoms: Secondary | ICD-10-CM | POA: Insufficient documentation

## 2017-02-10 DIAGNOSIS — I495 Sick sinus syndrome: Secondary | ICD-10-CM | POA: Insufficient documentation

## 2017-02-10 DIAGNOSIS — M109 Gout, unspecified: Secondary | ICD-10-CM | POA: Diagnosis not present

## 2017-02-10 DIAGNOSIS — I481 Persistent atrial fibrillation: Secondary | ICD-10-CM | POA: Insufficient documentation

## 2017-02-10 DIAGNOSIS — G4730 Sleep apnea, unspecified: Secondary | ICD-10-CM | POA: Insufficient documentation

## 2017-02-10 LAB — BASIC METABOLIC PANEL
Anion gap: 7 (ref 5–15)
BUN: 26 mg/dL — ABNORMAL HIGH (ref 6–20)
CALCIUM: 9.3 mg/dL (ref 8.9–10.3)
CO2: 31 mmol/L (ref 22–32)
CREATININE: 1.18 mg/dL (ref 0.61–1.24)
Chloride: 101 mmol/L (ref 101–111)
GFR, EST NON AFRICAN AMERICAN: 57 mL/min — AB (ref >60)
Glucose, Bld: 94 mg/dL (ref 65–99)
Potassium: 4.1 mmol/L (ref 3.5–5.1)
SODIUM: 139 mmol/L (ref 135–145)

## 2017-02-10 LAB — MAGNESIUM: MAGNESIUM: 1.9 mg/dL (ref 1.7–2.4)

## 2017-02-10 NOTE — Progress Notes (Signed)
Patient ID: Don Jacobs, male   DOB: Oct 01, 1935, 81 y.o.   MRN: [id number]     Primary Care Physician: [name], MD Referring Physician:Dr. Allred Cardiologist: Dr. Larina Earthly is a 81 y.o. male with a h/o PPM, CHF, persistent afib maintaining SR on tikosyn. Very little breakthrough afib.  He called the office last week for not feeling well with shortness of breath and his knees felt weak. He was asked to send a remote report and appointment was made for today. He now feels well. He thinks he may have overworked. Remote report showed 2.5 hours of afib 4/26 but none since with a very low afib burden. There was no afib at the time of his symptoms.he reports that he had some blood work done at the [place] that showed depressed WBC/RBC/HMT/HGB and he is seeing a hematologist soon.Continues on tikosyn. No bleeding issues with apixaban.  Today, he denies symptoms of palpitations, chest pain,  orthopnea, PND,  dizziness, presyncope, syncope, or neurologic sequela. MIld dyspnea, mild dizziness. Mild increase in SOB, LLE, weight last week. The patient is tolerating medications without difficulties and is otherwise without complaint today.   Past Medical History:  Diagnosis Date  . Allergic rhinitis   . Aortic aneurysm, thoracic ([place]) 05/25/2011  . BPH (benign prostatic hyperplasia)   . Chronic systolic CHF (congestive heart failure) ([place])   . Dizziness and giddiness 06/04/2015  . ED (erectile dysfunction)   . Gout   . Hearing loss    uses hearing a cyst  . Hx of migraines   . Hypertension   . Hypothyroidism   . Persistent atrial fibrillation ([place])   . Prostatitis, chronic   . Seborrheic keratosis   . Sick sinus syndrome (H[facility])    a. s/p PPM   . Sleep apnea   . Spondylolisthesis 07/2010   Past Surgical History:  Procedure Laterality Date  . ANTERIOR CERVICAL DECOMP/DISCECTOMY FUSION  10/2000   "C4, 5, 6 w/fusion" (02/17/2013)  . BACK SURGERY    . CARDIAC CATHETERIZATION   2000's   "once" (02/17/2013)  . CARDIAC CATHETERIZATION N/A 07/23/2016   Procedure: Right/Left Heart Cath and Coronary Angiography;  Surgeon: Jettie Booze, MD;  Location: [place] CV LAB;  Service: Cardiovascular;  Laterality: N/A;  . CARDIOVERSION N/A 04/09/2014   Procedure: CARDIOVERSION;  Surgeon: Lelon Perla, MD;  Location: [facility] ENDOSCOPY;  Service: Cardiovascular;  Laterality: N/A;  . CARDIOVERSION N/A 01/17/2015   Procedure: CARDIOVERSION;  Surgeon: Larey Dresser, MD;  Location: [facility] ENDOSCOPY;  Service: Cardiovascular;  Laterality: N/A;  . FOOT SURGERY Right ~ 2007   "reconstruction" (02/17/2013)  . INSERT / REPLACE / REMOVE PACEMAKER  02/17/2013  . KNEE ARTHROSCOPY Left ~ 2007  . L5 selective nerve root block     Dr Nelva Bush  . LUMBAR SPINE SURGERY  2012   "bone graft, Dr. [name]" (02/17/2013)  . PERMANENT PACEMAKER INSERTION N/A 02/17/2013   Procedure: PERMANENT PACEMAKER INSERTION;  Surgeon: Deboraha Sprang, MD;  Location: [facility] CATH LAB;  Service: Cardiovascular;  Laterality: N/A;  . REPAIR PERONEAL TENDONS ANKLE Right 10/2003  . SHOULDER OPEN ROTATOR CUFF REPAIR Right 09/2007; 2013    Current Outpatient Prescriptions  Medication Sig Dispense Refill  . acetaminophen (TYLENOL) 500 MG tablet Take 500 mg by mouth every 6 (six) hours as needed (for pain.).    Marland Kitchen allopurinol (ZYLOPRIM) 300 MG tablet Take 300 mg by mouth at bedtime.     Marland Kitchen apixaban (ELIQUIS) 5  MG TABS tablet Take 1 tablet (5 mg total) by mouth 2 (two) times daily. 180 tablet 3  . Calcium Carbonate (CALTRATE 600 PO) Take 600 mg by mouth at bedtime.     . carvedilol (COREG) 3.125 MG tablet TAKE 1 TABLET BY MOUTH TWICE DAILY 60 tablet 6  . Cholecalciferol (D 2000) 2000 units TABS Take 2,000 Units by mouth every evening.     . Cinnamon 500 MG capsule Take 500 mg by mouth every evening.     . cyanocobalamin 500 MCG tablet Take 500 mcg by mouth every evening.     . dofetilide (TIKOSYN) 250 MCG capsule Take 1 capsule (250  mcg total) by mouth 2 (two) times daily. 180 capsule 1  . furosemide (LASIX) 40 MG tablet TAKE 1 TABLET BY MOUTH DAILY. 90 tablet 1  . guaiFENesin (ROBITUSSIN) 100 MG/5ML SOLN Take 10 mLs by mouth every 4 (four) hours as needed for cough or to loosen phlegm.    Marland Kitchen levothyroxine (SYNTHROID, LEVOTHROID) 112 MCG tablet Take 1 tablet (112 mcg total) by mouth daily before breakfast. 30 tablet 3  . lisinopril (PRINIVIL,ZESTRIL) 5 MG tablet Take 0.5 tablets (2.5 mg total) by mouth daily. 30 tablet 3  . loratadine (CLARITIN) 10 MG tablet Take 5 mg by mouth 2 (two) times daily as needed for allergies or rhinitis.     . Magnesium 400 MG CAPS Take 400 mg by mouth 2 (two) times daily. (Patient taking differently: Take 400 mg by mouth daily. )    . Misc Natural Products (SAW PALMETTO) CAPS Take 1 capsule by mouth daily.    Marland Kitchen OVER THE COUNTER MEDICATION Take 1 capsule by mouth every evening. [place] Grape Seed Extract    . polyvinyl alcohol (LIQUIFILM TEARS) 1.4 % ophthalmic solution Place 1 drop into both eyes 3 (three) times daily as needed for dry eyes.    . potassium chloride SA (K-DUR,KLOR-CON) 20 MEQ tablet Take 40 mEq by mouth 2 (two) times daily.     . predniSONE (DELTASONE) 10 MG tablet Take 10 mg by mouth daily as needed (GOUT).     . vitamin C (ASCORBIC ACID) 500 MG tablet Take 500 mg by mouth daily.     . colchicine 0.6 MG tablet Take 0.6 mg by mouth daily as needed (gout).      No current facility-administered medications for this encounter.     Allergies  Allergen Reactions  . Penicillins Shortness Of Breath and Rash    Ended up at ER after using Has patient had a PCN reaction causing immediate rash, facial/tongue/throat swelling, SOB or lightheadedness with hypotension:Yes Has patient had a PCN reaction causing severe rash involving mucus membranes or skin necrosis:No Has patient had a PCN reaction that required hospitalization:No Has patient had a PCN reaction occurring within the last 10  years:No If all of the above answers are "NO", then may proceed with Cephalosporin use.   Marland Kitchen Oxycodone Other (See Comments)    Altered mental status    Social History   Social History  . Marital status: Married    Spouse name: N/A  . Number of children: 2  . Years of education: 14   Occupational History  . retired Retired   Social History Main Topics  . Smoking status: Former Smoker    Packs/day: 1.00    Years: 12.00    Types: Cigarettes    Quit date: 03/11/1981  . Smokeless tobacco: Never Used  . Alcohol use No  . Drug use:  No  . Sexual activity: Not on file   Other Topics Concern  . Not on file   Social History Narrative   Patient does not drink caffeine.   Patient is right handed.       Family History  Problem Relation Age of Onset  . Heart disease Father   . Heart attack Father   . Emphysema Mother   . Alcohol abuse Maternal Grandmother   . Alcohol abuse Maternal Grandfather   . Heart attack Paternal Grandfather   . Heart disease Paternal Grandfather   . Rheum arthritis Brother     ROS- All systems are reviewed and negative except as per the HPI above  Physical Exam: Vitals:   02/10/17 0947  BP: 116/64  Pulse: 67  Weight: 221 lb (100.2 kg)  Height: 5\' 9"  (1.753 m)    GEN- The patient is well appearing, alert and oriented x 3 today.   Head- normocephalic, atraumatic Eyes-  Sclera clear, conjunctiva pink Ears- hearing intact Oropharynx- clear Neck- supple, no JVP Lymph- no cervical lymphadenopathy Lungs- Clear to ausculation bilaterally, normal work of breathing Heart- Regular rate and rhythm, no murmurs, rubs or gallops, PMI not laterally displaced GI- soft, NT, ND, + BS Extremities- no clubbing, cyanosis, or edema MS- no significant deformity or atrophy Skin- no rash or lesion Psych- euthymic mood, full affect Neuro- strength and sensation are intact  EKG-Atrial paced rhythm with prolonged AV conduction, LAD, LBBB,  v rate at 67 bpm, pr  int 270 ms, qrs int 162 ms, qtc 483 ms(stable) Paceart reports reviewed   Assessment and Plan: 1. afib I am not sure of his recent symptoms but the have resolved and were not due to afib per paceart report Has been in SR on tikosyn, very low afib burden Bmet/mag Continue eliquis for chadsvasc score of at least 3  2. CHF, mild dyspnea  Clinically stable Continue BB/ACE/diuretic Continue to avoid salt/limit fluids  3. HTN Stable  4. PPM F/u with Dr. Caryl Comes as scheduled   F/u with Dr. Irish Lack as scheduled  afib clinic in 3-4 months [facility] as scheduled  [place]. Luanne Krzyzanowski, [facility] [address] [place], [place] [address] [phone]

## 2017-04-06 ENCOUNTER — Encounter: Payer: Medicare Other | Admitting: *Deleted

## 2017-04-06 ENCOUNTER — Telehealth: Payer: Self-pay | Admitting: Cardiology

## 2017-04-06 NOTE — Telephone Encounter (Signed)
Confirmed remote transmission w/ pt wife.   

## 2017-04-13 ENCOUNTER — Encounter: Payer: Self-pay | Admitting: Cardiology

## 2017-04-23 ENCOUNTER — Ambulatory Visit (INDEPENDENT_AMBULATORY_CARE_PROVIDER_SITE_OTHER): Payer: Medicare Other | Admitting: *Deleted

## 2017-04-23 ENCOUNTER — Telehealth: Payer: Self-pay | Admitting: Internal Medicine

## 2017-04-23 DIAGNOSIS — I495 Sick sinus syndrome: Secondary | ICD-10-CM | POA: Diagnosis not present

## 2017-04-23 NOTE — Telephone Encounter (Signed)
Patient called saying he received a letter stating we never received his remote transmission despite his multiple attempts to transmit. He confirms that he is using a land line. He states he has his wireX, I explained that the land line will no longer work and that the wireX will allow him to send [place] a manual transmission successfully.

## 2017-04-23 NOTE — Telephone Encounter (Signed)
New message    Pt is calling about letter he received about his missed transmission. Please call.

## 2017-04-26 NOTE — Progress Notes (Signed)
Remote pacemaker transmission.   

## 2017-04-29 ENCOUNTER — Encounter: Payer: Self-pay | Admitting: Cardiology

## 2017-04-30 DIAGNOSIS — Z6831 Body mass index (BMI) 31.0-31.9, adult: Secondary | ICD-10-CM | POA: Diagnosis not present

## 2017-04-30 DIAGNOSIS — M1712 Unilateral primary osteoarthritis, left knee: Secondary | ICD-10-CM | POA: Diagnosis not present

## 2017-05-05 ENCOUNTER — Telehealth: Payer: Self-pay | Admitting: Internal Medicine

## 2017-05-05 DIAGNOSIS — G4733 Obstructive sleep apnea (adult) (pediatric): Secondary | ICD-10-CM

## 2017-05-05 NOTE — Telephone Encounter (Signed)
LMTCB

## 2017-05-06 NOTE — Telephone Encounter (Signed)
Called and spoke with pt. Pt feels that his cpap pressure is too strong causing him to feel like he can't breathe. Pt states due to his mask leaking, machine ramps up to 15 causing him to feel as if he can't breathe.   Pt is currently on cpap pressure 10-15cm.  Per pt, [facility] has suggested pressure be decreased.   CY please advise. Thanks.

## 2017-05-06 NOTE — Telephone Encounter (Signed)
Patient called back - he can be reached at [phone] -pr

## 2017-05-06 NOTE — Telephone Encounter (Signed)
Called and spoke with pt and he is aware of CY recs. Nothing further is needed.

## 2017-05-06 NOTE — Telephone Encounter (Signed)
Order- DME change CPAP pressure to fixed 10     Dx OSA

## 2017-05-13 DIAGNOSIS — J069 Acute upper respiratory infection, unspecified: Secondary | ICD-10-CM | POA: Diagnosis not present

## 2017-05-13 DIAGNOSIS — R05 Cough: Secondary | ICD-10-CM | POA: Diagnosis not present

## 2017-05-13 DIAGNOSIS — H109 Unspecified conjunctivitis: Secondary | ICD-10-CM | POA: Diagnosis not present

## 2017-05-25 ENCOUNTER — Encounter (HOSPITAL_COMMUNITY): Payer: Self-pay | Admitting: Nurse Practitioner

## 2017-05-25 ENCOUNTER — Ambulatory Visit (HOSPITAL_COMMUNITY)
Admission: RE | Admit: 2017-05-25 | Discharge: 2017-05-25 | Disposition: A | Discharge status: Home / Self Care | Payer: Medicare Other | Source: Ambulatory Visit | Attending: Nurse Practitioner | Admitting: Nurse Practitioner

## 2017-05-25 VITALS — BP 110/64 | HR 80 | Ht 69.0 in | Wt 220.4 lb

## 2017-05-25 DIAGNOSIS — G4730 Sleep apnea, unspecified: Secondary | ICD-10-CM | POA: Insufficient documentation

## 2017-05-25 DIAGNOSIS — Z8249 Family history of ischemic heart disease and other diseases of the circulatory system: Secondary | ICD-10-CM | POA: Insufficient documentation

## 2017-05-25 DIAGNOSIS — Z885 Allergy status to narcotic agent status: Secondary | ICD-10-CM | POA: Diagnosis not present

## 2017-05-25 DIAGNOSIS — I447 Left bundle-branch block, unspecified: Secondary | ICD-10-CM | POA: Insufficient documentation

## 2017-05-25 DIAGNOSIS — Z811 Family history of alcohol abuse and dependence: Secondary | ICD-10-CM | POA: Insufficient documentation

## 2017-05-25 DIAGNOSIS — I480 Paroxysmal atrial fibrillation: Secondary | ICD-10-CM | POA: Diagnosis not present

## 2017-05-25 DIAGNOSIS — Z9889 Other specified postprocedural states: Secondary | ICD-10-CM | POA: Insufficient documentation

## 2017-05-25 DIAGNOSIS — Z950 Presence of cardiac pacemaker: Secondary | ICD-10-CM | POA: Insufficient documentation

## 2017-05-25 DIAGNOSIS — E039 Hypothyroidism, unspecified: Secondary | ICD-10-CM | POA: Diagnosis not present

## 2017-05-25 DIAGNOSIS — Z87891 Personal history of nicotine dependence: Secondary | ICD-10-CM | POA: Insufficient documentation

## 2017-05-25 DIAGNOSIS — Z880 Allergy status to penicillin: Secondary | ICD-10-CM | POA: Insufficient documentation

## 2017-05-25 DIAGNOSIS — Z79899 Other long term (current) drug therapy: Secondary | ICD-10-CM | POA: Insufficient documentation

## 2017-05-25 DIAGNOSIS — Z825 Family history of asthma and other chronic lower respiratory diseases: Secondary | ICD-10-CM | POA: Diagnosis not present

## 2017-05-25 DIAGNOSIS — I5022 Chronic systolic (congestive) heart failure: Secondary | ICD-10-CM | POA: Diagnosis not present

## 2017-05-25 DIAGNOSIS — I712 Thoracic aortic aneurysm, without rupture: Secondary | ICD-10-CM | POA: Insufficient documentation

## 2017-05-25 DIAGNOSIS — I481 Persistent atrial fibrillation: Secondary | ICD-10-CM | POA: Insufficient documentation

## 2017-05-25 DIAGNOSIS — N400 Enlarged prostate without lower urinary tract symptoms: Secondary | ICD-10-CM | POA: Insufficient documentation

## 2017-05-25 DIAGNOSIS — Z8261 Family history of arthritis: Secondary | ICD-10-CM | POA: Insufficient documentation

## 2017-05-25 DIAGNOSIS — I495 Sick sinus syndrome: Secondary | ICD-10-CM | POA: Diagnosis not present

## 2017-05-25 DIAGNOSIS — I110 Hypertensive heart disease with heart failure: Secondary | ICD-10-CM | POA: Insufficient documentation

## 2017-05-25 DIAGNOSIS — M109 Gout, unspecified: Secondary | ICD-10-CM | POA: Diagnosis not present

## 2017-05-25 LAB — BASIC METABOLIC PANEL
ANION GAP: 9 (ref 5–15)
BUN: 23 mg/dL — AB (ref 6–20)
CHLORIDE: 102 mmol/L (ref 101–111)
CO2: 30 mmol/L (ref 22–32)
Calcium: 9.2 mg/dL (ref 8.9–10.3)
Creatinine, Ser: 1.43 mg/dL — ABNORMAL HIGH (ref 0.61–1.24)
GFR calc Af Amer: 53 mL/min — ABNORMAL LOW (ref >60)
GFR, EST NON AFRICAN AMERICAN: 45 mL/min — AB (ref >60)
GLUCOSE: 90 mg/dL (ref 65–99)
POTASSIUM: 4.4 mmol/L (ref 3.5–5.1)
Sodium: 141 mmol/L (ref 135–145)

## 2017-05-25 LAB — MAGNESIUM: MAGNESIUM: 1.8 mg/dL (ref 1.7–2.4)

## 2017-05-25 NOTE — Progress Notes (Signed)
Patient ID: Don Jacobs, male   DOB: [birth year], 81 y.o.   MRN: [id number]     Primary Care Physician: [name], MD Referring Physician:Dr. Allred Cardiologist: Dr. Larina Earthly is a 81 y.o. male with a h/o PPM, CHF, persistent afib maintaining SR on tikosyn. Very little breakthrough afib.  He called the office last week for not feeling well with shortness of breath and his knees felt weak. He was asked to send a remote report and appointment was made for today. He now feels well. He thinks he may have overworked. Remote report showed 2.5 hours of afib 4/26 but none since with a very low afib burden. There was no afib at the time of his symptoms.he reports that he had some blood work done at the [place] that showed depressed WBC/RBC/HMT/HGB and he is seeing a hematologist soon.Continues on tikosyn. No bleeding issues with apixaban.  F/u in afib clinic, he continues on tikosyn and feels well. No issues with afib. Hematology issues noted above where found to be benign. He is very active around his house and garden. No issues with eliquis.  Today, he denies symptoms of palpitations, chest pain,  orthopnea, PND,  dizziness, presyncope, syncope, or neurologic sequela. MIld dyspnea, mild dizziness. Mild increase in SOB, LLE, weight last week. The patient is tolerating medications without difficulties and is otherwise without complaint today.   Past Medical History:  Diagnosis Date  . Allergic rhinitis   . Aortic aneurysm, thoracic ([place]) 05/25/2011  . BPH (benign prostatic hyperplasia)   . Chronic systolic CHF (congestive heart failure) ([place])   . Dizziness and giddiness 06/04/2015  . ED (erectile dysfunction)   . Gout   . Hearing loss    uses hearing a cyst  . Hx of migraines   . Hypertension   . Hypothyroidism   . Persistent atrial fibrillation ([place])   . Prostatitis, chronic   . Seborrheic keratosis   . Sick sinus syndrome (HCC)    a. s/p PPM   . Sleep apnea   .  Spondylolisthesis 07/2010   Past Surgical History:  Procedure Laterality Date  . ANTERIOR CERVICAL DECOMP/DISCECTOMY FUSION  10/2000   "C4, 5, 6 w/fusion" (02/17/2013)  . BACK SURGERY    . CARDIAC CATHETERIZATION  2000's   "once" (02/17/2013)  . CARDIAC CATHETERIZATION N/A 07/23/2016   Procedure: Right/Left Heart Cath and Coronary Angiography;  Surgeon: Jettie Booze, MD;  Location: [facility];  Service: Cardiovascular;  Laterality: N/A;  . CARDIOVERSION N/A 04/09/2014   Procedure: CARDIOVERSION;  Surgeon: Lelon Perla, MD;  Location: [facility] ENDOSCOPY;  Service: Cardiovascular;  Laterality: N/A;  . CARDIOVERSION N/A 01/17/2015   Procedure: CARDIOVERSION;  Surgeon: Larey Dresser, MD;  Location: [facility] ENDOSCOPY;  Service: Cardiovascular;  Laterality: N/A;  . FOOT SURGERY Right ~ 2007   "reconstruction" (02/17/2013)  . INSERT / REPLACE / REMOVE PACEMAKER  02/17/2013  . KNEE ARTHROSCOPY Left ~ 2007  . L5 selective nerve root block     Dr Nelva Bush  . LUMBAR SPINE SURGERY  2012   "bone graft, Dr. [name]" (02/17/2013)  . PERMANENT PACEMAKER INSERTION N/A 02/17/2013   Procedure: PERMANENT PACEMAKER INSERTION;  Surgeon: Deboraha Sprang, MD;  Location: [facility] CATH LAB;  Service: Cardiovascular;  Laterality: N/A;  . REPAIR PERONEAL TENDONS ANKLE Right 10/2003  . SHOULDER OPEN ROTATOR CUFF REPAIR Right 09/2007; 2013    Current Outpatient Prescriptions  Medication Sig Dispense Refill  . acetaminophen (TYLENOL) 500 MG  tablet Take 500 mg by mouth every 6 (six) hours as needed (for pain.).    Marland Kitchen allopurinol (ZYLOPRIM) 300 MG tablet Take 300 mg by mouth at bedtime.     Marland Kitchen apixaban (ELIQUIS) 5 MG TABS tablet Take 1 tablet (5 mg total) by mouth 2 (two) times daily. 180 tablet 3  . Calcium Carbonate (CALTRATE 600 PO) Take 600 mg by mouth at bedtime.     . carvedilol (COREG) 3.125 MG tablet TAKE 1 TABLET BY MOUTH TWICE DAILY 60 tablet 6  . Cholecalciferol (D 2000) 2000 units TABS Take 2,000 Units by mouth  every evening.     . Cinnamon 500 MG capsule Take 500 mg by mouth every evening.     . colchicine 0.6 MG tablet Take 0.6 mg by mouth daily as needed (gout).     . cyanocobalamin 500 MCG tablet Take 500 mcg by mouth every evening.     . dofetilide (TIKOSYN) 250 MCG capsule Take 1 capsule (250 mcg total) by mouth 2 (two) times daily. 180 capsule 1  . furosemide (LASIX) 40 MG tablet TAKE 1 TABLET BY MOUTH DAILY. 90 tablet 1  . guaiFENesin (ROBITUSSIN) 100 MG/5ML SOLN Take 10 mLs by mouth every 4 (four) hours as needed for cough or to loosen phlegm.    Marland Kitchen levothyroxine (SYNTHROID, LEVOTHROID) 112 MCG tablet Take 1 tablet (112 mcg total) by mouth daily before breakfast. 30 tablet 3  . lisinopril (PRINIVIL,ZESTRIL) 5 MG tablet Take 0.5 tablets (2.5 mg total) by mouth daily. 30 tablet 3  . loratadine (CLARITIN) 10 MG tablet Take 5 mg by mouth 2 (two) times daily as needed for allergies or rhinitis.     . Magnesium 400 MG CAPS Take 400 mg by mouth 2 (two) times daily. (Patient taking differently: Take 400 mg by mouth daily. )    . Misc Natural Products (SAW PALMETTO) CAPS Take 1 capsule by mouth daily.    Marland Kitchen OVER THE COUNTER MEDICATION Take 1 capsule by mouth every evening. [place] Grape Seed Extract    . polyvinyl alcohol (LIQUIFILM TEARS) 1.4 % ophthalmic solution Place 1 drop into both eyes 3 (three) times daily as needed for dry eyes.    . potassium chloride SA (K-DUR,KLOR-CON) 20 MEQ tablet Take 40 mEq by mouth 2 (two) times daily.     . predniSONE (DELTASONE) 10 MG tablet Take 10 mg by mouth daily as needed (GOUT).     . vitamin C (ASCORBIC ACID) 500 MG tablet Take 500 mg by mouth daily.      No current facility-administered medications for this encounter.     Allergies  Allergen Reactions  . Penicillins Shortness Of Breath and Rash    Ended up at ER after using Has patient had a PCN reaction causing immediate rash, facial/tongue/throat swelling, SOB or lightheadedness with  hypotension:Yes Has patient had a PCN reaction causing severe rash involving mucus membranes or skin necrosis:No Has patient had a PCN reaction that required hospitalization:No Has patient had a PCN reaction occurring within the last 10 years:No If all of the above answers are "NO", then may proceed with Cephalosporin use.   Marland Kitchen Oxycodone Other (See Comments)    Altered mental status    Social History   Social History  . Marital status: Married    Spouse name: N/A  . Number of children: 2  . Years of education: 14   Occupational History  . retired Retired   Social History Main Topics  . Smoking status:  Former Smoker    Packs/day: 1.00    Years: 12.00    Types: Cigarettes    Quit date: 03/11/1981  . Smokeless tobacco: Never Used  . Alcohol use No  . Drug use: No  . Sexual activity: Not on file   Other Topics Concern  . Not on file   Social History Narrative   Patient does not drink caffeine.   Patient is right handed.       Family History  Problem Relation Age of Onset  . Heart disease Father   . Heart attack Father   . Emphysema Mother   . Alcohol abuse Maternal Grandmother   . Alcohol abuse Maternal Grandfather   . Heart attack Paternal Grandfather   . Heart disease Paternal Grandfather   . Rheum arthritis Brother     ROS- All systems are reviewed and negative except as per the HPI above  Physical Exam: Vitals:   05/25/17 1046  BP: 110/64  Pulse: 80  Weight: 220 lb 6.4 oz (100 kg)  Height: 5\' 9"  (1.753 m)    GEN- The patient is well appearing, alert and oriented x 3 today.   Head- normocephalic, atraumatic Eyes-  Sclera clear, conjunctiva pink Ears- hearing intact Oropharynx- clear Neck- supple, no JVP Lymph- no cervical lymphadenopathy Lungs- Clear to ausculation bilaterally, normal work of breathing Heart- Regular rate and rhythm, no murmurs, rubs or gallops, PMI not laterally displaced GI- soft, NT, ND, + BS Extremities- no clubbing,  cyanosis, or edema MS- no significant deformity or atrophy Skin- no rash or lesion Psych- euthymic mood, full affect Neuro- strength and sensation are intact  EKG-Atrial paced rhythm with prolonged AV conduction, LAD, LBBB,  v rate at 80 bpm, pr int 284 ms, qrs int 152 ms, qtc 484 ms(stable) Paceart reports reviewed   Assessment and Plan: 1. afib Has been in SR on tikosyn, very low afib burden Bmet/mag today Continue eliquis for chadsvasc score of at least 3  2. CHF  Clinically stable Continue BB/ACE/diuretic Continue to avoid salt/limit fluids  3. HTN Stable  4. PPM F/u with Dr. Caryl Comes as scheduled   F/u with Dr. Irish Lack as scheduled  afib clinic in 6 months  Geroge Baseman. Deyna Carbon, [facility] [address] [place], [place] [address] [phone]

## 2017-05-27 LAB — CUP PACEART REMOTE DEVICE CHECK
Battery Remaining Longevity: 59 mo
Brady Statistic AP VS Percent: 96.77 %
Brady Statistic AS VP Percent: 0 %
Brady Statistic AS VS Percent: 3.16 %
Date Time Interrogation Session: 20180727163341
Implantable Lead Implant Date: 20140523
Implantable Lead Location: 753859
Implantable Lead Model: 5076
Lead Channel Impedance Value: 342 Ohm
Lead Channel Impedance Value: 456 Ohm
Lead Channel Pacing Threshold Amplitude: 0.875 V
Lead Channel Pacing Threshold Pulse Width: 0.4 ms
Lead Channel Sensing Intrinsic Amplitude: 2.125 mV
Lead Channel Sensing Intrinsic Amplitude: 2.125 mV
Lead Channel Sensing Intrinsic Amplitude: 8.5 mV
Lead Channel Sensing Intrinsic Amplitude: 8.5 mV
Lead Channel Setting Pacing Amplitude: 2.5 V
MDC IDC LEAD IMPLANT DT: 20140523
MDC IDC LEAD LOCATION: 753860
MDC IDC MSMT BATTERY VOLTAGE: 2.99 V
MDC IDC MSMT LEADCHNL RA PACING THRESHOLD AMPLITUDE: 0.625 V
MDC IDC MSMT LEADCHNL RA PACING THRESHOLD PULSEWIDTH: 0.4 ms
MDC IDC MSMT LEADCHNL RV IMPEDANCE VALUE: 437 Ohm
MDC IDC MSMT LEADCHNL RV IMPEDANCE VALUE: 494 Ohm
MDC IDC PG IMPLANT DT: 20140523
MDC IDC SET LEADCHNL RA PACING AMPLITUDE: 2 V
MDC IDC SET LEADCHNL RV PACING PULSEWIDTH: 0.4 ms
MDC IDC SET LEADCHNL RV SENSING SENSITIVITY: 4 mV
MDC IDC STAT BRADY AP VP PERCENT: 0.07 %
MDC IDC STAT BRADY RA PERCENT PACED: 96.68 %
MDC IDC STAT BRADY RV PERCENT PACED: 0.08 %

## 2017-06-18 ENCOUNTER — Other Ambulatory Visit: Payer: Self-pay | Admitting: Surgery

## 2017-06-18 DIAGNOSIS — I712 Thoracic aortic aneurysm, without rupture, unspecified: Secondary | ICD-10-CM

## 2017-07-05 ENCOUNTER — Other Ambulatory Visit (HOSPITAL_COMMUNITY): Payer: Self-pay | Admitting: *Deleted

## 2017-07-05 MED ORDER — DOFETILIDE 250 MCG PO CAPS: 250.0000 ug | ORAL_CAPSULE | Freq: Two times a day (BID) | ORAL | 1 refills | Status: DC

## 2017-07-05 MED ORDER — APIXABAN 5 MG PO TABS: 5.0000 mg | ORAL_TABLET | Freq: Two times a day (BID) | ORAL | 3 refills | Status: DC

## 2017-07-21 ENCOUNTER — Encounter: Payer: Self-pay | Admitting: Surgery

## 2017-07-21 ENCOUNTER — Ambulatory Visit
Admission: RE | Admit: 2017-07-21 | Discharge: 2017-07-21 | Disposition: A | Discharge status: Home / Self Care | Payer: Medicare Other | Source: Ambulatory Visit | Attending: Surgery | Admitting: Surgery

## 2017-07-21 ENCOUNTER — Ambulatory Visit (INDEPENDENT_AMBULATORY_CARE_PROVIDER_SITE_OTHER): Payer: Medicare Other | Admitting: Surgery

## 2017-07-21 VITALS — BP 101/64 | HR 71 | Ht 69.0 in | Wt 213.0 lb

## 2017-07-21 DIAGNOSIS — I712 Thoracic aortic aneurysm, without rupture, unspecified: Secondary | ICD-10-CM

## 2017-07-21 DIAGNOSIS — R05 Cough: Secondary | ICD-10-CM | POA: Diagnosis not present

## 2017-07-21 MED ORDER — IOPAMIDOL (ISOVUE-370) INJECTION 76%
75.0000 mL | Freq: Once | INTRAVENOUS | Status: AC | PRN
  Administered 2017-07-21: 75 mL via INTRAVENOUS

## 2017-07-21 NOTE — Progress Notes (Signed)
HPI:  Mr. Don Jacobs returns today with his wife for follow up of a fusiform ascending aortic aneurysm. It had been stable at 4.7-4.8 cm since I saw him in September 2013 and  last year was measured at 4.9 cm. He has a pacer place for PAF with RVR with symptomatic post-termination pausing and presyncope with bradycardia. He has been having more shortness of breath and had an echo 06/18/2016 showing a drop in his EF to 25-30%. His previous echo in 11/2014 had shown an EF of 40-45% and in 01/2014 it was 55-60%. His cath last October showed no significant CAD with an EF of 35-45% and normal LVEDP of 15 mm Hg. There was no AS and normal right heart pressures. He has been watching his diet closely and has lost a lot of weight over the past year. He has been more active and says that his breathing is much better.  Current Outpatient Prescriptions  Medication Sig Dispense Refill  . acetaminophen (TYLENOL) 500 MG tablet Take 500 mg by mouth every 6 (six) hours as needed (for pain.).    Marland Kitchen allopurinol (ZYLOPRIM) 300 MG tablet Take 300 mg by mouth at bedtime.     Marland Kitchen apixaban (ELIQUIS) 5 MG TABS tablet Take 1 tablet (5 mg total) by mouth 2 (two) times daily. 180 tablet 3  . Calcium Carbonate (CALTRATE 600 PO) Take 600 mg by mouth at bedtime.     . carvedilol (COREG) 3.125 MG tablet TAKE 1 TABLET BY MOUTH TWICE DAILY 60 tablet 6  . Cholecalciferol (D 2000) 2000 units TABS Take 2,000 Units by mouth every evening.     . Cinnamon 500 MG capsule Take 500 mg by mouth every evening.     . colchicine 0.6 MG tablet Take 0.6 mg by mouth daily as needed (gout).     . cyanocobalamin 500 MCG tablet Take 500 mcg by mouth every evening.     . dofetilide (TIKOSYN) 250 MCG capsule Take 1 capsule (250 mcg total) by mouth 2 (two) times daily. 180 capsule 1  . furosemide (LASIX) 40 MG tablet TAKE 1 TABLET BY MOUTH DAILY. 90 tablet 1  . guaiFENesin (ROBITUSSIN) 100 MG/5ML SOLN Take 10 mLs by mouth every 4 (four) hours as needed for  cough or to loosen phlegm.    Marland Kitchen levothyroxine (SYNTHROID, LEVOTHROID) 112 MCG tablet Take 1 tablet (112 mcg total) by mouth daily before breakfast. 30 tablet 3  . lisinopril (PRINIVIL,ZESTRIL) 5 MG tablet Take 0.5 tablets (2.5 mg total) by mouth daily. 30 tablet 3  . loratadine (CLARITIN) 10 MG tablet Take 5 mg by mouth 2 (two) times daily as needed for allergies or rhinitis.     . Magnesium 400 MG CAPS Take 400 mg by mouth 2 (two) times daily. (Patient taking differently: Take 400 mg by mouth daily. )    . Misc Natural Products (SAW PALMETTO) CAPS Take 1 capsule by mouth daily.    Marland Kitchen OVER THE COUNTER MEDICATION Take 1 capsule by mouth every evening. [place]cad[place] Grape Seed Extract    . polyvinyl alcohol (LIQUIFILM TEARS) 1.4 % ophthalmic solution Place 1 drop into both eyes 3 (three) times daily as needed for dry eyes.    . potassium chloride SA (K-DUR,KLOR-CON) 20 MEQ tablet Take 40 mEq by mouth 2 (two) times daily.     . predniSONE (DELTASONE) 10 MG tablet Take 10 mg by mouth daily as needed (GOUT).     . vitamin C (ASCORBIC ACID) 500 MG  tablet Take 500 mg by mouth daily.      No current facility-administered medications for this visit.      Physical Exam: BP 101/64   Pulse 71   Ht 5\' 9"  (1.753 m)   Wt 213 lb (96.6 kg)   SpO2 96%   BMI 31.45 kg/m  He looks well Cardiac exam shows a regular rate and rhythm with normal heart sounds. There is no murmur Lungs are clear There is no peripheral edema    Diagnostic Tests:  CLINICAL DATA:  F/u TAAFormer smoker, Prev in pacs, Cough due to allergies, Hx of pacemaker, No hx of cancer  EXAM: CT ANGIOGRAPHY CHEST WITH CONTRAST  TECHNIQUE: Multidetector CT imaging of the chest was performed using the standard protocol during bolus administration of intravenous contrast. Multiplanar CT image reconstructions and MIPs were obtained to evaluate the vascular anatomy.  CONTRAST:  iopamidol (ISOVUE-370) 76 % injection 75 mL  Creatinine  was obtained on site at [facility] at [address]  Results: Creatinine 1.4 mg/dL.   Bun-  27 gfr-  48  COMPARISON:  07/15/2016 and previous back to 07/19/2013  FINDINGS: Cardiovascular: Left subclavian transvenous pacemaker stable. Coronary calcifications. Heart size normal. Trace pericardial effusion. Thoracic aortic aneurysm with maximum transverse dimensions as follows:  4.3 cm sinuses of Valsalva  3.8 cm sino-tubular junction  4.6 cm mid ascending (previously 4.8)  4.4 cm distal ascending/proximal arch  2.9 cm distal arch  3.4 cm proximal descending.  The distal descending in visualized proximal abdominal segments are normal in caliber. Mild scattered calcified plaque throughout the arch and descending segment. No dissection or stenosis. Classic 3 vessel brachiocephalic arterial origin anatomy without proximal stenosis.  Mediastinum/Nodes: No enlarged mediastinal, hilar, or axillary lymph nodes. Thyroid gland, trachea, and esophagus demonstrate no significant findings.  Lungs/Pleura: 0.7 cm intrathoracic lymph node adjacent to left major fissure image 64/4, stable since 07/19/2013 consistent with benignity. 0.4 cm subpleural nodule, lateral right upper lobe image 63/4, also stable since 07/19/2013. No new nodule or infiltrate. No pleural effusion. No pneumothorax.  Upper Abdomen: Chronic right renal cystic lesion, incompletely visualized. Left nephrolithiasis without hydronephrosis. No acute findings.  Musculoskeletal: Cervical fixation hardware, incompletely visualized. Anterior vertebral endplate spurring at multiple levels in the mid and lower thoracic spine. No fracture or worrisome bone lesion.  Review of the MIP images confirms the above findings.  IMPRESSION: 1. Ascending aortic aneurysm 4.6 cm, without interval enlargement or complicating features. 2. Coronary and Aortic Atherosclerosis (ICD10-170.0) 3. Left  nephrolithiasis.   Electronically Signed   By: Lucrezia Europe M.D.   On: 07/21/2017 10:19   Impression:  He has a stable 4.6 cm fusiform ascending aortic aneurysm. The measurements have varied from 4.9 cm to 4.6 cm, probably due to technique. His BP is under good control and he is on a beta blocker. This does not require surgical treatment but should be followed. He plans to continue losing weight with the goal of 170 lbs by next October. He and his wife both seem motivated to live a healthy lifestyle. I reviewed the CT images with them and answered their questions.  Plan:  I will see him back in one year with a CT of the chest without contrast.  I spent 15 minutes performing this established patient evaluation and > 50% of this time was spent face to face counseling and coordinating the care of this patient's aortic aneurysm.    Gaye Pollack, MD [facility] (  [phone]      

## 2017-07-26 ENCOUNTER — Telehealth: Payer: Self-pay | Admitting: Cardiology

## 2017-07-26 ENCOUNTER — Ambulatory Visit (INDEPENDENT_AMBULATORY_CARE_PROVIDER_SITE_OTHER): Payer: Medicare Other | Admitting: *Deleted

## 2017-07-26 DIAGNOSIS — I495 Sick sinus syndrome: Secondary | ICD-10-CM | POA: Diagnosis not present

## 2017-07-26 NOTE — Telephone Encounter (Signed)
LMOVM reminding pt to send remote transmission.   

## 2017-07-27 LAB — CUP PACEART REMOTE DEVICE CHECK
Battery Remaining Longevity: 62 mo
Battery Voltage: 2.99 V
Brady Statistic AS VP Percent: 0.01 %
Brady Statistic RA Percent Paced: 94.72 %
Date Time Interrogation Session: 20181029222158
Implantable Lead Implant Date: 20140523
Implantable Lead Location: 753860
Implantable Lead Model: 5076
Lead Channel Impedance Value: 361 Ohm
Lead Channel Impedance Value: 437 Ohm
Lead Channel Pacing Threshold Amplitude: 0.625 V
Lead Channel Pacing Threshold Pulse Width: 0.4 ms
Lead Channel Pacing Threshold Pulse Width: 0.4 ms
Lead Channel Setting Pacing Amplitude: 2 V
Lead Channel Setting Sensing Sensitivity: 4 mV
MDC IDC LEAD IMPLANT DT: 20140523
MDC IDC LEAD LOCATION: 753859
MDC IDC MSMT LEADCHNL RA IMPEDANCE VALUE: 475 Ohm
MDC IDC MSMT LEADCHNL RA SENSING INTR AMPL: 1.5 mV
MDC IDC MSMT LEADCHNL RA SENSING INTR AMPL: 1.5 mV
MDC IDC MSMT LEADCHNL RV IMPEDANCE VALUE: 494 Ohm
MDC IDC MSMT LEADCHNL RV PACING THRESHOLD AMPLITUDE: 0.875 V
MDC IDC MSMT LEADCHNL RV SENSING INTR AMPL: 9.25 mV
MDC IDC MSMT LEADCHNL RV SENSING INTR AMPL: 9.25 mV
MDC IDC PG IMPLANT DT: 20140523
MDC IDC SET LEADCHNL RV PACING AMPLITUDE: 2.5 V
MDC IDC SET LEADCHNL RV PACING PULSEWIDTH: 0.4 ms
MDC IDC STAT BRADY AP VP PERCENT: 0.07 %
MDC IDC STAT BRADY AP VS PERCENT: 94.85 %
MDC IDC STAT BRADY AS VS PERCENT: 5.08 %
MDC IDC STAT BRADY RV PERCENT PACED: 0.09 %

## 2017-07-27 NOTE — Progress Notes (Signed)
Remote pacemaker transmission.   

## 2017-07-30 ENCOUNTER — Other Ambulatory Visit: Payer: Self-pay | Admitting: Internal Medicine

## 2017-07-30 ENCOUNTER — Encounter: Payer: Self-pay | Admitting: Internal Medicine

## 2017-08-03 ENCOUNTER — Encounter: Payer: Self-pay | Admitting: Cardiology

## 2017-08-13 ENCOUNTER — Ambulatory Visit (INDEPENDENT_AMBULATORY_CARE_PROVIDER_SITE_OTHER): Payer: Medicare Other | Admitting: Internal Medicine

## 2017-08-13 ENCOUNTER — Encounter: Payer: Self-pay | Admitting: Internal Medicine

## 2017-08-13 VITALS — BP 138/78 | HR 65

## 2017-08-13 DIAGNOSIS — I495 Sick sinus syndrome: Secondary | ICD-10-CM | POA: Diagnosis not present

## 2017-08-13 DIAGNOSIS — I480 Paroxysmal atrial fibrillation: Secondary | ICD-10-CM

## 2017-08-13 DIAGNOSIS — Z950 Presence of cardiac pacemaker: Secondary | ICD-10-CM

## 2017-08-13 DIAGNOSIS — I5030 Unspecified diastolic (congestive) heart failure: Secondary | ICD-10-CM

## 2017-08-13 LAB — CUP PACEART INCLINIC DEVICE CHECK
Brady Statistic AP VP Percent: 0.08 %
Brady Statistic AS VP Percent: 0.01 %
Brady Statistic RA Percent Paced: 91.94 %
Brady Statistic RV Percent Paced: 0.1 %
Date Time Interrogation Session: 20181116101043
Implantable Lead Implant Date: 20140523
Implantable Lead Location: 753859
Implantable Lead Model: 5076
Lead Channel Impedance Value: 361 Ohm
Lead Channel Impedance Value: 437 Ohm
Lead Channel Pacing Threshold Pulse Width: 0.4 ms
Lead Channel Sensing Intrinsic Amplitude: 11.375 mV
Lead Channel Setting Pacing Amplitude: 2 V
Lead Channel Setting Pacing Pulse Width: 0.4 ms
MDC IDC LEAD IMPLANT DT: 20140523
MDC IDC LEAD LOCATION: 753860
MDC IDC MSMT BATTERY REMAINING LONGEVITY: 62 mo
MDC IDC MSMT BATTERY VOLTAGE: 2.99 V
MDC IDC MSMT LEADCHNL RA IMPEDANCE VALUE: 475 Ohm
MDC IDC MSMT LEADCHNL RA PACING THRESHOLD AMPLITUDE: 0.75 V
MDC IDC MSMT LEADCHNL RA SENSING INTR AMPL: 1.5 mV
MDC IDC MSMT LEADCHNL RA SENSING INTR AMPL: 1.75 mV
MDC IDC MSMT LEADCHNL RV IMPEDANCE VALUE: 494 Ohm
MDC IDC MSMT LEADCHNL RV PACING THRESHOLD AMPLITUDE: 1 V
MDC IDC MSMT LEADCHNL RV PACING THRESHOLD PULSEWIDTH: 0.4 ms
MDC IDC MSMT LEADCHNL RV SENSING INTR AMPL: 8.875 mV
MDC IDC PG IMPLANT DT: 20140523
MDC IDC SET LEADCHNL RV PACING AMPLITUDE: 2.5 V
MDC IDC SET LEADCHNL RV SENSING SENSITIVITY: 4 mV
MDC IDC STAT BRADY AP VS PERCENT: 92.09 %
MDC IDC STAT BRADY AS VS PERCENT: 7.83 %

## 2017-08-13 NOTE — Patient Instructions (Signed)
Your physician recommends that you continue on your current medications as directed. Please refer to the Current Medication list given to you today.  Your physician wants you to follow-up in : [place] NP [place] will receive a reminder letter in the mail two months in advance. If you don't receive a letter, please call our office to schedule the follow-up appointment.

## 2017-08-13 NOTE — Progress Notes (Signed)
skf      Patient Care Team: [name], MD as PCP - General (Internal Medicine) Jettie Booze, MD (Cardiology)   HPI  Don Jacobs is a 81 y.o. male Seen following pacemaker implantation 5/14 for paroxysmal atrial fibrillation with a rapid rate associated with symptomatic post termination pausing and presyncope. He also has underlying bradycardia.  He has a nonischemic cardiomyopathy with left bundle branch block  He is on dofetilide  He previously had taking amiodarone and flecainide he did not tolerate. He was started most recently on dofetilide-4/16 which she has tolerated so far. He saw Dr. Greggory Brandy 6/16 with the decision to continue medical therapy with a low threshold for proceeding with catheter ablation  He has been holding sinus rhythm and has felt really exceptionally good.  He has no complaints of chest pain dyspnea shortness of breath or peripheral edema  No issues of bleeding on his Eliquis    DATE TEST    2010 Cath  [facility]  3/16 myoview   no ischemic   3/16  Echo   EF 40-/45 %   9/17 Echo   EF 25-30 %   10/17 Mr. stenosis.  CBC today simply Cath EF 35-40% CA normal      Date Cr K Mg Hgb  9/17 1.4 3.9  12.3  8/18 1.43 4.4 1.8      His CHADS-VASc score is 4, age-31, hypertension-1, and vascular disease- 1.     Past Medical History:  Diagnosis Date  . Allergic rhinitis   . Aortic aneurysm, thoracic ([place]) 05/25/2011  . BPH (benign prostatic hyperplasia)   . Chronic systolic CHF (congestive heart failure) ([place])   . Dizziness and giddiness 06/04/2015  . ED (erectile dysfunction)   . Gout   . Hearing loss    uses hearing a cyst  . Hx of migraines   . Hypertension   . Hypothyroidism   . Persistent atrial fibrillation ([place])   . Prostatitis, chronic   . Seborrheic keratosis   . Sick sinus syndrome ([facility])    a. s/p PPM   . Sleep apnea   . Spondylolisthesis 07/2010    Past Surgical History:  Procedure Laterality Date  . ANTERIOR CERVICAL  DECOMP/DISCECTOMY FUSION  10/2000   "C4, 5, 6 w/fusion" (02/17/2013)  . BACK SURGERY    . CARDIAC CATHETERIZATION  2000's   "once" (02/17/2013)  . CARDIOVERSION N/A 01/17/2015   Performed by Larey Dresser, MD at [facility]  . CARDIOVERSION N/A 04/09/2014   Performed by Lelon Perla, MD at [facility]  . FOOT SURGERY Right ~ 2007   "reconstruction" (02/17/2013)  . INSERT / REPLACE / REMOVE PACEMAKER  02/17/2013  . KNEE ARTHROSCOPY Left ~ 2007  . L5 selective nerve root block     Dr Nelva Bush  . LUMBAR SPINE SURGERY  2012   "bone graft, Dr. [name]" (02/17/2013)  . PERMANENT PACEMAKER INSERTION N/A 02/17/2013   Performed by Deboraha Sprang, MD at [facility] CATH LAB  . REPAIR PERONEAL TENDONS ANKLE Right 10/2003  . Right/Left Heart Cath and Coronary Angiography N/A 07/23/2016   Performed by Jettie Booze, MD at [facility]  . SHOULDER OPEN ROTATOR CUFF REPAIR Right 09/2007; 2013    Current Outpatient Medications  Medication Sig Dispense Refill  . acetaminophen (TYLENOL) 500 MG tablet Take 500 mg by mouth every 6 (six) hours as needed (for pain.).    Marland Kitchen allopurinol (ZYLOPRIM) 300 MG tablet Take 300 mg  by mouth at bedtime.     Marland Kitchen apixaban (ELIQUIS) 5 MG TABS tablet Take 1 tablet (5 mg total) by mouth 2 (two) times daily. 180 tablet 3  . Calcium Carbonate (CALTRATE 600 PO) Take 600 mg by mouth at bedtime.     . carvedilol (COREG) 3.125 MG tablet TAKE 1 TABLET BY MOUTH TWICE DAILY 60 tablet 6  . Cholecalciferol (D 2000) 2000 units TABS Take 2,000 Units by mouth every evening.     . Cinnamon 500 MG capsule Take 500 mg by mouth every evening.     . colchicine 0.6 MG tablet Take 0.6 mg by mouth daily as needed (gout).     . cyanocobalamin 500 MCG tablet Take 500 mcg by mouth every evening.     . dofetilide (TIKOSYN) 250 MCG capsule Take 1 capsule (250 mcg total) by mouth 2 (two) times daily. 180 capsule 1  . furosemide (LASIX) 40 MG tablet TAKE 1 TABLET BY MOUTH DAILY. 90 tablet 1  .  guaiFENesin (ROBITUSSIN) 100 MG/5ML SOLN Take 10 mLs by mouth every 4 (four) hours as needed for cough or to loosen phlegm.    Marland Kitchen levothyroxine (SYNTHROID, LEVOTHROID) 112 MCG tablet Take 1 tablet (112 mcg total) by mouth daily before breakfast. 30 tablet 3  . lisinopril (PRINIVIL,ZESTRIL) 5 MG tablet Take 0.5 tablets (2.5 mg total) by mouth daily. 30 tablet 3  . loratadine (CLARITIN) 10 MG tablet Take 5 mg by mouth 2 (two) times daily as needed for allergies or rhinitis.     . Magnesium 400 MG CAPS Take 400 mg by mouth 2 (two) times daily. (Patient taking differently: Take 400 mg by mouth daily. )    . Misc Natural Products (SAW PALMETTO) CAPS Take 1 capsule by mouth daily.    Marland Kitchen OVER THE COUNTER MEDICATION Take 1 capsule by mouth every evening. [place]cad[place] Grape Seed Extract    . polyvinyl alcohol (LIQUIFILM TEARS) 1.4 % ophthalmic solution Place 1 drop into both eyes 3 (three) times daily as needed for dry eyes.    . potassium chloride SA (K-DUR,KLOR-CON) 20 MEQ tablet Take 40 mEq 2 (two) times daily by mouth.    . predniSONE (DELTASONE) 10 MG tablet Take 10 mg by mouth daily as needed (GOUT).     . vitamin C (ASCORBIC ACID) 500 MG tablet Take 500 mg by mouth daily.      No current facility-administered medications for this visit.     Allergies  Allergen Reactions  . Penicillins Shortness Of Breath and Rash    Ended up at ER after using Has patient had a PCN reaction causing immediate rash, facial/tongue/throat swelling, SOB or lightheadedness with hypotension:Yes Has patient had a PCN reaction causing severe rash involving mucus membranes or skin necrosis:No Has patient had a PCN reaction that required hospitalization:No Has patient had a PCN reaction occurring within the last 10 years:No If all of the above answers are "NO", then may proceed with Cephalosporin use.   Marland Kitchen Oxycodone Other (See Comments)    Altered mental status    Review of Systems negative except from HPI and  PMH  Physical Exam BP 138/78   Pulse 65  Well developed and nourished in no acute distress HENT normal Neck supple with JVP-flat Clear Regular rate and rhythm, no murmurs or gallops Abd-soft with active BS No Clubbing cyanosis edema Skin-warm and dry A & Oriented  Grossly normal sensory and motor function  ECG Personally reviewed  Apacing\   Assessment and  Plan\ Sinus node dysfunction  Atrial fibrillation-paroxysmal  Cardiomyopathy  Left bundle branch block  First-degree AV block  HFrEF  High Risk Medication Surveillance    Obstructive sleep apnea-treated  Pacemaker-[employer]tronic The patient's device was interrogated.  The information was reviewed. No changes were made in the programming.     Euvolemic continue current meds  No significant atrial fibrillation   On Anticoagulation;  No bleeding issues   Will check survillance labs

## 2017-09-01 ENCOUNTER — Telehealth: Payer: Self-pay | Admitting: Internal Medicine

## 2017-09-01 DIAGNOSIS — M1712 Unilateral primary osteoarthritis, left knee: Secondary | ICD-10-CM | POA: Diagnosis not present

## 2017-09-01 NOTE — Telephone Encounter (Signed)
Walk in pt Form-Sealed enveloped dropped off by patient placed in [place].

## 2017-09-22 DIAGNOSIS — G4730 Sleep apnea, unspecified: Secondary | ICD-10-CM | POA: Insufficient documentation

## 2017-09-22 DIAGNOSIS — I495 Sick sinus syndrome: Secondary | ICD-10-CM | POA: Insufficient documentation

## 2017-09-22 DIAGNOSIS — Z8669 Personal history of other diseases of the nervous system and sense organs: Secondary | ICD-10-CM | POA: Insufficient documentation

## 2017-09-22 DIAGNOSIS — I4819 Other persistent atrial fibrillation: Secondary | ICD-10-CM | POA: Insufficient documentation

## 2017-09-27 ENCOUNTER — Encounter: Payer: Self-pay | Admitting: Internal Medicine

## 2017-09-27 ENCOUNTER — Telehealth: Payer: Self-pay | Admitting: Internal Medicine

## 2017-09-27 ENCOUNTER — Ambulatory Visit (INDEPENDENT_AMBULATORY_CARE_PROVIDER_SITE_OTHER): Payer: Medicare Other | Admitting: Internal Medicine

## 2017-09-27 VITALS — BP 120/68 | HR 70 | Ht 69.0 in | Wt 226.8 lb

## 2017-09-27 DIAGNOSIS — Z87891 Personal history of nicotine dependence: Secondary | ICD-10-CM

## 2017-09-27 DIAGNOSIS — J069 Acute upper respiratory infection, unspecified: Secondary | ICD-10-CM | POA: Diagnosis not present

## 2017-09-27 DIAGNOSIS — J209 Acute bronchitis, unspecified: Secondary | ICD-10-CM | POA: Diagnosis not present

## 2017-09-27 DIAGNOSIS — G4733 Obstructive sleep apnea (adult) (pediatric): Secondary | ICD-10-CM

## 2017-09-27 MED ORDER — BENZONATATE 200 MG PO CAPS: 200.0000 mg | ORAL_CAPSULE | Freq: Three times a day (TID) | ORAL | 1 refills | Status: DC | PRN

## 2017-09-27 MED ORDER — METHYLPREDNISOLONE ACETATE 80 MG/ML IJ SUSP
80.0000 mg | Freq: Once | INTRAMUSCULAR | Status: AC
  Administered 2017-09-27: 80 mg via INTRAMUSCULAR

## 2017-09-27 MED ORDER — BUDESONIDE-FORMOTEROL FUMARATE 160-4.5 MCG/ACT IN AERO: 2.0000 | INHALATION_SPRAY | Freq: Two times a day (BID) | RESPIRATORY_TRACT | 0 refills | Status: DC

## 2017-09-27 MED ORDER — PHENYLEPHRINE HCL 1 % NA SOLN
3.0000 [drp] | Freq: Once | NASAL | Status: AC
  Administered 2017-09-27: 3 [drp] via NASAL

## 2017-09-27 MED ORDER — BUDESONIDE-FORMOTEROL FUMARATE 160-4.5 MCG/ACT IN AERO: INHALATION_SPRAY | RESPIRATORY_TRACT | 6 refills | Status: DC

## 2017-09-27 MED ORDER — DOXYCYCLINE HYCLATE 100 MG PO TABS: ORAL_TABLET | ORAL | 0 refills | Status: DC

## 2017-09-27 NOTE — Progress Notes (Signed)
Patient ID: Don Jacobs, male    DOB: 19-Sep-1936, 81 y.o.   MRN: [id number] M former smoker followed for OSA,hypoxia complicated by A. Fib/ pacemaker, HBP, hypothyroid, aortic aneurysm   ------------------------------------------------------- 09/25/2016-82 year old male former smoker followed for OSA , complicated by A. fib/pacemaker, HBP, hypothyroid CPAP 8/Advanced FOLLOWS FOR: DME: [facility]. Pt wears CPAP nightly and with naps. DL attached. Pt's CPAP is starting to mess up and was told by [facility] to have [place] [facility] order for new CPAP He depends on CPAP and tries to use it every night. Recently it is been shutting off during the night and had attempted repair by his DME company did not work. Download confirms excellent compliance 96%/4 hour, good control AHI 5.0/hour. Acute sinusitis/bronchitis syndrome with green discharge and low-grade fever was treated by his PCP with Levaquin. Discoloration cleared and now produces minimal sputum but has persistent annoying cough.  09/27/17- 81 year old male former smoker followed for OSA , complicated by A. fib/pacemaker, HBP, hypothyroid, thoracic aortic aneurysm CPAP 10/Advanced ----OSA; DME: [facility]. Pt wears CPAP nightly and DL attached. Pressure works well for patient;sleeps well and feels great during the day. Pt notes he has a head cold today-ears clogged and slight sore throat; cough, sinus pressure. Has used Dayquil and Nyquil, and some left-over prednisone. Download 100% compliance, AHI 5.6/hour.  Very comfortable with CPAP and sleeps better. [name] gave him Advair which costs $140 but does help his breathing.  We discussed alternatives and evaluation-can get PFT and assess.  Former smoker.  Dyspnea on exertion with little cough or wheeze. Today acutely ill with head congestion, productive cough, no fever or purulent sputum.  No myalgias.  ROS-see HPI + = positive Constitutional:   No-   weight loss, night sweats, fevers, chills, +fatigue, lassitude. HEENT:    No-  headaches, difficulty swallowing, tooth/dental problems, sore throat,       No-  sneezing, itching, ear ache, time nasal congestion, post nasal drip,  CV:  No-   chest pain, orthopnea, PND, swelling in lower extremities, anasarca, dizziness, palpitations Resp: + Shortness of breath with exertion or at rest.              productive cough,  + non-productive cough,  No- coughing up of blood.              No-   change in color of mucus.  No- wheezing.   Skin: No-   rash or lesions. GI:  No-   heartburn, indigestion, abdominal pain, nausea, vomiting,  GU:  MS:  No-   joint pain or swelling.   Neuro-     nothing unusual Psych:  No- change in mood or affect. No depression or anxiety.  No memory loss.  OBJ- Physical Exam General- Alert, Oriented, Affect-appropriate, Distress- none acute, + overweight Skin- rash-none, lesions- none, excoriation- none Lymphadenopathy- none Head- atraumatic            Eyes- Gross vision intact, PERRLA, conjunctivae and secretions clear            Ears- + Hearing aid            Nose-+ turbinate edema, no-Septal dev, mucus +, polyps, erosion, perforation             Throat- Mallampati III-IV , mucosa clear , drainage- none, tonsils- atrophic. dentures Neck- flexible , trachea midline, no stridor , thyroid nl, carotid no bruit Chest - symmetrical excursion , unlabored  Heart/CV- RRR/ paced, no murmur , no gallop  , no rub, nl s1 s2                           - JVD- none , edema- none, stasis changes- none, varices- none           Lung- , wheeze- none, cough, dullness-none, rub- none           Chest wall-  +L pacemaker Abd-  Br/ Gen/ Rectal- Not done, not indicated Extrem- cyanosis- none, clubbing, none, atrophy- none, strength- nl Neuro- grossly intact to observation

## 2017-09-27 NOTE — Telephone Encounter (Signed)
Spoke with pt's wife-aware that Rx has been sent to pharmacy. Nothing more needed at this time.

## 2017-09-27 NOTE — Patient Instructions (Addendum)
We can continue CPAP 10, mask of choice, humidifier, supplies, AirView  Dx OSA  Order- neb neo nasal     Dx acute upper respiratory infection              Depo 80  Sample and Script printed Symbicort 160       Inhale 2 puffs, then rinse mouth well, twice daily  Script printed for doxycycline antibiotic      Order schedule PFT    Dx former tobacco user

## 2017-09-28 NOTE — Assessment & Plan Note (Signed)
He feels better using CPAP and download confirms good compliance and control.  We will continue at 10 CWP

## 2017-09-28 NOTE — Assessment & Plan Note (Addendum)
Acute upper respiratory infection with tracheobronchitis.  Advair has helped.  Question if we can help him at lower cost with Symbicort.  Also question how much underlying COPD this former smoker has. Plan-doxycycline, benzonatate Perles, Symbicort, schedule PFT, nebulizer treatment, Depo-Medrol with discussion

## 2017-10-11 NOTE — Progress Notes (Signed)
Cardiology Office Note   Date:  10/13/2017   ID:  Don Jacobs, DOB 1935/12/05, MRN [id number]  PCP:  [name], MD    No chief complaint on file.  AFib  Wt Readings from Last 3 Encounters:  10/13/17 227 lb 6.4 oz (103.1 kg)  09/27/17 226 lb 12.8 oz (102.9 kg)  07/21/17 213 lb (96.6 kg)       History of Present Illness: Don Jacobs is a 82 y.o. male  who has had symptomatic atrial fibrillation and tachybradycardia syndrome. He has undergone pacemaker placement. His history was complicated by a fall and head trauma including a subdural hematoma a few years ago. He has been cleared for anticoagulation due to the atrial fibrillation.  He had been in atrial fibrillation, but due to his symptoms he was referred to electrophysiology. Dofetilide has been very helpful in maintaining sinus rhythm.   Due to shortness of breath, here was concern as to whether he needed his pacemaker upgraded to a biventricular device. He had a clean heart cath in his ejection fraction was better by ventriculogram than it was by echocardiogram.  Denies : Chest pain. Dizziness. Leg edema. Nitroglycerin use. Orthopnea. Palpitations. Paroxysmal nocturnal dyspnea. Shortness of breath. Syncope.   Ride a stationary bike.  Feels well with that activity.    No bleeding problems.  Has chronic left knee swelling.  He gets it drained.    Past Medical History:  Diagnosis Date  . Allergic rhinitis   . Aortic aneurysm, thoracic ([place]) 05/25/2011  . BPH (benign prostatic hyperplasia)   . Chronic systolic CHF (congestive heart failure) ([place])   . Dizziness and giddiness 06/04/2015  . ED (erectile dysfunction)   . Gout   . Hearing loss    uses hearing a cyst  . Hx of migraines   . Hypertension   . Hypothyroidism   . Persistent atrial fibrillation ([place])   . Prostatitis, chronic   . Seborrheic keratosis   . Sick sinus syndrome ([facility])    a. s/p PPM   . Sleep apnea   . Spondylolisthesis 07/2010     Past Surgical History:  Procedure Laterality Date  . ANTERIOR CERVICAL DECOMP/DISCECTOMY FUSION  10/2000   "C4, 5, 6 w/fusion" (02/17/2013)  . BACK SURGERY    . CARDIAC CATHETERIZATION  2000's   "once" (02/17/2013)  . CARDIAC CATHETERIZATION N/A 07/23/2016   Procedure: Right/Left Heart Cath and Coronary Angiography;  Surgeon: Jettie Booze, MD;  Location: [facility] LAB;  Service: Cardiovascular;  Laterality: N/A;  . CARDIOVERSION N/A 04/09/2014   Procedure: CARDIOVERSION;  Surgeon: Lelon Perla, MD;  Location: [facility] ENDOSCOPY;  Service: Cardiovascular;  Laterality: N/A;  . CARDIOVERSION N/A 01/17/2015   Procedure: CARDIOVERSION;  Surgeon: Larey Dresser, MD;  Location: [facility] ENDOSCOPY;  Service: Cardiovascular;  Laterality: N/A;  . FOOT SURGERY Right ~ 2007   "reconstruction" (02/17/2013)  . INSERT / REPLACE / REMOVE PACEMAKER  02/17/2013  . KNEE ARTHROSCOPY Left ~ 2007  . L5 selective nerve root block     Dr Nelva Bush  . LUMBAR SPINE SURGERY  2012   "bone graft, Dr. [name]" (02/17/2013)  . PERMANENT PACEMAKER INSERTION N/A 02/17/2013   Procedure: PERMANENT PACEMAKER INSERTION;  Surgeon: Deboraha Sprang, MD;  Location: [facility] CATH LAB;  Service: Cardiovascular;  Laterality: N/A;  . REPAIR PERONEAL TENDONS ANKLE Right 10/2003  . SHOULDER OPEN ROTATOR CUFF REPAIR Right 09/2007; 2013     Current Outpatient Medications  Medication Sig Dispense  Refill  . acetaminophen (TYLENOL) 500 MG tablet Take 500 mg by mouth every 6 (six) hours as needed (for pain.).    Marland Kitchen allopurinol (ZYLOPRIM) 300 MG tablet Take 300 mg by mouth at bedtime.     Marland Kitchen apixaban (ELIQUIS) 5 MG TABS tablet Take 1 tablet (5 mg total) by mouth 2 (two) times daily. 180 tablet 3  . benzonatate (TESSALON) 200 MG capsule Take 1 capsule (200 mg total) by mouth 3 (three) times daily as needed for cough. 30 capsule 1  . budesonide-formoterol (SYMBICORT) 160-4.5 MCG/ACT inhaler Inhale 2 puffs then rinse mouth, twice daily 1 Inhaler 6   . budesonide-formoterol (SYMBICORT) 160-4.5 MCG/ACT inhaler Inhale 2 puffs into the lungs 2 (two) times daily. 1 Inhaler 0  . Calcium Carbonate (CALTRATE 600 PO) Take 600 mg by mouth at bedtime.     . carvedilol (COREG) 3.125 MG tablet TAKE 1 TABLET BY MOUTH TWICE DAILY 60 tablet 6  . Cholecalciferol (D 2000) 2000 units TABS Take 2,000 Units by mouth every evening.     . Cinnamon 500 MG capsule Take 500 mg by mouth every evening.     . colchicine 0.6 MG tablet Take 0.6 mg by mouth daily as needed (gout).     . cyanocobalamin 500 MCG tablet Take 500 mcg by mouth every evening.     . dofetilide (TIKOSYN) 250 MCG capsule Take 1 capsule (250 mcg total) by mouth 2 (two) times daily. 180 capsule 1  . doxycycline (VIBRA-TABS) 100 MG tablet 2  Today then one daily 8 tablet 0  . furosemide (LASIX) 40 MG tablet TAKE 1 TABLET BY MOUTH DAILY. 90 tablet 1  . guaiFENesin (ROBITUSSIN) 100 MG/5ML SOLN Take 10 mLs by mouth every 4 (four) hours as needed for cough or to loosen phlegm.    Marland Kitchen levothyroxine (SYNTHROID, LEVOTHROID) 112 MCG tablet Take 1 tablet (112 mcg total) by mouth daily before breakfast. 30 tablet 3  . lisinopril (PRINIVIL,ZESTRIL) 5 MG tablet Take 0.5 tablets (2.5 mg total) by mouth daily. 30 tablet 3  . loratadine (CLARITIN) 10 MG tablet Take 5 mg by mouth 2 (two) times daily as needed for allergies or rhinitis.     . Magnesium 400 MG CAPS Take 400 mg by mouth 2 (two) times daily. (Patient taking differently: Take 400 mg by mouth daily. )    . Misc Natural Products (SAW PALMETTO) CAPS Take 1 capsule by mouth daily.    Marland Kitchen OVER THE COUNTER MEDICATION Take 1 capsule by mouth every evening. [place] Grape Seed Extract    . polyvinyl alcohol (LIQUIFILM TEARS) 1.4 % ophthalmic solution Place 1 drop into both eyes 3 (three) times daily as needed for dry eyes.    . potassium chloride SA (K-DUR,KLOR-CON) 20 MEQ tablet Take 40 mEq 2 (two) times daily by mouth.    . predniSONE (DELTASONE) 10 MG tablet Take  10 mg by mouth daily as needed (GOUT).     . vitamin C (ASCORBIC ACID) 500 MG tablet Take 500 mg by mouth daily.      No current facility-administered medications for this visit.     Allergies:   Penicillins and Oxycodone    Social History:  The patient  reports that he quit smoking about 36 years ago. His smoking use included cigarettes. He has a 12.00 pack-year smoking history. he has never used smokeless tobacco. He reports that he does not drink alcohol or use drugs.   Family History:  The patient's family history includes Alcohol  abuse in his maternal grandfather and maternal grandmother; Emphysema in his mother; Heart attack in his father and paternal grandfather; Heart disease in his father and paternal grandfather; Rheum arthritis in his brother.    ROS:  Please see the history of present illness.   Otherwise, review of systems are positive for knee pain.   All other systems are reviewed and negative.    PHYSICAL EXAM: VS:  BP 128/68   Pulse 84   Ht 5\' 9"  (1.753 m)   Wt 227 lb 6.4 oz (103.1 kg)   SpO2 97%   BMI 33.58 kg/m  , BMI Body mass index is 33.58 kg/m. GEN: Well nourished, well developed, in no acute distress  HEENT: normal  Neck: no JVD, carotid bruits, or masses Cardiac: RRR; no murmurs, rubs, or gallops,no edema  Respiratory:  clear to auscultation bilaterally, normal work of breathing GI: soft, nontender, nondistended, + BS MS: no deformity or atrophy  Skin: warm and dry, no rash Neuro:  Strength and sensation are intact Psych: euthymic mood, full affect      Recent Labs: 05/25/2017: BUN 23; Creatinine, Ser 1.43; Magnesium 1.8; Potassium 4.4; Sodium 141   Lipid Panel No results found for: CHOL, TRIG, HDL, CHOLHDL, VLDL, LDLCALC, LDLDIRECT   Other studies Reviewed: Additional studies/ records that were reviewed today with results demonstrating:LDL 105, HDL 46.  Potassium 4.7, Cr 1.32 in 10/18.   ASSESSMENT AND PLAN:  1. Chronic systolic heart  failure/NICM: Appears euvolemic. EF 40% by cath.  Stamina improved.  Low-dose lisinopril and low-dose carvedilol. 2. AFib: Maintaining NSR on Tikosyn.  Followed by EP. 3. Cardiac cath showed no significant CAD in 2017. 4. Aortic aneurysm: Followed with CT surgeon, Dr. Cyndia Bent.  Max diameter 4.6 cm  5. Anticoagulated: No bleeding problems.  COntinue ELiquis.    Current medicines are reviewed at length with the patient today.  The patient concerns regarding his medicines were addressed.  The following changes have been made:  No change  Labs/ tests ordered today include:  No orders of the defined types were placed in this encounter.   Recommend 150 minutes/week of aerobic exercise Low fat, low carb, high fiber diet recommended  Disposition:   FU in 1 year   Signed, Larae Grooms, MD  10/13/2017 1:38 PM    [facility] [place], [place], [place]  [address] Phone: [phone]; Fax: [phone]

## 2017-10-13 ENCOUNTER — Encounter: Payer: Self-pay | Admitting: Interventional Cardiology

## 2017-10-13 ENCOUNTER — Encounter (INDEPENDENT_AMBULATORY_CARE_PROVIDER_SITE_OTHER): Payer: Self-pay

## 2017-10-13 ENCOUNTER — Ambulatory Visit (INDEPENDENT_AMBULATORY_CARE_PROVIDER_SITE_OTHER): Payer: Medicare Other | Admitting: Interventional Cardiology

## 2017-10-13 VITALS — BP 128/68 | HR 84 | Ht 69.0 in | Wt 227.4 lb

## 2017-10-13 DIAGNOSIS — I428 Other cardiomyopathies: Secondary | ICD-10-CM

## 2017-10-13 DIAGNOSIS — I5022 Chronic systolic (congestive) heart failure: Secondary | ICD-10-CM

## 2017-10-13 DIAGNOSIS — Z7901 Long term (current) use of anticoagulants: Secondary | ICD-10-CM | POA: Diagnosis not present

## 2017-10-13 DIAGNOSIS — I480 Paroxysmal atrial fibrillation: Secondary | ICD-10-CM | POA: Diagnosis not present

## 2017-10-13 NOTE — Patient Instructions (Signed)

## 2017-10-20 DIAGNOSIS — L603 Nail dystrophy: Secondary | ICD-10-CM | POA: Diagnosis not present

## 2017-10-20 DIAGNOSIS — M2042 Other hammer toe(s) (acquired), left foot: Secondary | ICD-10-CM | POA: Diagnosis not present

## 2017-10-21 DIAGNOSIS — L570 Actinic keratosis: Secondary | ICD-10-CM | POA: Diagnosis not present

## 2017-10-21 DIAGNOSIS — L820 Inflamed seborrheic keratosis: Secondary | ICD-10-CM | POA: Diagnosis not present

## 2017-10-21 DIAGNOSIS — L821 Other seborrheic keratosis: Secondary | ICD-10-CM | POA: Diagnosis not present

## 2017-10-25 ENCOUNTER — Ambulatory Visit (INDEPENDENT_AMBULATORY_CARE_PROVIDER_SITE_OTHER): Payer: Medicare Other | Admitting: *Deleted

## 2017-10-25 DIAGNOSIS — I495 Sick sinus syndrome: Secondary | ICD-10-CM | POA: Diagnosis not present

## 2017-10-26 NOTE — Progress Notes (Signed)
Remote pacemaker transmission.   

## 2017-10-27 ENCOUNTER — Encounter: Payer: Self-pay | Admitting: Cardiology

## 2017-10-27 DIAGNOSIS — Z1389 Encounter for screening for other disorder: Secondary | ICD-10-CM | POA: Diagnosis not present

## 2017-10-27 DIAGNOSIS — D81818 Other biotin-dependent carboxylase deficiency: Secondary | ICD-10-CM | POA: Diagnosis not present

## 2017-10-27 DIAGNOSIS — Z0000 Encounter for general adult medical examination without abnormal findings: Secondary | ICD-10-CM | POA: Diagnosis not present

## 2017-10-27 DIAGNOSIS — I10 Essential (primary) hypertension: Secondary | ICD-10-CM | POA: Diagnosis not present

## 2017-10-27 DIAGNOSIS — M109 Gout, unspecified: Secondary | ICD-10-CM | POA: Diagnosis not present

## 2017-10-27 DIAGNOSIS — I719 Aortic aneurysm of unspecified site, without rupture: Secondary | ICD-10-CM | POA: Diagnosis not present

## 2017-10-27 DIAGNOSIS — D696 Thrombocytopenia, unspecified: Secondary | ICD-10-CM | POA: Diagnosis not present

## 2017-10-27 DIAGNOSIS — Z950 Presence of cardiac pacemaker: Secondary | ICD-10-CM | POA: Diagnosis not present

## 2017-10-27 DIAGNOSIS — E039 Hypothyroidism, unspecified: Secondary | ICD-10-CM | POA: Diagnosis not present

## 2017-10-27 DIAGNOSIS — Z79899 Other long term (current) drug therapy: Secondary | ICD-10-CM | POA: Diagnosis not present

## 2017-10-27 DIAGNOSIS — N400 Enlarged prostate without lower urinary tract symptoms: Secondary | ICD-10-CM | POA: Diagnosis not present

## 2017-10-27 DIAGNOSIS — B351 Tinea unguium: Secondary | ICD-10-CM | POA: Diagnosis not present

## 2017-10-27 DIAGNOSIS — K5730 Diverticulosis of large intestine without perforation or abscess without bleeding: Secondary | ICD-10-CM | POA: Diagnosis not present

## 2017-10-27 DIAGNOSIS — E559 Vitamin D deficiency, unspecified: Secondary | ICD-10-CM | POA: Diagnosis not present

## 2017-10-27 LAB — CUP PACEART REMOTE DEVICE CHECK
Battery Voltage: 2.99 V
Brady Statistic AP VP Percent: 0.06 %
Brady Statistic RA Percent Paced: 93.87 %
Brady Statistic RV Percent Paced: 0.2 %
Implantable Lead Implant Date: 20140523
Implantable Lead Location: 753860
Implantable Pulse Generator Implant Date: 20140523
Lead Channel Impedance Value: 456 Ohm
Lead Channel Impedance Value: 494 Ohm
Lead Channel Pacing Threshold Amplitude: 0.5 V
Lead Channel Pacing Threshold Pulse Width: 0.4 ms
Lead Channel Pacing Threshold Pulse Width: 0.4 ms
Lead Channel Sensing Intrinsic Amplitude: 1.375 mV
Lead Channel Setting Pacing Amplitude: 2 V
Lead Channel Setting Pacing Pulse Width: 0.4 ms
Lead Channel Setting Sensing Sensitivity: 4 mV
MDC IDC LEAD IMPLANT DT: 20140523
MDC IDC LEAD LOCATION: 753859
MDC IDC MSMT BATTERY REMAINING LONGEVITY: 54 mo
MDC IDC MSMT LEADCHNL RA IMPEDANCE VALUE: 361 Ohm
MDC IDC MSMT LEADCHNL RA IMPEDANCE VALUE: 456 Ohm
MDC IDC MSMT LEADCHNL RA SENSING INTR AMPL: 1.375 mV
MDC IDC MSMT LEADCHNL RV PACING THRESHOLD AMPLITUDE: 0.875 V
MDC IDC MSMT LEADCHNL RV SENSING INTR AMPL: 8.5 mV
MDC IDC MSMT LEADCHNL RV SENSING INTR AMPL: 8.5 mV
MDC IDC SESS DTM: 20190128181835
MDC IDC SET LEADCHNL RV PACING AMPLITUDE: 2.5 V
MDC IDC STAT BRADY AP VS PERCENT: 95.64 %
MDC IDC STAT BRADY AS VP PERCENT: 0.13 %
MDC IDC STAT BRADY AS VS PERCENT: 4.17 %

## 2017-11-10 DIAGNOSIS — M2042 Other hammer toe(s) (acquired), left foot: Secondary | ICD-10-CM | POA: Diagnosis not present

## 2017-11-10 DIAGNOSIS — L603 Nail dystrophy: Secondary | ICD-10-CM | POA: Diagnosis not present

## 2017-12-23 DIAGNOSIS — M1712 Unilateral primary osteoarthritis, left knee: Secondary | ICD-10-CM | POA: Diagnosis not present

## 2017-12-24 ENCOUNTER — Ambulatory Visit (HOSPITAL_COMMUNITY)
Admission: RE | Admit: 2017-12-24 | Discharge: 2017-12-24 | Disposition: A | Discharge status: Home / Self Care | Payer: Medicare Other | Source: Ambulatory Visit | Attending: Nurse Practitioner | Admitting: Nurse Practitioner

## 2017-12-24 ENCOUNTER — Encounter (HOSPITAL_COMMUNITY): Payer: Self-pay | Admitting: Nurse Practitioner

## 2017-12-24 VITALS — BP 122/68 | HR 68 | Ht 69.0 in | Wt 228.8 lb

## 2017-12-24 DIAGNOSIS — Z981 Arthrodesis status: Secondary | ICD-10-CM | POA: Insufficient documentation

## 2017-12-24 DIAGNOSIS — I480 Paroxysmal atrial fibrillation: Secondary | ICD-10-CM | POA: Diagnosis not present

## 2017-12-24 DIAGNOSIS — G4730 Sleep apnea, unspecified: Secondary | ICD-10-CM | POA: Insufficient documentation

## 2017-12-24 DIAGNOSIS — Z950 Presence of cardiac pacemaker: Secondary | ICD-10-CM | POA: Diagnosis not present

## 2017-12-24 DIAGNOSIS — I509 Heart failure, unspecified: Secondary | ICD-10-CM | POA: Insufficient documentation

## 2017-12-24 DIAGNOSIS — Z79890 Hormone replacement therapy (postmenopausal): Secondary | ICD-10-CM | POA: Insufficient documentation

## 2017-12-24 DIAGNOSIS — L821 Other seborrheic keratosis: Secondary | ICD-10-CM | POA: Diagnosis not present

## 2017-12-24 DIAGNOSIS — I447 Left bundle-branch block, unspecified: Secondary | ICD-10-CM | POA: Insufficient documentation

## 2017-12-24 DIAGNOSIS — Z79899 Other long term (current) drug therapy: Secondary | ICD-10-CM | POA: Diagnosis not present

## 2017-12-24 DIAGNOSIS — N529 Male erectile dysfunction, unspecified: Secondary | ICD-10-CM | POA: Insufficient documentation

## 2017-12-24 DIAGNOSIS — Z87891 Personal history of nicotine dependence: Secondary | ICD-10-CM | POA: Insufficient documentation

## 2017-12-24 DIAGNOSIS — I110 Hypertensive heart disease with heart failure: Secondary | ICD-10-CM | POA: Diagnosis not present

## 2017-12-24 DIAGNOSIS — Z7952 Long term (current) use of systemic steroids: Secondary | ICD-10-CM | POA: Insufficient documentation

## 2017-12-24 DIAGNOSIS — N400 Enlarged prostate without lower urinary tract symptoms: Secondary | ICD-10-CM | POA: Diagnosis not present

## 2017-12-24 DIAGNOSIS — I481 Persistent atrial fibrillation: Secondary | ICD-10-CM | POA: Diagnosis not present

## 2017-12-24 DIAGNOSIS — J309 Allergic rhinitis, unspecified: Secondary | ICD-10-CM | POA: Insufficient documentation

## 2017-12-24 DIAGNOSIS — Z7901 Long term (current) use of anticoagulants: Secondary | ICD-10-CM | POA: Diagnosis not present

## 2017-12-24 DIAGNOSIS — I495 Sick sinus syndrome: Secondary | ICD-10-CM | POA: Insufficient documentation

## 2017-12-24 DIAGNOSIS — M109 Gout, unspecified: Secondary | ICD-10-CM | POA: Diagnosis not present

## 2017-12-24 DIAGNOSIS — E039 Hypothyroidism, unspecified: Secondary | ICD-10-CM | POA: Diagnosis not present

## 2017-12-24 LAB — BASIC METABOLIC PANEL
Anion gap: 11 (ref 5–15)
BUN: 33 mg/dL — AB (ref 6–20)
CHLORIDE: 100 mmol/L — AB (ref 101–111)
CO2: 27 mmol/L (ref 22–32)
CREATININE: 1.33 mg/dL — AB (ref 0.61–1.24)
Calcium: 9.7 mg/dL (ref 8.9–10.3)
GFR calc Af Amer: 57 mL/min — ABNORMAL LOW (ref >60)
GFR calc non Af Amer: 49 mL/min — ABNORMAL LOW (ref >60)
GLUCOSE: 105 mg/dL — AB (ref 65–99)
POTASSIUM: 5.1 mmol/L (ref 3.5–5.1)
SODIUM: 138 mmol/L (ref 135–145)

## 2017-12-24 LAB — MAGNESIUM: Magnesium: 2.1 mg/dL (ref 1.7–2.4)

## 2017-12-24 NOTE — Progress Notes (Signed)
Patient ID: Don Jacobs, male   DOB: 21-Apr-1936, 82 y.o.   MRN: [id number]     Primary Care Physician: [name], MD Referring Physician:Dr. Allred Cardiologist: Dr. Larina Earthly is a 82 y.o. male with a h/o PPM, CHF, persistent afib maintaining SR on tikosyn. Very little breakthrough afib. He called the office last week stating that he had diarrhea for a couple of days and was in afib. In the office today, the diarrhea  has resolved, and he has returned to [place]. Don Jacobs has no complaints.  Today, he denies symptoms of palpitations, chest pain,  orthopnea, PND,  dizziness, presyncope, syncope, or neurologic sequela. MIld dyspnea, mild dizziness. Mild increase in SOB, LLE, weight last week. The patient is tolerating medications without difficulties and is otherwise without complaint today.   Past Medical History:  Diagnosis Date  . Allergic rhinitis   . Aortic aneurysm, thoracic ([place]) 05/25/2011  . BPH (benign prostatic hyperplasia)   . Chronic systolic CHF (congestive heart failure) ([place])   . Dizziness and giddiness 06/04/2015  . ED (erectile dysfunction)   . Gout   . Hearing loss    uses hearing a cyst  . Hx of migraines   . Hypertension   . Hypothyroidism   . Persistent atrial fibrillation ([place])   . Prostatitis, chronic   . Seborrheic keratosis   . Sick sinus syndrome ([facility])    a. s/p PPM   . Sleep apnea   . Spondylolisthesis 07/2010   Past Surgical History:  Procedure Laterality Date  . ANTERIOR CERVICAL DECOMP/DISCECTOMY FUSION  10/2000   "C4, 5, 6 w/fusion" (02/17/2013)  . BACK SURGERY    . CARDIAC CATHETERIZATION  2000's   "once" (02/17/2013)  . CARDIAC CATHETERIZATION N/A 07/23/2016   Procedure: Right/Left Heart Cath and Coronary Angiography;  Surgeon: Jettie Booze, MD;  Location: [place] CV LAB;  Service: Cardiovascular;  Laterality: N/A;  . CARDIOVERSION N/A 04/09/2014   Procedure: CARDIOVERSION;  Surgeon: Lelon Perla, MD;   Location: [facility] ENDOSCOPY;  Service: Cardiovascular;  Laterality: N/A;  . CARDIOVERSION N/A 01/17/2015   Procedure: CARDIOVERSION;  Surgeon: Larey Dresser, MD;  Location: [facility] ENDOSCOPY;  Service: Cardiovascular;  Laterality: N/A;  . FOOT SURGERY Right ~ 2007   "reconstruction" (02/17/2013)  . INSERT / REPLACE / REMOVE PACEMAKER  02/17/2013  . KNEE ARTHROSCOPY Left ~ 2007  . L5 selective nerve root block     Dr Nelva Bush  . LUMBAR SPINE SURGERY  2012   "bone graft, Dr. [name]" (02/17/2013)  . PERMANENT PACEMAKER INSERTION N/A 02/17/2013   Procedure: PERMANENT PACEMAKER INSERTION;  Surgeon: Deboraha Sprang, MD;  Location: [facility] CATH LAB;  Service: Cardiovascular;  Laterality: N/A;  . REPAIR PERONEAL TENDONS ANKLE Right 10/2003  . SHOULDER OPEN ROTATOR CUFF REPAIR Right 09/2007; 2013    Current Outpatient Medications  Medication Sig Dispense Refill  . acetaminophen (TYLENOL) 500 MG tablet Take 500 mg by mouth every 6 (six) hours as needed (for pain.).    Marland Kitchen allopurinol (ZYLOPRIM) 300 MG tablet Take 300 mg by mouth at bedtime.     Marland Kitchen apixaban (ELIQUIS) 5 MG TABS tablet Take 1 tablet (5 mg total) by mouth 2 (two) times daily. 180 tablet 3  . Calcium Carbonate (CALTRATE 600 PO) Take 600 mg by mouth at bedtime.     . carvedilol (COREG) 3.125 MG tablet TAKE 1 TABLET BY MOUTH TWICE DAILY 60 tablet 6  . Cholecalciferol (D 2000) 2000 units TABS Take  2,000 Units by mouth every evening.     . Cinnamon 500 MG capsule Take 500 mg by mouth every evening.     . cyanocobalamin 500 MCG tablet Take 500 mcg by mouth every evening.     . dofetilide (TIKOSYN) 250 MCG capsule Take 1 capsule (250 mcg total) by mouth 2 (two) times daily. 180 capsule 1  . furosemide (LASIX) 40 MG tablet TAKE 1 TABLET BY MOUTH DAILY. 90 tablet 1  . levothyroxine (SYNTHROID, LEVOTHROID) 112 MCG tablet Take 1 tablet (112 mcg total) by mouth daily before breakfast. 30 tablet 3  . lisinopril (PRINIVIL,ZESTRIL) 5 MG tablet Take 0.5 tablets (2.5 mg  total) by mouth daily. 30 tablet 3  . Magnesium 400 MG CAPS Take 400 mg by mouth 2 (two) times daily. (Patient taking differently: Take 400 mg by mouth daily. )    . Misc Natural Products (SAW PALMETTO) CAPS Take 1 capsule by mouth daily.    . NON FORMULARY N4- Greens vegetable capsule    . OVER THE COUNTER MEDICATION Take 1 capsule by mouth every evening. [place] Grape Seed Extract    . polyvinyl alcohol (LIQUIFILM TEARS) 1.4 % ophthalmic solution Place 1 drop into both eyes 3 (three) times daily as needed for dry eyes.    . potassium chloride SA (K-DUR,KLOR-CON) 20 MEQ tablet Take 40 mEq 2 (two) times daily by mouth.    . vitamin C (ASCORBIC ACID) 500 MG tablet Take 500 mg by mouth daily.     . benzonatate (TESSALON) 200 MG capsule Take 1 capsule (200 mg total) by mouth 3 (three) times daily as needed for cough. (Patient not taking: Reported on 12/24/2017) 30 capsule 1  . budesonide-formoterol (SYMBICORT) 160-4.5 MCG/ACT inhaler Inhale 2 puffs then rinse mouth, twice daily (Patient not taking: Reported on 12/24/2017) 1 Inhaler 6  . colchicine 0.6 MG tablet Take 0.6 mg by mouth daily as needed (gout).     Marland Kitchen guaiFENesin (ROBITUSSIN) 100 MG/5ML SOLN Take 10 mLs by mouth every 4 (four) hours as needed for cough or to loosen phlegm.    . loratadine (CLARITIN) 10 MG tablet Take 5 mg by mouth 2 (two) times daily as needed for allergies or rhinitis.     . predniSONE (DELTASONE) 10 MG tablet Take 10 mg by mouth daily as needed (GOUT).      No current facility-administered medications for this encounter.     Allergies  Allergen Reactions  . Penicillins Shortness Of Breath and Rash    Ended up at ER after using Has patient had a PCN reaction causing immediate rash, facial/tongue/throat swelling, SOB or lightheadedness with hypotension:Yes Has patient had a PCN reaction causing severe rash involving mucus membranes or skin necrosis:No Has patient had a PCN reaction that required hospitalization:No Has  patient had a PCN reaction occurring within the last 10 years:No If all of the above answers are "NO", then may proceed with Cephalosporin use.   Marland Kitchen Oxycodone Other (See Comments)    Altered mental status    Social History   Socioeconomic History  . Marital status: Married    Spouse name: Not on file  . Number of children: 2  . Years of education: 64  . Highest education level: Not on file  Occupational History  . Occupation: retired    Fish farm manager: RETIRED  Social Needs  . Financial resource strain: Not on file  . Food insecurity:    Worry: Not on file    Inability: Not on file  .  Transportation needs:    Medical: Not on file    Non-medical: Not on file  Tobacco Use  . Smoking status: Former Smoker    Packs/day: 1.00    Years: 12.00    Pack years: 12.00    Types: Cigarettes    Last attempt to quit: 03/11/1981    Years since quitting: 36.8  . Smokeless tobacco: Never Used  Substance and Sexual Activity  . Alcohol use: No  . Drug use: No  . Sexual activity: Not on file  Lifestyle  . Physical activity:    Days per week: Not on file    Minutes per session: Not on file  . Stress: Not on file  Relationships  . Social connections:    Talks on phone: Not on file    Gets together: Not on file    Attends religious service: Not on file    Active member of club or organization: Not on file    Attends meetings of clubs or organizations: Not on file    Relationship status: Not on file  . Intimate partner violence:    Fear of current or ex partner: Not on file    Emotionally abused: Not on file    Physically abused: Not on file    Forced sexual activity: Not on file  Other Topics Concern  . Not on file  Social History Narrative   Patient does not drink caffeine.   Patient is right handed.    Family History  Problem Relation Age of Onset  . Heart disease Father   . Heart attack Father   . Emphysema Mother   . Alcohol abuse Maternal Grandmother   . Alcohol abuse  Maternal Grandfather   . Heart attack Paternal Grandfather   . Heart disease Paternal Grandfather   . Rheum arthritis Brother     ROS- All systems are reviewed and negative except as per the HPI above  Physical Exam: Vitals:   12/24/17 0835  BP: 122/68  Pulse: 68  Weight: 228 lb 12.8 oz (103.8 kg)  Height: 5\' 9"  (1.753 m)    GEN- The patient is well appearing, alert and oriented x 3 today.   Head- normocephalic, atraumatic Eyes-  Sclera clear, conjunctiva pink Ears- hearing intact Oropharynx- clear Neck- supple, no JVP Lymph- no cervical lymphadenopathy Lungs- Clear to ausculation bilaterally, normal work of breathing Heart- Regular rate and rhythm, no murmurs, rubs or gallops, PMI not laterally displaced GI- soft, NT, ND, + BS Extremities- no clubbing, cyanosis, or edema MS- no significant deformity or atrophy Skin- no rash or lesion Psych- euthymic mood, full affect Neuro- strength and sensation are intact  EKG-Atrial paced rhythm with prolonged AV conduction, LAD, LBBB,  v rate at 68 bpm, pr int 270 ms, qrs int 150 ms, qtc 478 ms(stable) Paceart reports reviewed   Assessment and Plan: 1. afib Has been in SR on tikosyn, very low afib burden Recent breakthrough with diarrhea, now resolved Bmet/mag today Continue eliquis for chadsvasc score of at least 3 Continue tikosyn 250 meq bid  2. CHF  Clinically stable Continue BB/ACE/diuretic Continue to avoid salt/limit fluids  3. HTN Stable  4. PPM F/u with Dr. Caryl Comes as scheduled in 6 months   F/u with Dr. Irish Lack as scheduled  afib clinic as needed  Don Jacobs. Don Jacobs, [facility] [address] [place], [place] [address] [phone]

## 2018-01-19 ENCOUNTER — Other Ambulatory Visit (HOSPITAL_COMMUNITY): Payer: Self-pay | Admitting: *Deleted

## 2018-01-19 MED ORDER — DOFETILIDE 250 MCG PO CAPS: 250.0000 ug | ORAL_CAPSULE | Freq: Two times a day (BID) | ORAL | 2 refills | Status: AC

## 2018-01-20 ENCOUNTER — Ambulatory Visit (HOSPITAL_COMMUNITY): Payer: BLUE CROSS/BLUE SHIELD | Admitting: Nurse Practitioner

## 2018-01-24 ENCOUNTER — Ambulatory Visit (INDEPENDENT_AMBULATORY_CARE_PROVIDER_SITE_OTHER): Payer: Medicare Other | Admitting: *Deleted

## 2018-01-24 DIAGNOSIS — I495 Sick sinus syndrome: Secondary | ICD-10-CM

## 2018-01-24 NOTE — Progress Notes (Signed)
Remote pacemaker transmission.   

## 2018-01-25 ENCOUNTER — Encounter: Payer: Self-pay | Admitting: Cardiology

## 2018-01-26 LAB — CUP PACEART REMOTE DEVICE CHECK
Battery Remaining Longevity: 54 mo
Brady Statistic RA Percent Paced: 91.81 %
Date Time Interrogation Session: 20190429122444
Implantable Lead Implant Date: 20140523
Implantable Lead Location: 753860
Implantable Lead Model: 5076
Implantable Lead Model: 5076
Lead Channel Impedance Value: 361 Ohm
Lead Channel Pacing Threshold Pulse Width: 0.4 ms
Lead Channel Sensing Intrinsic Amplitude: 1.375 mV
Lead Channel Sensing Intrinsic Amplitude: 8.5 mV
Lead Channel Setting Pacing Pulse Width: 0.4 ms
Lead Channel Setting Sensing Sensitivity: 4 mV
MDC IDC LEAD IMPLANT DT: 20140523
MDC IDC LEAD LOCATION: 753859
MDC IDC MSMT BATTERY VOLTAGE: 2.99 V
MDC IDC MSMT LEADCHNL RA IMPEDANCE VALUE: 437 Ohm
MDC IDC MSMT LEADCHNL RA PACING THRESHOLD AMPLITUDE: 0.625 V
MDC IDC MSMT LEADCHNL RA SENSING INTR AMPL: 1.375 mV
MDC IDC MSMT LEADCHNL RV IMPEDANCE VALUE: 456 Ohm
MDC IDC MSMT LEADCHNL RV IMPEDANCE VALUE: 494 Ohm
MDC IDC MSMT LEADCHNL RV PACING THRESHOLD AMPLITUDE: 1 V
MDC IDC MSMT LEADCHNL RV PACING THRESHOLD PULSEWIDTH: 0.4 ms
MDC IDC MSMT LEADCHNL RV SENSING INTR AMPL: 8.5 mV
MDC IDC PG IMPLANT DT: 20140523
MDC IDC SET LEADCHNL RA PACING AMPLITUDE: 2 V
MDC IDC SET LEADCHNL RV PACING AMPLITUDE: 2.5 V
MDC IDC STAT BRADY AP VP PERCENT: 0.17 %
MDC IDC STAT BRADY AP VS PERCENT: 92.72 %
MDC IDC STAT BRADY AS VP PERCENT: 0.02 %
MDC IDC STAT BRADY AS VS PERCENT: 7.09 %
MDC IDC STAT BRADY RV PERCENT PACED: 0.23 %

## 2018-01-27 ENCOUNTER — Encounter: Payer: Self-pay | Admitting: Internal Medicine

## 2018-01-27 ENCOUNTER — Ambulatory Visit (INDEPENDENT_AMBULATORY_CARE_PROVIDER_SITE_OTHER): Payer: Medicare Other | Admitting: Internal Medicine

## 2018-01-27 DIAGNOSIS — J302 Other seasonal allergic rhinitis: Secondary | ICD-10-CM

## 2018-01-27 DIAGNOSIS — R0609 Other forms of dyspnea: Secondary | ICD-10-CM | POA: Diagnosis not present

## 2018-01-27 DIAGNOSIS — J3089 Other allergic rhinitis: Secondary | ICD-10-CM | POA: Diagnosis not present

## 2018-01-27 DIAGNOSIS — Z87891 Personal history of nicotine dependence: Secondary | ICD-10-CM | POA: Diagnosis not present

## 2018-01-27 DIAGNOSIS — G4733 Obstructive sleep apnea (adult) (pediatric): Secondary | ICD-10-CM

## 2018-01-27 LAB — PULMONARY FUNCTION TEST
DL/VA % PRED: 96 %
DL/VA: 4.43 ml/min/mmHg/L
DLCO unc % pred: 68 %
DLCO unc: 22.15 ml/min/mmHg
FEF 25-75 Post: 1.78 L/sec
FEF 25-75 Pre: 2.96 L/sec
FEF2575-%Change-Post: -40 %
FEF2575-%PRED-POST: 88 %
FEF2575-%Pred-Pre: 148 %
FEV1-%CHANGE-POST: -10 %
FEV1-%PRED-PRE: 100 %
FEV1-%Pred-Post: 89 %
FEV1-POST: 2.59 L
FEV1-PRE: 2.9 L
FEV1FVC-%CHANGE-POST: 2 %
FEV1FVC-%Pred-Pre: 111 %
FEV6-%Change-Post: -12 %
FEV6-%PRED-PRE: 95 %
FEV6-%Pred-Post: 82 %
FEV6-POST: 3.13 L
FEV6-Pre: 3.59 L
FEV6FVC-%PRED-POST: 107 %
FEV6FVC-%Pred-Pre: 107 %
FVC-%Change-Post: -12 %
FVC-%PRED-POST: 77 %
FVC-%PRED-PRE: 88 %
FVC-POST: 3.13 L
FVC-PRE: 3.59 L
POST FEV6/FVC RATIO: 100 %
PRE FEV6/FVC RATIO: 100 %
Post FEV1/FVC ratio: 83 %
Pre FEV1/FVC ratio: 81 %
RV % PRED: 104 %
RV: 2.76 L
TLC % PRED: 87 %
TLC: 6.18 L

## 2018-01-27 NOTE — Assessment & Plan Note (Signed)
He continues to benefit from CPAP and download confirms good compliance and control. Plan-continue CPAP 10

## 2018-01-27 NOTE — Progress Notes (Signed)
PFT completed today. 01/27/18.

## 2018-01-27 NOTE — Progress Notes (Signed)
Patient ID: Don Jacobs, male    DOB: 03-13-1936, 82 y.o.   MRN: [id number]  M former smoker followed for OSA,hypoxia complicated by A. Fib/ pacemaker, HBP, hypothyroid, aortic aneurysm PFT 01/27/18- diffusion mildly reduced.  Normal spirometry without response to dilator, normal lung volumes  -------------------------------------------------------.  09/27/17- 82 year old male former smoker followed for OSA , complicated by A. fib/pacemaker, HBP, hypothyroid, thoracic aortic aneurysm CPAP 10/Advanced ----OSA; DME: [facility]. Pt wears CPAP nightly and DL attached. Pressure works well for patient;sleeps well and feels great during the day. Pt notes he has a head cold today-ears clogged and slight sore throat; cough, sinus pressure. Has used Dayquil and Nyquil, and some left-over prednisone. Download 100% compliance, AHI 5.6/hour.  Very comfortable with CPAP and sleeps better. [name] gave him Advair which costs $140 but does help his breathing.  We discussed alternatives and evaluation-can get PFT and assess.  Former smoker.  Dyspnea on exertion with little cough or wheeze. Today acutely ill with head congestion, productive cough, no fever or purulent sputum.  No myalgias.  01/27/2018- 82 year old male former smoker followed for OSA , complicated by A. fib/pacemaker, HBP, hypothyroid, thoracic aortic aneurysm CPAP 10/Advanced ----Breathing has been okay but has had some problems with allergy this year. Cpap is great  PFT 01/27/2018-diffusion mildly reduced.  Normal spirometry without response to dilator, normal lung volumes. Download 100% compliance AHI 7.1/hour.  Has 2 CPAP machines and feels much better when he sleeps with CPAP. He reports little cough or wheeze.  Had increased allergic rhinitis earlier this spring managed OTC and now improved. Swims 3 days/week and rides stationary bike daily.  He has not needed inhaler since he resolved bronchitis this winter.  When paroxysmal A. fib he does notice  shortness of breath. CT chest/aorta 07/21/2017 IMPRESSION: 1. Ascending aortic aneurysm 4.6 cm, without interval enlargement or complicating features. 2. Coronary and Aortic Atherosclerosis (ICD10-170.0) 3. Left nephrolithiasis Lungs/Pleura: 0.7 cm intrathoracic lymph node adjacent to left major fissure image 64/4, stable since 07/19/2013 consistent with benignity. 0.4 cm subpleural nodule, lateral right upper lobe image 63/4, also stable since 07/19/2013. No new nodule or infiltrate. No pleural effusion. No pneumothorax.  ROS-see HPI + = positive Constitutional:   No-   weight loss, night sweats, fevers, chills, fatigue, lassitude. HEENT:   No-  headaches, difficulty swallowing, tooth/dental problems, sore throat,       No-  sneezing, itching, ear ache, time nasal congestion, post nasal drip,  CV:  No-   chest pain, orthopnea, PND, swelling in lower extremities, anasarca, dizziness, palpitations Resp: + Shortness of breath with exertion or at rest.              productive cough,   non-productive cough,  No- coughing up of blood.              No-   change in color of mucus.  No- wheezing.   Skin: No-   rash or lesions. GI:  No-   heartburn, indigestion, abdominal pain, nausea, vomiting,  GU:  MS:  No-   joint pain or swelling.   Neuro-     nothing unusual Psych:  No- change in mood or affect. No depression or anxiety.  No memory loss.  OBJ- Physical Exam General- Alert, Oriented, Affect-appropriate, Distress- none acute, + overweight Skin- rash-none, lesions- none, excoriation- none Lymphadenopathy- none Head- atraumatic            Eyes- Gross vision intact, PERRLA, conjunctivae and secretions clear  Ears- + Hearing aid/ HOH            Nose-+ turbinate edema, no-Septal dev, mucus +, polyps, erosion, perforation             Throat- Mallampati III-IV , mucosa clear , drainage- none, tonsils- atrophic. dentures Neck- flexible , trachea midline, no stridor , thyroid nl,  carotid no bruit Chest - symmetrical excursion , unlabored           Heart/CV- RRR/ paced, no murmur , no gallop  , no rub, nl s1 s2                           - JVD- none , edema- none, stasis changes- none, varices- none           Lung- , wheeze- none, cough, dullness-none, rub- none           Chest wall-  +L pacemaker Abd-  Br/ Gen/ Rectal- Not done, not indicated Extrem- cyanosis- none, clubbing, none, atrophy- none, strength- nl Neuro- grossly intact to observation

## 2018-01-27 NOTE — Patient Instructions (Addendum)
We can continue CPAP 10, mask of choice, humidifier, supplies, Airview  I don't think you need to be using inhaled medicines for your breathing now. If you begin having problems, please let us know.

## 2018-01-27 NOTE — Assessment & Plan Note (Signed)
He describes seasonal exacerbation around the time of peak tree pollens earlier this spring, now markedly improved.  We did discuss use of Flonase and nonsedating antihistamines if needed.

## 2018-01-27 NOTE — Assessment & Plan Note (Signed)
There is mild diffusion reduction but otherwise PFTs are normal.  I think his dyspnea on exertion primarily reflects his heart disease and deconditioning

## 2018-02-01 DIAGNOSIS — M1712 Unilateral primary osteoarthritis, left knee: Secondary | ICD-10-CM | POA: Diagnosis not present

## 2018-02-07 ENCOUNTER — Telehealth (HOSPITAL_COMMUNITY): Payer: Self-pay | Admitting: *Deleted

## 2018-02-07 NOTE — Telephone Encounter (Signed)
Patient called in stating he has been in and out of afib since Friday. He is currently back in afib today. HR is controlled in the 70-80s BP 113/72. He is short of breath with any exertion. Pt is going to try an extra 1/2 tab of lasix at lunch today and call back if still in afib tomorrow. Pt in agreement.

## 2018-03-08 DIAGNOSIS — M1712 Unilateral primary osteoarthritis, left knee: Secondary | ICD-10-CM | POA: Diagnosis not present

## 2018-03-25 DIAGNOSIS — M79642 Pain in left hand: Secondary | ICD-10-CM | POA: Diagnosis not present

## 2018-03-25 DIAGNOSIS — M85832 Other specified disorders of bone density and structure, left forearm: Secondary | ICD-10-CM | POA: Diagnosis not present

## 2018-03-25 DIAGNOSIS — M85831 Other specified disorders of bone density and structure, right forearm: Secondary | ICD-10-CM | POA: Diagnosis not present

## 2018-03-25 DIAGNOSIS — M25532 Pain in left wrist: Secondary | ICD-10-CM | POA: Diagnosis not present

## 2018-03-25 DIAGNOSIS — M10032 Idiopathic gout, left wrist: Secondary | ICD-10-CM | POA: Diagnosis not present

## 2018-03-25 DIAGNOSIS — M11231 Other chondrocalcinosis, right wrist: Secondary | ICD-10-CM | POA: Diagnosis not present

## 2018-03-25 DIAGNOSIS — M24032 Loose body in left wrist: Secondary | ICD-10-CM | POA: Diagnosis not present

## 2018-03-25 DIAGNOSIS — M79641 Pain in right hand: Secondary | ICD-10-CM | POA: Diagnosis not present

## 2018-04-14 DIAGNOSIS — M109 Gout, unspecified: Secondary | ICD-10-CM | POA: Diagnosis not present

## 2018-04-14 DIAGNOSIS — M25562 Pain in left knee: Secondary | ICD-10-CM | POA: Diagnosis not present

## 2018-04-25 ENCOUNTER — Ambulatory Visit (INDEPENDENT_AMBULATORY_CARE_PROVIDER_SITE_OTHER): Payer: Medicare Other | Admitting: *Deleted

## 2018-04-25 ENCOUNTER — Telehealth: Payer: Self-pay | Admitting: Cardiology

## 2018-04-25 DIAGNOSIS — I495 Sick sinus syndrome: Secondary | ICD-10-CM

## 2018-04-25 NOTE — Telephone Encounter (Signed)
Spoke with pt and reminded pt of remote transmission that is due today. Pt verbalized understanding.   

## 2018-04-26 ENCOUNTER — Encounter: Payer: Self-pay | Admitting: Cardiology

## 2018-04-26 NOTE — Progress Notes (Signed)
Remote pacemaker transmission.   

## 2018-04-29 LAB — CUP PACEART REMOTE DEVICE CHECK
Battery Remaining Longevity: 46 mo
Battery Voltage: 2.98 V
Brady Statistic AP VP Percent: 0.18 %
Brady Statistic AS VS Percent: 3.26 %
Brady Statistic RA Percent Paced: 94.38 %
Date Time Interrogation Session: 20190729181013
Implantable Lead Implant Date: 20140523
Implantable Lead Implant Date: 20140523
Implantable Lead Location: 753860
Implantable Lead Model: 5076
Implantable Pulse Generator Implant Date: 20140523
Lead Channel Impedance Value: 380 Ohm
Lead Channel Impedance Value: 475 Ohm
Lead Channel Pacing Threshold Amplitude: 0.625 V
Lead Channel Pacing Threshold Pulse Width: 0.4 ms
Lead Channel Pacing Threshold Pulse Width: 0.4 ms
Lead Channel Sensing Intrinsic Amplitude: 1.75 mV
Lead Channel Sensing Intrinsic Amplitude: 1.75 mV
Lead Channel Sensing Intrinsic Amplitude: 10 mV
Lead Channel Setting Pacing Amplitude: 2 V
Lead Channel Setting Pacing Pulse Width: 0.4 ms
MDC IDC LEAD LOCATION: 753859
MDC IDC MSMT LEADCHNL RA IMPEDANCE VALUE: 437 Ohm
MDC IDC MSMT LEADCHNL RV IMPEDANCE VALUE: 513 Ohm
MDC IDC MSMT LEADCHNL RV PACING THRESHOLD AMPLITUDE: 0.875 V
MDC IDC MSMT LEADCHNL RV SENSING INTR AMPL: 10 mV
MDC IDC SET LEADCHNL RV PACING AMPLITUDE: 2.5 V
MDC IDC SET LEADCHNL RV SENSING SENSITIVITY: 4 mV
MDC IDC STAT BRADY AP VS PERCENT: 96.5 %
MDC IDC STAT BRADY AS VP PERCENT: 0.06 %
MDC IDC STAT BRADY RV PERCENT PACED: 0.27 %

## 2018-05-12 DIAGNOSIS — M25562 Pain in left knee: Secondary | ICD-10-CM | POA: Diagnosis not present

## 2018-05-19 DIAGNOSIS — J209 Acute bronchitis, unspecified: Secondary | ICD-10-CM | POA: Diagnosis not present

## 2018-05-19 DIAGNOSIS — M1711 Unilateral primary osteoarthritis, right knee: Secondary | ICD-10-CM | POA: Diagnosis not present

## 2018-05-27 ENCOUNTER — Telehealth: Payer: Self-pay | Admitting: *Deleted

## 2018-05-27 NOTE — Telephone Encounter (Addendum)
   [facility] Pre-operative Risk Assessment    Request for surgical clearance:  1. What type of surgery is being performed?  LEFT KNEE ARTHROPLASTY   2. When is this surgery scheduled?  TBD   3. What type of clearance is required (medical clearance vs. Pharmacy clearance to hold med vs. Both)?  BOTH  4. Are there any medications that need to be held prior to surgery and how long?  PLEASE ADVISE WHEN TO STOP ELIQUIS  5. Practice name and name of physician performing surgery?  [place] [facility]   6. What is your office phone number [phone]    7.   What is your office fax number [phone]  8.   Anesthesia type (None, local, MAC, general) ?   GENERAL   Jeanann Lewandowsky 05/27/2018, 11:43 AM  _________________________________________________________________   (provider comments below)

## 2018-06-08 DIAGNOSIS — R52 Pain, unspecified: Secondary | ICD-10-CM | POA: Diagnosis not present

## 2018-06-08 DIAGNOSIS — Z01818 Encounter for other preprocedural examination: Secondary | ICD-10-CM | POA: Diagnosis not present

## 2018-06-08 DIAGNOSIS — Z79899 Other long term (current) drug therapy: Secondary | ICD-10-CM | POA: Diagnosis not present

## 2018-06-08 DIAGNOSIS — J208 Acute bronchitis due to other specified organisms: Secondary | ICD-10-CM | POA: Diagnosis not present

## 2018-06-08 DIAGNOSIS — Z8679 Personal history of other diseases of the circulatory system: Secondary | ICD-10-CM | POA: Diagnosis not present

## 2018-06-08 DIAGNOSIS — E559 Vitamin D deficiency, unspecified: Secondary | ICD-10-CM | POA: Diagnosis not present

## 2018-06-08 DIAGNOSIS — M79609 Pain in unspecified limb: Secondary | ICD-10-CM | POA: Diagnosis not present

## 2018-06-08 DIAGNOSIS — Z950 Presence of cardiac pacemaker: Secondary | ICD-10-CM | POA: Diagnosis not present

## 2018-06-09 ENCOUNTER — Telehealth: Payer: Self-pay | Admitting: *Deleted

## 2018-06-09 NOTE — Telephone Encounter (Signed)
   Primary Cardiologist: Larae Grooms, MD  Chart reviewed as part of pre-operative protocol coverage. Patient was contacted 06/09/2018 in reference to pre-operative risk assessment for pending surgery as outlined below.  JHAMIR PICKUP was last seen on 12/24/2017 by Roderic Palau, NP.  Since that day, JUDDSON COBERN has done well.   He is exercising 3 days a week and doing well with this.  He has lost some weight.  He feels his volume status is good.  He has not had any new chest pain or shortness of breath.  Therefore, based on ACC/AHA guidelines, the patient would be at acceptable risk for the planned procedure without further cardiovascular testing.   Once I hear back from the pharmacist regarding his Coumadin, I will route this recommendation to the requesting party via [facility] fax function and remove from pre-op pool.  Please call with questions.  Rosaria Ferries, PA-C 06/09/2018, 4:59 PM

## 2018-06-09 NOTE — Telephone Encounter (Signed)
error 

## 2018-06-13 NOTE — Telephone Encounter (Signed)
See notes for clearance and eliquis.

## 2018-06-13 NOTE — Telephone Encounter (Signed)
Pt takes Eliquis for afib with CHADS2VASc score of 4 (age x2, HTN, CHF). CrCl is 35mL/min. Recommend holding Eliquis for 3 days prior to TKA per protocol.

## 2018-06-16 ENCOUNTER — Other Ambulatory Visit: Payer: Self-pay | Admitting: *Deleted

## 2018-06-16 DIAGNOSIS — I712 Thoracic aortic aneurysm, without rupture, unspecified: Secondary | ICD-10-CM

## 2018-06-17 ENCOUNTER — Telehealth (HOSPITAL_COMMUNITY): Payer: Self-pay | Admitting: *Deleted

## 2018-06-17 NOTE — Telephone Encounter (Signed)
Remote transmissions from 9/19 and 9/20 reviewed. Presenting rhythm from today: ApVs w/PVCs. (1) AF episode recorded on 05/13/18 x 2hrs + Tikosyn/Eliquis. No ventricular arrhythmias recorded. 131 PVCs/hr since 04/25/18 (was 49.3 PVCs/hr between 02/05/18 - 04/25/18). Stable lead measurements. Normal device function.  Will forward information to triage.

## 2018-06-17 NOTE — Telephone Encounter (Signed)
Patient called in this morning stating he has not been feeling well the last 3 days. He has been more short of breath and had intermittent times of dizziness. States he is noticing HRs in the 40s at home on his machine. Patient states his weight is stable. BP is normal. Patient will send a remote transmission to see if change in rhythm. Will be in touch with patient once device clinic reviews transmissions.

## 2018-06-17 NOTE — Telephone Encounter (Signed)
No problem. Thanks for the heads up 

## 2018-06-17 NOTE — Telephone Encounter (Signed)
Pt's wife calling with pt having c/o fatigue, low heart rate. Per pacer report pt has had increased PVC's and recently began to be symptomatic. Pt's wife has agreed to be seen by Dr Caryl Comes on Sept 24 in [place], as this is the only time available until Dr Caryl Comes returns back to the office in October. Pt will be going out of town for the weekend, but will bring his transmitter in case he needs to call in a transmission.

## 2018-06-21 ENCOUNTER — Ambulatory Visit (INDEPENDENT_AMBULATORY_CARE_PROVIDER_SITE_OTHER): Payer: Medicare Other | Admitting: Internal Medicine

## 2018-06-21 ENCOUNTER — Encounter: Payer: Self-pay | Admitting: Internal Medicine

## 2018-06-21 VITALS — BP 110/70 | HR 72 | Ht 69.0 in | Wt 210.5 lb

## 2018-06-21 DIAGNOSIS — I480 Paroxysmal atrial fibrillation: Secondary | ICD-10-CM | POA: Diagnosis not present

## 2018-06-21 DIAGNOSIS — I495 Sick sinus syndrome: Secondary | ICD-10-CM

## 2018-06-21 DIAGNOSIS — Z950 Presence of cardiac pacemaker: Secondary | ICD-10-CM | POA: Diagnosis not present

## 2018-06-21 DIAGNOSIS — Z79899 Other long term (current) drug therapy: Secondary | ICD-10-CM

## 2018-06-21 DIAGNOSIS — I447 Left bundle-branch block, unspecified: Secondary | ICD-10-CM | POA: Diagnosis not present

## 2018-06-21 DIAGNOSIS — I428 Other cardiomyopathies: Secondary | ICD-10-CM

## 2018-06-21 NOTE — Patient Instructions (Addendum)
Medication Instructions: - Your physician recommends that you continue on your current medications as directed. Please refer to the Current Medication list given to you today.  Labwork: - Your physician recommends that you have lab work today: BMP/ CBC/ Magnesium  Procedures/Testing: - Your physician has requested that you have an echocardiogram [facility]). Echocardiography is a painless test that uses sound waves to create images of your heart. It provides your doctor with information about the size and shape of your heart and how well your heart's chambers and valves are working. This procedure takes approximately one hour. There are no restrictions for this procedure. Please make sure you come hydrated to you appointment as we do occasionally have to start an IV on patients. This is done to inject an image enhancer if needed to obtain optimal pictures.   Follow-Up: - Remote monitoring is used to monitor your Pacemaker of ICD from home. This monitoring reduces the number of office visits required to check your device to one time per year. It allows [place] to keep an eye on the functioning of your device to ensure it is working properly. You are scheduled for a device check from home on 07/25/18. You may send your transmission at any time that day. If you have a wireless device, the transmission will be sent automatically. After your physician reviews your transmission, you will receive a postcard with your next transmission date.  - Your physician wants you to follow-up in: 1 year with Dr. Caryl Comes. You will receive a reminder letter in the mail two months in advance. If you don't receive a letter, please call our office to schedule the follow-up appointment.  Any Additional Special Instructions Will Be Listed Below (If Applicable).     If you need a refill on your cardiac medications before your next appointment, please call your pharmacy.

## 2018-06-21 NOTE — Progress Notes (Signed)
skf      Patient Care Team: [name], MD as PCP - General (Internal Medicine) Jettie Booze, MD as PCP - Cardiology (Cardiology) Jettie Booze, MD (Cardiology)   HPI  Don Jacobs is a 82 y.o. male Seen following pacemaker implantation 5/14 for paroxysmal atrial fibrillation with a rapid rate associated with symptomatic post termination pausing and presyncope. He also has underlying bradycardia.  He has a nonischemic cardiomyopathy with left bundle branch block  He is on dofetilide; previously had taking amiodarone and flecainide he did not tolerate. Anticoagulated with apixoban   He saw Dr. Greggory Brandy 6/16 with the decision to continue medical therapy with a low threshold for proceeding with catheter ablation     DATE TEST    2010 Cath  Clean Cors  3/16 myoview   no ischemic   3/16  Echo   EF 40-/45 %   9/17 Echo   EF 25-30 %   10/17  Cath EF 35-40% CA normal      Date Cr K Mg Hgb  9/17 1.4 3.9  12.3  8/18 1.43 4.4 1.8            His CHADS-VASc score is 4, age-19, hypertension-1, and vascular disease- 1.   Last week, felt terrible and dizzy and pulse of 40  transimission confirmed PVCs  Now better     Mostly doing very well with no sob or chest pain or edema     Past Medical History:  Diagnosis Date  . Allergic rhinitis   . Aortic aneurysm, thoracic ([place]) 05/25/2011  . BPH (benign prostatic hyperplasia)   . Chronic systolic CHF (congestive heart failure) ([place])   . Dizziness and giddiness 06/04/2015  . ED (erectile dysfunction)   . Gout   . Hearing loss    uses hearing a cyst  . Hx of migraines   . Hypertension   . Hypothyroidism   . Persistent atrial fibrillation ([place])   . Prostatitis, chronic   . Seborrheic keratosis   . Sick sinus syndrome ([facility])    a. s/p PPM   . Sleep apnea   . Spondylolisthesis 07/2010    Past Surgical History:  Procedure Laterality Date  . ANTERIOR CERVICAL DECOMP/DISCECTOMY FUSION  10/2000   "C4, 5, 6 w/fusion"  (02/17/2013)  . BACK SURGERY    . CARDIAC CATHETERIZATION  2000's   "once" (02/17/2013)  . CARDIAC CATHETERIZATION N/A 07/23/2016   Procedure: Right/Left Heart Cath and Coronary Angiography;  Surgeon: Jettie Booze, MD;  Location: [facility];  Service: Cardiovascular;  Laterality: N/A;  . CARDIOVERSION N/A 04/09/2014   Procedure: CARDIOVERSION;  Surgeon: Lelon Perla, MD;  Location: [facility] ENDOSCOPY;  Service: Cardiovascular;  Laterality: N/A;  . CARDIOVERSION N/A 01/17/2015   Procedure: CARDIOVERSION;  Surgeon: Larey Dresser, MD;  Location[facility] ENDOSCOPY;  Service: Cardiovascular;  Laterality: N/A;  . FOOT SURGERY Right ~ 2007   "reconstruction" (02/17/2013)  . INSERT / REPLACE / REMOVE PACEMAKER  02/17/2013  . KNEE ARTHROSCOPY Left ~ 2007  . L5 selective nerve root block     Dr Nelva Bush  . LUMBAR SPINE SURGERY  2012   "bone graft, Dr. [name]" (02/17/2013)  . PERMANENT PACEMAKER INSERTION N/A 02/17/2013   Procedure: PERMANENT PACEMAKER INSERTION;  Surgeon: Deboraha Sprang, MD;  Location: [facility] CATH LAB;  Service: Cardiovascular;  Laterality: N/A;  . REPAIR PERONEAL TENDONS ANKLE Right 10/2003  . SHOULDER OPEN ROTATOR CUFF REPAIR Right 09/2007; 2013    Current  Outpatient Medications  Medication Sig Dispense Refill  . acetaminophen (TYLENOL) 500 MG tablet Take 500 mg by mouth every 6 (six) hours as needed (for pain.).    Marland Kitchen allopurinol (ZYLOPRIM) 300 MG tablet Take 300 mg by mouth at bedtime.     Marland Kitchen apixaban (ELIQUIS) 5 MG TABS tablet Take 1 tablet (5 mg total) by mouth 2 (two) times daily. 180 tablet 3  . benzonatate (TESSALON) 200 MG capsule Take 1 capsule (200 mg total) by mouth 3 (three) times daily as needed for cough. 30 capsule 1  . Calcium Carbonate (CALTRATE 600 PO) Take 600 mg by mouth at bedtime.     . carvedilol (COREG) 3.125 MG tablet TAKE 1 TABLET BY MOUTH TWICE DAILY 60 tablet 6  . Cholecalciferol (D 2000) 2000 units TABS Take 2,000 Units by mouth every evening.     .  Cinnamon 500 MG capsule Take 500 mg by mouth every evening.     . colchicine 0.6 MG tablet Take 0.6 mg by mouth daily as needed (gout).     . cyanocobalamin 500 MCG tablet Take 500 mcg by mouth every evening.     . dofetilide (TIKOSYN) 250 MCG capsule Take 1 capsule (250 mcg total) by mouth 2 (two) times daily. 180 capsule 2  . furosemide (LASIX) 40 MG tablet TAKE 1 TABLET BY MOUTH DAILY. 90 tablet 1  . guaiFENesin (ROBITUSSIN) 100 MG/5ML SOLN Take 10 mLs by mouth every 4 (four) hours as needed for cough or to loosen phlegm.    Marland Kitchen levothyroxine (SYNTHROID, LEVOTHROID) 112 MCG tablet Take 1 tablet (112 mcg total) by mouth daily before breakfast. 30 tablet 3  . lisinopril (PRINIVIL,ZESTRIL) 5 MG tablet Take 0.5 tablets (2.5 mg total) by mouth daily. 30 tablet 3  . loratadine (CLARITIN) 10 MG tablet Take 5 mg by mouth 2 (two) times daily as needed for allergies or rhinitis.     . Magnesium 400 MG CAPS Take 400 mg by mouth 2 (two) times daily. (Patient taking differently: Take 400 mg by mouth daily. )    . Misc Natural Products (SAW PALMETTO) CAPS Take 1 capsule by mouth daily.    . NON FORMULARY N4- Greens vegetable capsule    . OVER THE COUNTER MEDICATION Take 1 capsule by mouth every evening. [place] Grape Seed Extract    . polyvinyl alcohol (LIQUIFILM TEARS) 1.4 % ophthalmic solution Place 1 drop into both eyes 3 (three) times daily as needed for dry eyes.    . potassium chloride SA (K-DUR,KLOR-CON) 20 MEQ tablet Take 40 mEq 2 (two) times daily by mouth.    . predniSONE (DELTASONE) 10 MG tablet Take 10 mg by mouth daily as needed (GOUT).     . vitamin C (ASCORBIC ACID) 500 MG tablet Take 500 mg by mouth daily.      No current facility-administered medications for this visit.     Allergies  Allergen Reactions  . Penicillins Shortness Of Breath, Rash and Hives    Ended up at ER after using Has patient had a PCN reaction causing immediate rash, facial/tongue/throat swelling, SOB or  lightheadedness with hypotension:Yes Has patient had a PCN reaction causing severe rash involving mucus membranes or skin necrosis:No Has patient had a PCN reaction that required hospitalization:No Has patient had a PCN reaction occurring within the last 10 years:No If all of the above answers are "NO", then may proceed with Cephalosporin use.   Marland Kitchen Oxycodone Other (See Comments)    Altered mental status Altered  mental status    Review of Systems negative except from HPI and PMH  Physical Exam BP 110/70 (BP Location: Left Arm, Patient Position: Sitting, Cuff Size: Normal)   Pulse 72   Ht 5\' 9"  (1.753 m)   Wt 210 lb 8 oz (95.5 kg)   BMI 31.09 kg/m  Well developed and nourished in no acute distress HENT normal Neck supple with JVP-flat Clear Device pocket well healed; without hematoma or erythema.  There is no tethering  Regular rate and rhythm, no murmurs or gallops Abd-soft with active BS No Clubbing cyanosis tr edema Skin-warm and dry A & Oriented  Grossly normal sensory and motor function    ECG Personally reviewed atrial pacing at 72 Intervals 27/15/42   Assessment and  Plan\ Sinus node dysfunction  Atrial fibrillation-paroxysmal  Cardiomyopathy   Left bundle branch block  First-degree AV block  HFrEF  High Risk Medication Surveillance    Obstructive sleep apnea-treated  Pacemaker-Medtronic  PVCs  Cardiac preoperative evaluation   . No intercurrent atrial fibrillation or flutter  Symptoms likely caused by PVCs, device demonstrating increased burden of late.  We will have him increase his magnesium supplement on an as-needed basis.  On Anticoagulation;  No bleeding issues   Functional status is in excess of 4 METS.  Will check his left ventricular function as it is been a couple years.  This might also inform the potential benefit of Entresto versus his lisinopril.  Anticipate surgical clearance

## 2018-06-22 LAB — CBC WITH DIFFERENTIAL/PLATELET
BASOS: 1 %
Basophils Absolute: 0.1 10*3/uL (ref 0.0–0.2)
EOS (ABSOLUTE): 0.1 10*3/uL (ref 0.0–0.4)
Eos: 2 %
Hematocrit: 40.7 % (ref 37.5–51.0)
Hemoglobin: 13.4 g/dL (ref 13.0–17.7)
IMMATURE GRANS (ABS): 0 10*3/uL (ref 0.0–0.1)
IMMATURE GRANULOCYTES: 0 %
LYMPHS: 13 %
Lymphocytes Absolute: 0.9 10*3/uL (ref 0.7–3.1)
MCH: 31.9 pg (ref 26.6–33.0)
MCHC: 32.9 g/dL (ref 31.5–35.7)
MCV: 97 fL (ref 79–97)
MONOS ABS: 0.4 10*3/uL (ref 0.1–0.9)
Monocytes: 6 %
Neutrophils Absolute: 5.2 10*3/uL (ref 1.4–7.0)
Neutrophils: 78 %
PLATELETS: 149 10*3/uL — AB (ref 150–450)
RBC: 4.2 x10E6/uL (ref 4.14–5.80)
RDW: 14.1 % (ref 12.3–15.4)
WBC: 6.7 10*3/uL (ref 3.4–10.8)

## 2018-06-22 LAB — BASIC METABOLIC PANEL
BUN/Creatinine Ratio: 20 (ref 10–24)
BUN: 23 mg/dL (ref 8–27)
CO2: 24 mmol/L (ref 20–29)
Calcium: 9.6 mg/dL (ref 8.6–10.2)
Chloride: 99 mmol/L (ref 96–106)
Creatinine, Ser: 1.13 mg/dL (ref 0.76–1.27)
GFR calc Af Amer: 71 mL/min/{1.73_m2} (ref >59)
GFR, EST NON AFRICAN AMERICAN: 61 mL/min/{1.73_m2} (ref >59)
Glucose: 78 mg/dL (ref 65–99)
POTASSIUM: 4.7 mmol/L (ref 3.5–5.2)
SODIUM: 143 mmol/L (ref 134–144)

## 2018-06-22 LAB — MAGNESIUM: Magnesium: 1.9 mg/dL (ref 1.6–2.3)

## 2018-06-23 ENCOUNTER — Ambulatory Visit (HOSPITAL_COMMUNITY): Payer: Medicare Other | Attending: Cardiovascular Disease

## 2018-06-23 ENCOUNTER — Other Ambulatory Visit: Payer: Self-pay

## 2018-06-23 DIAGNOSIS — I428 Other cardiomyopathies: Secondary | ICD-10-CM | POA: Diagnosis not present

## 2018-06-23 DIAGNOSIS — I712 Thoracic aortic aneurysm, without rupture: Secondary | ICD-10-CM | POA: Diagnosis not present

## 2018-06-23 DIAGNOSIS — Z6831 Body mass index (BMI) 31.0-31.9, adult: Secondary | ICD-10-CM | POA: Diagnosis not present

## 2018-06-23 DIAGNOSIS — I509 Heart failure, unspecified: Secondary | ICD-10-CM | POA: Insufficient documentation

## 2018-06-23 DIAGNOSIS — I4892 Unspecified atrial flutter: Secondary | ICD-10-CM | POA: Diagnosis not present

## 2018-06-23 DIAGNOSIS — I495 Sick sinus syndrome: Secondary | ICD-10-CM | POA: Insufficient documentation

## 2018-06-23 DIAGNOSIS — Z950 Presence of cardiac pacemaker: Secondary | ICD-10-CM | POA: Insufficient documentation

## 2018-06-23 DIAGNOSIS — I447 Left bundle-branch block, unspecified: Secondary | ICD-10-CM | POA: Diagnosis not present

## 2018-06-23 DIAGNOSIS — G4733 Obstructive sleep apnea (adult) (pediatric): Secondary | ICD-10-CM | POA: Diagnosis not present

## 2018-06-23 DIAGNOSIS — I110 Hypertensive heart disease with heart failure: Secondary | ICD-10-CM | POA: Insufficient documentation

## 2018-06-23 DIAGNOSIS — I480 Paroxysmal atrial fibrillation: Secondary | ICD-10-CM | POA: Diagnosis not present

## 2018-06-23 DIAGNOSIS — Z87891 Personal history of nicotine dependence: Secondary | ICD-10-CM | POA: Insufficient documentation

## 2018-06-23 DIAGNOSIS — E669 Obesity, unspecified: Secondary | ICD-10-CM | POA: Insufficient documentation

## 2018-06-28 ENCOUNTER — Telehealth: Payer: Self-pay | Admitting: Cardiovascular Disease

## 2018-06-28 NOTE — Telephone Encounter (Signed)
New Message       Patient returned your call with different #.

## 2018-06-28 NOTE — Telephone Encounter (Signed)
Pt aware of Echo results; we also reviewed surgical clearance and eliquis recommendation. Pt has verbalized understanding and had no additional questions.

## 2018-07-16 IMAGING — CR DG CHEST 2V
2 series · 2 of 2 positions shown · non-contrast
Comparison: CT 07/24/2015.  Chest x-ray 01/13/2015

CLINICAL DATA: Shortness of breath.  Atrial fibrillation .

EXAM:
CHEST  2 VIEW

[chest pa]
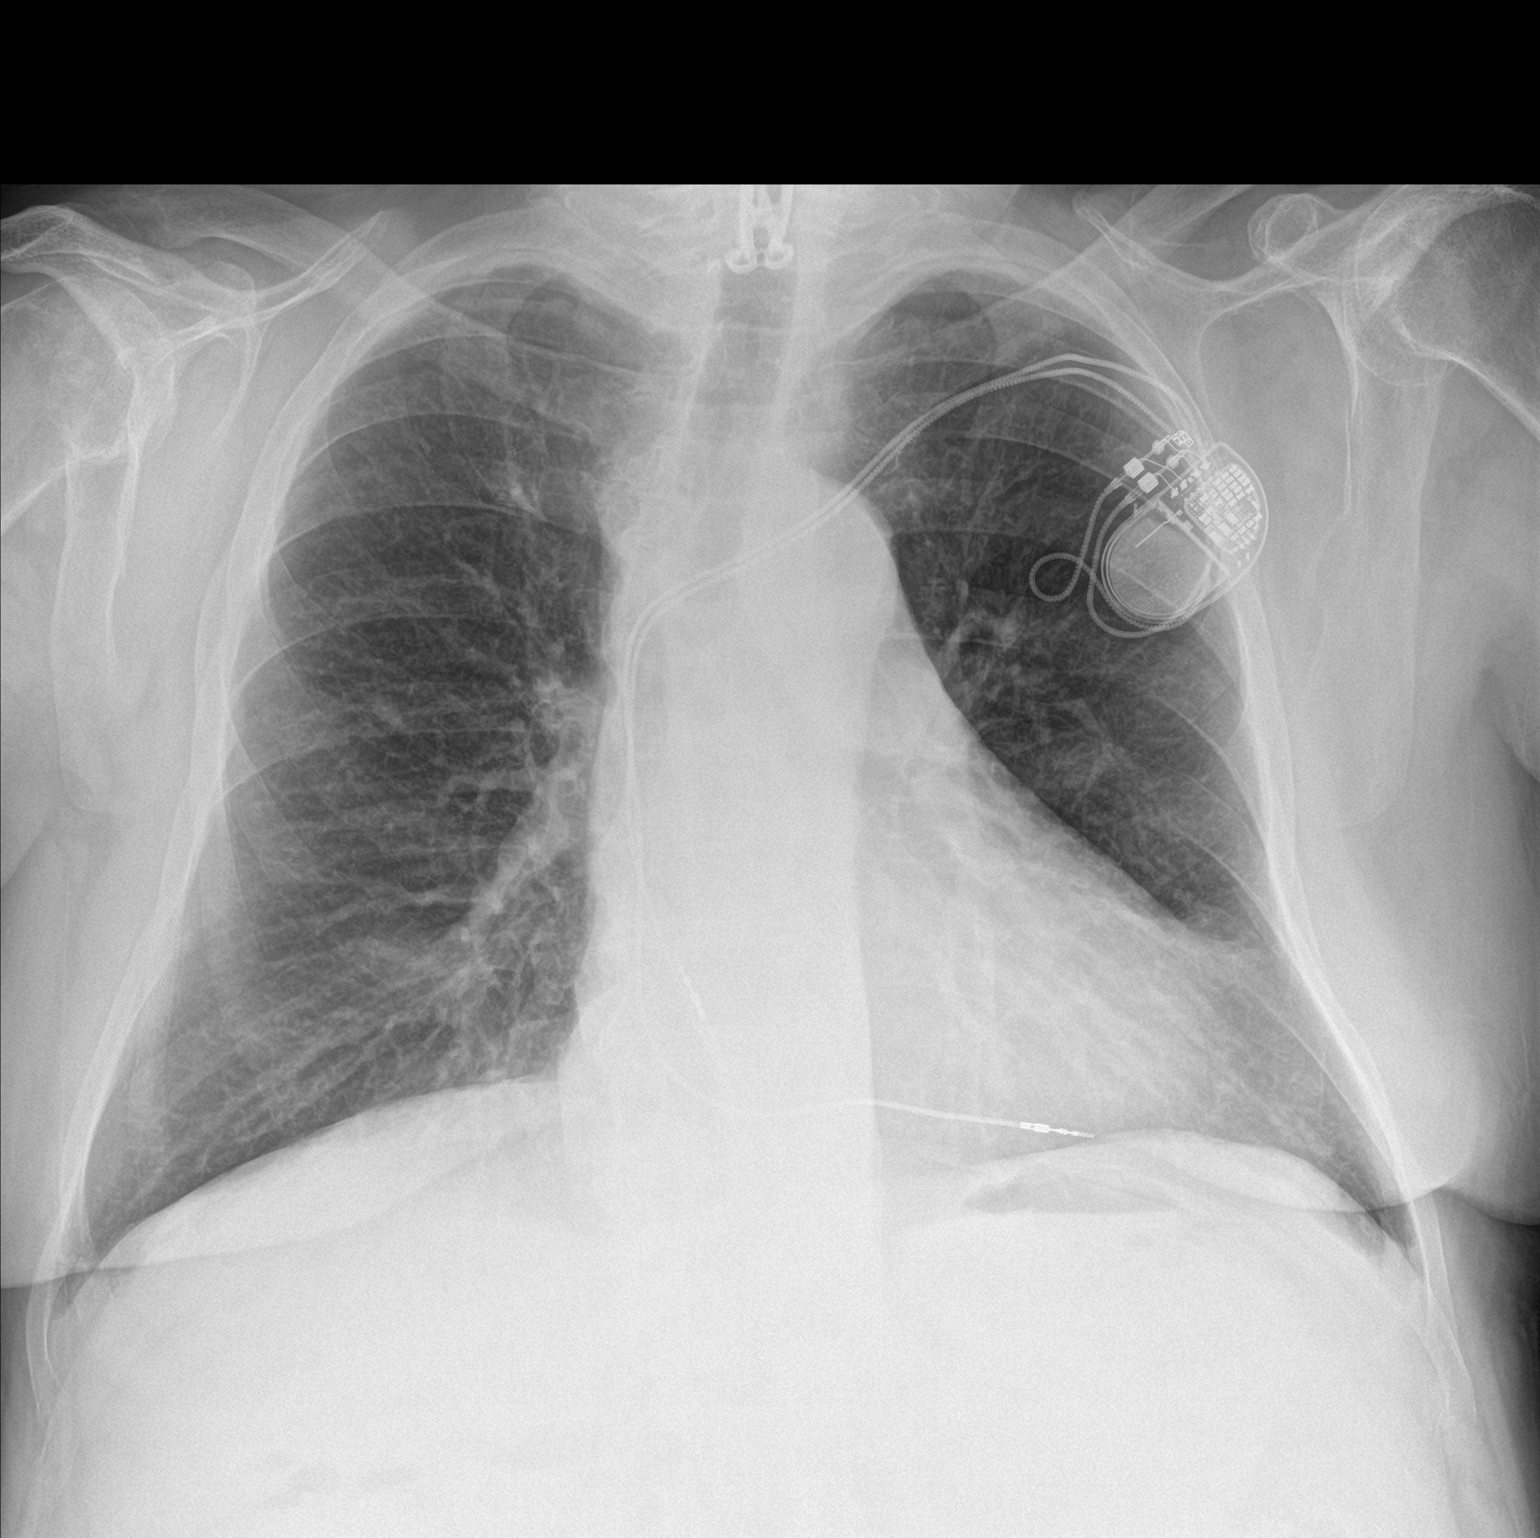

[chest lat]
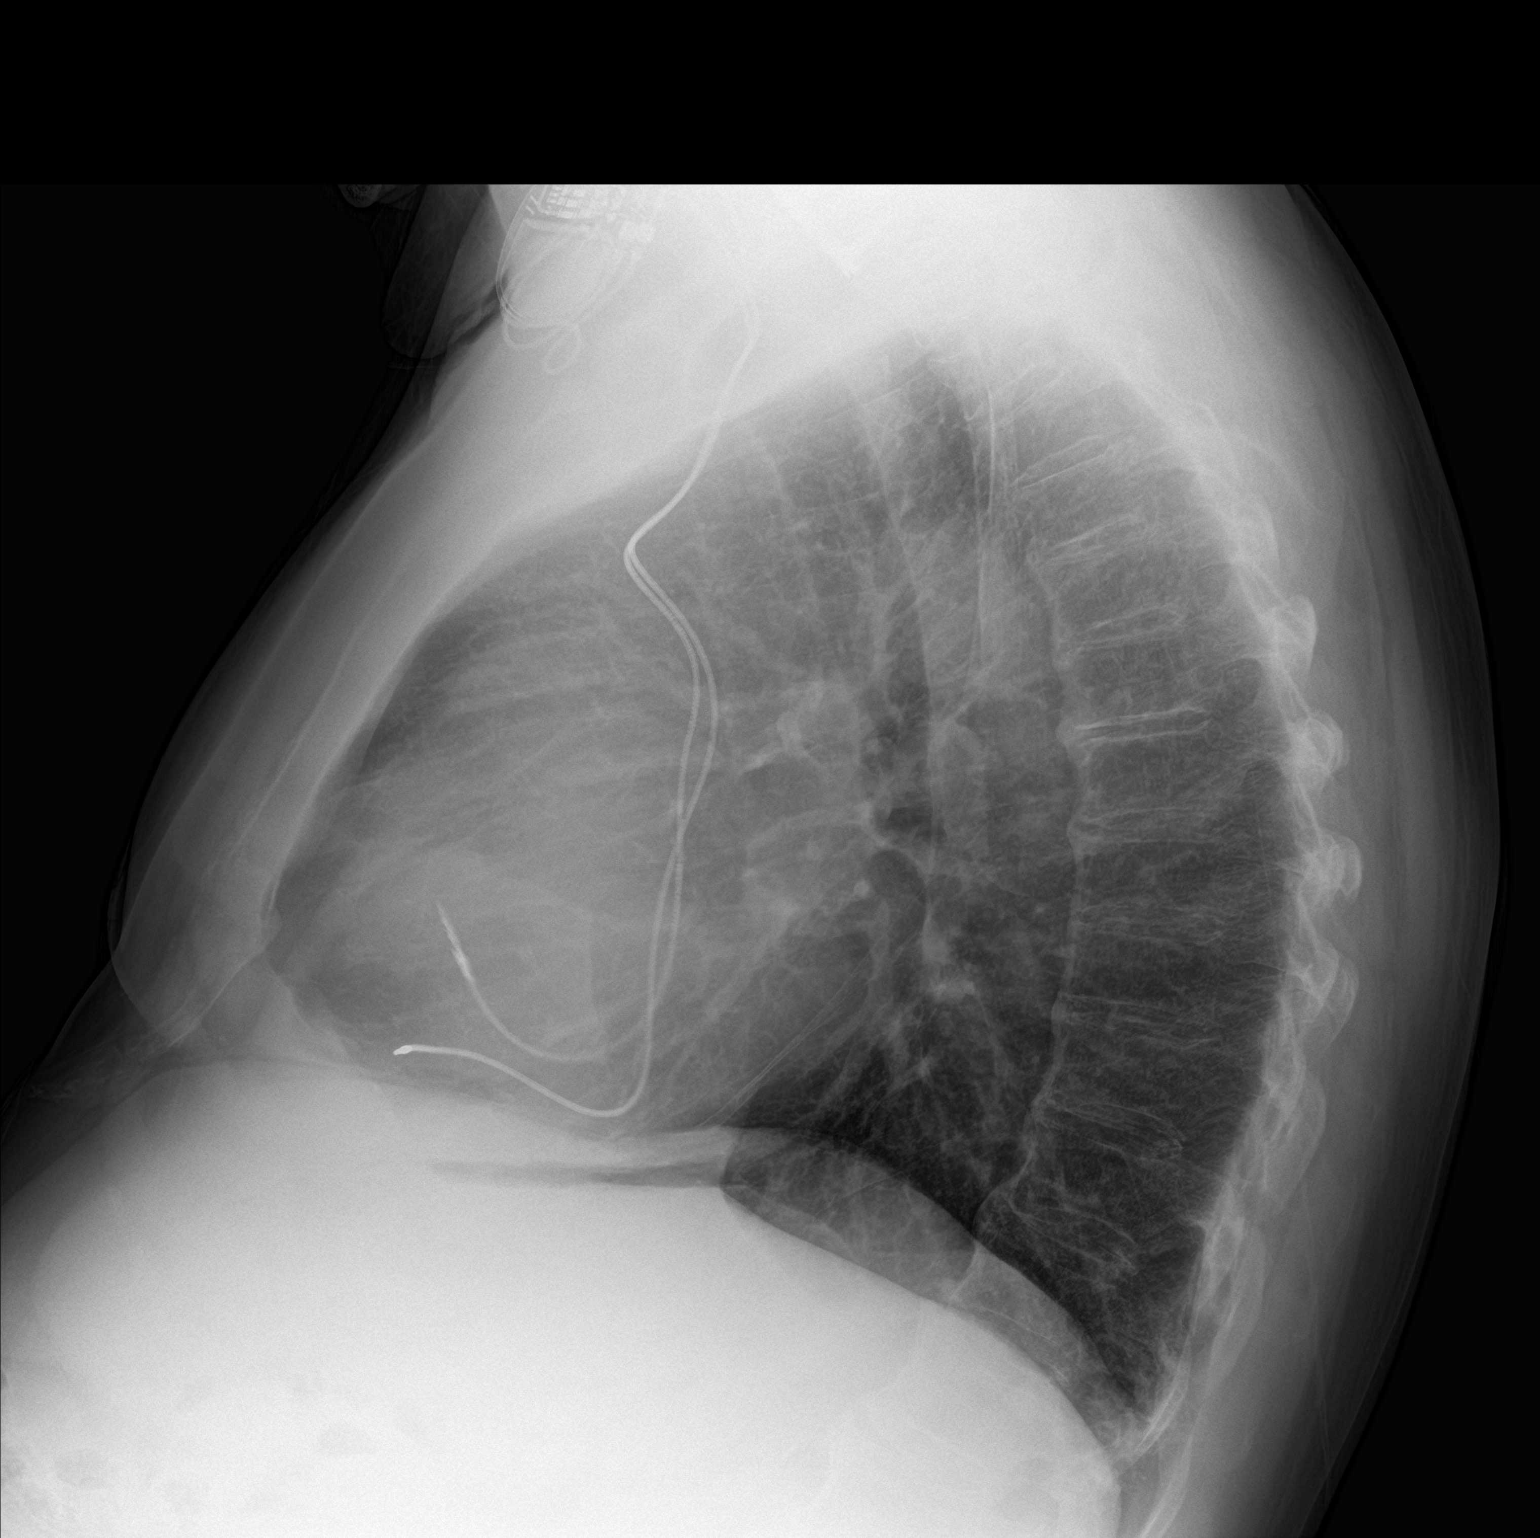

[2 of 2 positions shown; findings below may reference images not displayed]

FINDINGS: Mediastinum and hilar structures normal. Lungs are clear of acute
infiltrates. Faint nodular opacity projected over mid right lung
consistent with vessels on end and is unchanged. Previously
identified nodule left lung noted on prior CT not identified by
plain film exam. Reference is made to prior CT report of 07/24/2015
. Cardiac pacer with lead tips in right atrium and right ventricle.
Cardiomegaly with normal pulmonary vascularity. Prior cervical spine
fusion .
IMPRESSION: 1. Cardiac pacer with lead tips in right atrium and right ventricle.
Cardiomegaly with normal pulmonary vascularity.

2. No acute pulmonary disease.

## 2018-07-20 ENCOUNTER — Encounter: Payer: Self-pay | Admitting: Surgery

## 2018-07-20 ENCOUNTER — Ambulatory Visit (INDEPENDENT_AMBULATORY_CARE_PROVIDER_SITE_OTHER): Payer: Medicare Other | Admitting: Surgery

## 2018-07-20 ENCOUNTER — Ambulatory Visit
Admission: RE | Admit: 2018-07-20 | Discharge: 2018-07-20 | Disposition: A | Discharge status: Home / Self Care | Payer: Medicare Other | Source: Ambulatory Visit | Attending: Surgery | Admitting: Surgery

## 2018-07-20 VITALS — BP 101/67 | HR 82 | Resp 20 | Ht 69.0 in | Wt 216.0 lb

## 2018-07-20 DIAGNOSIS — I712 Thoracic aortic aneurysm, without rupture, unspecified: Secondary | ICD-10-CM

## 2018-07-20 NOTE — Progress Notes (Signed)
HPI:  Don Jacobs returns today with his wife for follow up of a fusiform ascending aortic aneurysm. It had been stable at 4.7-4.8 cm since an MRA of the chest in 03/2011. Last year it was measured at 4.6 cm.  He has been feeling well without chest pain or shortness of breath.  He is scheduled to have his left knee replaced next month.  Current Outpatient Medications  Medication Sig Dispense Refill  . acetaminophen (TYLENOL) 500 MG tablet Take 500 mg by mouth every 6 (six) hours as needed (for pain.).    Marland Kitchen allopurinol (ZYLOPRIM) 300 MG tablet Take 300 mg by mouth at bedtime.     Marland Kitchen apixaban (ELIQUIS) 5 MG TABS tablet Take 1 tablet (5 mg total) by mouth 2 (two) times daily. 180 tablet 3  . benzonatate (TESSALON) 200 MG capsule Take 1 capsule (200 mg total) by mouth 3 (three) times daily as needed for cough. 30 capsule 1  . Calcium Carbonate (CALTRATE 600 PO) Take 600 mg by mouth at bedtime.     . carvedilol (COREG) 3.125 MG tablet TAKE 1 TABLET BY MOUTH TWICE DAILY 60 tablet 6  . Cholecalciferol (D 2000) 2000 units TABS Take 2,000 Units by mouth every evening.     . Cinnamon 500 MG capsule Take 500 mg by mouth every evening.     . colchicine 0.6 MG tablet Take 0.6 mg by mouth daily as needed (gout).     . cyanocobalamin 500 MCG tablet Take 500 mcg by mouth every evening.     . dofetilide (TIKOSYN) 250 MCG capsule Take 1 capsule (250 mcg total) by mouth 2 (two) times daily. 180 capsule 2  . furosemide (LASIX) 40 MG tablet TAKE 1 TABLET BY MOUTH DAILY. 90 tablet 1  . guaiFENesin (ROBITUSSIN) 100 MG/5ML SOLN Take 10 mLs by mouth every 4 (four) hours as needed for cough or to loosen phlegm.    Marland Kitchen levothyroxine (SYNTHROID, LEVOTHROID) 112 MCG tablet Take 1 tablet (112 mcg total) by mouth daily before breakfast. 30 tablet 3  . lisinopril (PRINIVIL,ZESTRIL) 5 MG tablet Take 0.5 tablets (2.5 mg total) by mouth daily. 30 tablet 3  . loratadine (CLARITIN) 10 MG tablet Take 5 mg by mouth 2 (two) times  daily as needed for allergies or rhinitis.     . Magnesium 400 MG CAPS Take 400 mg by mouth 2 (two) times daily. (Patient taking differently: Take 400 mg by mouth daily. )    . Misc Natural Products (SAW PALMETTO) CAPS Take 1 capsule by mouth daily.    . NON FORMULARY N4- Greens vegetable capsule    . OVER THE COUNTER MEDICATION Take 1 capsule by mouth every evening. [place] Grape Seed Extract    . polyvinyl alcohol (LIQUIFILM TEARS) 1.4 % ophthalmic solution Place 1 drop into both eyes 3 (three) times daily as needed for dry eyes.    . potassium chloride SA (K-DUR,KLOR-CON) 20 MEQ tablet Take 40 mEq 2 (two) times daily by mouth.    . predniSONE (DELTASONE) 10 MG tablet Take 10 mg by mouth daily as needed (GOUT).     . vitamin C (ASCORBIC ACID) 500 MG tablet Take 500 mg by mouth daily.      No current facility-administered medications for this visit.      Physical Exam: BP 101/67   Pulse 82   Resp 20   Ht 5\' 9"  (1.753 m)   Wt 216 lb (98 kg)   SpO2 93% Comment:  RA  BMI 31.90 kg/m  He looks well Cardiac exam shows a regular rate and rhythm with normal heart sounds. There is no murmur Lungs are clear There is no peripheral edema  Diagnostic Tests:  CLINICAL DATA:  82 y/o  M; thoracic aortic aneurysm for follow-up.  EXAM: CT CHEST WITHOUT CONTRAST  TECHNIQUE: Multidetector CT imaging of the chest was performed following the standard protocol without IV contrast.  COMPARISON:  07/21/2017 and 07/15/2016 CTA chest  FINDINGS: Cardiovascular: 4.8 x 4.8 cm ascending aortic aneurysm (AP by ML series 3, image 74). Mild calcific atherosclerosis of the thoracic aorta. Normal caliber main pulmonary artery. Severe coronary artery calcific atherosclerosis. Normal heart size. No significant pericardial effusion. Two lead pacemaker.  Mediastinum/Nodes: No enlarged mediastinal or axillary lymph nodes. Thyroid gland, trachea, and esophagus demonstrate no  significant findings.  Lungs/Pleura: Multiple stable 2-3 mm calcified and noncalcified pulmonary nodules and 5 mm nodule along the left major fissure are stable, likely benign. No consolidation, effusion, or pneumothorax.  Upper Abdomen: Left kidney upper pole 3 mm nonobstructing stone. Partially visualized right kidney cyst measuring up to 5.5 cm.  Musculoskeletal: No fracture is seen.  IMPRESSION: 1. 4.8 cm ascending aortic aneurysm, 4.6 cm in 2018, 4.8 cm in 2017. Recommend semi-annual imaging followup by CTA or MRA and referral to cardiothoracic surgery if not already obtained. This recommendation follows 2010 ACCF/AHA/AATS/ACR/ASA/SCA/SCAI/SIR/STS/SVM Guidelines for the Diagnosis and Management of Patients With Thoracic Aortic Disease. Circulation. 2010; 121: I786-V672 2. Severe coronary artery calcific atherosclerosis. Aortic Atherosclerosis (ICD10-I70.0). 3. Left kidney upper pole 3 mm nonobstructing stone.   Electronically Signed   By: Kristine Garbe M.D.   On: 07/20/2018 14:49         *[facility]*                        [address], [place] [address]                            [phone]  ------------------------------------------------------------------- Echocardiography  Patient:    Don Jacobs, Don Jacobs MR #:       [id number] Study Date: 06/23/2018 Gender:     M Age:        38 Height:     175.3 cm Weight:     95.5 kg BSA:        2.18 m^2 Pt. Status: Room:   SONOGRAPHER  Wyatt Mage, RDCS  ATTENDING    Virl Axe  Dwyane Luo, Remo Lipps  REFERRING    Virl Axe  PERFORMING   [facility], Outpatient  cc:  ------------------------------------------------------------------- LV EF: 40% -   45%  ------------------------------------------------------------------- Indications:      Atrial fibrillation (I48).  ------------------------------------------------------------------- History:    PMH:   Dyspnea.  Atrial fibrillation.  Atrial flutter. Congestive heart failure.  Non-ischemic cardiomyopathy.  Risk factors:  Thoracic aortic aneurysm. Bradycardia. OSA. Sick sinus syndrome. Former tobacco use. Hypertension. Obese.  ------------------------------------------------------------------- Study Conclusions  - Left ventricle: There was mild concentric hypertrophy. Systolic   function was mildly to moderately reduced. The estimated ejection   fraction was in the range of 40% to 45%. Diffuse hypokinesis.   Doppler parameters are consistent with abnormal left ventricular   relaxation (grade 1 diastolic dysfunction). - Ventricular septum: Septal  motion showed paradox. These changes   are consistent with right ventricular pacing. - Left atrium: The atrium was mildly dilated.  Impressions:  - Left ventricular systolic function has improved since 06/18/2016.  ------------------------------------------------------------------- Labs, prior tests, procedures, and surgery: Transthoracic echocardiography (06/18/2016).     EF was 25%.  Permanent pacemaker system implantation.  ------------------------------------------------------------------- Study data:  Comparison was made to the study of 06/18/2016.  Study status:  Routine.  Procedure:  The patient reported no pain pre or post test. Transthoracic echocardiography. Image quality was adequate.  Study completion:  There were no complications. Echocardiography.  M-mode, complete 2D, spectral Doppler, and color Doppler.  Birthdate:  Patient birthdate: July 03, 1936.  Age:  Patient is 82 yr old.  Sex:  Gender: male.    BMI: 31.1 kg/m^2.  Blood pressure:     110/70  Patient status:  Outpatient.  Study date: Study date: 06/23/2018. Study time: 02:49 PM.  Location:  [facility]  -------------------------------------------------------------------  ------------------------------------------------------------------- Left  ventricle:  There was mild concentric hypertrophy. Systolic function was mildly to moderately reduced. The estimated ejection fraction was in the range of 40% to 45%. Diffuse hypokinesis. Doppler parameters are consistent with abnormal left ventricular relaxation (grade 1 diastolic dysfunction). There was no evidence of elevated ventricular filling pressure by Doppler parameters.  ------------------------------------------------------------------- Aortic valve:   Structurally normal valve. Trileaflet. Cusp separation was normal.  Doppler:  Transvalvular velocity was within the normal range. There was no stenosis. There was no regurgitation.  ------------------------------------------------------------------- Aorta:  Aortic root: The aortic root was normal in size. Ascending aorta: The ascending aorta was mildly dilated.  ------------------------------------------------------------------- Mitral valve:   Structurally normal valve.   Leaflet separation was normal.  Doppler:  Transvalvular velocity was within the normal range. There was no evidence for stenosis. There was trivial regurgitation.    Valve area by pressure half-time: 1.58 cm^2. Indexed valve area by pressure half-time: 0.72 cm^2/m^2.  ------------------------------------------------------------------- Left atrium:  The atrium was mildly dilated.  ------------------------------------------------------------------- Right ventricle:  The cavity size was normal. Pacer wire or catheter noted in right ventricle. Systolic function was normal.   ------------------------------------------------------------------- Ventricular septum:   Septal motion showed paradox. These changes are consistent with right ventricular pacing.  ------------------------------------------------------------------- Tricuspid valve:   Structurally normal valve.   Leaflet separation was normal.  Doppler:  Transvalvular velocity was within the  normal range. There was trivial regurgitation.  ------------------------------------------------------------------- Pulmonary artery:   Systolic pressure was within the normal range.   ------------------------------------------------------------------- Right atrium:  Pacer wire or catheter noted in right atrium.  ------------------------------------------------------------------- Measurements   Left ventricle                           Value          Reference  LV ID, ED, PLAX chordal                  48    mm       43 - 52  LV ID, ES, PLAX chordal                  32    mm       23 - 38  LV fx shortening, PLAX chordal           33    %        >=29  LV PW thickness, ED  12    mm       ---------  IVS/LV PW ratio, ED                      1.17           <=1.3  Stroke volume, 2D                        97    ml       ---------  Stroke volume/bsa, 2D                    44    ml/m^2   ---------  LV e&', lateral                           7.54  cm/s     ---------  LV E/e&', lateral                         4.89           ---------  LV e&', medial                            6.65  cm/s     ---------  LV E/e&', medial                          5.55           ---------  LV e&', average                           7.1   cm/s     ---------  LV E/e&', average                         5.2            ---------    Ventricular septum                       Value          Reference  IVS thickness, ED                        14    mm       ---------    LVOT                                     Value          Reference  LVOT ID, S                               24    mm       ---------  LVOT area                                4.52  cm^2     ---------  LVOT peak velocity, S                    107  cm/s     ---------  LVOT mean velocity, S                    67    cm/s     ---------  LVOT VTI, S                              21.4  cm       ---------    Aortic valve                              Value          Reference  Aortic regurg pressure half-time         578   ms       ---------    Aorta                                    Value          Reference  Aortic root ID, ED                       40    mm       ---------  Ascending aorta ID, A-P, S               48    mm       ---------    Left atrium                              Value          Reference  LA ID, A-P, ES                           43    mm       ---------  LA ID/bsa, A-P                           1.97  cm/m^2   <=2.2  LA volume, S                             35.3  ml       ---------  LA volume/bsa, S                         16.2  ml/m^2   ---------  LA volume, ES, 1-p A4C                   31    ml       ---------  LA volume/bsa, ES, 1-p A4C               14.2  ml/m^2   ---------  LA volume, ES, 1-p A2C                   37.3  ml       ---------  LA volume/bsa, ES, 1-p A2C               17.1  ml/m^2   ---------    Mitral valve  Value          Reference  Mitral E-wave peak velocity              36.9  cm/s     ---------  Mitral A-wave peak velocity              52.5  cm/s     ---------  Mitral deceleration time         (H)     475   ms       150 - 230  Mitral pressure half-time                139   ms       ---------  Mitral E/A ratio, peak                   0.7            ---------  Mitral valve area, PHT, DP               1.58  cm^2     ---------  Mitral valve area/bsa, PHT, DP           0.72  cm^2/m^2 ---------    Pulmonary arteries                       Value          Reference  PA pressure, S, DP                       30    mm Hg    <=30    Tricuspid valve                          Value          Reference  Tricuspid regurg peak velocity           233   cm/s     ---------  Tricuspid peak RV-RA gradient            22    mm Hg    ---------    Systemic veins                           Value          Reference  Estimated CVP                            8     mm Hg    ---------     Right ventricle                          Value          Reference  TAPSE                                    23.4  mm       ---------  RV pressure, S, DP                       30    mm Hg    <=30  RV s&', lateral, S  7.38  cm/s     ---------  Legend: (L)  and  (H)  mark values outside specified reference range.  ------------------------------------------------------------------- Prepared and Electronically Authenticated by  Sanda Klein, MD 2019-09-26T17:53:07 Impression:  This 82 year old gentleman has a stable fusiform ascending aortic aneurysm that is measured between 4.6 to 4.8 cm dating back to 2012.  His scan today measures it at 4.8 cm.  This is 2 mm larger than last year's measurement but the same as the measurement the year prior to that.  I think the variation is due to measurement error.  His blood pressure is under good control and he is on a beta-blocker.  His echocardiogram shows a structurally normal trileaflet aortic valve with no aortic insufficiency.  I reviewed the images with the patient and his wife and answered their questions.  Plan:  I will plan to see him back in 1 year with a CTA of the chest.   I spent 15 minutes performing this established patient evaluation and > 50% of this time was spent face to face counseling and coordinating the care of this patient's aortic aneurysm.    Gaye Pollack, MD [facility] [phone]

## 2018-07-25 ENCOUNTER — Encounter: Payer: Medicare Other | Admitting: *Deleted

## 2018-07-25 ENCOUNTER — Telehealth: Payer: Self-pay | Admitting: Cardiology

## 2018-07-25 NOTE — Telephone Encounter (Signed)
Confirmed remote transmission w/ pt wife.   

## 2018-07-26 ENCOUNTER — Encounter: Payer: Self-pay | Admitting: Cardiology

## 2018-08-02 DIAGNOSIS — M1712 Unilateral primary osteoarthritis, left knee: Secondary | ICD-10-CM | POA: Diagnosis not present

## 2018-08-03 ENCOUNTER — Telehealth: Payer: Self-pay | Admitting: *Deleted

## 2018-08-03 NOTE — Telephone Encounter (Signed)
Faxed echo results to Ivin Booty at [facility] per her request, mutual pt.

## 2018-08-04 NOTE — Telephone Encounter (Signed)
° ° °  Please fax clearance to [facility]

## 2018-08-04 NOTE — Telephone Encounter (Signed)
TTE with improved LVEF. Agree with preop recs as below.

## 2018-08-04 NOTE — Telephone Encounter (Addendum)
   [place], MD  EP: Dr. Caryl Comes  Chart reviewed as part of pre-operative protocol coverage. Pre-op clearance already addressed by colleagues in earlier phone notes. To summarize recommendations:  - Reviewed chart with Dr. Curt Bears (covering Dr. Olin Pia box) who feels Don Jacobs would be at acceptable risk for the planned procedure without further cardiovascular testing.   - Regarding Eliquis, "Pt takes Eliquis for afib with CHADS2VASc score of 4 (age x2, HTN, CHF). CrCl is 71mL/min. Recommend holding Eliquis for 3 days prior to TKA per protocol." Resume as soon as felt safe by surgery team.  Will route this bundled recommendation to requesting provider via Epic fax function. Please call with questions.  Charlie Pitter, PA-C 08/04/2018, 4:30 PM

## 2018-08-04 NOTE — Telephone Encounter (Signed)
   Primary Cardiologist: Larae Grooms, MD  EP: Dr. Caryl Comes  Chart reviewed as part of pre-operative protocol coverage. The original request was sent almost 2 months ago. More recent than prior phone notes, patient was recently seen by Dr. Caryl Comes 06/21/18 for pre-op clearance due to increased PVCs. Dr. Caryl Comes recommended an echo which was done, but need finalized statement on clearance from Dr. Caryl Comes. - Please route response to P [facility] PREOP (the pre-op pool). Thank you.  Charlie Pitter, PA-C 08/04/2018, 1:38 PM

## 2018-08-09 DIAGNOSIS — Z885 Allergy status to narcotic agent status: Secondary | ICD-10-CM | POA: Diagnosis not present

## 2018-08-09 DIAGNOSIS — Z880 Allergy status to penicillin: Secondary | ICD-10-CM | POA: Diagnosis not present

## 2018-08-09 DIAGNOSIS — I509 Heart failure, unspecified: Secondary | ICD-10-CM | POA: Diagnosis present

## 2018-08-09 DIAGNOSIS — Z950 Presence of cardiac pacemaker: Secondary | ICD-10-CM | POA: Diagnosis not present

## 2018-08-09 DIAGNOSIS — Z471 Aftercare following joint replacement surgery: Secondary | ICD-10-CM | POA: Diagnosis not present

## 2018-08-09 DIAGNOSIS — I10 Essential (primary) hypertension: Secondary | ICD-10-CM | POA: Diagnosis not present

## 2018-08-09 DIAGNOSIS — M109 Gout, unspecified: Secondary | ICD-10-CM | POA: Diagnosis present

## 2018-08-09 DIAGNOSIS — I2510 Atherosclerotic heart disease of native coronary artery without angina pectoris: Secondary | ICD-10-CM | POA: Diagnosis not present

## 2018-08-09 DIAGNOSIS — E079 Disorder of thyroid, unspecified: Secondary | ICD-10-CM | POA: Diagnosis present

## 2018-08-09 DIAGNOSIS — Z96652 Presence of left artificial knee joint: Secondary | ICD-10-CM | POA: Diagnosis not present

## 2018-08-09 DIAGNOSIS — Z7902 Long term (current) use of antithrombotics/antiplatelets: Secondary | ICD-10-CM | POA: Diagnosis not present

## 2018-08-09 DIAGNOSIS — M1712 Unilateral primary osteoarthritis, left knee: Secondary | ICD-10-CM | POA: Diagnosis present

## 2018-08-09 DIAGNOSIS — G8918 Other acute postprocedural pain: Secondary | ICD-10-CM | POA: Diagnosis not present

## 2018-08-09 DIAGNOSIS — I4891 Unspecified atrial fibrillation: Secondary | ICD-10-CM | POA: Diagnosis present

## 2018-08-09 DIAGNOSIS — I495 Sick sinus syndrome: Secondary | ICD-10-CM | POA: Diagnosis not present

## 2018-08-09 DIAGNOSIS — Z79899 Other long term (current) drug therapy: Secondary | ICD-10-CM | POA: Diagnosis not present

## 2018-08-09 DIAGNOSIS — I110 Hypertensive heart disease with heart failure: Secondary | ICD-10-CM | POA: Diagnosis present

## 2018-08-11 ENCOUNTER — Ambulatory Visit (INDEPENDENT_AMBULATORY_CARE_PROVIDER_SITE_OTHER): Payer: Medicare Other | Admitting: *Deleted

## 2018-08-11 DIAGNOSIS — I495 Sick sinus syndrome: Secondary | ICD-10-CM

## 2018-08-11 DIAGNOSIS — I5022 Chronic systolic (congestive) heart failure: Secondary | ICD-10-CM

## 2018-08-12 DIAGNOSIS — Z79891 Long term (current) use of opiate analgesic: Secondary | ICD-10-CM | POA: Diagnosis not present

## 2018-08-12 DIAGNOSIS — M109 Gout, unspecified: Secondary | ICD-10-CM | POA: Diagnosis not present

## 2018-08-12 DIAGNOSIS — Z7901 Long term (current) use of anticoagulants: Secondary | ICD-10-CM | POA: Diagnosis not present

## 2018-08-12 DIAGNOSIS — I4891 Unspecified atrial fibrillation: Secondary | ICD-10-CM | POA: Diagnosis not present

## 2018-08-12 DIAGNOSIS — I10 Essential (primary) hypertension: Secondary | ICD-10-CM | POA: Diagnosis not present

## 2018-08-12 DIAGNOSIS — Z950 Presence of cardiac pacemaker: Secondary | ICD-10-CM | POA: Diagnosis not present

## 2018-08-12 DIAGNOSIS — Z9181 History of falling: Secondary | ICD-10-CM | POA: Diagnosis not present

## 2018-08-12 DIAGNOSIS — Z471 Aftercare following joint replacement surgery: Secondary | ICD-10-CM | POA: Diagnosis not present

## 2018-08-12 DIAGNOSIS — M1991 Primary osteoarthritis, unspecified site: Secondary | ICD-10-CM | POA: Diagnosis not present

## 2018-08-12 DIAGNOSIS — Z96652 Presence of left artificial knee joint: Secondary | ICD-10-CM | POA: Diagnosis not present

## 2018-08-12 NOTE — Progress Notes (Signed)
Remote pacemaker transmission.   

## 2018-08-15 DIAGNOSIS — Z471 Aftercare following joint replacement surgery: Secondary | ICD-10-CM | POA: Diagnosis not present

## 2018-08-15 DIAGNOSIS — I10 Essential (primary) hypertension: Secondary | ICD-10-CM | POA: Diagnosis not present

## 2018-08-15 DIAGNOSIS — Z96652 Presence of left artificial knee joint: Secondary | ICD-10-CM | POA: Diagnosis not present

## 2018-08-15 DIAGNOSIS — M1991 Primary osteoarthritis, unspecified site: Secondary | ICD-10-CM | POA: Diagnosis not present

## 2018-08-15 DIAGNOSIS — M109 Gout, unspecified: Secondary | ICD-10-CM | POA: Diagnosis not present

## 2018-08-15 DIAGNOSIS — I4891 Unspecified atrial fibrillation: Secondary | ICD-10-CM | POA: Diagnosis not present

## 2018-08-16 DIAGNOSIS — M1991 Primary osteoarthritis, unspecified site: Secondary | ICD-10-CM | POA: Diagnosis not present

## 2018-08-16 DIAGNOSIS — M109 Gout, unspecified: Secondary | ICD-10-CM | POA: Diagnosis not present

## 2018-08-16 DIAGNOSIS — I4891 Unspecified atrial fibrillation: Secondary | ICD-10-CM | POA: Diagnosis not present

## 2018-08-16 DIAGNOSIS — I10 Essential (primary) hypertension: Secondary | ICD-10-CM | POA: Diagnosis not present

## 2018-08-16 DIAGNOSIS — Z96652 Presence of left artificial knee joint: Secondary | ICD-10-CM | POA: Diagnosis not present

## 2018-08-16 DIAGNOSIS — Z471 Aftercare following joint replacement surgery: Secondary | ICD-10-CM | POA: Diagnosis not present

## 2018-08-17 DIAGNOSIS — I10 Essential (primary) hypertension: Secondary | ICD-10-CM | POA: Diagnosis not present

## 2018-08-17 DIAGNOSIS — I4891 Unspecified atrial fibrillation: Secondary | ICD-10-CM | POA: Diagnosis not present

## 2018-08-17 DIAGNOSIS — Z96652 Presence of left artificial knee joint: Secondary | ICD-10-CM | POA: Diagnosis not present

## 2018-08-17 DIAGNOSIS — Z471 Aftercare following joint replacement surgery: Secondary | ICD-10-CM | POA: Diagnosis not present

## 2018-08-17 DIAGNOSIS — M1991 Primary osteoarthritis, unspecified site: Secondary | ICD-10-CM | POA: Diagnosis not present

## 2018-08-17 DIAGNOSIS — M109 Gout, unspecified: Secondary | ICD-10-CM | POA: Diagnosis not present

## 2018-08-18 DIAGNOSIS — I4891 Unspecified atrial fibrillation: Secondary | ICD-10-CM | POA: Diagnosis not present

## 2018-08-18 DIAGNOSIS — M109 Gout, unspecified: Secondary | ICD-10-CM | POA: Diagnosis not present

## 2018-08-18 DIAGNOSIS — M1991 Primary osteoarthritis, unspecified site: Secondary | ICD-10-CM | POA: Diagnosis not present

## 2018-08-18 DIAGNOSIS — Z471 Aftercare following joint replacement surgery: Secondary | ICD-10-CM | POA: Diagnosis not present

## 2018-08-18 DIAGNOSIS — Z96652 Presence of left artificial knee joint: Secondary | ICD-10-CM | POA: Diagnosis not present

## 2018-08-18 DIAGNOSIS — I10 Essential (primary) hypertension: Secondary | ICD-10-CM | POA: Diagnosis not present

## 2018-08-19 DIAGNOSIS — I10 Essential (primary) hypertension: Secondary | ICD-10-CM | POA: Diagnosis not present

## 2018-08-19 DIAGNOSIS — M109 Gout, unspecified: Secondary | ICD-10-CM | POA: Diagnosis not present

## 2018-08-19 DIAGNOSIS — M1991 Primary osteoarthritis, unspecified site: Secondary | ICD-10-CM | POA: Diagnosis not present

## 2018-08-19 DIAGNOSIS — Z471 Aftercare following joint replacement surgery: Secondary | ICD-10-CM | POA: Diagnosis not present

## 2018-08-19 DIAGNOSIS — I4891 Unspecified atrial fibrillation: Secondary | ICD-10-CM | POA: Diagnosis not present

## 2018-08-19 DIAGNOSIS — Z96652 Presence of left artificial knee joint: Secondary | ICD-10-CM | POA: Diagnosis not present

## 2018-08-20 ENCOUNTER — Inpatient Hospital Stay (HOSPITAL_COMMUNITY)
Admission: AD | Admit: 2018-08-20 | Discharge: 2018-08-25 | DRG: 559 | Disposition: A | Discharge status: Home Health | Payer: Medicare Other | Source: Other Acute Inpatient Hospital | Attending: Internal Medicine | Admitting: Internal Medicine

## 2018-08-20 ENCOUNTER — Encounter (HOSPITAL_COMMUNITY): Payer: Self-pay

## 2018-08-20 ENCOUNTER — Inpatient Hospital Stay (HOSPITAL_COMMUNITY): Payer: Medicare Other

## 2018-08-20 ENCOUNTER — Other Ambulatory Visit: Payer: Self-pay

## 2018-08-20 DIAGNOSIS — I4811 Longstanding persistent atrial fibrillation: Secondary | ICD-10-CM

## 2018-08-20 DIAGNOSIS — I4819 Other persistent atrial fibrillation: Secondary | ICD-10-CM | POA: Diagnosis not present

## 2018-08-20 DIAGNOSIS — G4733 Obstructive sleep apnea (adult) (pediatric): Secondary | ICD-10-CM | POA: Diagnosis present

## 2018-08-20 DIAGNOSIS — I110 Hypertensive heart disease with heart failure: Secondary | ICD-10-CM | POA: Diagnosis not present

## 2018-08-20 DIAGNOSIS — Z471 Aftercare following joint replacement surgery: Secondary | ICD-10-CM | POA: Diagnosis not present

## 2018-08-20 DIAGNOSIS — Z981 Arthrodesis status: Secondary | ICD-10-CM

## 2018-08-20 DIAGNOSIS — I428 Other cardiomyopathies: Secondary | ICD-10-CM | POA: Diagnosis not present

## 2018-08-20 DIAGNOSIS — Z885 Allergy status to narcotic agent status: Secondary | ICD-10-CM | POA: Diagnosis not present

## 2018-08-20 DIAGNOSIS — Z7901 Long term (current) use of anticoagulants: Secondary | ICD-10-CM

## 2018-08-20 DIAGNOSIS — R0902 Hypoxemia: Secondary | ICD-10-CM | POA: Diagnosis not present

## 2018-08-20 DIAGNOSIS — L03116 Cellulitis of left lower limb: Secondary | ICD-10-CM | POA: Diagnosis present

## 2018-08-20 DIAGNOSIS — L8990 Pressure ulcer of unspecified site, unspecified stage: Secondary | ICD-10-CM

## 2018-08-20 DIAGNOSIS — Z886 Allergy status to analgesic agent status: Secondary | ICD-10-CM | POA: Diagnosis not present

## 2018-08-20 DIAGNOSIS — T8454XA Infection and inflammatory reaction due to internal left knee prosthesis, initial encounter: Principal | ICD-10-CM

## 2018-08-20 DIAGNOSIS — L538 Other specified erythematous conditions: Secondary | ICD-10-CM | POA: Diagnosis not present

## 2018-08-20 DIAGNOSIS — D649 Anemia, unspecified: Secondary | ICD-10-CM | POA: Diagnosis present

## 2018-08-20 DIAGNOSIS — I4891 Unspecified atrial fibrillation: Secondary | ICD-10-CM | POA: Diagnosis not present

## 2018-08-20 DIAGNOSIS — N179 Acute kidney failure, unspecified: Secondary | ICD-10-CM

## 2018-08-20 DIAGNOSIS — I482 Chronic atrial fibrillation, unspecified: Secondary | ICD-10-CM | POA: Diagnosis not present

## 2018-08-20 DIAGNOSIS — Z880 Allergy status to penicillin: Secondary | ICD-10-CM | POA: Diagnosis not present

## 2018-08-20 DIAGNOSIS — I130 Hypertensive heart and chronic kidney disease with heart failure and stage 1 through stage 4 chronic kidney disease, or unspecified chronic kidney disease: Secondary | ICD-10-CM | POA: Diagnosis not present

## 2018-08-20 DIAGNOSIS — Z950 Presence of cardiac pacemaker: Secondary | ICD-10-CM

## 2018-08-20 DIAGNOSIS — E039 Hypothyroidism, unspecified: Secondary | ICD-10-CM | POA: Diagnosis present

## 2018-08-20 DIAGNOSIS — E8779 Other fluid overload: Secondary | ICD-10-CM | POA: Diagnosis not present

## 2018-08-20 DIAGNOSIS — I480 Paroxysmal atrial fibrillation: Secondary | ICD-10-CM | POA: Diagnosis not present

## 2018-08-20 DIAGNOSIS — I495 Sick sinus syndrome: Secondary | ICD-10-CM | POA: Diagnosis present

## 2018-08-20 DIAGNOSIS — N400 Enlarged prostate without lower urinary tract symptoms: Secondary | ICD-10-CM | POA: Diagnosis present

## 2018-08-20 DIAGNOSIS — R509 Fever, unspecified: Secondary | ICD-10-CM | POA: Diagnosis not present

## 2018-08-20 DIAGNOSIS — I5042 Chronic combined systolic (congestive) and diastolic (congestive) heart failure: Secondary | ICD-10-CM | POA: Diagnosis present

## 2018-08-20 DIAGNOSIS — J9811 Atelectasis: Secondary | ICD-10-CM | POA: Diagnosis not present

## 2018-08-20 DIAGNOSIS — E872 Acidosis: Secondary | ICD-10-CM | POA: Diagnosis not present

## 2018-08-20 DIAGNOSIS — L02416 Cutaneous abscess of left lower limb: Secondary | ICD-10-CM

## 2018-08-20 DIAGNOSIS — R6521 Severe sepsis with septic shock: Secondary | ICD-10-CM | POA: Diagnosis not present

## 2018-08-20 DIAGNOSIS — Z79890 Hormone replacement therapy (postmenopausal): Secondary | ICD-10-CM

## 2018-08-20 DIAGNOSIS — R404 Transient alteration of awareness: Secondary | ICD-10-CM | POA: Diagnosis not present

## 2018-08-20 DIAGNOSIS — I4821 Permanent atrial fibrillation: Secondary | ICD-10-CM | POA: Diagnosis not present

## 2018-08-20 DIAGNOSIS — R609 Edema, unspecified: Secondary | ICD-10-CM | POA: Diagnosis not present

## 2018-08-20 DIAGNOSIS — Z7952 Long term (current) use of systemic steroids: Secondary | ICD-10-CM

## 2018-08-20 DIAGNOSIS — Z79899 Other long term (current) drug therapy: Secondary | ICD-10-CM

## 2018-08-20 DIAGNOSIS — M1990 Unspecified osteoarthritis, unspecified site: Secondary | ICD-10-CM | POA: Diagnosis not present

## 2018-08-20 DIAGNOSIS — N183 Chronic kidney disease, stage 3 (moderate): Secondary | ICD-10-CM | POA: Diagnosis present

## 2018-08-20 DIAGNOSIS — Z8249 Family history of ischemic heart disease and other diseases of the circulatory system: Secondary | ICD-10-CM

## 2018-08-20 DIAGNOSIS — I712 Thoracic aortic aneurysm, without rupture: Secondary | ICD-10-CM | POA: Diagnosis not present

## 2018-08-20 DIAGNOSIS — I509 Heart failure, unspecified: Secondary | ICD-10-CM | POA: Diagnosis not present

## 2018-08-20 DIAGNOSIS — Z96652 Presence of left artificial knee joint: Secondary | ICD-10-CM | POA: Diagnosis not present

## 2018-08-20 DIAGNOSIS — M109 Gout, unspecified: Secondary | ICD-10-CM | POA: Diagnosis present

## 2018-08-20 DIAGNOSIS — H9190 Unspecified hearing loss, unspecified ear: Secondary | ICD-10-CM | POA: Diagnosis present

## 2018-08-20 DIAGNOSIS — A419 Sepsis, unspecified organism: Secondary | ICD-10-CM | POA: Diagnosis present

## 2018-08-20 DIAGNOSIS — Z96651 Presence of right artificial knee joint: Secondary | ICD-10-CM | POA: Diagnosis not present

## 2018-08-20 DIAGNOSIS — Z9889 Other specified postprocedural states: Secondary | ICD-10-CM | POA: Diagnosis not present

## 2018-08-20 DIAGNOSIS — T8454XS Infection and inflammatory reaction due to internal left knee prosthesis, sequela: Secondary | ICD-10-CM | POA: Diagnosis not present

## 2018-08-20 DIAGNOSIS — T8454XD Infection and inflammatory reaction due to internal left knee prosthesis, subsequent encounter: Secondary | ICD-10-CM | POA: Diagnosis not present

## 2018-08-20 DIAGNOSIS — I959 Hypotension, unspecified: Secondary | ICD-10-CM | POA: Diagnosis not present

## 2018-08-20 DIAGNOSIS — Z87891 Personal history of nicotine dependence: Secondary | ICD-10-CM

## 2018-08-20 DIAGNOSIS — R4182 Altered mental status, unspecified: Secondary | ICD-10-CM | POA: Diagnosis not present

## 2018-08-20 DIAGNOSIS — Z95828 Presence of other vascular implants and grafts: Secondary | ICD-10-CM | POA: Diagnosis not present

## 2018-08-20 DIAGNOSIS — I5023 Acute on chronic systolic (congestive) heart failure: Secondary | ICD-10-CM | POA: Diagnosis not present

## 2018-08-20 DIAGNOSIS — Z452 Encounter for adjustment and management of vascular access device: Secondary | ICD-10-CM | POA: Diagnosis not present

## 2018-08-20 LAB — URINALYSIS, ROUTINE W REFLEX MICROSCOPIC
BILIRUBIN URINE: NEGATIVE
GLUCOSE, UA: 50 mg/dL — AB
Ketones, ur: NEGATIVE mg/dL
NITRITE: NEGATIVE
PROTEIN: NEGATIVE mg/dL
Specific Gravity, Urine: 1.004 — ABNORMAL LOW (ref 1.005–1.030)
pH: 5 (ref 5.0–8.0)

## 2018-08-20 LAB — BASIC METABOLIC PANEL
Anion gap: 10 (ref 5–15)
BUN: 30 mg/dL — AB (ref 8–23)
CALCIUM: 8.4 mg/dL — AB (ref 8.9–10.3)
CO2: 19 mmol/L — AB (ref 22–32)
CREATININE: 1.73 mg/dL — AB (ref 0.61–1.24)
Chloride: 103 mmol/L (ref 98–111)
GFR calc non Af Amer: 36 mL/min — ABNORMAL LOW (ref >60)
GFR, EST AFRICAN AMERICAN: 41 mL/min — AB (ref >60)
GLUCOSE: 131 mg/dL — AB (ref 70–99)
Potassium: 4.3 mmol/L (ref 3.5–5.1)
Sodium: 132 mmol/L — ABNORMAL LOW (ref 135–145)

## 2018-08-20 LAB — CBC WITH DIFFERENTIAL/PLATELET
ABS IMMATURE GRANULOCYTES: 0.6 10*3/uL — AB (ref 0.00–0.07)
BASOS ABS: 0.1 10*3/uL (ref 0.0–0.1)
Basophils Relative: 0 %
Eosinophils Absolute: 0.1 10*3/uL (ref 0.0–0.5)
Eosinophils Relative: 0 %
HCT: 31.3 % — ABNORMAL LOW (ref 39.0–52.0)
HEMOGLOBIN: 9.8 g/dL — AB (ref 13.0–17.0)
IMMATURE GRANULOCYTES: 2 %
LYMPHS PCT: 3 %
Lymphs Abs: 1.3 10*3/uL (ref 0.7–4.0)
MCH: 30.7 pg (ref 26.0–34.0)
MCHC: 31.3 g/dL (ref 30.0–36.0)
MCV: 98.1 fL (ref 80.0–100.0)
Monocytes Absolute: 1.8 10*3/uL — ABNORMAL HIGH (ref 0.1–1.0)
Monocytes Relative: 5 %
NEUTROS ABS: 34.3 10*3/uL — AB (ref 1.7–7.7)
NEUTROS PCT: 90 %
NRBC: 0 % (ref 0.0–0.2)
PLATELETS: 417 10*3/uL — AB (ref 150–400)
RBC: 3.19 MIL/uL — AB (ref 4.22–5.81)
RDW: 15 % (ref 11.5–15.5)
WBC: 38 10*3/uL — AB (ref 4.0–10.5)

## 2018-08-20 LAB — APTT
APTT: 37 s — AB (ref 24–36)
aPTT: 41 seconds — ABNORMAL HIGH (ref 24–36)

## 2018-08-20 LAB — MAGNESIUM
Magnesium: 1.7 mg/dL (ref 1.7–2.4)
Magnesium: 1.7 mg/dL (ref 1.7–2.4)

## 2018-08-20 LAB — MRSA PCR SCREENING: MRSA BY PCR: NEGATIVE

## 2018-08-20 LAB — LACTIC ACID, PLASMA
Lactic Acid, Venous: 1.2 mmol/L (ref 0.5–1.9)
Lactic Acid, Venous: 1.5 mmol/L (ref 0.5–1.9)

## 2018-08-20 LAB — PROCALCITONIN: Procalcitonin: 29.96 ng/mL

## 2018-08-20 LAB — GLUCOSE, CAPILLARY: Glucose-Capillary: 97 mg/dL (ref 70–99)

## 2018-08-20 LAB — CORTISOL: Cortisol, Plasma: 18.7 ug/dL

## 2018-08-20 LAB — HEPARIN LEVEL (UNFRACTIONATED): Heparin Unfractionated: >2.2 IU/mL — ABNORMAL HIGH (ref 0.30–0.70)

## 2018-08-20 MED ORDER — HEPARIN (PORCINE) 25000 UT/250ML-% IV SOLN
1600.0000 [IU]/h | INTRAVENOUS | Status: DC
  Administered 2018-08-20: 1250 [IU]/h via INTRAVENOUS
  Administered 2018-08-21: 1600 [IU]/h via INTRAVENOUS
  Filled 2018-08-20 (×2): qty 250

## 2018-08-20 MED ORDER — NOREPINEPHRINE 4 MG/250ML-% IV SOLN
0.0000 ug/min | INTRAVENOUS | Status: DC
  Administered 2018-08-20: 20 ug/min via INTRAVENOUS
  Administered 2018-08-20: 10 ug/min via INTRAVENOUS
  Administered 2018-08-21: 8 ug/min via INTRAVENOUS
  Filled 2018-08-20 (×4): qty 250

## 2018-08-20 MED ORDER — ACETAMINOPHEN 325 MG PO TABS: 650.0000 mg | ORAL_TABLET | ORAL | Status: DC | PRN

## 2018-08-20 MED ORDER — DOFETILIDE 250 MCG PO CAPS
250.0000 ug | ORAL_CAPSULE | Freq: Two times a day (BID) | ORAL | Status: DC
  Administered 2018-08-20 – 2018-08-22 (×5): 250 ug via ORAL
  Filled 2018-08-20 (×6): qty 1

## 2018-08-20 MED ORDER — SODIUM CHLORIDE 0.9 % IV BOLUS
1000.0000 mL | Freq: Once | INTRAVENOUS | Status: AC
  Administered 2018-08-20: 1000 mL via INTRAVENOUS

## 2018-08-20 MED ORDER — NOREPINEPHRINE 4 MG/250ML-% IV SOLN: 0.0000 ug/min | INTRAVENOUS | Status: DC

## 2018-08-20 MED ORDER — SODIUM CHLORIDE 0.9% FLUSH
3.0000 mL | Freq: Two times a day (BID) | INTRAVENOUS | Status: DC
  Administered 2018-08-20 – 2018-08-23 (×7): 3 mL via INTRAVENOUS
  Administered 2018-08-23: 10 mL via INTRAVENOUS
  Administered 2018-08-23 – 2018-08-24 (×3): 3 mL via INTRAVENOUS

## 2018-08-20 MED ORDER — ONDANSETRON HCL 4 MG/2ML IJ SOLN
4.0000 mg | Freq: Four times a day (QID) | INTRAMUSCULAR | Status: DC | PRN
  Administered 2018-08-20: 4 mg via INTRAVENOUS
  Filled 2018-08-20: qty 2

## 2018-08-20 MED ORDER — SODIUM CHLORIDE 0.9 % IV SOLN
250.0000 mL | INTRAVENOUS | Status: DC | PRN
  Administered 2018-08-20 – 2018-08-22 (×2): 250 mL via INTRAVENOUS

## 2018-08-20 MED ORDER — SODIUM CHLORIDE 0.9% FLUSH
3.0000 mL | INTRAVENOUS | Status: DC | PRN
  Administered 2018-08-24: 3 mL via INTRAVENOUS
  Filled 2018-08-20: qty 3

## 2018-08-20 MED ORDER — SODIUM CHLORIDE 0.9 % IV SOLN
2.0000 g | Freq: Two times a day (BID) | INTRAVENOUS | Status: DC
  Administered 2018-08-20 – 2018-08-22 (×4): 2 g via INTRAVENOUS
  Filled 2018-08-20 (×5): qty 2

## 2018-08-20 MED ORDER — VANCOMYCIN HCL IN DEXTROSE 750-5 MG/150ML-% IV SOLN
750.0000 mg | Freq: Two times a day (BID) | INTRAVENOUS | Status: DC
  Administered 2018-08-20 – 2018-08-23 (×6): 750 mg via INTRAVENOUS
  Filled 2018-08-20 (×6): qty 150

## 2018-08-20 MED ORDER — MAGNESIUM SULFATE 2 GM/50ML IV SOLN
2.0000 g | Freq: Once | INTRAVENOUS | Status: AC
  Administered 2018-08-20: 2 g via INTRAVENOUS
  Filled 2018-08-20: qty 50

## 2018-08-20 MED ORDER — ORAL CARE MOUTH RINSE
15.0000 mL | Freq: Two times a day (BID) | OROMUCOSAL | Status: DC
  Administered 2018-08-21 – 2018-08-24 (×3): 15 mL via OROMUCOSAL

## 2018-08-20 NOTE — Progress Notes (Signed)
[facility] NOTE  Pharmacy Consult for heparin and vancomycin/zosyn Indication: atrial fibrillation, sepsis likely due to wound infection   Patient Measurements: Weight: 219 lb 9.3 oz (99.6 kg) Heparin Dosing Weight: 91 kg  Vital Signs: Temp: 99.1 F (37.3 C) (11/23 1200) Temp Source: Oral (11/23 1141) BP: 87/56 (11/23 1236) Pulse Rate: 61 (11/23 1213)  Labs: No results for input(s): HGB, HCT, PLT, APTT, LABPROT, INR, HEPARINUNFRC, HEPRLOWMOCWT, CREATININE, CKTOTAL, CKMB, TROPONINI in the last 72 hours.   Medical History: Past Medical History:  Diagnosis Date  . Allergic rhinitis   . Aortic aneurysm, thoracic ([place]) 05/25/2011  . BPH (benign prostatic hyperplasia)   . Chronic systolic CHF (congestive heart failure) ([place])   . Dizziness and giddiness 06/04/2015  . ED (erectile dysfunction)   . Gout   . Hearing loss    uses hearing a cyst  . Hx of migraines   . Hypertension   . Hypothyroidism   . Persistent atrial fibrillation   . Prostatitis, chronic   . Seborrheic keratosis   . Sick sinus syndrome ([facility])    a. s/p PPM   . Sleep apnea   . Spondylolisthesis 07/2010     Assessment: Patient transferred from outside hospital. Had total knee replacement last week. Admitted with fever, hypotension and erythema around knee. Patient is on Eliquis PTA for AFib. Last dose was yesterday evening. Checking baseline HL and aPTT. Planning to switch patient to heparin infusion incase patient requires I&D or procedure.  Additionally, starting empiric antibiotics with meropenem and vancomycin. PCT 1.5. At outside hospital, last received vancomycin at 0600 today, last received Primaxin at 1000 today.   Noted SCr 1.1 > 1.7.   Goal of Therapy:  Heparin level 0.3-0.7 units/ml Monitor platelets by anticoagulation protocol: Yes   Plan:  Vancomycin 750 mg IV q12h Meropenem 2 g IV q12h Monitor renal fx, cultures, VT at Css  Heparin infusion 1250  units/hr Daily HL, CBC, aPTT Check levels in 8 hours    Harvel Quale 08/20/2018,12:57 PM

## 2018-08-20 NOTE — H&P (Signed)
NAME:  Don Jacobs, MRN:  [id number], DOB:  09-Apr-1936, LOS: 0 ADMISSION DATE:  08/20/2018, CONSULTATION DATE:  11/23 REFERRING MD:  Oval Linsey EDP  --, CHIEF COMPLAINT:  Sepsis Shock   Brief History   82 yo male with OSA on CPAP and A FIB on eliquis /tikosyn with recent left Total Knee replacement transferred from [facility] with septic shock ? Septic joint .   History of present illness   82 year old male former smoker with known history of congestive heart failure /A. fib on Eliquis, pacemaker, thoracic aortic aneurysm, OSA on CPAP, gout on daily prednisone at 2.5 mg Presented to the emergency room with fever T-max 106, altered mental status found to be hypotensive.  Patient underwent a recent left total knee replacement on August 09, 2018 by Dr. Lorin Mercy in [place].  Patient said he was doing well up until the last 4 days he started having nocturnal chills.  Noticed redness swelling and pain along the incision site.  Was seen in orthopedics office and prescribed antibiotic.  Symptoms did not improve and progress.  Labs showed elevated white count at 13,000.  With left shift elevated sed rate at 96, and acute on chronic renal failure with serum creatinine at 1.6.  Appears baseline is around 1.1 lactic acid was 1.3.  Procalcitonin was 1.5.  Flu swab was negative.  Chest x-ray showed no acute process.  Patient was started on antibiotics with Primaxin.  Patient has a penicillin allergy.  Patient required pressor support with levophed at 55mcg . Received 2L IVF .  Patient was transferred to [facility] for PCCM to admit and follow.  Patient is calm and appropriate in ICU.  Mentating well.   Past Medical History  A Fib on Eliquis , OSA on CPAP , Gout on daily prednisone 2.5mg    Significant Hospital Events   Transfer from [facility] 11/23 for septic shock   Consults:  Ortho 11/23   Procedures:    Significant Diagnostic Tests:    Micro Data:  BC x 2  11/23  >>   Antimicrobials:  Primaxin 11/23> 11/23 Merropenem 11/23 >>  Vanc 11/23 >>    Interim history/subjective:  Patient says he is feeling better. Knee is sore and tender.  Objective   Blood pressure (!) 87/56, pulse 61, temperature 99.1 F (37.3 C), resp. rate 19, weight 99.6 kg, SpO2 96 %.        Intake/Output Summary (Last 24 hours) at 08/20/2018 1251 Last data filed at 08/20/2018 1200 Gross per 24 hour  Intake -  Output 60 ml  Net -60 ml   Filed Weights   08/20/18 1235  Weight: 99.6 kg    Examination: General: Elderly male in no acute distress HENT: [place]/AT.  Oral mucosa dry, no JVD Lungs: Clear to auscultation bilaterally with no increased work of breathing Cardiovascular: Irregular, no MRG Abdomen: Obese, soft, nontender, positive bowel sounds Extremities: Left knee dressing with surrounding redness warm to touch and tender.  Right knee previous surgical scar well-healed, trace to no edema, pulses intact Neuro: Alert and oriented x3 no focal deficits noted.  Follows commands.  Moves all extremities well x4 GU: Foley in place  Resolved Hospital Problem list     Assessment & Plan:  Septic Shock possibly secondary to septic joint . S/p 2L IVF bolus prior to transfer.  Recent Left total knee replacement now red swollen and tender. -Titrate Levophed for map greater than 65 -check left knee film .  -ortho  consult  -tr cbc .  -check BC .   A Fib on Chronic Anticoagulation with Elquis and Tikosyn  -Heparin per pharmacy protocol -will resume Tikosyn if mg and k are ok .  -Check EKG  CHF -echo September 2019 EF 40 to 45%, diffuse hypokinesis (improved from 2017)  -Caution with fluid resuscitation -hold coreg and lasix .  -tr chemistries  -  Acute on Chronic renal Failure - suspect related to underlying septic shock -hold ACE inhibitor   OSA on nocturnal CPAP -Continue CPAP at bedtime  Hypomagnesium  -repeat Mg .      Best practice:  Diet: NPO    Pain/Anxiety/Delirium protocol (if indicated): VAP protocol (if indicated):  DVT prophylaxis: Eliquis at home -  GI prophylaxis:  Glucose control:  Mobility: BR  Code Status: Full  Family Communication: pt updated Disposition: ICU   Labs   CBC: No results for input(s): WBC, NEUTROABS, HGB, HCT, MCV, PLT in the last 168 hours.  Basic Metabolic Panel: No results for input(s): NA, K, CL, CO2, GLUCOSE, BUN, CREATININE, CALCIUM, MG, PHOS in the last 168 hours. GFR: CrCl cannot be calculated (Patient's most recent lab result is older than the maximum 21 days allowed.). No results for input(s): PROCALCITON, WBC, LATICACIDVEN in the last 168 hours.  Liver Function Tests: No results for input(s): AST, ALT, ALKPHOS, BILITOT, PROT, ALBUMIN in the last 168 hours. No results for input(s): LIPASE, AMYLASE in the last 168 hours. No results for input(s): AMMONIA in the last 168 hours.  ABG    Component Value Date/Time   PHART 7.391 07/23/2016 0954   PCO2ART 42.8 07/23/2016 0954   PO2ART 61.0 (L) 07/23/2016 0954   HCO3 27.1 07/23/2016 0957   TCO2 28 07/23/2016 0957   O2SAT 67.0 07/23/2016 0957     Coagulation Profile: No results for input(s): INR, PROTIME in the last 168 hours.  Cardiac Enzymes: No results for input(s): CKTOTAL, CKMB, CKMBINDEX, TROPONINI in the last 168 hours.  HbA1C: No results found for: HGBA1C  CBG: Recent Labs  Lab 08/20/18 1139  GLUCAP 97    Review of Systems:   Constitutional:   No  weight loss, night sweats,  + Fevers, chills, fatigue, or  lassitude.  HEENT:   No headaches,  Difficulty swallowing,  Tooth/dental problems, or  Sore throat,                No sneezing, itching, ear ache, nasal congestion, post nasal drip,  + dry mouth   CV:  No chest pain,  Orthopnea, PND, swelling in lower extremities, anasarca, dizziness, palpitations, syncope.   GI  No heartburn, indigestion, abdominal pain, nausea, vomiting, diarrhea, change in bowel habits,+  loss of appetite, No  bloody stools.   Resp: No shortness of breath with exertion or at rest.  No excess mucus, no productive cough,  No non-productive cough,  No coughing up of blood.  No change in color of mucus.  No wheezing.  No chest wall deformity  Skin: no rash or lesions.  GU: no dysuria, change in color of urine, no urgency or frequency.  No flank pain, no hematuria   MS:  + Left knee redness , swelling , pain   Psych:  No change in mood or affect. No depression or anxiety.  No memory loss.       Past Medical History  He,  has a past medical history of Allergic rhinitis, Aortic aneurysm, thoracic ([place]) (05/25/2011), BPH (benign prostatic hyperplasia), Chronic systolic CHF (  congestive heart failure) ([place]), Dizziness and giddiness (06/04/2015), ED (erectile dysfunction), Gout, Hearing loss, migraines, Hypertension, Hypothyroidism, Persistent atrial fibrillation, Prostatitis, chronic, Seborrheic keratosis, Sick sinus syndrome ([place]), Sleep apnea, and Spondylolisthesis (07/2010).   Surgical History    Past Surgical History:  Procedure Laterality Date  . ANTERIOR CERVICAL DECOMP/DISCECTOMY FUSION  10/2000   "C4, 5, 6 w/fusion" (02/17/2013)  . BACK SURGERY    . CARDIAC CATHETERIZATION  2000's   "once" (02/17/2013)  . CARDIAC CATHETERIZATION N/A 07/23/2016   Procedure: Right/Left Heart Cath and Coronary Angiography;  Surgeon: Jettie Booze, MD;  Location: [place] CV LAB;  Service: Cardiovascular;  Laterality: N/A;  . CARDIOVERSION N/A 04/09/2014   Procedure: CARDIOVERSION;  Surgeon: Lelon Perla, MD;  Location: [facility] ENDOSCOPY;  Service: Cardiovascular;  Laterality: N/A;  . CARDIOVERSION N/A 01/17/2015   Procedure: CARDIOVERSION;  Surgeon: Larey Dresser, MD;  Location: [facility] ENDOSCOPY;  Service: Cardiovascular;  Laterality: N/A;  . FOOT SURGERY Right ~ 2007   "reconstruction" (02/17/2013)  . INSERT / REPLACE / REMOVE PACEMAKER  02/17/2013  . KNEE ARTHROSCOPY Left ~ 2007  . L5  selective nerve root block     Dr Nelva Bush  . LUMBAR SPINE SURGERY  2012   "bone graft, Dr. [name]" (02/17/2013)  . PERMANENT PACEMAKER INSERTION N/A 02/17/2013   Procedure: PERMANENT PACEMAKER INSERTION;  Surgeon: Deboraha Sprang, MD;  Location: [facility] CATH LAB;  Service: Cardiovascular;  Laterality: N/A;  . REPAIR PERONEAL TENDONS ANKLE Right 10/2003  . SHOULDER OPEN ROTATOR CUFF REPAIR Right 09/2007; 2013     Social History   reports that he quit smoking about 37 years ago. His smoking use included cigarettes. He has a 12.00 pack-year smoking history. He has never used smokeless tobacco. He reports that he does not drink alcohol or use drugs.   Family History   His family history includes Alcohol abuse in his maternal grandfather and maternal grandmother; Emphysema in his mother; Heart attack in his father and paternal grandfather; Heart disease in his father and paternal grandfather; Rheum arthritis in his brother.   Allergies Allergies  Allergen Reactions  . Penicillins Shortness Of Breath, Rash and Hives    Ended up at ER after using Has patient had a PCN reaction causing immediate rash, facial/tongue/throat swelling, SOB or lightheadedness with hypotension:Yes Has patient had a PCN reaction causing severe rash involving mucus membranes or skin necrosis:No Has patient had a PCN reaction that required hospitalization:No Has patient had a PCN reaction occurring within the last 10 years:No If all of the above answers are "NO", then may proceed with Cephalosporin use.   Marland Kitchen Oxycodone Other (See Comments)    Altered mental status Altered mental status     Home Medications  Prior to Admission medications   Medication Sig Start Date End Date Taking? Authorizing Provider  acetaminophen (TYLENOL) 500 MG tablet Take 500 mg by mouth every 6 (six) hours as needed (for pain.).    [provider]  allopurinol (ZYLOPRIM) 300 MG tablet Take 300 mg by mouth at bedtime.     [provider]  apixaban (ELIQUIS) 5 MG TABS tablet Take 1 tablet (5 mg total) by mouth 2 (two) times daily. 07/05/17   Sherran Needs, NP  benzonatate (TESSALON) 200 MG capsule Take 1 capsule (200 mg total) by mouth 3 (three) times daily as needed for cough. 09/27/17   Baird Lyons D, MD  Calcium Carbonate (CALTRATE 600 PO) Take 600 mg by mouth at bedtime.  [provider]  carvedilol (COREG) 3.125 MG tablet TAKE 1 TABLET BY MOUTH TWICE DAILY 08/05/16   Deboraha Sprang, MD  Cholecalciferol (D 2000) 2000 units TABS Take 2,000 Units by mouth every evening.     [provider]  Cinnamon 500 MG capsule Take 500 mg by mouth every evening.     [provider]  colchicine 0.6 MG tablet Take 0.6 mg by mouth daily as needed (gout).     [provider]  cyanocobalamin 500 MCG tablet Take 500 mcg by mouth every evening.     [provider]  dofetilide (TIKOSYN) 250 MCG capsule Take 1 capsule (250 mcg total) by mouth 2 (two) times daily. 01/19/18   Sherran Needs, NP  furosemide (LASIX) 40 MG tablet TAKE 1 TABLET BY MOUTH DAILY. 07/21/16   Allred, Jeneen Rinks, MD  guaiFENesin (ROBITUSSIN) 100 MG/5ML SOLN Take 10 mLs by mouth every 4 (four) hours as needed for cough or to loosen phlegm.    [provider]  levothyroxine (SYNTHROID, LEVOTHROID) 112 MCG tablet Take 1 tablet (112 mcg total) by mouth daily before breakfast. 01/08/15   Eileen Stanford, PA-C  lisinopril (PRINIVIL,ZESTRIL) 5 MG tablet Take 0.5 tablets (2.5 mg total) by mouth daily. 06/19/16   Sherran Needs, NP  loratadine (CLARITIN) 10 MG tablet Take 5 mg by mouth 2 (two) times daily as needed for allergies or rhinitis.     [provider]  Magnesium 400 MG CAPS Take 400 mg by mouth 2 (two) times daily. Patient taking differently: Take 400 mg by mouth daily.  02/08/15   Sherran Needs, NP  Misc Natural Products (SAW PALMETTO) CAPS Take 1 capsule by mouth daily.    [provider]  NON  FORMULARY N4- Greens vegetable capsule    [provider]  OVER THE COUNTER MEDICATION Take 1 capsule by mouth every evening. [place] Grape Seed Extract    [provider]  polyvinyl alcohol (LIQUIFILM TEARS) 1.4 % ophthalmic solution Place 1 drop into both eyes 3 (three) times daily as needed for dry eyes.    [provider]  potassium chloride SA (K-DUR,KLOR-CON) 20 MEQ tablet Take 40 mEq 2 (two) times daily by mouth.    [provider]  predniSONE (DELTASONE) 10 MG tablet Take 10 mg by mouth daily as needed (GOUT).     [provider]  vitamin C (ASCORBIC ACID) 500 MG tablet Take 500 mg by mouth daily.     [provider]     Critical care time: 50 min

## 2018-08-20 NOTE — Consult Note (Addendum)
ORTHOPAEDIC CONSULTATION  REQUESTING PHYSICIAN: Collier Bullock, MD  Chief Complaint: Left total knee concern for septic joint  HPI: Don Jacobs is a 83 y.o. male with history of left total knee arthroplasty performed by Dr. Joya Salm in [place] 11 days ago on 12 November.  He initially did reasonably however this past weekend he started to have some low-grade fevers.  He was placed on clindamycin and was seen in clinic by Dr. Samule Dry Thursday and was improving as far as pain around his knee.  There was some concern for cellulitis and swelling this seemed to resolve with his clindamycin as well as his compression stockings.  Unfortunately over the weekend the patient became syncopal and more feverish.  He was taken to [facility] and transferred here due to the need for pressors  His index surgeon performed an aspiration of his knee this morning and received 10 cc of bloody fluid.  The index surgeon examined his surgical incision and felt that it was continuing to improve and the patient stated that his knee is feeling better than it had over the last few days.  There is low concern for infection based on Dr. Robert Bellow assessment.  On my history the patient reports that he is feeling better in regards to his knee.  His pain is been minimal.  Feels that the redness and swelling of the knee is much improved compared to a few days ago.  His wife is accompanying him and agrees with this history.  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  Also: orthopedics was not made aware of this transfer request at the time patient was being consider for transfer as we do not immediately have a total joint surgeon available for management if emergent surgical management was necessary.    Past Medical History:  Diagnosis Date  . Allergic rhinitis   . Aortic aneurysm, thoracic ([place]) 05/25/2011  . BPH (benign prostatic hyperplasia)   . Chronic  systolic CHF (congestive heart failure) ([place])   . Dizziness and giddiness 06/04/2015  . ED (erectile dysfunction)   . Gout   . Hearing loss    uses hearing a cyst  . Hx of migraines   . Hypertension   . Hypothyroidism   . Persistent atrial fibrillation   . Prostatitis, chronic   . Seborrheic keratosis   . Sick sinus syndrome ([facility])    a. s/p PPM   . Sleep apnea   . Spondylolisthesis 07/2010   Past Surgical History:  Procedure Laterality Date  . ANTERIOR CERVICAL DECOMP/DISCECTOMY FUSION  10/2000   "C4, 5, 6 w/fusion" (02/17/2013)  . BACK SURGERY    . CARDIAC CATHETERIZATION  2000's   "once" (02/17/2013)  . CARDIAC CATHETERIZATION N/A 07/23/2016   Procedure: Right/Left Heart Cath and Coronary Angiography;  Surgeon: Jettie Booze, MD;  Location: [place] CV LAB;  Service: Cardiovascular;  Laterality: N/A;  . CARDIOVERSION N/A 04/09/2014   Procedure: CARDIOVERSION;  Surgeon: Lelon Perla, MD;  Location: [facility] ENDOSCOPY;  Service: Cardiovascular;  Laterality: N/A;  . CARDIOVERSION N/A 01/17/2015   Procedure: CARDIOVERSION;  Surgeon: Larey Dresser, MD;  Location: [facility] ENDOSCOPY;  Service: Cardiovascular;  Laterality: N/A;  . FOOT SURGERY Right ~ 2007   "reconstruction" (02/17/2013)  . INSERT / REPLACE / REMOVE PACEMAKER  02/17/2013  . KNEE ARTHROSCOPY Left ~ 2007  . L5 selective nerve root block     Dr Nelva Bush  .  LUMBAR SPINE SURGERY  2012   "bone graft, Dr. [name]" (02/17/2013)  . PERMANENT PACEMAKER INSERTION N/A 02/17/2013   Procedure: PERMANENT PACEMAKER INSERTION;  Surgeon: Deboraha Sprang, MD;  Location: [facility] CATH LAB;  Service: Cardiovascular;  Laterality: N/A;  . REPAIR PERONEAL TENDONS ANKLE Right 10/2003  . SHOULDER OPEN ROTATOR CUFF REPAIR Right 09/2007; 2013   Social History   Socioeconomic History  . Marital status: Married    Spouse name: Not on file  . Number of children: 2  . Years of education: 43  . Highest education level: Not on file  Occupational History    . Occupation: retired    Fish farm manager: RETIRED  Social Needs  . Financial resource strain: Not on file  . Food insecurity:    Worry: Not on file    Inability: Not on file  . Transportation needs:    Medical: Not on file    Non-medical: Not on file  Tobacco Use  . Smoking status: Former Smoker    Packs/day: 1.00    Years: 12.00    Pack years: 12.00    Types: Cigarettes    Last attempt to quit: 03/11/1981    Years since quitting: 37.4  . Smokeless tobacco: Never Used  Substance and Sexual Activity  . Alcohol use: No  . Drug use: No  . Sexual activity: Not on file  Lifestyle  . Physical activity:    Days per week: Not on file    Minutes per session: Not on file  . Stress: Not on file  Relationships  . Social connections:    Talks on phone: Not on file    Gets together: Not on file    Attends religious service: Not on file    Active member of club or organization: Not on file    Attends meetings of clubs or organizations: Not on file    Relationship status: Not on file  Other Topics Concern  . Not on file  Social History Narrative   Patient does not drink caffeine.   Patient is right handed.   Family History  Problem Relation Age of Onset  . Heart disease Father   . Heart attack Father   . Emphysema Mother   . Alcohol abuse Maternal Grandmother   . Alcohol abuse Maternal Grandfather   . Heart attack Paternal Grandfather   . Heart disease Paternal Grandfather   . Rheum arthritis Brother    Allergies  Allergen Reactions  . Penicillins Shortness Of Breath, Rash and Hives    Ended up at ER after using Has patient had a PCN reaction causing immediate rash, facial/tongue/throat swelling, SOB or lightheadedness with hypotension:Yes Has patient had a PCN reaction causing severe rash involving mucus membranes or skin necrosis:No Has patient had a PCN reaction that required hospitalization:No Has patient had a PCN reaction occurring within the last 10 years:No If all of  the above answers are "NO", then may proceed with Cephalosporin use.   Marland Kitchen Oxycodone Other (See Comments)    Altered mental status Altered mental status   Prior to Admission medications   Medication Sig Start Date End Date Taking? Authorizing Provider  acetaminophen (TYLENOL) 500 MG tablet Take 500 mg by mouth every 6 (six) hours as needed (for pain.).    [provider]  allopurinol (ZYLOPRIM) 300 MG tablet Take 300 mg by mouth at bedtime.     [provider]  apixaban (ELIQUIS) 5 MG TABS tablet Take 1 tablet (5 mg total) by mouth  2 (two) times daily. 07/05/17   Sherran Needs, NP  benzonatate (TESSALON) 200 MG capsule Take 1 capsule (200 mg total) by mouth 3 (three) times daily as needed for cough. 09/27/17   Baird Lyons D, MD  Calcium Carbonate (CALTRATE 600 PO) Take 600 mg by mouth at bedtime.     [provider]  carvedilol (COREG) 3.125 MG tablet TAKE 1 TABLET BY MOUTH TWICE DAILY 08/05/16   Deboraha Sprang, MD  Cholecalciferol (D 2000) 2000 units TABS Take 2,000 Units by mouth every evening.     [provider]  Cinnamon 500 MG capsule Take 500 mg by mouth every evening.     [provider]  colchicine 0.6 MG tablet Take 0.6 mg by mouth daily as needed (gout).     [provider]  cyanocobalamin 500 MCG tablet Take 500 mcg by mouth every evening.     [provider]  dofetilide (TIKOSYN) 250 MCG capsule Take 1 capsule (250 mcg total) by mouth 2 (two) times daily. 01/19/18   Sherran Needs, NP  furosemide (LASIX) 40 MG tablet TAKE 1 TABLET BY MOUTH DAILY. 07/21/16   Allred, Jeneen Rinks, MD  guaiFENesin (ROBITUSSIN) 100 MG/5ML SOLN Take 10 mLs by mouth every 4 (four) hours as needed for cough or to loosen phlegm.    [provider]  levothyroxine (SYNTHROID, LEVOTHROID) 112 MCG tablet Take 1 tablet (112 mcg total) by mouth daily before breakfast. 01/08/15   Eileen Stanford, PA-C  lisinopril (PRINIVIL,ZESTRIL) 5 MG  tablet Take 0.5 tablets (2.5 mg total) by mouth daily. 06/19/16   Sherran Needs, NP  loratadine (CLARITIN) 10 MG tablet Take 5 mg by mouth 2 (two) times daily as needed for allergies or rhinitis.     [provider]  Magnesium 400 MG CAPS Take 400 mg by mouth 2 (two) times daily. Patient taking differently: Take 400 mg by mouth daily.  02/08/15   Sherran Needs, NP  Misc Natural Products (SAW PALMETTO) CAPS Take 1 capsule by mouth daily.    [provider]  NON FORMULARY N4- Greens vegetable capsule    [provider]  OVER THE COUNTER MEDICATION Take 1 capsule by mouth every evening. [place] Grape Seed Extract    [provider]  polyvinyl alcohol (LIQUIFILM TEARS) 1.4 % ophthalmic solution Place 1 drop into both eyes 3 (three) times daily as needed for dry eyes.    [provider]  potassium chloride SA (K-DUR,KLOR-CON) 20 MEQ tablet Take 40 mEq 2 (two) times daily by mouth.    [provider]  predniSONE (DELTASONE) 10 MG tablet Take 10 mg by mouth daily as needed (GOUT).     [provider]  vitamin C (ASCORBIC ACID) 500 MG tablet Take 500 mg by mouth daily.     [provider]   Dg Knee Left Port  Result Date: 08/20/2018 CLINICAL DATA:  Sepsis EXAM: PORTABLE LEFT KNEE - 1-2 VIEW COMPARISON:  Radiographs from 08/09/2018 FINDINGS: Right total knee prosthesis observed. No appreciable fracture or abnormal lucency along the prosthesis. Skin staples are in place anteriorly. There is some chronic fragmentation of a superior spur. The gas is in the soft tissues shown on the postoperative radiograph have resolved. There does appear to be a likely effusion in the suprapatellar bursa, with indistinctness of local fat planes. IMPRESSION: 1. Suspected effusion in the suprapatellar bursa. 2. Right total knee prosthesis in place without complicating feature. Electronically Signed   By:  Van Clines M.D.   On: 08/20/2018 16:34    Family History Reviewed and non-contributory, no pertinent history of problems with bleeding or anesthesia      Review of Systems 14 system ROS conducted and negative except for that noted in HPI   OBJECTIVE  Vitals: Patient Vitals for the past 8 hrs:  BP Temp Temp src Pulse Resp SpO2 Height Weight  08/20/18 1600 (!) 103/59 - - (!) 111 (!) 27 96 % '5\' 9"'$  (1.753 m) -  08/20/18 1538 - 98.7 F (37.1 C) Oral (!) 124 (!) 28 96 % - -  08/20/18 1500 91/61 - - (!) 113 (!) 28 97 % - -  08/20/18 1400 119/67 - - 97 (!) 25 97 % - -  08/20/18 1300 (!) 104/59 - - 61 16 97 % - -  08/20/18 1236 (!) 87/56 - - - - - - -  08/20/18 1235 - - - - - - - 99.6 kg  08/20/18 1213 (!) 90/55 - - 61 19 96 % - -  08/20/18 1200 (!) 84/51 99.1 F (37.3 C) - 65 (!) 23 96 % - -  08/20/18 1141 - 99.1 F (37.3 C) Oral - - - - -   General: Alert, no acute distress Cardiovascular: No pedal edema Respiratory: No cyanosis, no use of accessory musculature GI: No organomegaly, abdomen is soft and non-tender Skin: No lesions in the area of chief complaint other than those listed below in MSK exam.  Neurologic: Sensation intact distally save for the below mentioned MSK exam Psychiatric: Patient is competent for consent with normal mood and affect Lymphatic: No axillary or cervical lymphadenopathy Extremities   LLE: Anterior incision consistent with total knee arthroplasty with staples in place.  Minimal redness, no induration.  Range of motion of the knee from 0 to 45 degrees without any significant pain.  Patient distally is intact motor and sensory function without significant redness or other concerns.    Test Results Imaging X-rays reviewed of the left knee 2 views demonstrating well fixed components with appropriate sizing.  No obvious abnormality.  Labs cbc Recent Labs    08/20/18 1406  WBC 38.0*  HGB 9.8*  HCT 31.3*  PLT 417*    Labs inflam No results for input(s): CRP in the last 72  hours.  Invalid input(s): ESR  Labs coag No results for input(s): INR, PTT in the last 72 hours.  Invalid input(s): PT  Recent Labs    08/20/18 1406  NA 132*  K 4.3  CL 103  CO2 19*  GLUCOSE 131*  BUN 30*  CREATININE 1.73*  CALCIUM 8.4*     ASSESSMENT AND PLAN: 82 y.o. male with the following: Left total knee arthroplasty with concern for infection in the setting of sepsis   Spoke with the patient's index surgeon and his lab results most recently from [facility] demonstrated 8800 total nucleated cells, 92% neutrophils, Gram stain was negative at that time.  Cultures are being monitored.  Based on the clinical appearance of the knee and the patient reporting that he continues to feel better we feel that monitoring is appropriate.  He does not have much in the way of an effusion currently and he continues to state-today of his knee is feeling better than it was even 3 or 4 days ago.  If the patient is able to be stabilized Dr. Samule Dry would prefer to have him transferred back if needed surgical care to [facility] so that he  may provide this service.  We will continue to monitor his care.

## 2018-08-21 ENCOUNTER — Inpatient Hospital Stay (HOSPITAL_COMMUNITY): Payer: Medicare Other

## 2018-08-21 LAB — BASIC METABOLIC PANEL
Anion gap: 8 (ref 5–15)
BUN: 26 mg/dL — ABNORMAL HIGH (ref 8–23)
CALCIUM: 8.2 mg/dL — AB (ref 8.9–10.3)
CO2: 21 mmol/L — AB (ref 22–32)
Chloride: 103 mmol/L (ref 98–111)
Creatinine, Ser: 1.76 mg/dL — ABNORMAL HIGH (ref 0.61–1.24)
GFR calc Af Amer: 40 mL/min — ABNORMAL LOW (ref >60)
GFR calc non Af Amer: 35 mL/min — ABNORMAL LOW (ref >60)
Glucose, Bld: 124 mg/dL — ABNORMAL HIGH (ref 70–99)
Potassium: 4 mmol/L (ref 3.5–5.1)
Sodium: 132 mmol/L — ABNORMAL LOW (ref 135–145)

## 2018-08-21 LAB — HEPARIN LEVEL (UNFRACTIONATED): Heparin Unfractionated: 1.74 IU/mL — ABNORMAL HIGH (ref 0.30–0.70)

## 2018-08-21 LAB — CBC
HCT: 28.3 % — ABNORMAL LOW (ref 39.0–52.0)
Hemoglobin: 8.6 g/dL — ABNORMAL LOW (ref 13.0–17.0)
MCH: 30.1 pg (ref 26.0–34.0)
MCHC: 30.4 g/dL (ref 30.0–36.0)
MCV: 99 fL (ref 80.0–100.0)
PLATELETS: 278 10*3/uL (ref 150–400)
RBC: 2.86 MIL/uL — ABNORMAL LOW (ref 4.22–5.81)
RDW: 15.1 % (ref 11.5–15.5)
WBC: 21.4 10*3/uL — ABNORMAL HIGH (ref 4.0–10.5)
nRBC: 0 % (ref 0.0–0.2)

## 2018-08-21 LAB — APTT
APTT: 57 s — AB (ref 24–36)
aPTT: 53 seconds — ABNORMAL HIGH (ref 24–36)

## 2018-08-21 LAB — MAGNESIUM: Magnesium: 2.1 mg/dL (ref 1.7–2.4)

## 2018-08-21 MED ORDER — HEPARIN (PORCINE) 25000 UT/250ML-% IV SOLN
1900.0000 [IU]/h | INTRAVENOUS | Status: DC
  Administered 2018-08-21 (×2): 1850 [IU]/h via INTRAVENOUS
  Filled 2018-08-21: qty 250

## 2018-08-21 MED ORDER — DILTIAZEM HCL-DEXTROSE 100-5 MG/100ML-% IV SOLN (PREMIX)
5.0000 mg/h | INTRAVENOUS | Status: DC
  Administered 2018-08-21 – 2018-08-22 (×2): 5 mg/h via INTRAVENOUS
  Filled 2018-08-21 (×3): qty 100

## 2018-08-21 MED ORDER — HYDROCORTISONE NA SUCCINATE PF 100 MG IJ SOLR
50.0000 mg | Freq: Four times a day (QID) | INTRAMUSCULAR | Status: DC
  Administered 2018-08-21 – 2018-08-22 (×4): 50 mg via INTRAVENOUS
  Filled 2018-08-21 (×4): qty 2

## 2018-08-21 MED ORDER — LACTATED RINGERS IV BOLUS
500.0000 mL | Freq: Once | INTRAVENOUS | Status: AC
  Administered 2018-08-21: 500 mL via INTRAVENOUS

## 2018-08-21 NOTE — Progress Notes (Signed)
S: Patient reports minimal pain.  His knee feels reasonable and he feels like it improved over the last few days.  O: Range of motion in bed to 45 degrees without limitation.  Incision appears benign and no obvious redness.  There is some pitting edema distally but this appears similar to his contralateral side and there is minimal sign of erythema no drainage.  Assessment: Concern for septic knee after total knee arthroplasty done 08/09/2018 by an outside surgeon  -Patient continues to not appear to have any obvious sign of a septic knee.   -Culture still pending from [facility]. -If patient has positive cultures or can be removed from pressors and stable he should be transferred back to [facility] and Dr. Joya Salm had agreed to provide further surgical care for this patient as he performed the index surgery.

## 2018-08-21 NOTE — H&P (Addendum)
NAME:  Don Jacobs, MRN:  [id number], DOB:  Oct 12, 1935, LOS: 1 ADMISSION DATE:  08/20/2018, CONSULTATION DATE:  11/23 REFERRING MD:  Oval Linsey EDP  --, CHIEF COMPLAINT:  Sepsis Shock   Brief History   82 yo male with OSA on CPAP and A FIB on eliquis /tikosyn with recent left Total Knee replacement transferred from [facility] 11/23 with septic shock ? Septic joint .   History of present illness   82 year old male former smoker with known history of congestive heart failure /A. fib on Eliquis, pacemaker, thoracic aortic aneurysm, OSA on CPAP, gout on daily prednisone at 2.5 mg Presented to the emergency room with fever T-max 106, altered mental status found to be hypotensive.  Patient underwent a recent left total knee replacement on August 09, 2018 by Dr. Lorin Mercy in [place].  Patient said he was doing well up until the last 4 days he started having nocturnal chills.  Noticed redness swelling and pain along the incision site.  Was seen in orthopedics office and prescribed antibiotic.  Symptoms did not improve and progress.  Labs showed elevated white count at 13,000.  With left shift elevated sed rate at 96, and acute on chronic renal failure with serum creatinine at 1.6.  Appears baseline is around 1.1 lactic acid was 1.3.  Procalcitonin was 1.5.  Flu swab was negative.  Chest x-ray showed no acute process.  Patient was started on antibiotics with Primaxin.  Patient has a penicillin allergy.  Patient required pressor support with levophed at 70mcg . Received 2L IVF .  Patient was transferred to [facility] for PCCM to admit and follow.  Patient is calm and appropriate in ICU.  Mentating well.   Past Medical History  A Fib on Eliquis , OSA on CPAP , Gout on daily prednisone 2.5mg    Significant Hospital Events   Transfer from [facility] 11/23 for septic shock  11/24 A Fib w/ RVR -  Consults:  Ortho 11/23   Procedures:  Prior to transfer 11/23 left knee  aspiration>10 cc of bloody fluid >8800 total nucleated cells, 92% neutrophils, Gram stain was negative (per ortho note)  ----------------------------------------------------------------------------   Significant Diagnostic Tests:  Left knee xray 11/23 >> suspected effusion in supratellar bursa , R total knee prosthesis in place w/o complicating feature , no gas   Venous Dopplers 11/23>>  Micro Data:  BC x 2  11/23 >>   Antimicrobials:  Primaxin 11/23> 11/23 Merropenem 11/23 >>  Vanc 11/23 >>    Interim history/subjective:  Patient says he rested well.  No confusion per patient or wife.   Temps trending down. PCT elevated.  Wear CPAP all night.  Appropriate this a.m.   Decreased pressor support.  -levophed at 14mcg A. fib with RVR overnight.   Seen by ortho , appreciate input  Decreased redness and swelling on left knee   Objective   Blood pressure (!) 106/57, pulse 69, temperature 98.8 F (37.1 C), temperature source Oral, resp. rate (!) 29, height 5\' 9"  (1.753 m), weight 102.1 kg, SpO2 93 %.        Intake/Output Summary (Last 24 hours) at 08/21/2018 0938 Last data filed at 08/21/2018 0800 Gross per 24 hour  Intake 2385.31 ml  Output 1090 ml  Net 1295.31 ml   Filed Weights   08/20/18 1235 08/21/18 0500  Weight: 99.6 kg 102.1 kg    Examination: General: Elderly male in no acute distress  HENT: [place]/AT, no JVD.   Lungs:  Diminished breath sounds in the bases with no wheezing.   Cardiovascular: Irregular regular, no MRG Abdomen: Obese, soft, nontender, positive bowel sounds  Extremities: Left knee dressing intact clean and dry.  Decreased surrounding redness.  Neuro: Alert and oriented x3.  Follows commands easily.  Moves all extremities well.  GU: Foley in place  Resolved Hospital Problem list     Assessment & Plan:  Septic Shock possibly secondary to septic joint . S/p 2L IVF bolus prior to transfer.  Recent Left total knee replacement -Red /swollen few days  after surgery .  S/p LTK aspiration on 11/23 >reported 8800 total nucleated cells, 92% neutrophils, Gram stain was negative .  11/24 >Left knee xray -small effusion ok, no gas , PCT elevated. Lactic acid nml . WBC tr down  -Titrate Levophed for map greater than 65 -ortho following , appreciate input  -follow cx data  -add stress dose steroids (cortisol 18)  -cont IV abx   A Fib on Chronic Anticoagulation with Elquis and Tikosyn  11/24 early -AFib w/ RVR -currently in NSR rate controlled  -Heparin per pharmacy protocol -cont Tikosyn .  -consult Cards if indicated.    CHF -echo September 2019 EF 40 to 45%, diffuse hypokinesis (improved from 2017)  -Caution with fluid resuscitation -hold coreg and lasix . -restart once off pressors  -tr chemistries  -  Acute on Chronic renal Failure - suspect related to underlying septic shock -hold ACE inhibitor  -cont to follow   OSA on nocturnal CPAP -Continue CPAP at bedtime  Hypomagnesium -improved  -replaced 11/23   Gout -on daily prednisone 2.5mg  ? Length of time .  -will need to resume after done with stress dose steroids .   Left Total Knee Arthroplasty 07/2018 -Dr. Samule Dry in [place] -?Septic joint  Left knee xray small effusion , no gas  - Ortho following  -cont IV abx  -Venous doppler pending     Best practice:  Diet: NPO -resume diet once off pressors  Pain/Anxiety/Delirium protocol (if indicated): VAP protocol (if indicated):  DVT prophylaxis: Eliquis at home - Hep Drip  GI prophylaxis:  Glucose control:  Mobility: BR  Code Status: Full  Family Communication: pt updated and wife updated at bedside  Disposition: ICU   Labs   CBC: Recent Labs  Lab 08/20/18 1406 08/21/18 0454  WBC 38.0* 21.4*  NEUTROABS 34.3*  --   HGB 9.8* 8.6*  HCT 31.3* 28.3*  MCV 98.1 99.0  PLT 417* 494    Basic Metabolic Panel: Recent Labs  Lab 08/20/18 1332 08/20/18 1406 08/21/18 0454  NA  --  132* 132*  K  --  4.3 4.0  CL  --   103 103  CO2  --  19* 21*  GLUCOSE  --  131* 124*  BUN  --  30* 26*  CREATININE  --  1.73* 1.76*  CALCIUM  --  8.4* 8.2*  MG 1.7 1.7 2.1   GFR: Estimated Creatinine Clearance: 39.4 mL/min (A) (by C-G formula based on SCr of 1.76 mg/dL (H)). Recent Labs  Lab 08/20/18 1406 08/20/18 1550 08/21/18 0454  PROCALCITON 29.96  --   --   WBC 38.0*  --  21.4*  LATICACIDVEN 1.2 1.5  --     Liver Function Tests: No results for input(s): AST, ALT, ALKPHOS, BILITOT, PROT, ALBUMIN in the last 168 hours. No results for input(s): LIPASE, AMYLASE in the last 168 hours. No results for input(s): AMMONIA in the last 168 hours.  ABG  Component Value Date/Time   PHART 7.391 07/23/2016 0954   PCO2ART 42.8 07/23/2016 0954   PO2ART 61.0 (L) 07/23/2016 0954   HCO3 27.1 07/23/2016 0957   TCO2 28 07/23/2016 0957   O2SAT 67.0 07/23/2016 0957     Coagulation Profile: No results for input(s): INR, PROTIME in the last 168 hours.  Cardiac Enzymes: No results for input(s): CKTOTAL, CKMB, CKMBINDEX, TROPONINI in the last 168 hours.  HbA1C: No results found for: HGBA1C  CBG: Recent Labs  Lab 08/20/18 1139  GLUCAP 97    Review of Systems:   Constitutional:   No  weight loss, night sweats,  + Fevers, chills, fatigue, or  lassitude.  HEENT:   No headaches,  Difficulty swallowing,  Tooth/dental problems, or  Sore throat,                No sneezing, itching, ear ache, nasal congestion, post nasal drip,  + dry mouth   CV:  No chest pain,  Orthopnea, PND, swelling in lower extremities, anasarca, dizziness, palpitations, syncope.   GI  No heartburn, indigestion, abdominal pain, nausea, vomiting, diarrhea, change in bowel habits,+ loss of appetite, No  bloody stools.   Resp: No shortness of breath with exertion or at rest.  No excess mucus, no productive cough,  No non-productive cough,  No coughing up of blood.  No change in color of mucus.  No wheezing.  No chest wall deformity  Skin: no rash  or lesions.  GU: no dysuria, change in color of urine, no urgency or frequency.  No flank pain, no hematuria   MS:  + Left knee redness , swelling , pain   Psych:  No change in mood or affect. No depression or anxiety.  No memory loss.       Past Medical History  He,  has a past medical history of Allergic rhinitis, Aortic aneurysm, thoracic ([place]) (05/25/2011), BPH (benign prostatic hyperplasia), Chronic systolic CHF (congestive heart failure) ([place]), Dizziness and giddiness (06/04/2015), ED (erectile dysfunction), Gout, Hearing loss, migraines, Hypertension, Hypothyroidism, Persistent atrial fibrillation, Prostatitis, chronic, Seborrheic keratosis, Sick sinus syndrome ([place]), Sleep apnea, and Spondylolisthesis (07/2010).   Surgical History    Past Surgical History:  Procedure Laterality Date  . ANTERIOR CERVICAL DECOMP/DISCECTOMY FUSION  10/2000   "C4, 5, 6 w/fusion" (02/17/2013)  . BACK SURGERY    . CARDIAC CATHETERIZATION  2000's   "once" (02/17/2013)  . CARDIAC CATHETERIZATION N/A 07/23/2016   Procedure: Right/Left Heart Cath and Coronary Angiography;  Surgeon: Jettie Booze, MD;  Location: [facility];  Service: Cardiovascular;  Laterality: N/A;  . CARDIOVERSION N/A 04/09/2014   Procedure: CARDIOVERSION;  Surgeon: Lelon Perla, MD;  Location: [facility] ENDOSCOPY;  Service: Cardiovascular;  Laterality: N/A;  . CARDIOVERSION N/A 01/17/2015   Procedure: CARDIOVERSION;  Surgeon: Larey Dresser, MD;  Location: [facility] ENDOSCOPY;  Service: Cardiovascular;  Laterality: N/A;  . FOOT SURGERY Right ~ 2007   "reconstruction" (02/17/2013)  . INSERT / REPLACE / REMOVE PACEMAKER  02/17/2013  . KNEE ARTHROSCOPY Left ~ 2007  . L5 selective nerve root block     Dr Nelva Bush  . LUMBAR SPINE SURGERY  2012   "bone graft, Dr. [name]" (02/17/2013)  . PERMANENT PACEMAKER INSERTION N/A 02/17/2013   Procedure: PERMANENT PACEMAKER INSERTION;  Surgeon: Deboraha Sprang, MD;  Location: [facility] CATH LAB;  Service:  Cardiovascular;  Laterality: N/A;  . REPAIR PERONEAL TENDONS ANKLE Right 10/2003  . SHOULDER OPEN ROTATOR CUFF REPAIR Right 09/2007; 2013  Social History   reports that he quit smoking about 37 years ago. His smoking use included cigarettes. He has a 12.00 pack-year smoking history. He has never used smokeless tobacco. He reports that he does not drink alcohol or use drugs.   Family History   His family history includes Alcohol abuse in his maternal grandfather and maternal grandmother; Emphysema in his mother; Heart attack in his father and paternal grandfather; Heart disease in his father and paternal grandfather; Rheum arthritis in his brother.   Allergies Allergies  Allergen Reactions  . Penicillins Hives, Shortness Of Breath and Rash    Ended up at ER after using Has patient had a PCN reaction causing immediate rash, facial/tongue/throat swelling, SOB or lightheadedness with hypotension: Yes Has patient had a PCN reaction causing severe rash involving mucus membranes or skin necrosis: No Has patient had a PCN reaction that required hospitalization: No Has patient had a PCN reaction occurring within the last 10 years: No If all of the above answers are "NO", then may proceed with Cephalosporin use.   Marland Kitchen Oxycodone Other (See Comments)    Altered mental status  . Ibuprofen Other (See Comments)    Is on Eliquis; not suppose to have this now     Home Medications  Prior to Admission medications   Medication Sig Start Date End Date Taking? Authorizing Provider  acetaminophen (TYLENOL) 500 MG tablet Take 500 mg by mouth every 6 (six) hours as needed (for pain.).    [provider]  allopurinol (ZYLOPRIM) 300 MG tablet Take 300 mg by mouth at bedtime.     [provider]  apixaban (ELIQUIS) 5 MG TABS tablet Take 1 tablet (5 mg total) by mouth 2 (two) times daily. 07/05/17   Sherran Needs, NP  benzonatate (TESSALON) 200 MG capsule Take 1 capsule (200 mg total) by  mouth 3 (three) times daily as needed for cough. 09/27/17   Baird Lyons D, MD  Calcium Carbonate (CALTRATE 600 PO) Take 600 mg by mouth at bedtime.     [provider]  carvedilol (COREG) 3.125 MG tablet TAKE 1 TABLET BY MOUTH TWICE DAILY 08/05/16   Deboraha Sprang, MD  Cholecalciferol (D 2000) 2000 units TABS Take 2,000 Units by mouth every evening.     [provider]  Cinnamon 500 MG capsule Take 500 mg by mouth every evening.     [provider]  colchicine 0.6 MG tablet Take 0.6 mg by mouth daily as needed (gout).     [provider]  cyanocobalamin 500 MCG tablet Take 500 mcg by mouth every evening.     [provider]  dofetilide (TIKOSYN) 250 MCG capsule Take 1 capsule (250 mcg total) by mouth 2 (two) times daily. 01/19/18   Sherran Needs, NP  furosemide (LASIX) 40 MG tablet TAKE 1 TABLET BY MOUTH DAILY. 07/21/16   Allred, Jeneen Rinks, MD  guaiFENesin (ROBITUSSIN) 100 MG/5ML SOLN Take 10 mLs by mouth every 4 (four) hours as needed for cough or to loosen phlegm.    [provider]  levothyroxine (SYNTHROID, LEVOTHROID) 112 MCG tablet Take 1 tablet (112 mcg total) by mouth daily before breakfast. 01/08/15   Eileen Stanford, PA-C  lisinopril (PRINIVIL,ZESTRIL) 5 MG tablet Take 0.5 tablets (2.5 mg total) by mouth daily. 06/19/16   Sherran Needs, NP  loratadine (CLARITIN) 10 MG tablet Take 5 mg by mouth 2 (two) times daily as needed for allergies or rhinitis.     [provider]  Magnesium 400 MG CAPS Take 400 mg by mouth 2 (two) times daily. Patient taking differently: Take 400 mg by mouth daily.  02/08/15   Sherran Needs, NP  Misc Natural Products (SAW PALMETTO) CAPS Take 1 capsule by mouth daily.    [provider]  NON FORMULARY N4- Greens vegetable capsule    [provider]  OVER THE COUNTER MEDICATION Take 1 capsule by mouth every evening. [place] Grape Seed Extract    [provider]    polyvinyl alcohol (LIQUIFILM TEARS) 1.4 % ophthalmic solution Place 1 drop into both eyes 3 (three) times daily as needed for dry eyes.    [provider]  potassium chloride SA (K-DUR,KLOR-CON) 20 MEQ tablet Take 40 mEq 2 (two) times daily by mouth.    [provider]  predniSONE (DELTASONE) 10 MG tablet Take 10 mg by mouth daily as needed (GOUT).     [provider]  vitamin C (ASCORBIC ACID) 500 MG tablet Take 500 mg by mouth daily.     [provider]     Critical care time:      Rexene Edison NP-C  [facility]  [phone]   08/21/2018

## 2018-08-21 NOTE — Progress Notes (Signed)
  Off pressors but now HR 120-130s despite tikosyn. Did respond to fluid 500cc. No resp dissues  Plan Repeat 500cc bolus Start cardizem gtt conitnue tikosyn     SIGNATURE    Dr. Brand Males, M.D., F.C.C.P,  Pulmonary and Critical Care Medicine Staff Physician, [place] Director - Interstitial Lung Disease  Program  Pulmonary [facility] [place], [place], [address]  Pager: [phone], If no answer or between  15:00h - 7:00h: call [phone] Telephone: [phone]  1:39 PM 08/21/2018

## 2018-08-21 NOTE — Progress Notes (Signed)
ANTICOAGULATION CONSULT NOTE  Pharmacy Consult for Heparin  Indication: atrial fibrillation (Apixaban on hold)   Patient Measurements: Height: 5\' 9"  (175.3 cm) Weight: 219 lb 9.3 oz (99.6 kg) IBW/kg (Calculated) : 70.7 Heparin Dosing Weight: 91 kg  Vital Signs: Temp: 99.8 F (37.7 C) (11/24 0343) Temp Source: Oral (11/24 0343) BP: 88/59 (11/24 0400) Pulse Rate: 66 (11/24 0500)  Labs: Recent Labs    08/20/18 1332 08/20/18 1406 08/20/18 1823 08/20/18 2156 08/21/18 0454 08/21/18 0456  HGB  --  9.8*  --   --  8.6*  --   HCT  --  31.3*  --   --  28.3*  --   PLT  --  417*  --   --  278  --   APTT 37*  --   --  41* 53*  --   HEPARINUNFRC  --   --  >2.20*  --   --  1.74*  CREATININE  --  1.73*  --   --  1.76*  --      Medical History: Past Medical History:  Diagnosis Date  . Allergic rhinitis   . Aortic aneurysm, thoracic ([place]) 05/25/2011  . BPH (benign prostatic hyperplasia)   . Chronic systolic CHF (congestive heart failure) ([place])   . Dizziness and giddiness 06/04/2015  . ED (erectile dysfunction)   . Gout   . Hearing loss    uses hearing a cyst  . Hx of migraines   . Hypertension   . Hypothyroidism   . Persistent atrial fibrillation   . Prostatitis, chronic   . Seborrheic keratosis   . Sick sinus syndrome ([facility])    a. s/p PPM   . Sleep apnea   . Spondylolisthesis 07/2010     Assessment: Patient transferred from outside hospital. Had total knee replacement last week. Admitted with fever, hypotension and erythema around knee. Patient is on Eliquis PTA for AFib. Last dose was yesterday evening. Checking baseline HL and aPTT. Planning to switch patient to heparin infusion incase patient requires I&D or procedure.  11/24 AM update: aPTT is low, using aPTT to dose heparin for now due to Apixaban influence on heparin levels  Goal of Therapy:  Heparin level 0.3-0.7 units/ml Monitor platelets by anticoagulation protocol: Yes   Plan:  Increase heparin to 1600  units/hr Re-check aPTT in 8 hours  Narda Bonds 08/21/2018,6:11 AM

## 2018-08-21 NOTE — Progress Notes (Signed)
ANTICOAGULATION CONSULT NOTE  Pharmacy Consult for Heparin  Indication: atrial fibrillation (Apixaban on hold)   Patient Measurements: Height: 5\' 9"  (175.3 cm) Weight: 225 lb 1.4 oz (102.1 kg) IBW/kg (Calculated) : 70.7 Heparin Dosing Weight: 91 kg  Vital Signs: Temp: 97.7 F (36.5 C) (11/24 1538) Temp Source: Oral (11/24 1538) BP: 83/58 (11/24 1700) Pulse Rate: 82 (11/24 1700)  Labs: Recent Labs    08/20/18 1406 08/20/18 1823 08/20/18 2156 08/21/18 0454 08/21/18 0456 08/21/18 1407  HGB 9.8*  --   --  8.6*  --   --   HCT 31.3*  --   --  28.3*  --   --   PLT 417*  --   --  278  --   --   APTT  --   --  41* 53*  --  57*  HEPARINUNFRC  --  >2.20*  --   --  1.74*  --   CREATININE 1.73*  --   --  1.76*  --   --      Medical History: Past Medical History:  Diagnosis Date  . Allergic rhinitis   . Aortic aneurysm, thoracic ([place]) 05/25/2011  . BPH (benign prostatic hyperplasia)   . Chronic systolic CHF (congestive heart failure) ([place])   . Dizziness and giddiness 06/04/2015  . ED (erectile dysfunction)   . Gout   . Hearing loss    uses hearing a cyst  . Hx of migraines   . Hypertension   . Hypothyroidism   . Persistent atrial fibrillation   . Prostatitis, chronic   . Seborrheic keratosis   . Sick sinus syndrome ([facility])    a. s/p PPM   . Sleep apnea   . Spondylolisthesis 07/2010     Assessment: Patient transferred from outside hospital. Had total knee replacement last week. Admitted with fever, hypotension and erythema around knee. Patient is on Eliquis PTA for AFib. Last dose was yesterday evening. Now on IVheparin infusion incase patient requires I&D or procedure.   aPTT 57 sec  Remains subtherapeutic after heparin increased to 1600 units/hr. Using aPTT to dose heparin for now due to Apixaban influence on heparin levels.  Urine noted to be dark amber but has not changed since last night. No blood noted. MD aware per RN.  Goal of Therapy:  Goal PTT 66-102  seconds  Heparin level 0.3-0.7 units/ml Monitor platelets by anticoagulation protocol: Yes   Plan:  Increase IV heparin to 1850 units/hr Re-check aPTT in 8 hours  Nicole Cella, RPh Clinical Pharmacist Please check AMION for all [place] phone numbers After 10:00 PM, call [place] [phone] 08/21/2018,6:12 PM

## 2018-08-22 ENCOUNTER — Inpatient Hospital Stay (HOSPITAL_COMMUNITY): Payer: Medicare Other

## 2018-08-22 DIAGNOSIS — Z886 Allergy status to analgesic agent status: Secondary | ICD-10-CM

## 2018-08-22 DIAGNOSIS — R609 Edema, unspecified: Secondary | ICD-10-CM

## 2018-08-22 DIAGNOSIS — L538 Other specified erythematous conditions: Secondary | ICD-10-CM

## 2018-08-22 DIAGNOSIS — H9190 Unspecified hearing loss, unspecified ear: Secondary | ICD-10-CM

## 2018-08-22 DIAGNOSIS — Z950 Presence of cardiac pacemaker: Secondary | ICD-10-CM

## 2018-08-22 DIAGNOSIS — Z885 Allergy status to narcotic agent status: Secondary | ICD-10-CM

## 2018-08-22 DIAGNOSIS — Z96652 Presence of left artificial knee joint: Secondary | ICD-10-CM

## 2018-08-22 DIAGNOSIS — I712 Thoracic aortic aneurysm, without rupture: Secondary | ICD-10-CM

## 2018-08-22 DIAGNOSIS — Z87891 Personal history of nicotine dependence: Secondary | ICD-10-CM

## 2018-08-22 DIAGNOSIS — Z880 Allergy status to penicillin: Secondary | ICD-10-CM

## 2018-08-22 DIAGNOSIS — I4891 Unspecified atrial fibrillation: Secondary | ICD-10-CM

## 2018-08-22 DIAGNOSIS — T8454XA Infection and inflammatory reaction due to internal left knee prosthesis, initial encounter: Secondary | ICD-10-CM

## 2018-08-22 DIAGNOSIS — Z7901 Long term (current) use of anticoagulants: Secondary | ICD-10-CM

## 2018-08-22 LAB — TROPONIN I: Troponin I: <0.03 ng/mL (ref <0.03)

## 2018-08-22 LAB — BASIC METABOLIC PANEL
ANION GAP: 7 (ref 5–15)
BUN: 31 mg/dL — ABNORMAL HIGH (ref 8–23)
CO2: 22 mmol/L (ref 22–32)
Calcium: 8.4 mg/dL — ABNORMAL LOW (ref 8.9–10.3)
Chloride: 103 mmol/L (ref 98–111)
Creatinine, Ser: 1.76 mg/dL — ABNORMAL HIGH (ref 0.61–1.24)
GFR calc Af Amer: 40 mL/min — ABNORMAL LOW (ref >60)
GFR, EST NON AFRICAN AMERICAN: 35 mL/min — AB (ref >60)
GLUCOSE: 123 mg/dL — AB (ref 70–99)
POTASSIUM: 4.4 mmol/L (ref 3.5–5.1)
Sodium: 132 mmol/L — ABNORMAL LOW (ref 135–145)

## 2018-08-22 LAB — CBC
HCT: 25.7 % — ABNORMAL LOW (ref 39.0–52.0)
Hemoglobin: 7.8 g/dL — ABNORMAL LOW (ref 13.0–17.0)
MCH: 29.9 pg (ref 26.0–34.0)
MCHC: 30.4 g/dL (ref 30.0–36.0)
MCV: 98.5 fL (ref 80.0–100.0)
NRBC: 0 % (ref 0.0–0.2)
PLATELETS: 234 10*3/uL (ref 150–400)
RBC: 2.61 MIL/uL — ABNORMAL LOW (ref 4.22–5.81)
RDW: 14.7 % (ref 11.5–15.5)
WBC: 13.3 10*3/uL — AB (ref 4.0–10.5)

## 2018-08-22 LAB — HEPARIN LEVEL (UNFRACTIONATED): HEPARIN UNFRACTIONATED: 1.18 [IU]/mL — AB (ref 0.30–0.70)

## 2018-08-22 LAB — MAGNESIUM: Magnesium: 1.9 mg/dL (ref 1.7–2.4)

## 2018-08-22 LAB — APTT: APTT: 68 s — AB (ref 24–36)

## 2018-08-22 MED ORDER — LEVOTHYROXINE SODIUM 112 MCG PO TABS
112.0000 ug | ORAL_TABLET | Freq: Every day | ORAL | Status: DC
  Administered 2018-08-22 – 2018-08-25 (×4): 112 ug via ORAL
  Filled 2018-08-22 (×5): qty 1

## 2018-08-22 MED ORDER — HEPARIN SODIUM (PORCINE) 5000 UNIT/ML IJ SOLN: 5000.0000 [IU] | Freq: Three times a day (TID) | INTRAMUSCULAR | Status: DC

## 2018-08-22 MED ORDER — HEPARIN SODIUM (PORCINE) 5000 UNIT/ML IJ SOLN
5000.0000 [IU] | Freq: Three times a day (TID) | INTRAMUSCULAR | Status: DC
  Administered 2018-08-22 – 2018-08-23 (×3): 5000 [IU] via SUBCUTANEOUS
  Filled 2018-08-22 (×3): qty 1

## 2018-08-22 MED ORDER — MAGNESIUM OXIDE 400 (241.3 MG) MG PO TABS
400.0000 mg | ORAL_TABLET | Freq: Two times a day (BID) | ORAL | Status: DC
  Administered 2018-08-22 – 2018-08-25 (×7): 400 mg via ORAL
  Filled 2018-08-22 (×8): qty 1

## 2018-08-22 NOTE — H&P (Signed)
NAME:  Don Jacobs, MRN:  [id number], DOB:  11-01-1935, LOS: 2 ADMISSION DATE:  08/20/2018, CONSULTATION DATE:  11/23 REFERRING MD:  Oval [facility]sey EDP  --, CHIEF COMPLAINT:  Sepsis Shock   Brief History   82 yo male with OSA on CPAP and A FIB on eliquis /tikosyn with recent left Total Knee replacement transferred from [facility] 11/23 with septic shock possibly from a septic prosthetic joint.   History of present illness   82 year old male former smoker with known history of congestive heart failure /A. fib on Eliquis, pacemaker, thoracic aortic aneurysm, OSA on CPAP, gout on daily prednisone at 2.5 mg Presented to the emergency room with fever T-max 106, altered mental status found to be hypotensive.  Patient underwent a recent left total knee replacement on August 09, 2018 by Dr. Lorin Mercy in [facility].  Patient said he was doing well up until the last 4 days he started having nocturnal chills.  Noticed redness swelling and pain along the incision site.  Was seen in orthopedics office and prescribed antibiotic.  Symptoms did not improve and progress.  Labs showed elevated white count at 13,000.  With left shift elevated sed rate at 96, and acute on chronic renal failure with serum creatinine at 1.6.  Appears baseline is around 1.1 lactic acid was 1.3.  Procalcitonin was 1.5.  Flu swab was negative.  Chest x-ray showed no acute process.  Patient was started on antibiotics with Primaxin.  Patient has a penicillin allergy.  Patient required pressor support with levophed at 59mcg . Received 2L IVF .  Patient was transferred to [facility] for PCCM to admit and follow.  Patient is calm and appropriate in ICU.  Mentating well.   Past Medical History  A Fib on Eliquis , OSA on CPAP , Gout on daily prednisone 2.5mg    Significant Hospital Events   Transfer from [facility] 11/23 for septic shock  11/24 A Fib w/ RVR -  Consults:  Ortho 11/23  ID 11/25  Procedures:  Prior  to transfer 11/23 left knee aspiration>10 cc of bloody fluid >8800 total nucleated cells, 92% neutrophils, Gram stain was negative (per ortho note)  ----------------------------------------------------------------------------   Significant Diagnostic Tests:  Left knee xray 11/23 >> suspected effusion in supratellar bursa , R total knee prosthesis in place w/o complicating feature , no gas   Venous Dopplers 11/23>>  Micro Data:  BC x 2  11/23 >>   Antimicrobials:  Primaxin 11/23> 11/23 Merropenem 11/23 >>  Vanc 11/23 >>    Interim history/subjective:  Patient says he rested well.  No confusion per patient or wife.   Temps trending down. PCT elevated.  Wear CPAP all night.  Appropriate this a.m.   Levophed is off.  A. fib now controlled.   Decreased redness and swelling on left knee   Objective   Blood pressure (!) 88/47, pulse 64, temperature 98.4 F (36.9 C), temperature source Oral, resp. rate 18, height 5\' 9"  (1.753 m), weight 101.9 kg, SpO2 95 %.        Intake/Output Summary (Last 24 hours) at 08/22/2018 1055 Last data filed at 08/22/2018 1000 Gross per 24 hour  Intake 2504.02 ml  Output 1020 ml  Net 1484.02 ml   Filed Weights   08/20/18 1235 08/21/18 0500 08/22/18 0500  Weight: 99.6 kg 102.1 kg 101.9 kg    Examination: General: Elderly male in no acute distress  HENT: [place]/AT, no JVD.   Lungs: Diminished breath sounds in  the bases with no wheezing.   Cardiovascular: Irregular regular, no MRG Abdomen: Obese, soft, nontender, positive bowel sounds  Extremities: Left knee dressing intact clean and dry.  No erythema but still edematous down to foot. No joint line tenderness. Patella is ballotable.  No warmth. Neuro: Alert and oriented x3.  Follows commands easily.  Moves all extremities well.  GU: Foley in place  Resolved Hospital Problem list   Sepsis resolved  Assessment & Plan:  Septic Shock possibly secondary to septic joint vs cellulitis alone.  No  resolved but need to settle on final duration of antibiotics.  A Fib on Chronic Anticoagulation with Elquis and Tikosyn  CHF -echo September 2019 EF 40 to 45%, diffuse hypokinesis (improved from 2017)  Acute on Chronic renal Failure - suspect related to underlying septic shock OSA on nocturnal CPAP -Continue CPAP at bedtime Hypomagnesium -improved  Gout -on daily prednisone 2.5mg  ? Length of time .  Left Total Knee Arthroplasty 07/2018 -Dr. Samule Dry in [place]  PLAN:  Continue current antibiotics with ID consultation  Continue Tikosyn Stop iv heparin. Resume Eliquis when clear no intervention on this admission. Stopped stress steroids - resume home gout dose    Best practice:  Diet: NPO -advance diet  Pain/Anxiety/Delirium protocol (if indicated): none VAP protocol (if indicated): N/A DVT prophylaxis:  [place] heparin. GI prophylaxis: N?/A Mobility: up to chair. Code Status: Full  Family Communication: pt updated and wife updated at bedside  Disposition: transfer to [facility] - Dr Thereasa Solo contacted.  Labs   CBC: Recent Labs  Lab 08/20/18 1406 08/21/18 0454 08/22/18 0428  WBC 38.0* 21.4* 13.3*  NEUTROABS 34.3*  --   --   HGB 9.8* 8.6* 7.8*  HCT 31.3* 28.3* 25.7*  MCV 98.1 99.0 98.5  PLT 417* 278 196    Basic Metabolic Panel: Recent Labs  Lab 08/20/18 1332 08/20/18 1406 08/21/18 0454 08/22/18 0428  NA  --  132* 132* 132*  K  --  4.3 4.0 4.4  CL  --  103 103 103  CO2  --  19* 21* 22  GLUCOSE  --  131* 124* 123*  BUN  --  30* 26* 31*  CREATININE  --  1.73* 1.76* 1.76*  CALCIUM  --  8.4* 8.2* 8.4*  MG 1.7 1.7 2.1 1.9   GFR: Estimated Creatinine Clearance: 39.4 mL/min (A) (by C-G formula based on SCr of 1.76 mg/dL (H)). Recent Labs  Lab 08/20/18 1406 08/20/18 1550 08/21/18 0454 08/22/18 0428  PROCALCITON 29.96  --   --   --   WBC 38.0*  --  21.4* 13.3*  LATICACIDVEN 1.2 1.5  --   --     Liver Function Tests: No results for input(s): AST, ALT, ALKPHOS, BILITOT,  PROT, ALBUMIN in the last 168 hours. No results for input(s): LIPASE, AMYLASE in the last 168 hours. No results for input(s): AMMONIA in the last 168 hours.  ABG    Component Value Date/Time   PHART 7.391 07/23/2016 0954   PCO2ART 42.8 07/23/2016 0954   PO2ART 61.0 (L) 07/23/2016 0954   HCO3 27.1 07/23/2016 0957   TCO2 28 07/23/2016 0957   O2SAT 67.0 07/23/2016 0957     Coagulation Profile: No results for input(s): INR, PROTIME in the last 168 hours.  Cardiac Enzymes: Recent Labs  Lab 08/22/18 0428  TROPONINI <0.03    HbA1C: No results found for: HGBA1C  CBG: Recent Labs  Lab 08/20/18 1139  GLUCAP 97   35 min with >50% in  counseling and coordination of care.  Kipp Brood, MD [facility] ICU Physician [place]  Pager: [phone] Mobile: [phone] After hours: [phone].  08/22/2018

## 2018-08-22 NOTE — Consult Note (Signed)
[facility] for Infectious Disease    Date of Admission:  08/20/2018   Total days of antibiotics 2 (PTA at [facility]          Day 2 meropenem / vancomycin                 Reason for Consult: Sepsis     Referring Provider: Duwayne Heck  Primary Care Provider: [name], MD   Assessment: 82 y.o. male with recent L TKR presenting with acute onset fevers with rigors within the first 5 days of his surgery. His knee swelling/erythema and warmth per his wife have improved now after having received several days of antibiotics (on oral clindamycin PTA and vancomycin/meropenem since). Very suspicious for early surgical infection/PJI. Suspect this is staph aureus - unable to add rifampin synergy d/t complicated DDI's with apixaban and Tikosyn. He will need surgical intervention to help with cure of infection; timing pending for this is TBD for now.    Plan: 1. Continue Vancomycin for presumed staph aureus infection  2. Stop Meropenem  3. Will continue to follow up with [facility] [phone]).  4. No rifampin    Principal Problem:   Infection of total left knee replacement (HCC) Active Problems:   Septic shock (HCC)   Pressure injury of skin   Cellulitis and abscess of left leg   . dofetilide  250 mcg Oral BID  . heparin injection (subcutaneous)  5,000 Units Subcutaneous Q8H  . levothyroxine  112 mcg Oral Q0600  . magnesium oxide  400 mg Oral BID  . mouth rinse  15 mL Mouth Rinse BID  . sodium chloride flush  3 mL Intravenous Q12H    HPI: Don Jacobs is a 82 y.o. male with past medical history significant for recent L TKR (08/09/18 @ [facility] by Dr. Lorin Mercy), afib on chronic apixaban and tikosyn, PPM, TAA.   He and his wife facilitate history. Was ambulating and rehabing at home when he had a witnessed/assisted fall where he twisted his knee and hip. He developed fevers by post-op day 4 to ~ 100.5 F. His wife called Dr. Narda Amber office to be seen  and was given clindamycin and compression stockings to help with swelling. This progressively worsened and became severe per his wife and was very swollen, red and hot to the touch. By ~POD 7-8 he was noted to have shaking chills at home and ongoing fevers to which she brought him in to the hospital for evaluation at [facility]. He became altered in the setting of a very high fever up to 106 F rectally. Blood cultures and urine cultures were drawn and she tells [place] that Dr. Lorin Mercy also pulled fluid from his knee but they are not certain the results. He has improved significantly per his wife's opinion following administration of antibiotics. Milta Deiters describes that the knee is "just sore" now but still with swelling that is pitting.   He was transferred to [facility] ICU on 11/23 with fevers 100.8 F and WBC count 21.4. He was started on Meropenem and Vancomycin empirically.    Micro Lab @ [facility] -  BCx 11/23 >> NGTD  Urine Cx 11/23 >> NGTD L Knee Aspirate 11/23 - NGTD @ 48h   Cell count - 8,830 WBC (92% neuts)   Gram Stain - negative (on clindamycin prior to aspirate)   ESR 96   Review of Systems  Constitutional: Positive for chills and fever. Negative for  malaise/fatigue and weight loss.  HENT: Negative for sore throat.   Respiratory: Negative for cough, sputum production and shortness of breath.   Cardiovascular: Positive for leg swelling (LLE). Negative for chest pain.  Gastrointestinal: Negative for abdominal pain, diarrhea and vomiting.  Genitourinary: Negative for dysuria and flank pain.  Musculoskeletal: Positive for joint pain (L knee as above ). Negative for myalgias and neck pain.  Skin: Negative for rash.  Neurological: Negative for dizziness, tingling and headaches.  Psychiatric/Behavioral: Negative for depression and substance abuse. The patient is not nervous/anxious and does not have insomnia.     Past Medical History:  Diagnosis Date  . Allergic rhinitis   . Aortic  aneurysm, thoracic ([place]) 05/25/2011  . BPH (benign prostatic hyperplasia)   . Chronic systolic CHF (congestive heart failure) ([place])   . Dizziness and giddiness 06/04/2015  . ED (erectile dysfunction)   . Gout   . Hearing loss    uses hearing a cyst  . Hx of migraines   . Hypertension   . Hypothyroidism   . Persistent atrial fibrillation   . Prostatitis, chronic   . Seborrheic keratosis   . Sick sinus syndrome (HCC)    a. s/p PPM   . Sleep apnea   . Spondylolisthesis 07/2010    Social History   Tobacco Use  . Smoking status: Former Smoker    Packs/day: 1.00    Years: 12.00    Pack years: 12.00    Types: Cigarettes    Last attempt to quit: 03/11/1981    Years since quitting: 37.4  . Smokeless tobacco: Never Used  Substance Use Topics  . Alcohol use: No  . Drug use: No    Family History  Problem Relation Age of Onset  . Heart disease Father   . Heart attack Father   . Emphysema Mother   . Alcohol abuse Maternal Grandmother   . Alcohol abuse Maternal Grandfather   . Heart attack Paternal Grandfather   . Heart disease Paternal Grandfather   . Rheum arthritis Brother    Allergies  Allergen Reactions  . Penicillins Hives, Shortness Of Breath and Rash    Ended up at ER after using Has patient had a PCN reaction causing immediate rash, facial/tongue/throat swelling, SOB or lightheadedness with hypotension: Yes Has patient had a PCN reaction causing severe rash involving mucus membranes or skin necrosis: No Has patient had a PCN reaction that required hospitalization: No Has patient had a PCN reaction occurring within the last 10 years: No If all of the above answers are "NO", then may proceed with Cephalosporin use.   Marland Kitchen Oxycodone Other (See Comments)    Altered mental status  . Ibuprofen Other (See Comments)    Is on Eliquis; not suppose to have this now    OBJECTIVE: Blood pressure (!) 87/52, pulse 65, temperature 98.4 F (36.9 C), temperature source Oral, resp.  rate (!) 24, height '5\' 9"'$  (1.753 m), weight 101.9 kg, SpO2 95 %.  Physical Exam  Constitutional: He is oriented to person, place, and time. He appears well-developed and well-nourished.  Resting in bed on CPAP mask. Wife at the bedside.   HENT:  Head: Normocephalic.  Mouth/Throat: No oropharyngeal exudate.  Very HOH  Eyes: Pupils are equal, round, and reactive to light. No scleral icterus.  Cardiovascular: Normal rate and normal heart sounds.  No murmur heard. PPM spikes noted. Afib on telemetry   Pulmonary/Chest: Effort normal. No respiratory distress. He has no rales.  Abdominal: Soft. Bowel  sounds are normal. He exhibits no distension.  Musculoskeletal: He exhibits edema (3+ edema LLE through knee extending to thigh).  L knee with dressing in place. Clean and w/o drainage. Warmth that is equal to RLE. Swelling L>R. No pain with palpation   Lymphadenopathy:    He has no cervical adenopathy.  Neurological: He is alert and oriented to person, place, and time.  Skin: Skin is warm and dry. Capillary refill takes less than 2 seconds. No erythema.  Psychiatric: He has a normal mood and affect. Thought content normal.    Lab Results Lab Results  Component Value Date   WBC 13.3 (H) 08/22/2018   HGB 7.8 (L) 08/22/2018   HCT 25.7 (L) 08/22/2018   MCV 98.5 08/22/2018   PLT 234 08/22/2018    Lab Results  Component Value Date   CREATININE 1.76 (H) 08/22/2018   BUN 31 (H) 08/22/2018   NA 132 (L) 08/22/2018   K 4.4 08/22/2018   CL 103 08/22/2018   CO2 22 08/22/2018    Lab Results  Component Value Date   ALT 12 (L) 01/29/2016   AST 18 01/29/2016   ALKPHOS 61 01/29/2016   BILITOT 0.9 01/29/2016     Microbiology: Recent Results (from the past 240 hour(s))  MRSA PCR Screening     Status: None   Collection Time: 08/20/18 11:43 AM  Result Value Ref Range Status   MRSA by PCR NEGATIVE NEGATIVE Final    Comment:        The GeneXpert MRSA Assay (FDA approved for NASAL  specimens only), is one component of a comprehensive MRSA colonization surveillance program. It is not intended to diagnose MRSA infection nor to guide or monitor treatment for MRSA infections. Performed at [facility], [place] [address], [place], [place] [address]   Culture, blood (routine x 2)     Status: None (Preliminary result)   Collection Time: 08/20/18  3:50 PM  Result Value Ref Range Status   Specimen Description BLOOD LEFT HAND  Final   Special Requests   Final    BOTTLES DRAWN AEROBIC ONLY Blood Culture adequate volume   Culture   Final    NO GROWTH 2 DAYS Performed at [facility], [place] [address] [place], [place] [address]    Report Status PENDING  Incomplete  Culture, blood (routine x 2)     Status: None (Preliminary result)   Collection Time: 08/20/18  3:57 PM  Result Value Ref Range Status   Specimen Description BLOOD LEFT HAND  Final   Special Requests   Final    BOTTLES DRAWN AEROBIC ONLY Blood Culture adequate volume   Culture   Final    NO GROWTH 2 DAYS Performed at [facility], [place] [address] [place],  [address]    Report Status PENDING  Incomplete    Janene Madeira, MSN, NP-C [place] for Infectious [place] Cell: [phone] Pager: [phone]  08/22/2018 10:25 AM

## 2018-08-22 NOTE — Progress Notes (Signed)
ANTICOAGULATION CONSULT NOTE  Pharmacy Consult for Heparin  Indication: atrial fibrillation (Apixaban on hold)   Patient Measurements: Height: 5\' 9"  (175.3 cm) Weight: 224 lb 10.4 oz (101.9 kg) IBW/kg (Calculated) : 70.7 Heparin Dosing Weight: 91 kg  Vital Signs: Temp: 98.4 F (36.9 C) (11/25 0710) Temp Source: Oral (11/25 0710) BP: 97/61 (11/25 0700) Pulse Rate: 66 (11/25 0700)  Labs: Recent Labs    08/20/18 1406 08/20/18 1823  08/21/18 0454 08/21/18 0456 08/21/18 1407 08/22/18 0221 08/22/18 0428  HGB 9.8*  --   --  8.6*  --   --   --  7.8*  HCT 31.3*  --   --  28.3*  --   --   --  25.7*  PLT 417*  --   --  278  --   --   --  234  APTT  --   --    < > 53*  --  57* 68*  --   HEPARINUNFRC  --  >2.20*  --   --  1.74*  --   --  1.18*  CREATININE 1.73*  --   --  1.76*  --   --   --  1.76*  TROPONINI  --   --   --   --   --   --   --  <0.03   < > = values in this interval not displayed.    Assessment: Patient transferred from outside hospital. Had total knee replacement last week. Admitted with fever, hypotension and erythema around knee. Patient is on Eliquis PTA for AFib. Last dose was yesterday evening. Checking baseline HL and aPTT. Planning to switch patient to heparin infusion incase patient requires I&D or procedure.  aptt just within range 68  Goal of Therapy:  APTT 66-102 secs Heparin level 0.3-0.7 units/ml Monitor platelets by anticoagulation protocol: Yes   Plan:  Increase heparin to 1900 units/hr Next lvl 1600  Levester Fresh, PharmD, BCPS, BCCCP Clinical Pharmacist [phone]  Please check AMION for all [place] numbers  08/22/2018 8:22 AM

## 2018-08-22 NOTE — Progress Notes (Signed)
[facility] Infusion Coordinator will follow pt with ID to support home infusion pharmacy services if home is DC disposition.   If patient discharges after hours, please call [phone].   Larry Sierras 08/22/2018, 5:10 PM

## 2018-08-22 NOTE — Progress Notes (Signed)
S: Patient reports minimal pain.  He continues to improve  O: Range of motion in bed to 45 degrees without limitation.  Leg appears benign and no obvious redness.  There is some pitting edema distally but improved since yesterday.  Assessment: Concern for septic knee after total knee arthroplasty done 08/09/2018 by an outside surgeon, improving condition  -Patient continues to not appear to have any obvious sign of a septic knee.   -Culture NGTD from [place] as of 11/24 evening -If stable would recommend transfer back to [place] for index surgeon and team to continue monitoring and management if not stable for dc home alternatively with outpatient followup with index surgeon.

## 2018-08-22 NOTE — Progress Notes (Addendum)
ANTICOAGULATION CONSULT NOTE  Pharmacy Consult for Heparin  Indication: atrial fibrillation (Apixaban on hold)   Patient Measurements: Height: 5\' 9"  (175.3 cm) Weight: 225 lb 1.4 oz (102.1 kg) IBW/kg (Calculated) : 70.7 Heparin Dosing Weight: 91 kg  Vital Signs: Temp: 98.9 F (37.2 C) (11/24 2348) Temp Source: Oral (11/24 2348) BP: 80/59 (11/25 0300) Pulse Rate: 49 (11/25 0300)  Labs: Recent Labs    08/20/18 1406 08/20/18 1823  08/21/18 0454 08/21/18 0456 08/21/18 1407 08/22/18 0221  HGB 9.8*  --   --  8.6*  --   --   --   HCT 31.3*  --   --  28.3*  --   --   --   PLT 417*  --   --  278  --   --   --   APTT  --   --    < > 53*  --  57* 68*  HEPARINUNFRC  --  >2.20*  --   --  1.74*  --   --   CREATININE 1.73*  --   --  1.76*  --   --   --    < > = values in this interval not displayed.     Medical History: Past Medical History:  Diagnosis Date  . Allergic rhinitis   . Aortic aneurysm, thoracic ([place]) 05/25/2011  . BPH (benign prostatic hyperplasia)   . Chronic systolic CHF (congestive heart failure) ([place])   . Dizziness and giddiness 06/04/2015  . ED (erectile dysfunction)   . Gout   . Hearing loss    uses hearing a cyst  . Hx of migraines   . Hypertension   . Hypothyroidism   . Persistent atrial fibrillation   . Prostatitis, chronic   . Seborrheic keratosis   . Sick sinus syndrome (HCC)    a. s/p PPM   . Sleep apnea   . Spondylolisthesis 07/2010     Assessment: Patient transferred from outside hospital. Had total knee replacement last week. Admitted with fever, hypotension and erythema around knee. Patient is on Eliquis PTA for AFib. Last dose was yesterday evening. Checking baseline HL and aPTT. Planning to switch patient to heparin infusion incase patient requires I&D or procedure.  11/24 AM update: aPTT is therapeutic x 1 after rate increase, using aPTT to dose heparin for now due to Apixaban influence on heparin levels  Goal of Therapy:  APTT  66-102 secs Heparin level 0.3-0.7 units/ml Monitor platelets by anticoagulation protocol: Yes   Plan:  Cont heparin at 1850 units/hr Confirmatory aPTT in 8 hours  Narda Bonds 08/22/2018,3:39 AM

## 2018-08-22 NOTE — Progress Notes (Signed)
VASCULAR LAB PRELIMINARY  PRELIMINARY  PRELIMINARY  PRELIMINARY  Bilateral lower extremity venous duplex completed.    Preliminary report:  There is no obvious evidence of DVT or SVT noted in the bilateral lower extremities.  Interstitial fluid noted throughout the left calf.  Suzi Hernan, RVT 08/22/2018, 8:50 AM

## 2018-08-23 ENCOUNTER — Inpatient Hospital Stay: Payer: Self-pay

## 2018-08-23 DIAGNOSIS — T8454XD Infection and inflammatory reaction due to internal left knee prosthesis, subsequent encounter: Secondary | ICD-10-CM

## 2018-08-23 DIAGNOSIS — I480 Paroxysmal atrial fibrillation: Secondary | ICD-10-CM

## 2018-08-23 DIAGNOSIS — T8454XA Infection and inflammatory reaction due to internal left knee prosthesis, initial encounter: Principal | ICD-10-CM

## 2018-08-23 DIAGNOSIS — N179 Acute kidney failure, unspecified: Secondary | ICD-10-CM

## 2018-08-23 DIAGNOSIS — Z95828 Presence of other vascular implants and grafts: Secondary | ICD-10-CM

## 2018-08-23 LAB — CBC
HCT: 25.2 % — ABNORMAL LOW (ref 39.0–52.0)
Hemoglobin: 7.9 g/dL — ABNORMAL LOW (ref 13.0–17.0)
MCH: 30.5 pg (ref 26.0–34.0)
MCHC: 31.3 g/dL (ref 30.0–36.0)
MCV: 97.3 fL (ref 80.0–100.0)
Platelets: 267 10*3/uL (ref 150–400)
RBC: 2.59 MIL/uL — ABNORMAL LOW (ref 4.22–5.81)
RDW: 14.7 % (ref 11.5–15.5)
WBC: 8.3 10*3/uL (ref 4.0–10.5)
nRBC: 0 % (ref 0.0–0.2)

## 2018-08-23 LAB — BASIC METABOLIC PANEL
Anion gap: 9 (ref 5–15)
BUN: 49 mg/dL — AB (ref 8–23)
CO2: 22 mmol/L (ref 22–32)
CREATININE: 1.9 mg/dL — AB (ref 0.61–1.24)
Calcium: 8.3 mg/dL — ABNORMAL LOW (ref 8.9–10.3)
Chloride: 102 mmol/L (ref 98–111)
GFR calc Af Amer: 37 mL/min — ABNORMAL LOW (ref >60)
GFR, EST NON AFRICAN AMERICAN: 32 mL/min — AB (ref >60)
GLUCOSE: 92 mg/dL (ref 70–99)
Potassium: 3.8 mmol/L (ref 3.5–5.1)
SODIUM: 133 mmol/L — AB (ref 135–145)

## 2018-08-23 LAB — MAGNESIUM: Magnesium: 2.1 mg/dL (ref 1.7–2.4)

## 2018-08-23 MED ORDER — LEVOTHYROXINE SODIUM 112 MCG PO TABS: 112.0000 ug | ORAL_TABLET | Freq: Every day | ORAL | Status: DC

## 2018-08-23 MED ORDER — DOFETILIDE 250 MCG PO CAPS: 250.0000 ug | ORAL_CAPSULE | Freq: Two times a day (BID) | ORAL | Status: DC

## 2018-08-23 MED ORDER — SODIUM CHLORIDE 0.9 % IV SOLN
670.0000 mg | Freq: Every day | INTRAVENOUS | Status: DC
  Administered 2018-08-23: 670 mg via INTRAVENOUS
  Filled 2018-08-23 (×2): qty 13.4

## 2018-08-23 MED ORDER — POTASSIUM CHLORIDE CRYS ER 20 MEQ PO TBCR
20.0000 meq | EXTENDED_RELEASE_TABLET | Freq: Once | ORAL | Status: AC
  Administered 2018-08-23: 20 meq via ORAL
  Filled 2018-08-23: qty 1

## 2018-08-23 MED ORDER — ACETAMINOPHEN 325 MG PO TABS: 650.0000 mg | ORAL_TABLET | Freq: Four times a day (QID) | ORAL | Status: DC | PRN

## 2018-08-23 MED ORDER — METOPROLOL TARTRATE 5 MG/5ML IV SOLN
5.0000 mg | Freq: Once | INTRAVENOUS | Status: AC
  Administered 2018-08-23: 5 mg via INTRAVENOUS
  Filled 2018-08-23: qty 5

## 2018-08-23 MED ORDER — SODIUM CHLORIDE 0.9% FLUSH
10.0000 mL | Freq: Two times a day (BID) | INTRAVENOUS | Status: DC
  Administered 2018-08-23 – 2018-08-24 (×4): 10 mL

## 2018-08-23 MED ORDER — ALLOPURINOL 300 MG PO TABS
300.0000 mg | ORAL_TABLET | Freq: Every day | ORAL | Status: DC
  Administered 2018-08-23 – 2018-08-24 (×2): 300 mg via ORAL
  Filled 2018-08-23 (×3): qty 1

## 2018-08-23 MED ORDER — SODIUM CHLORIDE 0.9 % IV SOLN
670.0000 mg | Freq: Every day | INTRAVENOUS | Status: DC
  Filled 2018-08-23: qty 13.4

## 2018-08-23 MED ORDER — CHLORHEXIDINE GLUCONATE CLOTH 2 % EX PADS
6.0000 | MEDICATED_PAD | Freq: Every day | CUTANEOUS | Status: DC
  Administered 2018-08-23 – 2018-08-24 (×2): 6 via TOPICAL

## 2018-08-23 MED ORDER — APIXABAN 5 MG PO TABS
5.0000 mg | ORAL_TABLET | Freq: Two times a day (BID) | ORAL | Status: DC
  Administered 2018-08-23 – 2018-08-24 (×3): 5 mg via ORAL
  Filled 2018-08-23 (×4): qty 1

## 2018-08-23 MED ORDER — CARVEDILOL 3.125 MG PO TABS
3.1250 mg | ORAL_TABLET | Freq: Two times a day (BID) | ORAL | Status: DC
  Administered 2018-08-23 – 2018-08-25 (×5): 3.125 mg via ORAL
  Filled 2018-08-23 (×6): qty 1

## 2018-08-23 MED ORDER — SODIUM CHLORIDE 0.9% FLUSH
10.0000 mL | INTRAVENOUS | Status: DC | PRN
  Administered 2018-08-24: 10 mL
  Filled 2018-08-23: qty 40

## 2018-08-23 MED ORDER — DOFETILIDE 250 MCG PO CAPS
250.0000 ug | ORAL_CAPSULE | Freq: Two times a day (BID) | ORAL | Status: DC
  Administered 2018-08-23 – 2018-08-25 (×5): 250 ug via ORAL
  Filled 2018-08-23 (×7): qty 1

## 2018-08-23 MED ORDER — SODIUM CHLORIDE 0.9 % IV SOLN
800.0000 mg | Freq: Every day | INTRAVENOUS | Status: DC
  Filled 2018-08-23: qty 16

## 2018-08-23 NOTE — Evaluation (Signed)
Physical Therapy Evaluation Patient Details Name: Don Jacobs MRN: [id number] DOB: 04-05-1936 Today's Date: 08/23/2018   History of Present Illness  82 y.o. male admitted on 08/20/18 for fever, AMS, and hypotension.  Pt with recent history of L TKA on 08/09/18.  Dx with septic shock (joint infection vs cellulitis), a-fib with prolonged QTC (cardiology consulted).  Pt with other significant PMH of R TKA, spondylolisthesis s/p lumbar spine surgery, SSS, PAF, HTN, HOH, gout, chornic systolic CHF, AA-thoracic, R shoulder RTC repair, R ankle peroneal tendon repair, and ACDF.     Clinical Impression  Pt was able to get up and walk a short distance into the hallway with the RW with one person assist.  He completed his TKA exercises in the bed and we sat up after our walk.  RN reporting post walk some higher HRs that look to be in A-fib (130s).  BP and O2 stable during mobility.  Pt would benefit from resuming HH therapies again at discharge.  PT to follow acutely for deficits listed below.    Follow Up Recommendations Home health PT;Supervision for mobility/OOB    Equipment Recommendations  None recommended by PT    Recommendations for Other Services   NA     Precautions / Restrictions Restrictions LLE Weight Bearing: (unsure of WB status- no orders)      Mobility  Bed Mobility Overal bed mobility: Needs Assistance Bed Mobility: Supine to Sit     Supine to sit: Min guard     General bed mobility comments: Min guard assist for safety and extra time needed to get EOB.    Transfers Overall transfer level: Needs assistance Equipment used: Rolling walker (2 wheeled) Transfers: Sit to/from Stand Sit to Stand: Min guard         General transfer comment: Min guard assist from elevated bed, mostly to stablize RW for transition of hands, pt needed extra time to get to standing as well.   Ambulation/Gait Ambulation/Gait assistance: Min guard Gait Distance (Feet): 45 Feet Assistive  device: Rolling walker (2 wheeled) Gait Pattern/deviations: Step-through pattern;Antalgic Gait velocity: decreased Gait velocity interpretation: 1.31 - 2.62 ft/sec, indicative of limited community ambulator General Gait Details: Mildly antalgic gait pattern, verbal cues for upright posture, slow speed.          Balance Overall balance assessment: Needs assistance Sitting-balance support: Feet supported;No upper extremity supported Sitting balance-Leahy Scale: Good     Standing balance support: Bilateral upper extremity supported Standing balance-Leahy Scale: Poor Standing balance comment: needs external support from RW and therapist.                              Pertinent Vitals/Pain Pain Assessment: Faces Faces Pain Scale: Hurts even more Pain Location: L knee with end ROM Pain Descriptors / Indicators: Grimacing;Guarding Pain Intervention(s): Limited activity within patient's tolerance;Monitored during session;Repositioned    Home Living Family/patient expects to be discharged to:: Private residence Living Arrangements: Spouse/significant other Available Help at Discharge: Family;Available 24 hours/day Type of Home: House Home Access: Stairs to enter Entrance Stairs-Rails: Right;Left;Can reach both Entrance Stairs-Number of Steps: 7 Home Layout: One level Home Equipment: Walker - 2 wheels;Bedside commode      Prior Function Level of Independence: Needs assistance   Gait / Transfers Assistance Needed: supervision for gait with RW              Extremity/Trunk Assessment   Upper Extremity Assessment Upper Extremity Assessment: Overall  WFL for tasks assessed    Lower Extremity Assessment Lower Extremity Assessment: LLE deficits/detail LLE Deficits / Details: left leg with decreased L ankle ROM (lacking ~5 degrees of neutral dorsiflexion (pt reports he feels like he is falling backward when he stands and this may be part of why he feels this way),  decreased knee extension ROM ~15-80 degrees of flexion at the knee, and normal hip ROM.  Strength is 3-/5, ankle, knee, hip    Cervical / Trunk Assessment Cervical / Trunk Assessment: Other exceptions Cervical / Trunk Exceptions: h/o neck and back surgery  Communication   Communication: HOH  Cognition Arousal/Alertness: Awake/alert Behavior During Therapy: WFL for tasks assessed/performed Overall Cognitive Status: Within Functional Limits for tasks assessed                                 General Comments: some mis communication, but I think this has to do with his hearing difficulty.       General Comments      Exercises Total Joint Exercises Ankle Circles/Pumps: AROM;Left;20 reps Quad Sets: AROM;Left;10 reps Heel Slides: AAROM;Left;10 reps Hip ABduction/ADduction: AROM;Left;10 reps Goniometric ROM: ~15-80(per gross assessment)   Assessment/Plan    PT Assessment Patient needs continued PT services  PT Problem List Decreased strength;Decreased range of motion;Decreased activity tolerance;Decreased balance;Decreased mobility;Decreased knowledge of use of DME;Decreased knowledge of precautions;Cardiopulmonary status limiting activity;Pain       PT Treatment Interventions DME instruction;Gait training;Stair training;Therapeutic activities;Functional mobility training;Therapeutic exercise;Balance training;Patient/family education;Manual techniques;Modalities    PT Goals (Current goals can be found in the Care Plan section)  Acute Rehab PT Goals Patient Stated Goal: to get back to water aerobics PT Goal Formulation: With patient Time For Goal Achievement: 09/06/18 Potential to Achieve Goals: Good    Frequency Min 3X/week    AM-PAC PT "6 Clicks" Mobility  Outcome Measure Help needed turning from your back to your side while in a flat bed without using bedrails?: A Little Help needed moving from lying on your back to sitting on the side of a flat bed without  using bedrails?: A Little Help needed moving to and from a bed to a chair (including a wheelchair)?: A Little Help needed standing up from a chair using your arms (e.g., wheelchair or bedside chair)?: A Little Help needed to walk in hospital room?: A Little Help needed climbing 3-5 steps with a railing? : A Little 6 Click Score: 18    End of Session Equipment Utilized During Treatment: Gait belt Activity Tolerance: Patient tolerated treatment well Patient left: in chair;with call bell/phone within reach;with chair alarm set Nurse Communication: Mobility status PT Visit Diagnosis: Muscle weakness (generalized) (M62.81);Difficulty in walking, not elsewhere classified (R26.2);Pain Pain - Right/Left: Left Pain - part of body: Knee    Time: 4827-0786 PT Time Calculation (min) (ACUTE ONLY): 36 min   Charges:   PT Evaluation $PT Eval Moderate Complexity: 1 Mod PT Treatments $Gait Training: 8-22 mins         Bernard Donahoo B. Jumar Greenstreet, PT, DPT  Acute Rehabilitation [phone] pager #[phone] office   08/23/2018, 5:46 PM

## 2018-08-23 NOTE — Care Management Note (Signed)
Case Management Note  Patient Details  Name: MERVYN PFLAUM MRN: [id number] Date of Birth: 1936/03/25  Subjective/Objective:     Pt admitted with septic shock secondary to septic joint and cellulitis               Action/Plan:  PTA independent from home with wife.  [place] consulted by ID group for home IV antibiotics - [facility] informed CM that pt does not currently have active prescription coverage via medicare nor BCBS.  Pt informed that he is active with the [facility] - pt informed CM that he gets his prescriptions filled at the [facility] clinic.  CM left VM for transfer coordinator, [facility] and [facility].  CM also contacted the [facility] clinic to inform of admit.  CM also contacted [facility] [place] and left VM. CM provided attending [facility] pharmacy fax number [phone]    Expected Discharge Date:                  Expected Discharge Plan:  Home/Self Care  In-House Referral:     Discharge planning Services  CM Consult  Post Acute Care Choice:    Choice offered to:     DME Arranged:    DME Agency:     HH Arranged:    [place] Agency:     Status of Service:  In process, will continue to follow  If discussed at Long Length of Stay Meetings, dates discussed:    Additional Comments:  Maryclare Labrador, RN 08/23/2018, 4:11 PM

## 2018-08-23 NOTE — Plan of Care (Signed)

## 2018-08-23 NOTE — Progress Notes (Addendum)
[facility] for Infectious Disease  Date of Admission:  08/20/2018   Total days of antibiotics 3 vancomycin          Patient ID: Don Jacobs is a 82 y.o. male with  Principal Problem:   Infection of total left knee replacement (HCC) Active Problems:   Septic shock ([place])   Pressure injury of skin   Cellulitis and abscess of left leg   . dofetilide  250 mcg Oral BID  . heparin injection (subcutaneous)  5,000 Units Subcutaneous Q8H  . levothyroxine  112 mcg Oral Q0600  . magnesium oxide  400 mg Oral BID  . mouth rinse  15 mL Mouth Rinse BID  . sodium chloride flush  3 mL Intravenous Q12H    SUBJECTIVE: Feeling fine today. He is in rapid AFib but does not feel like he normally feels when he flips into it. Ready to get home. Tells me Dr. Olin Pia team will be in to evaluate him.    Allergies  Allergen Reactions  . Penicillins Hives, Shortness Of Breath and Rash    Ended up at ER after using Has patient had a PCN reaction causing immediate rash, facial/tongue/throat swelling, SOB or lightheadedness with hypotension: Yes Has patient had a PCN reaction causing severe rash involving mucus membranes or skin necrosis: No Has patient had a PCN reaction that required hospitalization: No Has patient had a PCN reaction occurring within the last 10 years: No If all of the above answers are "NO", then may proceed with Cephalosporin use.   Marland Kitchen Oxycodone Other (See Comments)    Altered mental status  . Ibuprofen Other (See Comments)    Is on Eliquis; not suppose to have this now    OBJECTIVE: Vitals:   08/23/18 0500 08/23/18 0600 08/23/18 0754 08/23/18 0800  BP: 93/64 (!) 94/59  112/82  Pulse: 63 (!) 41  (!) 120  Resp: 19 20  (!) 25  Temp:   98.8 F (37.1 C)   TempSrc:   Oral   SpO2: 95% 91%  94%  Weight: 101.9 kg     Height:       Body mass index is 33.17 kg/m.  Physical Exam  Constitutional: He is oriented to person, place, and time. He appears  well-developed and well-nourished.  Resting in bed comfortably.   Eyes: Pupils are equal, round, and reactive to light. No scleral icterus.  Neck:  CV catheter in R IJ   Cardiovascular: Normal heart sounds and normal pulses. An irregularly irregular rhythm present.  Pulmonary/Chest: Effort normal. No respiratory distress.  Abdominal: Soft.  Musculoskeletal: He exhibits edema (2-3+ edema L>R).  Lymphadenopathy:    He has no cervical adenopathy.  Neurological: He is alert and oriented to person, place, and time.  Skin: Skin is warm and dry. No rash noted. No erythema.  Psychiatric: He has a normal mood and affect.  Vitals reviewed.   Lab Results Lab Results  Component Value Date   WBC 8.3 08/23/2018   HGB 7.9 (L) 08/23/2018   HCT 25.2 (L) 08/23/2018   MCV 97.3 08/23/2018   PLT 267 08/23/2018    Lab Results  Component Value Date   CREATININE 1.90 (H) 08/23/2018   BUN 49 (H) 08/23/2018   NA 133 (L) 08/23/2018   K 3.8 08/23/2018   CL 102 08/23/2018   CO2 22 08/23/2018    Lab Results  Component Value Date   ALT 12 (L) 01/29/2016  AST 18 01/29/2016   ALKPHOS 61 01/29/2016   BILITOT 0.9 01/29/2016     Microbiology: Recent Results (from the past 240 hour(s))  MRSA PCR Screening     Status: None   Collection Time: 08/20/18 11:43 AM  Result Value Ref Range Status   MRSA by PCR NEGATIVE NEGATIVE Final    Comment:        The GeneXpert MRSA Assay (FDA approved for NASAL specimens only), is one component of a comprehensive MRSA colonization surveillance program. It is not intended to diagnose MRSA infection nor to guide or monitor treatment for MRSA infections. Performed at [facility], [place] [address], [place], [place] [address]   Culture, blood (routine x 2)     Status: None (Preliminary result)   Collection Time: 08/20/18  3:50 PM  Result Value Ref Range Status   Specimen Description BLOOD LEFT HAND  Final   Special Requests   Final    BOTTLES DRAWN  AEROBIC ONLY Blood Culture adequate volume   Culture   Final    NO GROWTH 3 DAYS Performed at [facility], [place] [address], [place], [place] [address]    Report Status PENDING  Incomplete  Culture, blood (routine x 2)     Status: None (Preliminary result)   Collection Time: 08/20/18  3:57 PM  Result Value Ref Range Status   Specimen Description BLOOD LEFT HAND  Final   Special Requests   Final    BOTTLES DRAWN AEROBIC ONLY Blood Culture adequate volume   Culture   Final    NO GROWTH 3 DAYS Performed at [facility], [place] [address], [place], [place] [address]    Report Status PENDING  Incomplete   ASSESSMENT & PLAN:  1. Sepsis = presumably due to his L TKR; gram stain negative and no growth on cultures from knee aspirate from [facility]. Likely negative d/t the fact he was on clindamycin 4d prior to procedure. With AKI will switch to daptomycin 8 mg/kg/day x 6 weeks with consideration of oral therapy thereafter considering potential source being his L knee PJI. Will need discussion with surgery team as well. Case Management will need to be consulted for home health abx.   2. Vascular Access = with eGFR 36.5 mL/min he will likely need a tunneled PICC line.   3. Afib with RVR = tolerating well at rest. Per cardiology.   4. Medication Monitoring = Creatinine 1.13 in September now up to 1.9 in setting of sepsis and nephrotoxic agents. Continue to monitor after switch to dapto. Check CK in AM and weekly while on therapy.   Janene Madeira, MSN, NP-C [facility] for Infectious [place] Cell: [phone] Pager: [phone]  08/23/2018  10:35 AM

## 2018-08-23 NOTE — Progress Notes (Signed)
Pt came to unit as a transfer from 70M , comfortable in bed, alert and oriented x4 , care continues.

## 2018-08-23 NOTE — Progress Notes (Signed)
Peripherally Inserted Central Catheter/Midline Placement  The IV Nurse has discussed with the patient and/or persons authorized to consent for the patient, the purpose of this procedure and the potential benefits and risks involved with this procedure.  The benefits include less needle sticks, lab draws from the catheter, and the patient may be discharged home with the catheter. Risks include, but not limited to, infection, bleeding, blood clot (thrombus formation), and puncture of an artery; nerve damage and irregular heartbeat and possibility to perform a PICC exchange if needed/ordered by physician.  Alternatives to this procedure were also discussed.  Bard Power PICC patient education guide, fact sheet on infection prevention and patient information card has been provided to patient /or left at bedside.    PICC/Midline Placement Documentation  PICC Single Lumen 77/11/65 PICC Right Basilic 44 cm 1 cm (Active)  Indication for Insertion or Continuance of Line Prolonged intravenous therapies 08/23/2018  2:50 PM  Exposed Catheter (cm) 1 cm 08/23/2018  2:50 PM  Site Assessment Clean;Dry;Intact 08/23/2018  2:50 PM  Line Status Infusing;Flushed;No blood return 08/23/2018  2:50 PM  Dressing Type Transparent 08/23/2018  2:50 PM  Dressing Status Intact;Dry;Clean;Antimicrobial disc in place 08/23/2018  2:50 PM  Line Adjustment (NICU/IV Team Only) Yes 08/23/2018  2:50 PM  Dressing Intervention New dressing 08/23/2018  2:50 PM  Dressing Change Due 08/30/18 08/23/2018  2:50 PM     Consent signed by wife at bedside per pt request Synthia Innocent 08/23/2018, 2:53 PM

## 2018-08-23 NOTE — Progress Notes (Signed)
Patient has home unit CPAP. Patient places himself on and off when he wants without any assistance.

## 2018-08-23 NOTE — Progress Notes (Signed)
Patient A. Fib w/ HR 100-145. BP 100/55  MAP (70)  Asymptomatic. ELink notified.

## 2018-08-23 NOTE — Progress Notes (Signed)
p wave progression with increase to "max P" then decrease observed with PICC insertion using ECG.  Carolee Rota, RN VAST

## 2018-08-23 NOTE — Progress Notes (Signed)
[place] TEAM 1 - Stepdown/ICU TEAM  Don Jacobs  EGB:[id number] DOB: [birth year] DOA: 08/20/2018 PCP: [name], MD    Brief Narrative:  82 year old male former smoker w/ hx of CHF, A. fib on Eliquis, pacemaker, thoracic aortic aneurysm, OSA on CPAP, and gout on daily prednisone who presented to the [facility] ED with fever to 106, altered mental status, and hypotension.  Patient underwent a left total knee replacement on August 09, 2018 by Dr. Ulyses Amor in [place], [place].  Patient noticed redness swelling and pain along the incision site. Labs showed elevated white count at 13,000 and acute on chronic renal failure with serum creatinine at 1.6.   Subjective: Patient is resting comfortably in bed.  He denies chest pain shortness of breath nausea or vomiting.  He reports only mild pain in his left knee.  He feels that he is dramatically improved.  He is asking about when he can go home.  Assessment & Plan:  Septic Shock - Secondary to septic joint vs cellulitis  ID directing abx dosing - shock and sepsis resolved - Ortho has examined knee and does not feel this is septic prosthetic joint arthritis - plan at this time is to complete 6 weeks of van tx via a PICC line - pt will need to f/u w/ his "index surgeon" as an outpt in short course after his d/c   A Fib on Chronic Anticoagulation with Elquis and Tikosyn - QTc prolonged this morning - holding Tikosyn for now - will seek assistance from Cardiology   Chronic Systolic CHF TTE Sept 3710 EF 40-45% w/ diffuse hypokinesis (improved from 2017) - no marked volume overload on exam today  Filed Weights   08/21/18 0500 08/22/18 0500 08/23/18 0500  Weight: 102.1 kg 101.9 kg 101.9 kg    Acute on Chronic renal Failure related to septic shock and also likely RVR - follow trend   Recent Labs  Lab 08/20/18 1406 08/21/18 0454 08/22/18 0428 08/23/18 0407  CREATININE 1.73* 1.76* 1.76* 1.90*    OSA on nocturnal CPAP Continue CPAP  at bedtime - has his home device here in hospital   Hypomagnesium Corrected to goal of 2.0  Gout on daily prednisone 2.5mg  - no acute flair at this time   Left Total Knee Arthroplasty 07/2018 Per Dr. Ulyses Amor in [place] - will need short term f/u after d/c for joint re-evaluation   DVT prophylaxis: Eliquis  Code Status: FULL CODE Family Communication: spoke w/ wife at bedside at length   Disposition Plan: transfer to SDU   Consultants:  Ortho ID Cardiology   Antimicrobials:  Primaxin 11/23 > 11/23 Merropenem 11/23 > 11/24 Vanc 11/23 >   Objective: Blood pressure (!) 94/59, pulse (!) 41, temperature 98.8 F (37.1 C), temperature source Oral, resp. rate 20, height 5\' 9"  (1.753 m), weight 101.9 kg, SpO2 91 %.  Intake/Output Summary (Last 24 hours) at 08/23/2018 0939 Last data filed at 08/23/2018 0800 Gross per 24 hour  Intake 2314.98 ml  Output 1075 ml  Net 1239.98 ml   Filed Weights   08/21/18 0500 08/22/18 0500 08/23/18 0500  Weight: 102.1 kg 101.9 kg 101.9 kg    Examination: General: No acute respiratory distress Lungs: Clear to auscultation bilaterally without wheezes or crackles Cardiovascular: irreg irreg - rate uncontrolled - no M or rub  Abdomen: Nontender, nondistended, soft, bowel sounds positive, no rebound, no ascites, no appreciable mass Extremities: trace B LE edema - L knee wound w/o current erythema - no calor  of L knee/leg - no erythema   CBC: Recent Labs  Lab 08/20/18 1406 08/21/18 0454 08/22/18 0428 08/23/18 0407  WBC 38.0* 21.4* 13.3* 8.3  NEUTROABS 34.3*  --   --   --   HGB 9.8* 8.6* 7.8* 7.9*  HCT 31.3* 28.3* 25.7* 25.2*  MCV 98.1 99.0 98.5 97.3  PLT 417* 278 234 619   Basic Metabolic Panel: Recent Labs  Lab 08/21/18 0454 08/22/18 0428 08/23/18 0407  NA 132* 132* 133*  K 4.0 4.4 3.8  CL 103 103 102  CO2 21* 22 22  GLUCOSE 124* 123* 92  BUN 26* 31* 49*  CREATININE 1.76* 1.76* 1.90*  CALCIUM 8.2* 8.4* 8.3*  MG 2.1 1.9 2.1    GFR: Estimated Creatinine Clearance: 36.5 mL/min (A) (by C-G formula based on SCr of 1.9 mg/dL (H)).  Liver Function Tests: No results for input(s): AST, ALT, ALKPHOS, BILITOT, PROT, ALBUMIN in the last 168 hours. No results for input(s): LIPASE, AMYLASE in the last 168 hours. No results for input(s): AMMONIA in the last 168 hours.   Cardiac Enzymes: Recent Labs  Lab 08/22/18 0428  TROPONINI <0.03     Recent Results (from the past 240 hour(s))  MRSA PCR Screening     Status: None   Collection Time: 08/20/18 11:43 AM  Result Value Ref Range Status   MRSA by PCR NEGATIVE NEGATIVE Final    Comment:        The GeneXpert MRSA Assay (FDA approved for NASAL specimens only), is one component of a comprehensive MRSA colonization surveillance program. It is not intended to diagnose MRSA infection nor to guide or monitor treatment for MRSA infections. Performed at [facility], [place] [address] [place], [place] [address]   Culture, blood (routine x 2)     Status: None (Preliminary result)   Collection Time: 08/20/18  3:50 PM  Result Value Ref Range Status   Specimen Description BLOOD LEFT HAND  Final   Special Requests   Final    BOTTLES DRAWN AEROBIC ONLY Blood Culture adequate volume   Culture   Final    NO GROWTH 3 DAYS Performed at [facility], [place] [address], [place], [place] [address]    Report Status PENDING  Incomplete  Culture, blood (routine x 2)     Status: None (Preliminary result)   Collection Time: 08/20/18  3:57 PM  Result Value Ref Range Status   Specimen Description BLOOD LEFT HAND  Final   Special Requests   Final    BOTTLES DRAWN AEROBIC ONLY Blood Culture adequate volume   Culture   Final    NO GROWTH 3 DAYS Performed at [facility], [place] [address], [place], [place] [address]    Report Status PENDING  Incomplete     Scheduled Meds: . dofetilide  250 mcg Oral BID  . heparin injection (subcutaneous)  5,000 Units Subcutaneous  Q8H  . levothyroxine  112 mcg Oral Q0600  . magnesium oxide  400 mg Oral BID  . mouth rinse  15 mL Mouth Rinse BID  . sodium chloride flush  3 mL Intravenous Q12H   Continuous Infusions: . sodium chloride Stopped (08/22/18 8338)  . vancomycin 150 mL/hr at 08/23/18 0600     LOS: 3 days   Cherene Altes, MD [facility] Office  [phone] Pager - Text Page per Amion  If 7PM-7AM, please contact night-coverage per Amion 08/23/2018, 9:39 AM

## 2018-08-23 NOTE — Progress Notes (Signed)
eLink Physician-Brief Progress Note Patient Name: Don Jacobs DOB: 11-Dec-1935 MRN: [id number]   Date of Service  08/23/2018  HPI/Events of Note  Episode of AFIB with RVR. Ventricular rate = 130's. Now back in paced rhythm with rate = 67.  eICU Interventions  Will order: 1. BMP and Mg++ level STAT.     Intervention Category Major Interventions: Arrhythmia - evaluation and management  Sommer,Steven Eugene 08/23/2018, 3:59 AM

## 2018-08-23 NOTE — Consult Note (Addendum)
Cardiology Consultation:   Patient ID: Don Jacobs; [id number]; [birth year]   Admit date: 08/20/2018 Date of Consult: 08/23/2018  Primary Care Provider: [name], MD Primary Cardiologist: Larae Grooms, MD Primary Electrophysiologist:  Virl Axe, MD    Patient Profile:   Don Jacobs is a 82 y.o. male with a PMH of chronic systolic CHF, paroxsymal atrial fibrillation, SSS s/p PPM placement, thoracic aortic aneurysm followed by CT surgery, who is being seen today for the evaluation of atrial fibrillation and prolonged QTC at the request of Dr. Thereasa Solo.  History of Present Illness:   Don Jacobs recently underwent a left total knee replacement 08/09/18. Since that time he has noticed some redness, swelling, and pain along the incision site. He presented to [facility] ED with fever of 106, AMS, and hypotension. He was transferred to [facility] for sepsis, felt to be 2/2 septic joint vs cellulitis. Ortho evaluated patient this admission and did not feel this was septic prosthetic joint arthritis. Knee aspirate cultures have been negative, however patient was on clindamycin at the time of assessment. Decision was made to pursue a 6 weeks course of antibiotics. His hospital course has been complicated by atrial fibrillation with RVR with rates in the up to 160s this admission, generally in the 120s over the past 24 hours. Cardiology was asked to evaluate for this reason.   At the time of this evaluation he is feeling much improved since initial presentation. He states he normally feels increased fatigue and SOB when he goes into atrial fibrillation but didn't appreciate any significant change during episodes this admission. He denies any chest pain or SOB. No complaints of knee pain. He has some swelling in his legs L>R. Denies orthopnea or PND. He denies any issues with apixaban - no bleeding. He is eager to get home and hopeful to be discharged soon.  Past Medical History:  Diagnosis Date   . Allergic rhinitis   . Aortic aneurysm, thoracic ([place]) 05/25/2011  . BPH (benign prostatic hyperplasia)   . Chronic systolic CHF (congestive heart failure) ([place])   . Dizziness and giddiness 06/04/2015  . ED (erectile dysfunction)   . Gout   . Hearing loss    uses hearing a cyst  . Hx of migraines   . Hypertension   . Hypothyroidism   . Persistent atrial fibrillation   . Prostatitis, chronic   . Seborrheic keratosis   . Sick sinus syndrome ([facility])    a. s/p PPM   . Sleep apnea   . Spondylolisthesis 07/2010    Past Surgical History:  Procedure Laterality Date  . ANTERIOR CERVICAL DECOMP/DISCECTOMY FUSION  10/2000   "C4, 5, 6 w/fusion" (02/17/2013)  . BACK SURGERY    . CARDIAC CATHETERIZATION  2000's   "once" (02/17/2013)  . CARDIAC CATHETERIZATION N/A 07/23/2016   Procedure: Right/Left Heart Cath and Coronary Angiography;  Surgeon: Jettie Booze, MD;  Location: [facility] CV LAB;  Service: Cardiovascular;  Laterality: N/A;  . CARDIOVERSION N/A 04/09/2014   Procedure: CARDIOVERSION;  Surgeon: Lelon Perla, MD;  Location: [facility] ENDOSCOPY;  Service: Cardiovascular;  Laterality: N/A;  . CARDIOVERSION N/A 01/17/2015   Procedure: CARDIOVERSION;  Surgeon: Larey Dresser, MD;  Location: [facility] ENDOSCOPY;  Service: Cardiovascular;  Laterality: N/A;  . FOOT SURGERY Right ~ 2007   "reconstruction" (02/17/2013)  . INSERT / REPLACE / REMOVE PACEMAKER  02/17/2013  . KNEE ARTHROSCOPY Left ~ 2007  . L5 selective nerve root block     Dr Nelva Bush  .  LUMBAR SPINE SURGERY  2012   "bone graft, Dr. [name]" (02/17/2013)  . PERMANENT PACEMAKER INSERTION N/A 02/17/2013   Procedure: PERMANENT PACEMAKER INSERTION;  Surgeon: Deboraha Sprang, MD;  Location: [facility] CATH LAB;  Service: Cardiovascular;  Laterality: N/A;  . REPAIR PERONEAL TENDONS ANKLE Right 10/2003  . SHOULDER OPEN ROTATOR CUFF REPAIR Right 09/2007; 2013     Home Medications:  Prior to Admission medications   Medication Sig Start Date End Date  Taking? Authorizing Provider  acetaminophen (TYLENOL) 500 MG tablet Take 500 mg by mouth every 6 (six) hours as needed (for fever or pain).    Yes [provider]  allopurinol (ZYLOPRIM) 300 MG tablet Take 300 mg by mouth at bedtime.    Yes [provider]  apixaban (ELIQUIS) 5 MG TABS tablet Take 1 tablet (5 mg total) by mouth 2 (two) times daily. 07/05/17  Yes Sherran Needs, NP  benzonatate (TESSALON) 200 MG capsule Take 1 capsule (200 mg total) by mouth 3 (three) times daily as needed for cough. 09/27/17  Yes Young, Tarri Fuller D, MD  Calcium Carbonate (CALTRATE 600 PO) Take 600 mg by mouth at bedtime.    Yes [provider]  carvedilol (COREG) 3.125 MG tablet TAKE 1 TABLET BY MOUTH TWICE DAILY Patient taking differently: Take 3.125 mg by mouth 2 (two) times daily with a meal.  08/05/16  Yes Deboraha Sprang, MD  Cholecalciferol (VITAMIN D3) 50 MCG (2000 UT) TABS Take 2,000 Units by mouth daily.   Yes [provider]  Cinnamon 500 MG capsule Take 500 mg by mouth every evening.    Yes [provider]  clindamycin (CLEOCIN) 300 MG capsule Take 300 mg by mouth 4 (four) times daily. FOR 10 DAYS 08/15/18 08/24/18 Yes [provider]  colchicine 0.6 MG tablet Take 0.6 mg by mouth daily as needed (for gout flares).    Yes [provider]  cyanocobalamin 500 MCG tablet Take 500 mcg by mouth every evening.    Yes [provider]  dofetilide (TIKOSYN) 250 MCG capsule Take 1 capsule (250 mcg total) by mouth 2 (two) times daily. 01/19/18  Yes Sherran Needs, NP  furosemide (LASIX) 40 MG tablet TAKE 1 TABLET BY MOUTH DAILY. Patient taking differently: Take 40 mg by mouth daily.  07/21/16  Yes Allred, Jeneen Rinks, MD  guaiFENesin (ROBITUSSIN) 100 MG/5ML SOLN Take 10 mLs by mouth every 4 (four) hours as needed for cough or to loosen phlegm.   Yes [provider]  levothyroxine (SYNTHROID, LEVOTHROID) 112 MCG tablet Take 1 tablet (112 mcg  total) by mouth daily before breakfast. 01/08/15  Yes Eileen Stanford, PA-C  lisinopril (PRINIVIL,ZESTRIL) 5 MG tablet Take 0.5 tablets (2.5 mg total) by mouth daily. 06/19/16  Yes Sherran Needs, NP  loperamide (IMODIUM A-D) 2 MG tablet Take 2 mg by mouth 4 (four) times daily as needed for diarrhea or loose stools.   Yes [provider]  loratadine (CLARITIN) 10 MG tablet Take 5 mg by mouth 2 (two) times daily as needed for allergies or rhinitis.    Yes [provider]  Magnesium 400 MG CAPS Take 400 mg by mouth 2 (two) times daily. Patient taking differently: Take 400 mg by mouth daily.  02/08/15  Yes Sherran Needs, NP  Misc Natural Products (SAW PALMETTO) CAPS Take 1 capsule by mouth daily.   Yes [provider]  NON FORMULARY Take 1 capsule by mouth See admin instructions. N4- Greens vegetable capsule:  Take 1 capsule by mouth once a day   Yes [provider]  OVER THE COUNTER MEDICATION Take 1 capsule by mouth See admin instructions. [place] Grape Seed Extract: Take 1 capsule by mouth in the evening   Yes [provider]  polyvinyl alcohol (LIQUIFILM TEARS) 1.4 % ophthalmic solution Place 1 drop into both eyes 3 (three) times daily as needed for dry eyes.   Yes [provider]  potassium chloride SA (K-DUR,KLOR-CON) 20 MEQ tablet Take 20 mEq by mouth 3 (three) times daily.    Yes [provider]  predniSONE (DELTASONE) 10 MG tablet Take 5-10 mg by mouth daily as needed (for gout flares).    Yes [provider]  traMADol (ULTRAM) 50 MG tablet Take 50 mg by mouth 2 (two) times daily as needed (for pain).   Yes [provider]  vitamin C (ASCORBIC ACID) 500 MG tablet Take 500 mg by mouth daily.    Yes [provider]    Inpatient Medications: Scheduled Meds: . allopurinol  300 mg Oral QHS  . apixaban  5 mg Oral BID  . carvedilol  3.125 mg Oral BID  . dofetilide  250 mcg Oral BID  . levothyroxine   112 mcg Oral Q0600  . magnesium oxide  400 mg Oral BID  . mouth rinse  15 mL Mouth Rinse BID  . sodium chloride flush  3 mL Intravenous Q12H   Continuous Infusions: . sodium chloride Stopped (08/22/18 8366)  . DAPTOmycin (CUBICIN)  IV     PRN Meds: sodium chloride, acetaminophen, sodium chloride flush  Allergies:    Allergies  Allergen Reactions  . Penicillins Hives, Shortness Of Breath and Rash    Ended up at ER after using Has patient had a PCN reaction causing immediate rash, facial/tongue/throat swelling, SOB or lightheadedness with hypotension: Yes Has patient had a PCN reaction causing severe rash involving mucus membranes or skin necrosis: No Has patient had a PCN reaction that required hospitalization: No Has patient had a PCN reaction occurring within the last 10 years: No If all of the above answers are "NO", then may proceed with Cephalosporin use.   Marland Kitchen Oxycodone Other (See Comments)    Altered mental status  . Ibuprofen Other (See Comments)    Is on Eliquis; not suppose to have this now    Social History:   Social History   Socioeconomic History  . Marital status: Married    Spouse name: Not on file  . Number of children: 2  . Years of education: 78  . Highest education level: Not on file  Occupational History  . Occupation: retired    Fish farm manager: RETIRED  Social Needs  . Financial resource strain: Not on file  . Food insecurity:    Worry: Not on file    Inability: Not on file  . Transportation needs:    Medical: Not on file    Non-medical: Not on file  Tobacco Use  . Smoking status: Former Smoker    Packs/day: 1.00    Years: 12.00    Pack years: 12.00    Types: Cigarettes    Last attempt to quit: 03/11/1981    Years since quitting: 37.4  . Smokeless tobacco: Never Used  Substance and Sexual Activity  . Alcohol use: No  . Drug use: No  . Sexual activity: Not on file  Lifestyle  . Physical activity:    Days per week: Not on file    Minutes per  session: Not on file  . Stress: Not on file  Relationships  . Social connections:    Talks on phone: Not on file    Gets together: Not on file    Attends religious service: Not on file    Active member of club or organization: Not on file    Attends meetings of clubs or organizations: Not on file    Relationship status: Not on file  . Intimate partner violence:    Fear of current or ex partner: Not on file    Emotionally abused: Not on file    Physically abused: Not on file    Forced sexual activity: Not on file  Other Topics Concern  . Not on file  Social History Narrative   Patient does not drink caffeine.   Patient is right handed.    Family History:    Family History  Problem Relation Age of Onset  . Heart disease Father   . Heart attack Father   . Emphysema Mother   . Alcohol abuse Maternal Grandmother   . Alcohol abuse Maternal Grandfather   . Heart attack Paternal Grandfather   . Heart disease Paternal Grandfather   . Rheum arthritis Brother      ROS:  Please see the history of present illness.   All other ROS reviewed and negative.     Physical Exam/Data:   Vitals:   08/23/18 1142 08/23/18 1200 08/23/18 1300 08/23/18 1400  BP:  95/63 (!) 86/55 (!) 96/58  Pulse:  60 (!) 117 63  Resp:  (!) 29 (!) 22 (!) 23  Temp: 99.3 F (37.4 C)     TempSrc: Oral     SpO2:  99% 95% 95%  Weight:      Height:        Intake/Output Summary (Last 24 hours) at 08/23/2018 1441 Last data filed at 08/23/2018 1300 Gross per 24 hour  Intake 2304.98 ml  Output 1325 ml  Net 979.98 ml   Filed Weights   08/21/18 0500 08/22/18 0500 08/23/18 0500  Weight: 102.1 kg 101.9 kg 101.9 kg   Body mass index is 33.17 kg/m.  General:  Well nourished, well developed, laying in bed in no acute distress HEENT: sclera anicteric  Neck: no JVD; catheter in right IJ Vascular: distal pulses 2+ bilaterally Cardiac:  normal S1, S2; RRR; no murmurs, rubs, or gallops Lungs:  clear to  auscultation bilaterally, no wheezing, rhonchi or rales  Abd: NABS, soft, nontender, no hepatomegaly Ext: trace-1+ RLE edema, 2+ LLE; L knee incision site C/D/I without erythema  Musculoskeletal:  No deformities, BUE and BLE strength normal and equal Skin: warm and dry  Neuro:  CNs 2-12 intact, no focal abnormalities noted Psych:  Normal affect   EKG:  The EKG was personally reviewed and demonstrates:  Atrial fibrillation with RVR, rate 125; QTC 458, chronic LBBB Telemetry:  Telemetry was personally reviewed and demonstrates:  Atrial fibrillation with RVR with rates up to the 120s over the past 24 hours, with conversion to paced rhythm with rates in the 60s shortly before this evaluation.   Relevant CV Studies: Echocardiogram 05/2018: Study Conclusions  - Left ventricle: There was mild concentric hypertrophy. Systolic   function was mildly to moderately reduced. The estimated ejection   fraction was in the range of 40% to 45%. Diffuse hypokinesis.   Doppler parameters are consistent with abnormal left ventricular   relaxation (grade 1 diastolic dysfunction). - Ventricular septum: Septal motion showed paradox. These changes  are consistent with right ventricular pacing. - Left atrium: The atrium was mildly dilated.  Impressions:  - Left ventricular systolic function has improved since 06/18/2016.  ------------------------------------------------------------------- Labs, prior tests, procedures, and surgery: Transthoracic echocardiography (06/18/2016).     EF was 25%.  Permanent pacemaker system implantation.   Laboratory Data:  Chemistry Recent Labs  Lab 08/21/18 0454 08/22/18 0428 08/23/18 0407  NA 132* 132* 133*  K 4.0 4.4 3.8  CL 103 103 102  CO2 21* 22 22  GLUCOSE 124* 123* 92  BUN 26* 31* 49*  CREATININE 1.76* 1.76* 1.90*  CALCIUM 8.2* 8.4* 8.3*  GFRNONAA 35* 35* 32*  GFRAA 40* 40* 37*  ANIONGAP 8 7 9     No results for input(s): PROT, ALBUMIN, AST,  ALT, ALKPHOS, BILITOT in the last 168 hours. Hematology Recent Labs  Lab 08/21/18 0454 08/22/18 0428 08/23/18 0407  WBC 21.4* 13.3* 8.3  RBC 2.86* 2.61* 2.59*  HGB 8.6* 7.8* 7.9*  HCT 28.3* 25.7* 25.2*  MCV 99.0 98.5 97.3  MCH 30.1 29.9 30.5  MCHC 30.4 30.4 31.3  RDW 15.1 14.7 14.7  PLT 278 234 267   Cardiac Enzymes Recent Labs  Lab 08/22/18 0428  TROPONINI <0.03   No results for input(s): TROPIPOC in the last 168 hours.  BNPNo results for input(s): BNP, PROBNP in the last 168 hours.  DDimer No results for input(s): DDIMER in the last 168 hours.  Radiology/Studies:  Dg Chest Port 1 View  Result Date: 08/20/2018 CLINICAL DATA:  Central chest soreness. Fall on the 14. Shortness of breath. EXAM: PORTABLE CHEST 1 VIEW COMPARISON:  08/20/2018 FINDINGS: Cardiac pacemaker. Right central venous catheter with tip over the low SVC region. No pneumothorax. Cardiac enlargement. Shallow inspiration with bilateral basilar atelectasis. Possible bilateral pleural effusions. Degenerative changes in the spine and shoulders. Postoperative change in the cervical spine. IMPRESSION: Cardiac enlargement. Shallow inspiration with bilateral basilar atelectasis and probable pleural effusions. Electronically Signed   By: Lucienne Capers M.D.   On: 08/20/2018 21:28   Dg Knee Left Port  Result Date: 08/20/2018 CLINICAL DATA:  Sepsis EXAM: PORTABLE LEFT KNEE - 1-2 VIEW COMPARISON:  Radiographs from 08/09/2018 FINDINGS: Right total knee prosthesis observed. No appreciable fracture or abnormal lucency along the prosthesis. Skin staples are in place anteriorly. There is some chronic fragmentation of a superior spur. The gas is in the soft tissues shown on the postoperative radiograph have resolved. There does appear to be a likely effusion in the suprapatellar bursa, with indistinctness of local fat planes. IMPRESSION: 1. Suspected effusion in the suprapatellar bursa. 2. Right total knee prosthesis in place  without complicating feature. Electronically Signed   By: Van Clines M.D.   On: 08/20/2018 16:34   Vas [place] Lower Extremity Venous (dvt)  Result Date: 08/22/2018  Lower Venous Study Indications: Edema, and Cellulitis, sepsis, recent left TKR.  Limitations: Pain with compression of left leg, popliteal to ankle. Comparison Study: No prior study on file for comparison Performing Technologist: Sharion Dove RVS  Examination Guidelines: A complete evaluation includes B-mode imaging, spectral Doppler, color Doppler, and power Doppler as needed of all accessible portions of each vessel. Bilateral testing is considered an integral part of a complete examination. Limited examinations for reoccurring indications may be performed as noted.  Right Venous Findings: +---------+---------------+---------+-----------+----------+-------+          CompressibilityPhasicitySpontaneityPropertiesSummary +---------+---------------+---------+-----------+----------+-------+ CFV      Full           Yes  Yes                          +---------+---------------+---------+-----------+----------+-------+ SFJ      Full                                                 +---------+---------------+---------+-----------+----------+-------+ FV Prox  Full                                                 +---------+---------------+---------+-----------+----------+-------+ FV Mid   Full                                                 +---------+---------------+---------+-----------+----------+-------+ FV DistalFull                                                 +---------+---------------+---------+-----------+----------+-------+ PFV      Full                                                 +---------+---------------+---------+-----------+----------+-------+ POP      Full           Yes      Yes                          +---------+---------------+---------+-----------+----------+-------+  PTV      Full                                                 +---------+---------------+---------+-----------+----------+-------+ PERO     Full                                                 +---------+---------------+---------+-----------+----------+-------+ GSV      Full                                                 +---------+---------------+---------+-----------+----------+-------+  Left Venous Findings: +---------+---------------+---------+-----------+----------+------------------+          CompressibilityPhasicitySpontaneityPropertiesSummary            +---------+---------------+---------+-----------+----------+------------------+ CFV      Full           Yes      Yes                                     +---------+---------------+---------+-----------+----------+------------------+  SFJ      Full                                                            +---------+---------------+---------+-----------+----------+------------------+ FV Prox  Full                                                            +---------+---------------+---------+-----------+----------+------------------+ FV Mid   Full                                                            +---------+---------------+---------+-----------+----------+------------------+ FV DistalFull                                                            +---------+---------------+---------+-----------+----------+------------------+ PFV      Full                                                            +---------+---------------+---------+-----------+----------+------------------+ POP                     Yes      Yes                  patent by color                                                          Doppler            +---------+---------------+---------+-----------+----------+------------------+ PTV                                                   patent by  color                                                          Doppler            +---------+---------------+---------+-----------+----------+------------------+ PERO  patent by color                                                          Doppler            +---------+---------------+---------+-----------+----------+------------------+ GSV      Full                                                            +---------+---------------+---------+-----------+----------+------------------+    Summary: Right: There is no evidence of deep vein thrombosis in the lower extremity.There is no evidence of superficial venous thrombosis. Left: There is no evidence of superficial venous thrombosis.There is no evidence of deep vein thrombosis in the lower extremity. However, portions of this examination were limited- see technologist comments above.  *See table(s) above for measurements and observations. Electronically signed by Deitra Mayo MD on 08/22/2018 at 4:21:38 PM.    Final    [place] Ekg Site Rite  Result Date: 08/23/2018 If Site Rite image not attached, placement could not be confirmed due to current cardiac rhythm.   Assessment and Plan:   1. Atrial fibrillation with RVR: patient has a history of paroxsymal atrial fibrillation managed with tikosyn for rhythm control and apixaban for stroke ppx. He was admitted with sepsis 2/2 septic joint vs cellulitis. He has been in afib with RVR this admission, though rate improved to the 60s at the time of this evaluation. His home tikosyn continued with close monitoring of QTc. Suspect RVR being driving by current infection.  - Continue home tikosyn for rhythm control - Continue carvedilol 3.125mg  BID for rate control  - Continue apixaban 5mg  BID for CHA2DS2-VASc Score of 4 (CHF, HTN, and Age >75)  2. Chronic systolic CHF: EF 36-62% on last echo 05/2018, improved from 25% in 2017. No  complaints of SOB and lungs are clear on exam. He does have some LE edema L>R. Home lasix has been on hold given hypotension and elevated Cr.  - Continue to monitor volume status closely with management of sepsis - Anticipate he will need to resume lasix at discharge if Cr improves  3. HTN: BP still on the soft side. Home lisinopril on hold for hypotension/elevated Cr. Home carvedilol has been continued, though administration has been limited by hypotension - Continue carvedilol as tolerated by BP - Resume home lisinopril if Cr and BP improve  4. SSS s/p PPM: currently with paced rhythm with rates in the 60s.  - Continue routine outpatient follow-up with Dr. Caryl Comes  5. Sepsis: patient presented with fevers, AMS, and hypotension c/f sepsis. L knee was erythematous and warm to touch. S/p L knee replacement 08/09/18 - suspicions high for prosthetic joint infection. He is on IV antibiotics with recommendations to complete a 6 weeks course.  - Continue management per primary team.   For questions or updates, please contact [place] Please consult [email or web address] for contact info under Cardiology/STEMI.   Signed, Abigail Butts, PA-C  08/23/2018 2:41 PM [phone]  I have examined the patient and reviewed assessment and plan and discussed with patient.  Agree with above as stated.  On apixaban for stroke  prevention.  AFib with RVR has resolved.  Back in a paced rhythm.  Add back beta blocker as BP allows.  Currently BP is borderline.  Also, he has had a lot of volume; may need Lasix if BP will allow.  Longterm antibiotics for infection.   Larae Grooms

## 2018-08-23 NOTE — Progress Notes (Addendum)
Pharmacy Antibiotic Note  Don Jacobs is a 82 y.o. male admitted on 08/20/2018 with suspected prosthetic knee infection s/p recent knee replacement.  Pharmacy has been consulted for Daptomycin dosing by ID Physician Service.  On admission PCT 29, LA 1.5, Wbc 38>21>13.3>8.3; Scr 1.76>1.90, afebrile   Plan: Stop Vancomycin Daptomycin 670mg  IV every 24 hours (using 8mg /kg and Adjusted Body Weight) CK lab tomorrow morning, then weekly. Follow-up clinical improvement, LOT, CK, renal function, cultures and susceptibilities, CBC.   Height: 5\' 9"  (175.3 cm) Weight: 224 lb 10.4 oz (101.9 kg) IBW/kg (Calculated) : 70.7  Temp (24hrs), Avg:98.8 F (37.1 C), Min:98.2 F (36.8 C), Max:99.7 F (37.6 C)  Recent Labs  Lab 08/20/18 1406 08/20/18 1550 08/21/18 0454 08/22/18 0428 08/23/18 0407  WBC 38.0*  --  21.4* 13.3* 8.3  CREATININE 1.73*  --  1.76* 1.76* 1.90*  LATICACIDVEN 1.2 1.5  --   --   --     Estimated Creatinine Clearance: 36.5 mL/min (A) (by C-G formula based on SCr of 1.9 mg/dL (H)).    Allergies  Allergen Reactions  . Penicillins Hives, Shortness Of Breath and Rash    Ended up at ER after using Has patient had a PCN reaction causing immediate rash, facial/tongue/throat swelling, SOB or lightheadedness with hypotension: Yes Has patient had a PCN reaction causing severe rash involving mucus membranes or skin necrosis: No Has patient had a PCN reaction that required hospitalization: No Has patient had a PCN reaction occurring within the last 10 years: No If all of the above answers are "NO", then may proceed with Cephalosporin use.   Marland Kitchen Oxycodone Other (See Comments)    Altered mental status  . Ibuprofen Other (See Comments)    Is on Eliquis; not suppose to have this now    Antimicrobials this admission: 11/23 meropenem > 11/25 11/23 vancomycin > 11/26 11/26 Daptomycin >>   Dose adjustments this admission: n/a  Microbiology results: Oval Linsey knee aspirate: NGTD   11/23 blood cx: NG X 48 hours  11/23 mrsa pcr: Negative    Thank you for allowing pharmacy to be a part of this patient's care.  Tamela Gammon, PharmD 08/23/2018 10:06 AM PGY-1 Pharmacy Resident Direct Phone: [phone] Please check [email or web address] for unit-specific pharmacist phone numbers

## 2018-08-23 NOTE — Progress Notes (Signed)
Patient has home CPAP unit. States he does not need any assistance from RT.

## 2018-08-24 DIAGNOSIS — E8779 Other fluid overload: Secondary | ICD-10-CM

## 2018-08-24 DIAGNOSIS — I4821 Permanent atrial fibrillation: Secondary | ICD-10-CM

## 2018-08-24 LAB — MAGNESIUM: Magnesium: 2.1 mg/dL (ref 1.7–2.4)

## 2018-08-24 LAB — COMPREHENSIVE METABOLIC PANEL
ALT: 75 U/L — AB (ref 0–44)
AST: 107 U/L — AB (ref 15–41)
Albumin: 2.2 g/dL — ABNORMAL LOW (ref 3.5–5.0)
Alkaline Phosphatase: 76 U/L (ref 38–126)
Anion gap: 8 (ref 5–15)
BUN: 48 mg/dL — AB (ref 8–23)
CALCIUM: 8.4 mg/dL — AB (ref 8.9–10.3)
CO2: 23 mmol/L (ref 22–32)
CREATININE: 1.81 mg/dL — AB (ref 0.61–1.24)
Chloride: 104 mmol/L (ref 98–111)
GFR calc non Af Amer: 35 mL/min — ABNORMAL LOW (ref >60)
GFR, EST AFRICAN AMERICAN: 40 mL/min — AB (ref >60)
Glucose, Bld: 93 mg/dL (ref 70–99)
Potassium: 4.1 mmol/L (ref 3.5–5.1)
SODIUM: 135 mmol/L (ref 135–145)
Total Bilirubin: 1.3 mg/dL — ABNORMAL HIGH (ref 0.3–1.2)
Total Protein: 5.1 g/dL — ABNORMAL LOW (ref 6.5–8.1)

## 2018-08-24 LAB — CBC
HCT: 25.6 % — ABNORMAL LOW (ref 39.0–52.0)
HEMOGLOBIN: 7.8 g/dL — AB (ref 13.0–17.0)
MCH: 29.7 pg (ref 26.0–34.0)
MCHC: 30.5 g/dL (ref 30.0–36.0)
MCV: 97.3 fL (ref 80.0–100.0)
Platelets: 291 10*3/uL (ref 150–400)
RBC: 2.63 MIL/uL — AB (ref 4.22–5.81)
RDW: 14.8 % (ref 11.5–15.5)
WBC: 5.6 10*3/uL (ref 4.0–10.5)
nRBC: 0 % (ref 0.0–0.2)

## 2018-08-24 LAB — CK: CK TOTAL: 28 U/L — AB (ref 49–397)

## 2018-08-24 LAB — PHOSPHORUS: PHOSPHORUS: 3.2 mg/dL (ref 2.5–4.6)

## 2018-08-24 MED ORDER — FUROSEMIDE 10 MG/ML IJ SOLN
80.0000 mg | Freq: Once | INTRAMUSCULAR | Status: AC
  Administered 2018-08-24: 80 mg via INTRAVENOUS

## 2018-08-24 MED ORDER — APIXABAN 2.5 MG PO TABS
2.5000 mg | ORAL_TABLET | Freq: Two times a day (BID) | ORAL | Status: DC
  Administered 2018-08-24 – 2018-08-25 (×2): 2.5 mg via ORAL
  Filled 2018-08-24 (×2): qty 1

## 2018-08-24 MED ORDER — FUROSEMIDE 10 MG/ML IJ SOLN
INTRAMUSCULAR | Status: AC
  Administered 2018-08-24: 80 mg via INTRAVENOUS
  Filled 2018-08-24: qty 8

## 2018-08-24 MED ORDER — SODIUM CHLORIDE 0.9 % IV SOLN
670.0000 mg | Freq: Every day | INTRAVENOUS | Status: DC
  Administered 2018-08-24 – 2018-08-25 (×2): 670 mg via INTRAVENOUS
  Filled 2018-08-24 (×2): qty 13.4

## 2018-08-24 MED ORDER — DAPTOMYCIN IV (FOR PTA / DISCHARGE USE ONLY): 670.0000 mg | INTRAVENOUS | 0 refills | Status: AC

## 2018-08-24 MED ORDER — FUROSEMIDE 10 MG/ML IJ SOLN: 40.0000 mg | Freq: Once | INTRAMUSCULAR | Status: DC

## 2018-08-24 NOTE — Progress Notes (Signed)
Spoke with RN Angela Nevin and she is aware of the DC central line order, RN states Central line was removed yesterday order just wasn't complete

## 2018-08-24 NOTE — Progress Notes (Signed)
PHARMACY CONSULT NOTE FOR:  OUTPATIENT  PARENTERAL ANTIBIOTIC THERAPY (OPAT)  Indication: Prosthetic joint infection  Regimen: Daptomycin 670 mg daily End date: 10/01/18  IV antibiotic discharge orders are pended. To discharging provider:  please sign these orders via discharge navigator,  Select New Orders & click on the button choice - Manage This Unsigned Work.     Thank you for allowing pharmacy to be a part of this patient's care.  Susa Raring, PharmD, BCPS Infectious Diseases Clinical Pharmacist Phone: [phone] 08/24/2018, 8:06 AM

## 2018-08-24 NOTE — Care Management Note (Addendum)
Case Management Note  Patient Details  Name: Don Jacobs MRN: [id number] Date of Birth: 1936/05/10  Subjective/Objective:     Pt admitted with septic shock secondary to septic joint and cellulitis               Action/Plan:  PTA independent from home with wife.  [place] consulted by ID group for home IV antibiotics - [facility] informed CM that pt does not currently have active prescription coverage via medicare nor BCBS.  Pt informed that he is active with the [facility] - pt informed CM that he gets his prescriptions filled at the [facility] clinic.  CM left VM for transfer coordinator, [facility] SW Christie Beckers and [facility].  CM also contacted the [facility] clinic to inform of admit.  CM also contacted [place] and left VM. CM provided attending [place] pharmacy fax number [phone]    Expected Discharge Date:                  Expected Discharge Plan:  Home/Self Care  In-House Referral:     Discharge planning Services  CM Consult  Post Acute Care Choice:    Choice offered to:     DME Arranged:    DME Agency:     HH Arranged:    [place] Agency:     Status of Service:  In process, will continue to follow  If discussed at Long Length of Stay Meetings, dates discussed:    Additional Comments: 08/24/2018  CM confirmed with [facility] that all required documentation has been received for IV infusin.  CM text paged Jola Baptist twice and left VM requesting call back to gain verification that all required documentation has been received by the [facility] and that [facility] is accepted  CM communicated with unit AD that pt is to received tomorrows IV antibiotics dose prior to discharge - Jackquline Denmark is set to begin pt care Friday and will administer first home dose at that time.  CM informed that pt is actually active with [facility] for PT only however pt/wife would like to switch to [facility] at this time - wife to contact agency and inform them that services are  no longer needed  Attending signed prescription.  CM faxed all requested information to all 3 numbers.  Both [facility] and [facility] following pt.  Both pt and wife in agreement with discharge plan.    CM again requested attending sign IV antibiotics for prescription/HH orders/DME orders  CM faxed CK results and EKG confirmation of PICC line strip to [facility] as requested.  [facility] has accepted [facility] referral - CM left [facility] SW voicemail.    CM was able to speak with Jola Baptist [phone].  CM informed SW;  Need of IV Daptomycin until 10/01/18, pt needs HHRN and PT, [facility] has accepted tentative IV infusion order (pending [facility] auth) and [facility] will also arrange [place] with [place] approved agency- pt in agreement for any agency that accepts [place].  [facility] SW is in agreement with [facility] assistance with locating accepted [place] agency (no preferences communicated by SW).  SW acknowledged pts admit and HH/DME need - SW to reach out to pts PCP.  SW requested CM fax the following documents to both pts PCP Dr Arletha Grippe at [phone] and to [facility] clinic in [place] at [phone] attention Dr Mosie Lukes; H&P, PICC note, IV antibiotic order, I&D note, labs and pharmacy notes.  DME needs and HH needs communicated to [place] SW -  recommendations noted however [place] written orders are not required by [facility].  CM requested attending to sign IV antibiotic prescription.    CM again attempted to reach [place] resources, CM left VM for [place], [place] and [place] with [place].  [place] contacted [facility] Vaughan [place] [phone]) that is normally in network with [facility] to determine if arrangements for IV antibiotics/HH  could be facilitated through agency. Maryclare Labrador, RN 08/24/2018, 8:43 AM

## 2018-08-24 NOTE — Evaluation (Addendum)
Occupational Therapy Evaluation Patient Details Name: Don Jacobs MRN: [id number] DOB: 06/01/1936 Today's Date: 08/24/2018    History of Present Illness 82 y.o. male admitted on 08/20/18 for fever, AMS, and hypotension.  Pt with recent history of L TKA on 08/09/18.  Dx with septic shock (joint infection vs cellulitis), a-fib with prolonged QTC (cardiology consulted).  Pt with other significant PMH of R TKA, spondylolisthesis s/p lumbar spine surgery, SSS, PAF, HTN, HOH, gout, chornic systolic CHF, AA-thoracic, R shoulder RTC repair, R ankle peroneal tendon repair, and ACDF.      Clinical Impression   This 83 yo male admitted with above presents to acute OT with PLOF of being able to do all basic ADLs except getting  LB clothing on and off since left knee surgery and now still with difficulty with this, but also with ambulation/balance/increased stiffness in LLE. He will benefit from acute OT without need for follow up .  HR: 75 upon checking monitor before entering room         110's-130's when entered room with pt sitting EOB          Up to 160's with pt standing at EOB for urinal RN aware     Follow Up Recommendations  No OT follow up;Supervision/Assistance - 24 hour    Equipment Recommendations  None recommended by OT       Precautions / Restrictions Precautions Precaution Comments: Monitor HR Restrictions Weight Bearing Restrictions: No LLE Weight Bearing: Weight bearing as tolerated(per pt report)      Mobility Bed Mobility Overal bed mobility: Needs Assistance Bed Mobility: Sit to Supine       Sit to supine: Min assist   General bed mobility comments: for LLE  Transfers Overall transfer level: Needs assistance Equipment used: Rolling walker (2 wheeled) Transfers: Sit to/from Stand Sit to Stand: Min guard;From elevated surface              Balance Overall balance assessment: Needs assistance Sitting-balance support: No upper extremity supported;Feet  supported       Standing balance support: Bilateral upper extremity supported Standing balance-Leahy Scale: Poor Standing balance comment: needs external support from RW and therapist.                            ADL either performed or assessed with clinical judgement   ADL Overall ADL's : Needs assistance/impaired Eating/Feeding: Independent;Sitting   Grooming: Set up;Sitting   Upper Body Bathing: Set up;Sitting   Lower Body Bathing: Minimal assistance Lower Body Bathing Details (indicate cue type and reason): min guard A sit<>stand elevated surface Upper Body Dressing : Set up;Sitting   Lower Body Dressing: Moderate assistance Lower Body Dressing Details (indicate cue type and reason): min guard A sit<>stand elevated surface; educated on trying to take socks off and put back on each day as well as sequence of dressing (they were already aware of this) Toilet Transfer: Minimal assistance;RW Toilet Transfer Details (indicate cue type and reason): min guard A sit<>stand elevated surface Toileting- Clothing Manipulation and Hygiene: Min guard Toileting - Clothing Manipulation Details (indicate cue type and reason): min guard A sit<>stand elevated surface             Vision Baseline Vision/History: Wears glasses Wears Glasses: At all times Patient Visual Report: No change from baseline              Pertinent Vitals/Pain Pain Assessment: Faces Faces Pain Scale: Hurts a  little bit Pain Location: L knee  Pain Descriptors / Indicators: (stiff) Pain Intervention(s): Limited activity within patient's tolerance;Monitored during session;Repositioned     Hand Dominance Right   Extremity/Trunk Assessment Upper Extremity Assessment Upper Extremity Assessment: Overall WFL for tasks assessed           Communication Communication Communication: HOH   Cognition Arousal/Alertness: Awake/alert Behavior During Therapy: WFL for tasks assessed/performed Overall  Cognitive Status: Within Functional Limits for tasks assessed                                                Home Living Family/patient expects to be discharged to:: Private residence Living Arrangements: Spouse/significant other Available Help at Discharge: Family;Available 24 hours/day Type of Home: House Home Access: Stairs to enter [employer] of Steps: 7 Entrance Stairs-Rails: Right;Left;Can reach both Home Layout: One level     Bathroom Shower/Tub: Occupational psychologist: Handicapped height     Home Equipment: Radiation protection practitioner Equipment: Reacher;Sock aid;Long-handled shoe horn;Long-handled sponge        Prior Functioning/Environment Level of Independence: Needs assistance  Gait / Transfers Assistance Needed: supervision for gait with RW; A into bed ADL's / Homemaking Assistance Needed: A with LBD post left knee sx            OT Problem List: Decreased range of motion;Impaired balance (sitting and/or standing);Pain      OT Treatment/Interventions: Self-care/ADL training;Balance training;DME and/or AE instruction;Patient/family education    OT Goals(Current goals can be found in the care plan section) Acute Rehab OT Goals Patient Stated Goal: get back to being active OT Goal Formulation: With patient/family Time For Goal Achievement: 09/07/18 Potential to Achieve Goals: Good  OT Frequency: Min 2X/week              AM-PAC OT "6 Clicks" Daily Activity     Outcome Measure Help from another person eating meals?: None Help from another person taking care of personal grooming?: A Little Help from another person toileting, which includes using toliet, bedpan, or urinal?: A Little Help from another person bathing (including washing, rinsing, drying)?: A Little Help from another person to put on and taking off regular upper body clothing?: A Little Help from another person to put on and taking off regular lower  body clothing?: A Lot 6 Click Score: 18   End of Session Equipment Utilized During Treatment: Rolling walker Nurse Communication: (HR up to 160s even at rest)  Activity Tolerance: Other (comment)(treatment limited due to elevated HR with no/minimal activity) Patient left: in bed;with call bell/phone within reach;with family/visitor present  OT Visit Diagnosis: Unsteadiness on feet (R26.81);Other abnormalities of gait and mobility (R26.89);Muscle weakness (generalized) (M62.81);Pain Pain - Right/Left: Left Pain - part of body: Knee                Time: 8250-5397 OT Time Calculation (min): 22 min Charges:  OT General Charges $OT Visit: 1 Visit OT Evaluation $OT Eval Moderate Complexity: 1 Mod  Golden Circle, OTR/L Acute [employer] Pager [phone] Office [phone]     Almon Register 08/24/2018, 9:52 AM

## 2018-08-24 NOTE — Progress Notes (Signed)
Physical Therapy Treatment Patient Details Name: Don Jacobs MRN: [id number] DOB: 1936/04/24 Today's Date: 08/24/2018    History of Present Illness 82 y.o. male admitted on 08/20/18 for fever, AMS, and hypotension.  Pt with recent history of L TKA on 08/09/18.  Dx with septic shock (joint infection vs cellulitis), a-fib with prolonged QTC (cardiology consulted).  Pt with other significant PMH of R TKA, spondylolisthesis s/p lumbar spine surgery, SSS, PAF, HTN, HOH, gout, chornic systolic CHF, AA-thoracic, R shoulder RTC repair, R ankle peroneal tendon repair, and ACDF.       PT Comments    Patient is progressing very well towards their physical therapy goals. Ambulating 200 feet with walker and supervision. HR stable in 70s during mobility. POC remains appropriate.     Follow Up Recommendations  Home health PT;Supervision for mobility/OOB     Equipment Recommendations  None recommended by PT    Recommendations for Other Services       Precautions / Restrictions Precautions Precautions: Knee Precaution Comments: educated on positioning  Restrictions Weight Bearing Restrictions: Yes LLE Weight Bearing: Weight bearing as tolerated    Mobility  Bed Mobility Overal bed mobility: Needs Assistance Bed Mobility: Sit to Supine       Sit to supine: Mod assist   General bed mobility comments: modA for BLE negotiation  Transfers Overall transfer level: Needs assistance Equipment used: Rolling walker (2 wheeled) Transfers: Sit to/from Stand Sit to Stand: Min guard;From elevated surface         General transfer comment: Min guard assist from elevated bed, mostly to stablize RW for transition of hands, pt needed extra time to get to standing as well.   Ambulation/Gait Ambulation/Gait assistance: Supervision Gait Distance (Feet): 200 Feet Assistive device: Rolling walker (2 wheeled) Gait Pattern/deviations: Step-through pattern;Antalgic Gait velocity: decreased    General Gait Details: Pt with mildly antalgic gait pattern and tending to look down at their feet. Cues for decreased left step length and equal step lengths   Stairs             Wheelchair Mobility    Modified Rankin (Stroke Patients Only)       Balance Overall balance assessment: Needs assistance Sitting-balance support: No upper extremity supported;Feet supported       Standing balance support: Bilateral upper extremity supported Standing balance-Leahy Scale: Poor                              Cognition Arousal/Alertness: Awake/alert Behavior During Therapy: WFL for tasks assessed/performed Overall Cognitive Status: Within Functional Limits for tasks assessed                                        Exercises Total Joint Exercises Long Arc Quad: Left;15 reps;Seated    General Comments        Pertinent Vitals/Pain Pain Assessment: Faces Faces Pain Scale: Hurts a little bit Pain Location: L knee  Pain Descriptors / Indicators: Grimacing;Guarding Pain Intervention(s): Monitored during session    Home Living                      Prior Function            PT Goals (current goals can now be found in the care plan section) Acute Rehab PT Goals Patient Stated Goal: get back to  being active PT Goal Formulation: With patient Time For Goal Achievement: 09/06/18 Potential to Achieve Goals: Good Progress towards PT goals: Progressing toward goals    Frequency    Min 3X/week      PT Plan Current plan remains appropriate    Co-evaluation              AM-PAC PT "6 Clicks" Mobility   Outcome Measure  Help needed turning from your back to your side while in a flat bed without using bedrails?: None Help needed moving from lying on your back to sitting on the side of a flat bed without using bedrails?: A Little Help needed moving to and from a bed to a chair (including a wheelchair)?: A Little Help needed  standing up from a chair using your arms (e.g., wheelchair or bedside chair)?: A Little Help needed to walk in hospital room?: None Help needed climbing 3-5 steps with a railing? : A Little 6 Click Score: 20    End of Session   Activity Tolerance: Patient tolerated treatment well Patient left: in bed;with call bell/phone within reach Nurse Communication: Mobility status PT Visit Diagnosis: Muscle weakness (generalized) (M62.81);Difficulty in walking, not elsewhere classified (R26.2);Pain Pain - Right/Left: Left Pain - part of body: Knee     Time: 2233-6122 PT Time Calculation (min) (ACUTE ONLY): 26 min  Charges:  $Gait Training: 8-22 mins $Therapeutic Activity: 8-22 mins                    Ellamae Sia, PT, DPT Acute Rehabilitation Services Pager [phone] Office [phone]   Willy Eddy 08/24/2018, 4:53 PM

## 2018-08-24 NOTE — Progress Notes (Signed)
Patient had Hr in the 40;s, Patient is asymptomatic. . Dr Parke Simmers paged and informed, states to continue observation, meds will be changed as needed.care and observation continues.

## 2018-08-24 NOTE — Progress Notes (Addendum)
PROGRESS NOTE  Don Jacobs:[id number] DOB: 1936/08/06 DOA: 08/20/2018 PCP: [name], MD  HPI/Recap of past 5 hours:  82 year old male former smoker w/ hx of CHF, A. fib on Eliquis, pacemaker, thoracic aortic aneurysm, OSA on CPAP, and gout on daily prednisone who presented to the [facility] ED with fever to 106, altered mental status, and hypotension. Patient underwent a left total knee replacement on August 09, 2018 by Dr. Ulyses Amor in [place], [place]. Patient noticed redness swelling and pain along the incision site. Labs showed elevated white count at 13,000 and acute on chronic renal failure with serum creatinine at 1.6. He was admitted for septic shock.  08/24/2018: Patient seen and examined with his wife at bedside.  He denies any chest pain or palpitations or dyspnea at rest.  Bradycardia noted in telemetry box in the room.  Asymptomatic. Cardiology following.  Assessment/Plan: Principal Problem:   Infection of total left knee replacement (HCC) Active Problems:   Septic shock (HCC)   Pressure injury of skin   Cellulitis and abscess of left leg   AKI (acute kidney injury) ([place])  Resolving Septic Shock - Secondary to suspected left knee septic jointvs cellulitis  Sepsis physiology is resolving ID followingID directing abx dosing  Ortho has examined knee and does not feel this is septic prosthetic joint arthritis plan at this time is to complete 6 weeks of antibiotics via a PICC line  pt will need to f/u w/ his "index surgeon" as an outpt in short course after his d/c  Currently on Daptomycin due to AKI  P.A Fib with RVR on Chronic Anticoagulation with Elquis On Tikosyn for rhythm control  On coreg for rate control Cardiology following  QTC prolongation, persistent  QTC 547 Avoid QTC prolonging agents  Cardiology managing his cardiac meds  AKI on CKD 3 Creatinine level 1.81 frrom 1.90 Baseline 1.3 with GFR 49 Avoid nephrotoxic agents and repeat bmp  in the am  Chronic Systolic CHF TTE Sept 9562 EF 40-45% w/ diffuse hypokinesis (improved from 2017)  Bp has been soft making it difficult to diurese Monitor u/o C/w low sodium diet and fluid restriction   DVT prophylaxis: Eliquis  Code Status: FULL CODE Family Communication: spoke w/ wife at bedside at length   Disposition Plan: Dc home with HHPT RN possibly tomorrow or when cardiology signs off.  Consultants:  Ortho ID Cardiology   Antimicrobials:  Primaxin 11/23 > 11/23 Merropenem 11/23 >11/24 Vanc 11/23 >08/24/18 Daptomycin 11/27>>   Objective: Vitals:   08/23/18 2333 08/24/18 0305 08/24/18 0624 08/24/18 0800  BP: 103/73 96/61  100/83  Pulse: 67 (!) 133  (!) 122  Resp: 18 (!) 32  20  Temp: 98.1 F (36.7 C) 99.1 F (37.3 C)  98.6 F (37 C)  TempSrc: Oral Oral  Oral  SpO2: 99% 99%  94%  Weight:   101.1 kg   Height:        Intake/Output Summary (Last 24 hours) at 08/24/2018 1157 Last data filed at 08/24/2018 1040 Gross per 24 hour  Intake 713 ml  Output 1625 ml  Net -912 ml   Filed Weights   08/22/18 0500 08/23/18 0500 08/24/18 0624  Weight: 101.9 kg 101.9 kg 101.1 kg    Exam:  . General: 82 y.o. year-old male well developed well nourished in no acute distress.  Alert and oriented x3. . Cardiovascular: Regular rate and rhythm with no rubs or gallops.  No thyromegaly or JVD noted.   Marland Kitchen Respiratory: Mild  rales at bases no wheezes. Good inspiratory effort. . Abdomen: Soft nontender nondistended with normal bowel sounds x4 quadrants. . Musculoskeletal: Trace lower extremity edema. 2/4 pulses in all 4 extremities.  Surgical dressing on left knee. . Skin: No ulcerative lesions noted or rashes, . Psychiatry: Mood is appropriate for condition and setting   Data Reviewed: CBC: Recent Labs  Lab 08/20/18 1406 08/21/18 0454 08/22/18 0428 08/23/18 0407 08/24/18 0322  WBC 38.0* 21.4* 13.3* 8.3 5.6  NEUTROABS 34.3*  --   --   --   --   HGB 9.8* 8.6*  7.8* 7.9* 7.8*  HCT 31.3* 28.3* 25.7* 25.2* 25.6*  MCV 98.1 99.0 98.5 97.3 97.3  PLT 417* 278 234 267 401   Basic Metabolic Panel: Recent Labs  Lab 08/20/18 1406 08/21/18 0454 08/22/18 0428 08/23/18 0407 08/24/18 0322  NA 132* 132* 132* 133* 135  K 4.3 4.0 4.4 3.8 4.1  CL 103 103 103 102 104  CO2 19* 21* 22 22 23   GLUCOSE 131* 124* 123* 92 93  BUN 30* 26* 31* 49* 48*  CREATININE 1.73* 1.76* 1.76* 1.90* 1.81*  CALCIUM 8.4* 8.2* 8.4* 8.3* 8.4*  MG 1.7 2.1 1.9 2.1 2.1  PHOS  --   --   --   --  3.2   GFR: Estimated Creatinine Clearance: 38.2 mL/min (A) (by C-G formula based on SCr of 1.81 mg/dL (H)). Liver Function Tests: Recent Labs  Lab 08/24/18 0322  AST 107*  ALT 75*  ALKPHOS 76  BILITOT 1.3*  PROT 5.1*  ALBUMIN 2.2*   No results for input(s): LIPASE, AMYLASE in the last 168 hours. No results for input(s): AMMONIA in the last 168 hours. Coagulation Profile: No results for input(s): INR, PROTIME in the last 168 hours. Cardiac Enzymes: Recent Labs  Lab 08/22/18 0428 08/24/18 0322  CKTOTAL  --  28*  TROPONINI <0.03  --    BNP (last 3 results) No results for input(s): PROBNP in the last 8760 hours. HbA1C: No results for input(s): HGBA1C in the last 72 hours. CBG: Recent Labs  Lab 08/20/18 1139  GLUCAP 97   Lipid Profile: No results for input(s): CHOL, HDL, LDLCALC, TRIG, CHOLHDL, LDLDIRECT in the last 72 hours. Thyroid Function Tests: No results for input(s): TSH, T4TOTAL, FREET4, T3FREE, THYROIDAB in the last 72 hours. Anemia Panel: No results for input(s): VITAMINB12, FOLATE, FERRITIN, TIBC, IRON, RETICCTPCT in the last 72 hours. Urine analysis:    Component Value Date/Time   COLORURINE YELLOW 08/20/2018 1526   APPEARANCEUR HAZY (A) 08/20/2018 1526   LABSPEC 1.004 (L) 08/20/2018 1526   PHURINE 5.0 08/20/2018 1526   GLUCOSEU 50 (A) 08/20/2018 1526   HGBUR LARGE (A) 08/20/2018 1526   BILIRUBINUR NEGATIVE 08/20/2018 1526   KETONESUR NEGATIVE  08/20/2018 1526   PROTEINUR NEGATIVE 08/20/2018 1526   UROBILINOGEN 0.2 01/13/2015 0533   NITRITE NEGATIVE 08/20/2018 1526   LEUKOCYTESUR TRACE (A) 08/20/2018 1526   Sepsis Labs: @LABRCNTIP (procalcitonin:4,lacticidven:4)  ) Recent Results (from the past 240 hour(s))  MRSA PCR Screening     Status: None   Collection Time: 08/20/18 11:43 AM  Result Value Ref Range Status   MRSA by PCR NEGATIVE NEGATIVE Final    Comment:        The GeneXpert MRSA Assay (FDA approved for NASAL specimens only), is one component of a comprehensive MRSA colonization surveillance program. It is not intended to diagnose MRSA infection nor to guide or monitor treatment for MRSA infections. Performed at [facility],  [address], [place], [place] [address]   Culture, blood (routine x 2)     Status: None (Preliminary result)   Collection Time: 08/20/18  3:50 PM  Result Value Ref Range Status   Specimen Description BLOOD LEFT HAND  Final   Special Requests   Final    BOTTLES DRAWN AEROBIC ONLY Blood Culture adequate volume   Culture   Final    NO GROWTH 4 DAYS Performed at [facility], [place] [address], [place], [place] [address]    Report Status PENDING  Incomplete  Culture, blood (routine x 2)     Status: None (Preliminary result)   Collection Time: 08/20/18  3:57 PM  Result Value Ref Range Status   Specimen Description BLOOD LEFT HAND  Final   Special Requests   Final    BOTTLES DRAWN AEROBIC ONLY Blood Culture adequate volume   Culture   Final    NO GROWTH 4 DAYS Performed at [facility], [place] [address], [place], [place] [address]    Report Status PENDING  Incomplete      Studies: No results found.  Scheduled Meds: . allopurinol  300 mg Oral QHS  . apixaban  5 mg Oral BID  . carvedilol  3.125 mg Oral BID  . Chlorhexidine Gluconate Cloth  6 each Topical Daily  . dofetilide  250 mcg Oral BID  . levothyroxine  112 mcg Oral Q0600  . magnesium oxide  400 mg Oral  BID  . mouth rinse  15 mL Mouth Rinse BID  . sodium chloride flush  10-40 mL Intracatheter Q12H  . sodium chloride flush  3 mL Intravenous Q12H    Continuous Infusions: . sodium chloride Stopped (08/22/18 3435)  . DAPTOmycin (CUBICIN)  IV 670 mg (08/23/18 2039)  . DAPTOmycin (CUBICIN)  IV       LOS: 4 days     Kayleen Memos, MD [facility] Pager [phone]  If 7PM-7AM, please contact night-coverage [email or web address] Password [facility] 08/24/2018, 11:57 AM

## 2018-08-24 NOTE — Progress Notes (Signed)
Progress Note  Patient Name: Don Jacobs Date of Encounter: 08/24/2018  Primary Cardiologist: Larae Grooms, MD   Subjective   Feels ok.  Went back in to AFib last night.  Inpatient Medications    Scheduled Meds: . allopurinol  300 mg Oral QHS  . apixaban  5 mg Oral BID  . carvedilol  3.125 mg Oral BID  . Chlorhexidine Gluconate Cloth  6 each Topical Daily  . dofetilide  250 mcg Oral BID  . furosemide  40 mg Intravenous Once  . levothyroxine  112 mcg Oral Q0600  . magnesium oxide  400 mg Oral BID  . mouth rinse  15 mL Mouth Rinse BID  . sodium chloride flush  10-40 mL Intracatheter Q12H  . sodium chloride flush  3 mL Intravenous Q12H   Continuous Infusions: . sodium chloride Stopped (08/22/18 6384)  . DAPTOmycin (CUBICIN)  IV 670 mg (08/23/18 2039)   PRN Meds: sodium chloride, acetaminophen, sodium chloride flush, sodium chloride flush   Vital Signs    Vitals:   08/23/18 2128 08/23/18 2333 08/24/18 0305 08/24/18 0624  BP: (!) 110/58 103/73 96/61   Pulse: 68 67 (!) 133   Resp:  18 (!) 32   Temp:  98.1 F (36.7 C) 99.1 F (37.3 C)   TempSrc:  Oral Oral   SpO2:  99% 99%   Weight:    101.1 kg  Height:        Intake/Output Summary (Last 24 hours) at 08/24/2018 0815 Last data filed at 08/24/2018 0306 Gross per 24 hour  Intake 573 ml  Output 1100 ml  Net -527 ml   Filed Weights   08/22/18 0500 08/23/18 0500 08/24/18 0624  Weight: 101.9 kg 101.9 kg 101.1 kg    Telemetry    AFib, intermittent RVR - Personally Reviewed  ECG    AFib, RVR, widened QRS - Personally Reviewed  Physical Exam   GEN: No acute distress.   Neck: No JVD Cardiac: tachycardic, irregular, no murmurs, rubs, or gallops.  Respiratory: Clear to auscultation bilaterally. GI: Soft, nontender, non-distended  MS: 2+ Bilateral LE edema; No deformity. Neuro:  Nonfocal  Psych: Normal affect   Labs    Chemistry Recent Labs  Lab 08/22/18 0428 08/23/18 0407 08/24/18 0322    NA 132* 133* 135  K 4.4 3.8 4.1  CL 103 102 104  CO2 22 22 23   GLUCOSE 123* 92 93  BUN 31* 49* 48*  CREATININE 1.76* 1.90* 1.81*  CALCIUM 8.4* 8.3* 8.4*  PROT  --   --  5.1*  ALBUMIN  --   --  2.2*  AST  --   --  107*  ALT  --   --  75*  ALKPHOS  --   --  76  BILITOT  --   --  1.3*  GFRNONAA 35* 32* 35*  GFRAA 40* 37* 40*  ANIONGAP 7 9 8      Hematology Recent Labs  Lab 08/22/18 0428 08/23/18 0407 08/24/18 0322  WBC 13.3* 8.3 5.6  RBC 2.61* 2.59* 2.63*  HGB 7.8* 7.9* 7.8*  HCT 25.7* 25.2* 25.6*  MCV 98.5 97.3 97.3  MCH 29.9 30.5 29.7  MCHC 30.4 31.3 30.5  RDW 14.7 14.7 14.8  PLT 234 267 291    Cardiac Enzymes Recent Labs  Lab 08/22/18 0428  TROPONINI <0.03   No results for input(s): TROPIPOC in the last 168 hours.   BNPNo results for input(s): BNP, PROBNP in the last 168 hours.  DDimer No results for input(s): DDIMER in the last 168 hours.   Radiology    Vas [place] Lower Extremity Venous (dvt)  Result Date: 08/22/2018  Lower Venous Study Indications: Edema, and Cellulitis, sepsis, recent left TKR.  Limitations: Pain with compression of left leg, popliteal to ankle. Comparison Study: No prior study on file for comparison Performing Technologist: Sharion Dove RVS  Examination Guidelines: A complete evaluation includes B-mode imaging, spectral Doppler, color Doppler, and power Doppler as needed of all accessible portions of each vessel. Bilateral testing is considered an integral part of a complete examination. Limited examinations for reoccurring indications may be performed as noted.  Right Venous Findings: +---------+---------------+---------+-----------+----------+-------+          CompressibilityPhasicitySpontaneityPropertiesSummary +---------+---------------+---------+-----------+----------+-------+ CFV      Full           Yes      Yes                          +---------+---------------+---------+-----------+----------+-------+ SFJ      Full                                                  +---------+---------------+---------+-----------+----------+-------+ FV Prox  Full                                                 +---------+---------------+---------+-----------+----------+-------+ FV Mid   Full                                                 +---------+---------------+---------+-----------+----------+-------+ FV DistalFull                                                 +---------+---------------+---------+-----------+----------+-------+ PFV      Full                                                 +---------+---------------+---------+-----------+----------+-------+ POP      Full           Yes      Yes                          +---------+---------------+---------+-----------+----------+-------+ PTV      Full                                                 +---------+---------------+---------+-----------+----------+-------+ PERO     Full                                                 +---------+---------------+---------+-----------+----------+-------+  GSV      Full                                                 +---------+---------------+---------+-----------+----------+-------+  Left Venous Findings: +---------+---------------+---------+-----------+----------+------------------+          CompressibilityPhasicitySpontaneityPropertiesSummary            +---------+---------------+---------+-----------+----------+------------------+ CFV      Full           Yes      Yes                                     +---------+---------------+---------+-----------+----------+------------------+ SFJ      Full                                                            +---------+---------------+---------+-----------+----------+------------------+ FV Prox  Full                                                             +---------+---------------+---------+-----------+----------+------------------+ FV Mid   Full                                                            +---------+---------------+---------+-----------+----------+------------------+ FV DistalFull                                                            +---------+---------------+---------+-----------+----------+------------------+ PFV      Full                                                            +---------+---------------+---------+-----------+----------+------------------+ POP                     Yes      Yes                  patent by color                                                          Doppler            +---------+---------------+---------+-----------+----------+------------------+ PTV  patent by color                                                          Doppler            +---------+---------------+---------+-----------+----------+------------------+ PERO                                                  patent by color                                                          Doppler            +---------+---------------+---------+-----------+----------+------------------+ GSV      Full                                                            +---------+---------------+---------+-----------+----------+------------------+    Summary: Right: There is no evidence of deep vein thrombosis in the lower extremity.There is no evidence of superficial venous thrombosis. Left: There is no evidence of superficial venous thrombosis.There is no evidence of deep vein thrombosis in the lower extremity. However, portions of this examination were limited- see technologist comments above.  *See table(s) above for measurements and observations. Electronically signed by Deitra Mayo MD on 08/22/2018 at 4:21:38 PM.    Final    [place] Ekg  Site Rite  Result Date: 08/23/2018 If Site Rite image not attached, placement could not be confirmed due to current cardiac rhythm.   Cardiac Studies   EF 40-45%  Patient Profile     82 y.o. male AFib, recent joint replacement  Assessment & Plan    1) Dose Of IV Lasix today for fluid overload.  80 mg x 1.  Reassess vlume status later today.  2) AFib: Coreg and Tikosyn for rate control and rhythm control.  QTC is calculated as prolonged but difficult to assess with wide QRS and AFib RVR.    3) Plan is for longterm antibiotics.     For questions or updates, please contact [place] Please consult [email or web address] for contact info under        Signed, Larae Grooms, MD  08/24/2018, 8:15 AM

## 2018-08-24 NOTE — Progress Notes (Signed)
Pt developed a new  Left BBB , EKG done , DR Croituru  informed via amnion , instructed to administer Tikosyn as per treatment.

## 2018-08-24 NOTE — Progress Notes (Signed)
Patient places self on/off CPAP. Stated needed no assistance from RT.

## 2018-08-24 NOTE — Progress Notes (Signed)
Dr Parke Simmers reviewed pt in person to continue care .

## 2018-08-24 NOTE — Progress Notes (Signed)
Throughout the night pt HR would flip between A. Fib and NSR. Around 0305 pt HR started to increase between 110's-140's, asymptomatic. Pt normal HR is 60's. EKG completed and read A. Fib RVR. MD notified. Around 0330 pt HR decreased back to the 60's. Rhythm now A. Fib/Venticular paced. Will continue to monitor.  Tressie Ellis, RN

## 2018-08-24 NOTE — Progress Notes (Signed)
Pt HR elevated again ranging from the 110's to 150's, pt asymptomatic. MD notified, no new interventions implemented. Will continue to monitor.  Tressie Ellis, RN

## 2018-08-24 NOTE — Care Management Important Message (Signed)
Important Message  Patient Details  Name: Don Jacobs MRN: [id number] Date of Birth: 05/11/1936   Medicare Important Message Given:  Yes    [name] [name] 08/24/2018, 4:00 PM

## 2018-08-25 ENCOUNTER — Other Ambulatory Visit: Payer: Self-pay

## 2018-08-25 DIAGNOSIS — N183 Chronic kidney disease, stage 3 (moderate): Secondary | ICD-10-CM

## 2018-08-25 DIAGNOSIS — I5023 Acute on chronic systolic (congestive) heart failure: Secondary | ICD-10-CM

## 2018-08-25 DIAGNOSIS — I482 Chronic atrial fibrillation, unspecified: Secondary | ICD-10-CM

## 2018-08-25 DIAGNOSIS — T8454XS Infection and inflammatory reaction due to internal left knee prosthesis, sequela: Secondary | ICD-10-CM

## 2018-08-25 LAB — CULTURE, BLOOD (ROUTINE X 2)
CULTURE: NO GROWTH
Culture: NO GROWTH
SPECIAL REQUESTS: ADEQUATE
Special Requests: ADEQUATE

## 2018-08-25 LAB — BASIC METABOLIC PANEL
ANION GAP: 8 (ref 5–15)
BUN: 46 mg/dL — ABNORMAL HIGH (ref 8–23)
CHLORIDE: 102 mmol/L (ref 98–111)
CO2: 25 mmol/L (ref 22–32)
CREATININE: 1.93 mg/dL — AB (ref 0.61–1.24)
Calcium: 8.5 mg/dL — ABNORMAL LOW (ref 8.9–10.3)
GFR calc non Af Amer: 32 mL/min — ABNORMAL LOW (ref >60)
GFR, EST AFRICAN AMERICAN: 37 mL/min — AB (ref >60)
GLUCOSE: 107 mg/dL — AB (ref 70–99)
Potassium: 3.8 mmol/L (ref 3.5–5.1)
Sodium: 135 mmol/L (ref 135–145)

## 2018-08-25 LAB — CBC WITH DIFFERENTIAL/PLATELET
ABS IMMATURE GRANULOCYTES: 0.14 10*3/uL — AB (ref 0.00–0.07)
BASOS ABS: 0 10*3/uL (ref 0.0–0.1)
Basophils Relative: 1 %
Eosinophils Absolute: 0.1 10*3/uL (ref 0.0–0.5)
Eosinophils Relative: 2 %
HCT: 26.4 % — ABNORMAL LOW (ref 39.0–52.0)
HEMOGLOBIN: 8.2 g/dL — AB (ref 13.0–17.0)
Immature Granulocytes: 2 %
LYMPHS ABS: 1 10*3/uL (ref 0.7–4.0)
LYMPHS PCT: 17 %
MCH: 30 pg (ref 26.0–34.0)
MCHC: 31.1 g/dL (ref 30.0–36.0)
MCV: 96.7 fL (ref 80.0–100.0)
Monocytes Absolute: 0.5 10*3/uL (ref 0.1–1.0)
Monocytes Relative: 9 %
NRBC: 0 % (ref 0.0–0.2)
Neutro Abs: 4.3 10*3/uL (ref 1.7–7.7)
Neutrophils Relative %: 69 %
PLATELETS: 363 10*3/uL (ref 150–400)
RBC: 2.73 MIL/uL — AB (ref 4.22–5.81)
RDW: 14.8 % (ref 11.5–15.5)
WBC: 6.1 10*3/uL (ref 4.0–10.5)

## 2018-08-25 LAB — MAGNESIUM: Magnesium: 2.2 mg/dL (ref 1.7–2.4)

## 2018-08-25 MED ORDER — POTASSIUM CHLORIDE CRYS ER 20 MEQ PO TBCR: 20.0000 meq | EXTENDED_RELEASE_TABLET | Freq: Two times a day (BID) | ORAL | 0 refills | Status: AC

## 2018-08-25 NOTE — Progress Notes (Addendum)
Progress Note  Patient Name: Don Jacobs Date of Encounter: 08/25/2018  Primary Cardiologist: Larae Grooms, MD   Subjective   Feels well.  Wants to go home.  Inpatient Medications    Scheduled Meds: . allopurinol  300 mg Oral QHS  . apixaban  2.5 mg Oral BID  . carvedilol  3.125 mg Oral BID  . Chlorhexidine Gluconate Cloth  6 each Topical Daily  . dofetilide  250 mcg Oral BID  . levothyroxine  112 mcg Oral Q0600  . magnesium oxide  400 mg Oral BID  . mouth rinse  15 mL Mouth Rinse BID  . sodium chloride flush  10-40 mL Intracatheter Q12H  . sodium chloride flush  3 mL Intravenous Q12H   Continuous Infusions: . sodium chloride Stopped (08/22/18 2505)  . DAPTOmycin (CUBICIN)  IV 670 mg (08/24/18 1522)   PRN Meds: sodium chloride, acetaminophen, sodium chloride flush, sodium chloride flush   Vital Signs    Vitals:   08/25/18 0048 08/25/18 0300 08/25/18 0314 08/25/18 0720  BP: (!) 108/59 105/65 105/65 101/71  Pulse: (!) 54 62  62  Resp: (!) 25 (!) 21 (!) 23 19  Temp: 99.9 F (37.7 C) 99.2 F (37.3 C)  98.2 F (36.8 C)  TempSrc: Axillary Oral  Oral  SpO2: 93% 95%  96%  Weight:  99.3 kg    Height:        Intake/Output Summary (Last 24 hours) at 08/25/2018 0846 Last data filed at 08/25/2018 3976 Gross per 24 hour  Intake 1080 ml  Output 2475 ml  Net -1395 ml   Filed Weights   08/23/18 0500 08/24/18 0624 08/25/18 0300  Weight: 101.9 kg 101.1 kg 99.3 kg    Telemetry    ntermittent ventricular pacing- Personally Reviewed  ECG    Sinus rhythm on November 27- Personally Reviewed  Physical Exam   GEN: No acute distress.   Neck: No JVD Cardiac: RRR, no murmurs, rubs, or gallops.  Respiratory: Clear to auscultation bilaterally. GI: Soft, nontender, non-distended  MS: Decreased leg edema bilaterally; No deformity. Neuro:  Nonfocal  Psych: Normal affect   Labs    Chemistry Recent Labs  Lab 08/23/18 0407 08/24/18 0322 08/25/18 0530  NA  133* 135 135  K 3.8 4.1 3.8  CL 102 104 102  CO2 22 23 25   GLUCOSE 92 93 107*  BUN 49* 48* 46*  CREATININE 1.90* 1.81* 1.93*  CALCIUM 8.3* 8.4* 8.5*  PROT  --  5.1*  --   ALBUMIN  --  2.2*  --   AST  --  107*  --   ALT  --  75*  --   ALKPHOS  --  76  --   BILITOT  --  1.3*  --   GFRNONAA 32* 35* 32*  GFRAA 37* 40* 37*  ANIONGAP 9 8 8      Hematology Recent Labs  Lab 08/23/18 0407 08/24/18 0322 08/25/18 0530  WBC 8.3 5.6 6.1  RBC 2.59* 2.63* 2.73*  HGB 7.9* 7.8* 8.2*  HCT 25.2* 25.6* 26.4*  MCV 97.3 97.3 96.7  MCH 30.5 29.7 30.0  MCHC 31.3 30.5 31.1  RDW 14.7 14.8 14.8  PLT 267 291 363    Cardiac Enzymes Recent Labs  Lab 08/22/18 0428  TROPONINI <0.03   No results for input(s): TROPIPOC in the last 168 hours.   BNPNo results for input(s): BNP, PROBNP in the last 168 hours.   DDimer No results for input(s): DDIMER in the  last 168 hours.   Radiology    [place] Ekg Site Rite  Result Date: 08/23/2018 If [facility] image not attached, placement could not be confirmed due to current cardiac rhythm.   Cardiac Studies   EF 40-45%  Patient Profile     82 y.o. male with NICM, AFib  Assessment & Plan    1) AFib: CONtinue [place] and ELiquis.  Dose of ELiquis adjusted per the pharmacy, due to renal insufficiency- CKD stage 3.  Back in NSR.  HR better today.  2) acute on chronic systolic heart failure: Much improved after dose of IV Lasix yesterday.  He will resume his home dose of oral Lasix.  3) Anemia: May be contributing to his atrial fibrillation.  He may need to go back on iron supplementation to help with his hemoglobin.  4) His wife is a retired Marine scientist.  She will check his blood pressure and administer medications appropriately.  She may start with lower doses of his home meds and titrate up to the normal doses.  He has not had severe issues with hypertension in the past.  He is on these medicines more for his cardiomyopathy.  OK to discharge from a cardiac  standpoint.     For questions or updates, please contact [place] Please consult [email or web address] for contact info under        Signed, Larae Grooms, MD  08/25/2018, 8:46 AM

## 2018-08-25 NOTE — Care Management Note (Addendum)
Case Management Note  Patient Details  Name: Don Jacobs MRN: [id number] Date of Birth: 11/10/1935  Subjective/Objective: Pt presented for Infection Left total knee. PTA from home with wife. Previous CM offered choice with [facility]- this CM did make Don Jacobs aware of transition home today.                    Action/Plan: CM did speak with Don Jacobs with [facility] and IV antibiotics will be delivered 08-26-18 am and [facility] will administer tomorrow. No further needs from CM at this time.    Expected Discharge Date:                  Expected Discharge Plan:  [place]  In-House Referral:  NA  Discharge planning Services  CM Consult  Post Acute Care Choice:  Home Health Choice offered to:  Patient  DME Arranged:  N/A DME Agency:  NA  HH Arranged:  RN, PT, IV Antibiotics HH Agency:  [facility]  Status of Service:  Completed, signed off  If discussed at [place] of Stay Meetings, dates discussed:    Additional Comments: D/c summary faxed to [place] 08-25-18 @ 1212.   Don Roys, RN 08/25/2018, 10:45 AM

## 2018-08-25 NOTE — Discharge Summary (Addendum)
Discharge Summary  Don Jacobs:[id number] DOB: [birth year]  PCP: [name], MD  Admit date: 08/20/2018 Discharge date: 08/25/2018  Time spent: 35 minutes  Recommendations for Outpatient Follow-up:  1. Follow-up with infectious disease 2. Follow-up with your orthopedic surgeon 3. Follow-up with cardiology 4. Follow-up with your PCP 5. Take your medications as prescribed  Discharge Diagnoses:  Active Hospital Problems   Diagnosis Date Noted  . Infection of total left knee replacement ([place]) 08/22/2018  . AKI (acute kidney injury) ([place])   . Septic shock ([place]) 08/20/2018  . Pressure injury of skin 08/20/2018  . Cellulitis and abscess of left leg     Resolved Hospital Problems  No resolved problems to display.    Discharge Condition: Stable  Diet recommendation: Heart healthy diet  Vitals:   08/25/18 0720 08/25/18 1109  BP: 101/71 98/60  Pulse: 62 (!) 59  Resp: 19 (!) 22  Temp: 98.2 F (36.8 C) 98.8 F (37.1 C)  SpO2: 96% 99%    History of present illness:  82 year old male former smoker w/ hx of CHF,A. fib on Eliquis, pacemaker, thoracic aortic aneurysm, OSA on CPAP,andgout on daily prednisone [facility], altered mental status, and hypotension. Patient underwent a left total knee replacement on August 09, 2018 by Dr. Sharma Covert [place]. Patientnoticed redness swelling and pain along the incision site. Labs showed elevated white count at 13,000 and acute on chronic renal failure with serum creatinine at 1.6. He was admitted for septic shock.  08/25/2018: Patient seen and examined with his wife at bedside.  No acute events overnight.  He has no new complaints.  Denies any chest pain, palpitations or dyspnea at rest.  On the day of discharge, the patient was hemodynamically stable.  He will need to follow-up with his cardiologist and orthopedic surgeon posthospitalization.  Please take your medications  as prescribed.    Hospital Course:  Principal Problem:   Infection of total left knee replacement (HCC) Active Problems:   Septic shock (HCC)   Pressure injury of skin   Cellulitis and abscess of left leg   AKI (acute kidney injury) ([place])  Resolving Septic Shock- Secondary to suspected left knee septic jointvs cellulitis Sepsis physiology is resolving ID directing abx dosing  Ortho has examined knee and does not feel this is septic prosthetic joint arthritis plan at this time is to complete 6 weeks of antibiotics via a PICC line  pt will need to f/u w/ his "index surgeon" as an outpt in short course after his d/c Currently on Daptomycin due to AKI Stopped IV vancomycin due to AKI  Questionable new left bundle branch block/ prolonged QTC Cardiology followed and per cardiology intermittent ventricular pacing. Recommended continuing cardiology medications as prescribed Okay to discharge home per cardiology on 08/25/2018 Follow-up with your cardiologist outpatient  P.A Fib with RVR on Chronic Anticoagulation with Elquis On Tikosyn for rhythm control On coreg for rate control Cardiology following Follow-up with your cardiologist outpatient  QTC prolongation, persistent  QTC 538 from 519 Avoid QTC prolonging agents  Cardiology managing his cardiac meds and recommended continuing present meds  AKI on CKD 3 Creatinine level 1.93 from 1.81 from 1.90 Baseline 1.3 with GFR 49 Avoid nephrotoxic agents  Follow-up with your PCP post hospitalization  Chronic combined diastolic and SystolicCHF, poa TTESept 3149 EF 40-45%w/diffuse hypokinesis (improved from 2017) Bp has been soft making it difficult to diurese Monitor u/o C/w low sodium diet and fluid restriction   DVT  prophylaxis:Eliquis Code Status:FULL CODE Family Communication:spoke w/ wife at bedside.  Consultants: Ortho ID Cardiology  Antimicrobials: Primaxin 11/23 > 11/23 Merropenem 11/23  >11/24 Vanc 11/23 >08/24/18 Daptomycin 11/27>>    Discharge Exam: BP 98/60 (BP Location: Left Arm)   Pulse (!) 59   Temp 98.8 F (37.1 C) (Oral)   Resp (!) 22   Ht 5' 9" (1.753 m)   Wt 99.3 kg   SpO2 99%   BMI 32.33 kg/m  . General: 82 y.o. year-old male well developed well nourished in no acute distress.  Alert and oriented x3. . Cardiovascular: Regular rate and rhythm with no rubs or gallops.  No thyromegaly or JVD noted.   . Respiratory: Clear to auscultation with no wheezes or rales. Good inspiratory effort. . Abdomen: Soft nontender nondistended with normal bowel sounds x4 quadrants. . Musculoskeletal: Trace lower extremity edema.  Surgical dressing on left knee.  2/4 pulses in all 4 extremities. . Psychiatry: Mood is appropriate for condition and setting  Discharge Instructions You were cared for by a hospitalist during your hospital stay. If you have any questions about your discharge medications or the care you received while you were in the hospital after you are discharged, you can call the unit and asked to speak with the hospitalist on call if the hospitalist that took care of you is not available. Once you are discharged, your primary care physician will handle any further medical issues. Please note that NO REFILLS for any discharge medications will be authorized once you are discharged, as it is imperative that you return to your primary care physician (or establish a relationship with a primary care physician if you do not have one) for your aftercare needs so that they can reassess your need for medications and monitor your lab values.  Discharge Instructions    Home infusion instructions [facility] May follow [facility] Dosing Protocol; May administer Cathflo as needed to maintain patency of vascular access device.; Flushing of vascular access device: per [facility] Protocol: 0.9% NaCl pre/post medica...   Complete by:  As directed    Instructions:  May follow [facility]  Pharmacy Dosing Protocol   Instructions:  May administer Cathflo as needed to maintain patency of vascular access device.   Instructions:  Flushing of vascular access device: per [facility] Protocol: 0.9% NaCl pre/post medication administration and prn patency; Heparin 100 u/ml, 5ml for implanted ports and Heparin 10u/ml, 5ml for all other central venous catheters.   Instructions:  May follow [facility] Anaphylaxis Protocol for First Dose Administration in the home: 0.9% NaCl at 25-50 ml/hr to maintain IV access for protocol meds. Epinephrine 0.3 ml IV/IM PRN and Benadryl 25-50 IV/IM PRN s/s of anaphylaxis.   Instructions:  [facility] Infusion Coordinator (RN) to assist per patient IV care needs in the home PRN.     Allergies as of 08/25/2018      Reactions   Penicillins Hives, Shortness Of Breath, Rash   Ended up at ER after using Has patient had a PCN reaction causing immediate rash, facial/tongue/throat swelling, SOB or lightheadedness with hypotension: Yes Has patient had a PCN reaction causing severe rash involving mucus membranes or skin necrosis: No Has patient had a PCN reaction that required hospitalization: No Has patient had a PCN reaction occurring within the last 10 years: No If all of the above answers are "NO", then may proceed with Cephalosporin use.   Oxycodone Other (See Comments)   Altered mental status   Ibuprofen Other (  See Comments)   Is on Eliquis; not suppose to have this now      Medication List    STOP taking these medications   acetaminophen 500 MG tablet Commonly known as:  TYLENOL   apixaban 5 MG Tabs tablet Commonly known as:  ELIQUIS   benzonatate 200 MG capsule Commonly known as:  TESSALON   clindamycin 300 MG capsule Commonly known as:  CLEOCIN   guaiFENesin 100 MG/5ML Soln Commonly known as:  ROBITUSSIN   lisinopril 5 MG tablet Commonly known as:  PRINIVIL,ZESTRIL   traMADol 50 MG tablet Commonly known as:  ULTRAM     TAKE these medications     allopurinol 300 MG tablet Commonly known as:  ZYLOPRIM Take 300 mg by mouth at bedtime.   CALTRATE 600 PO Take 600 mg by mouth at bedtime.   carvedilol 3.125 MG tablet Commonly known as:  COREG TAKE 1 TABLET BY MOUTH TWICE DAILY What changed:  when to take this   Cinnamon 500 MG capsule Take 500 mg by mouth every evening.   colchicine 0.6 MG tablet Take 0.6 mg by mouth daily as needed (for gout flares).   daptomycin  IVPB Commonly known as:  CUBICIN Inject 670 mg into the vein daily. Indication:  Prosthetic joint infection Last Day of Therapy:  10/01/18 Labs - Once weekly:  CBC/D, BMP, and CPK Labs - Every other week:  ESR and CRP   dofetilide 250 MCG capsule Commonly known as:  TIKOSYN Take 1 capsule (250 mcg total) by mouth 2 (two) times daily.   furosemide 40 MG tablet Commonly known as:  LASIX TAKE 1 TABLET BY MOUTH DAILY.   levothyroxine 112 MCG tablet Commonly known as:  SYNTHROID, LEVOTHROID Take 1 tablet (112 mcg total) by mouth daily before breakfast.   loperamide 2 MG tablet Commonly known as:  IMODIUM A-D Take 2 mg by mouth 4 (four) times daily as needed for diarrhea or loose stools.   loratadine 10 MG tablet Commonly known as:  CLARITIN Take 5 mg by mouth 2 (two) times daily as needed for allergies or rhinitis.   Magnesium 400 MG Caps Take 400 mg by mouth 2 (two) times daily. What changed:  when to take this   NON FORMULARY Take 1 capsule by mouth See admin instructions. N4- Greens vegetable capsule: Take 1 capsule by mouth once a day   OVER THE COUNTER MEDICATION Take 1 capsule by mouth See admin instructions. [place] Grape Seed Extract: Take 1 capsule by mouth in the evening   polyvinyl alcohol 1.4 % ophthalmic solution Commonly known as:  LIQUIFILM TEARS Place 1 drop into both eyes 3 (three) times daily as needed for dry eyes.   potassium chloride SA 20 MEQ tablet Commonly known as:  K-DUR,KLOR-CON Take 1 tablet (20 mEq total) by mouth 2  (two) times daily. What changed:  when to take this   predniSONE 10 MG tablet Commonly known as:  DELTASONE Take 5-10 mg by mouth daily as needed (for gout flares).   Saw Palmetto Caps Take 1 capsule by mouth daily.   vitamin B-12 500 MCG tablet Commonly known as:  CYANOCOBALAMIN Take 500 mcg by mouth every evening.   vitamin C 500 MG tablet Commonly known as:  ASCORBIC ACID Take 500 mg by mouth daily.   Vitamin D3 50 MCG (2000 UT) Tabs Take 2,000 Units by mouth daily.            Home Infusion Instuctions  (From admission, onward)           Start     Ordered   08/24/18 0000  Home infusion instructions [facility] May follow [facility] Dosing Protocol; May administer Cathflo as needed to maintain patency of vascular access device.; Flushing of vascular access device: per [facility] Protocol: 0.9% NaCl pre/post medica...    Question Answer Comment  Instructions May follow [facility] Dosing Protocol   Instructions May administer Cathflo as needed to maintain patency of vascular access device.   Instructions Flushing of vascular access device: per [facility] Protocol: 0.9% NaCl pre/post medication administration and prn patency; Heparin 100 u/ml, 5ml for implanted ports and Heparin 10u/ml, 5ml for all other central venous catheters.   Instructions May follow [facility] Anaphylaxis Protocol for First Dose Administration in the home: 0.9% NaCl at 25-50 ml/hr to maintain IV access for protocol meds. Epinephrine 0.3 ml IV/IM PRN and Benadryl 25-50 IV/IM PRN s/s of anaphylaxis.   Instructions [facility] Infusion Coordinator (RN) to assist per patient IV care needs in the home PRN.      08/24/18 1058           Durable Medical Equipment  (From admission, onward)         Start     Ordered   08/24/18 1230  For home use only DME IV pump/equipment  Once     08/24/18 1229         Allergies  Allergen Reactions  . Penicillins Hives, Shortness Of Breath and Rash    Ended up at  ER after using Has patient had a PCN reaction causing immediate rash, facial/tongue/throat swelling, SOB or lightheadedness with hypotension: Yes Has patient had a PCN reaction causing severe rash involving mucus membranes or skin necrosis: No Has patient had a PCN reaction that required hospitalization: No Has patient had a PCN reaction occurring within the last 10 years: No If all of the above answers are "NO", then may proceed with Cephalosporin use.   . Oxycodone Other (See Comments)    Altered mental status  . Ibuprofen Other (See Comments)    Is on Eliquis; not suppose to have this now   Follow-up Information    [facility] Follow up.   Specialty:  Home Health Services Why:  Registered Nurse, Physical Therapy IV antibiotics via [facility].  Contact information: [address] [place] [address] [phone]            The results of significant diagnostics from this hospitalization (including imaging, microbiology, ancillary and laboratory) are listed below for reference.    Significant Diagnostic Studies: Dg Chest Port 1 View  Result Date: 08/20/2018 CLINICAL DATA:  Central chest soreness. Fall on the 14. Shortness of breath. EXAM: PORTABLE CHEST 1 VIEW COMPARISON:  08/20/2018 FINDINGS: Cardiac pacemaker. Right central venous catheter with tip over the low SVC region. No pneumothorax. Cardiac enlargement. Shallow inspiration with bilateral basilar atelectasis. Possible bilateral pleural effusions. Degenerative changes in the spine and shoulders. Postoperative change in the cervical spine. IMPRESSION: Cardiac enlargement. Shallow inspiration with bilateral basilar atelectasis and probable pleural effusions. Electronically Signed   By: William  Stevens M.D.   On: 08/20/2018 21:28   Dg Knee Left Port  Result Date: 08/20/2018 CLINICAL DATA:  Sepsis EXAM: PORTABLE LEFT KNEE - 1-2 VIEW COMPARISON:  Radiographs from 08/09/2018  FINDINGS: Right total knee prosthesis observed. No appreciable fracture or abnormal lucency along the prosthesis. Skin staples are in place anteriorly. There is some chronic fragmentation of a superior spur.   The gas is in the soft tissues shown on the postoperative radiograph have resolved. There does appear to be a likely effusion in the suprapatellar bursa, with indistinctness of local fat planes. IMPRESSION: 1. Suspected effusion in the suprapatellar bursa. 2. Right total knee prosthesis in place without complicating feature. Electronically Signed   By: Van Clines M.D.   On: 08/20/2018 16:34   Vas [place] Lower Extremity Venous (dvt)  Result Date: 08/22/2018  Lower Venous Study Indications: Edema, and Cellulitis, sepsis, recent left TKR.  Limitations: Pain with compression of left leg, popliteal to ankle. Comparison Study: No prior study on file for comparison Performing Technologist: Sharion Dove RVS  Examination Guidelines: A complete evaluation includes B-mode imaging, spectral Doppler, color Doppler, and power Doppler as needed of all accessible portions of each vessel. Bilateral testing is considered an integral part of a complete examination. Limited examinations for reoccurring indications may be performed as noted.  Right Venous Findings: +---------+---------------+---------+-----------+----------+-------+          CompressibilityPhasicitySpontaneityPropertiesSummary +---------+---------------+---------+-----------+----------+-------+ CFV      Full           Yes      Yes                          +---------+---------------+---------+-----------+----------+-------+ SFJ      Full                                                 +---------+---------------+---------+-----------+----------+-------+ FV Prox  Full                                                 +---------+---------------+---------+-----------+----------+-------+ FV Mid   Full                                                  +---------+---------------+---------+-----------+----------+-------+ FV DistalFull                                                 +---------+---------------+---------+-----------+----------+-------+ PFV      Full                                                 +---------+---------------+---------+-----------+----------+-------+ POP      Full           Yes      Yes                          +---------+---------------+---------+-----------+----------+-------+ PTV      Full                                                 +---------+---------------+---------+-----------+----------+-------+  PERO     Full                                                 +---------+---------------+---------+-----------+----------+-------+ GSV      Full                                                 +---------+---------------+---------+-----------+----------+-------+  Left Venous Findings: +---------+---------------+---------+-----------+----------+------------------+          CompressibilityPhasicitySpontaneityPropertiesSummary            +---------+---------------+---------+-----------+----------+------------------+ CFV      Full           Yes      Yes                                     +---------+---------------+---------+-----------+----------+------------------+ SFJ      Full                                                            +---------+---------------+---------+-----------+----------+------------------+ FV Prox  Full                                                            +---------+---------------+---------+-----------+----------+------------------+ FV Mid   Full                                                            +---------+---------------+---------+-----------+----------+------------------+ FV DistalFull                                                             +---------+---------------+---------+-----------+----------+------------------+ PFV      Full                                                            +---------+---------------+---------+-----------+----------+------------------+ POP                     Yes      Yes                  patent by color  Doppler            +---------+---------------+---------+-----------+----------+------------------+ PTV                                                   patent by color                                                          Doppler            +---------+---------------+---------+-----------+----------+------------------+ PERO                                                  patent by color                                                          Doppler            +---------+---------------+---------+-----------+----------+------------------+ GSV      Full                                                            +---------+---------------+---------+-----------+----------+------------------+    Summary: Right: There is no evidence of deep vein thrombosis in the lower extremity.There is no evidence of superficial venous thrombosis. Left: There is no evidence of superficial venous thrombosis.There is no evidence of deep vein thrombosis in the lower extremity. However, portions of this examination were limited- see technologist comments above.  *See table(s) above for measurements and observations. Electronically signed by Christopher Dickson MD on 08/22/2018 at 4:21:38 PM.    Final    Us Ekg Site Rite  Result Date: 08/23/2018 If Site Rite image not attached, placement could not be confirmed due to current cardiac rhythm.   Microbiology: Recent Results (from the past 240 hour(s))  MRSA PCR Screening     Status: None   Collection Time: 08/20/18 11:43 AM  Result Value Ref Range Status   MRSA by  PCR NEGATIVE NEGATIVE Final    Comment:        The GeneXpert MRSA Assay (FDA approved for NASAL specimens only), is one component of a comprehensive MRSA colonization surveillance program. It is not intended to diagnose MRSA infection nor to guide or monitor treatment for MRSA infections. Performed at [facility], [address] [place], [place] [address]   Culture, blood (routine x 2)     Status: None   Collection Time: 08/20/18  3:50 PM  Result Value Ref Range Status   Specimen Description BLOOD LEFT HAND  Final   Special Requests   Final    BOTTLES DRAWN AEROBIC ONLY Blood Culture adequate volume   Culture   Final    NO GROWTH   5 DAYS Performed at [facility], [place] [address] [place], [place] [address]    Report Status 08/25/2018 FINAL  Final  Culture, blood (routine x 2)     Status: None   Collection Time: 08/20/18  3:57 PM  Result Value Ref Range Status   Specimen Description BLOOD LEFT HAND  Final   Special Requests   Final    BOTTLES DRAWN AEROBIC ONLY Blood Culture adequate volume   Culture   Final    NO GROWTH 5 DAYS Performed at [facility], [place] [address] [place], [place] [address]    Report Status 08/25/2018 FINAL  Final     Labs: Basic Metabolic Panel: Recent Labs  Lab 08/21/18 0454 08/22/18 0428 08/23/18 0407 08/24/18 0322 08/25/18 0530  NA 132* 132* 133* 135 135  K 4.0 4.4 3.8 4.1 3.8  CL 103 103 102 104 102  CO2 21* _0 GLUCOSE 124* 123* 92 93 107*  BUN 26* 31* 49* 48* 46*  CREATININE 1.76* 1.76* 1.90* 1.81* 1.93*  CALCIUM 8.2* 8.4* 8.3* 8.4* 8.5*  MG 2.1 1.9 2.1 2.1 2.2  PHOS  --   --   --  3.2  --    Liver Function Tests: Recent Labs  Lab 08/24/18 0322  AST 107*  ALT 75*  ALKPHOS 76  BILITOT 1.3*  PROT 5.1*  ALBUMIN 2.2*   No results for input(s): LIPASE, AMYLASE in the last 168 hours. No results for input(s): AMMONIA in the last 168 hours. CBC: Recent Labs  Lab 08/20/18 1406 08/21/18 0454  08/22/18 0428 08/23/18 0407 08/24/18 0322 08/25/18 0530  WBC 38.0* 21.4* 13.3* 8.3 5.6 6.1  NEUTROABS 34.3*  --   --   --   --  4.3  HGB 9.8* 8.6* 7.8* 7.9* 7.8* 8.2*  HCT 31.3* 28.3* 25.7* 25.2* 25.6* 26.4*  MCV 98.1 99.0 98.5 97.3 97.3 96.7  PLT 417* 278 234 267 291 363   Cardiac Enzymes: Recent Labs  Lab 08/22/18 0428 08/24/18 0322  CKTOTAL  --  28*  TROPONINI <0.03  --    BNP: BNP (last 3 results) No results for input(s): BNP in the last 8760 hours.  ProBNP (last 3 results) No results for input(s): PROBNP in the last 8760 hours.  CBG: Recent Labs  Lab 08/20/18 1139  GLUCAP 97       Signed:  Kayleen Memos, MD [facility] 08/25/2018, 11:29 AM

## 2018-08-26 DIAGNOSIS — Z87891 Personal history of nicotine dependence: Secondary | ICD-10-CM | POA: Diagnosis not present

## 2018-08-26 DIAGNOSIS — M109 Gout, unspecified: Secondary | ICD-10-CM | POA: Diagnosis not present

## 2018-08-26 DIAGNOSIS — I5022 Chronic systolic (congestive) heart failure: Secondary | ICD-10-CM | POA: Diagnosis not present

## 2018-08-26 DIAGNOSIS — I4819 Other persistent atrial fibrillation: Secondary | ICD-10-CM | POA: Diagnosis not present

## 2018-08-26 DIAGNOSIS — N189 Chronic kidney disease, unspecified: Secondary | ICD-10-CM | POA: Diagnosis not present

## 2018-08-26 DIAGNOSIS — I712 Thoracic aortic aneurysm, without rupture: Secondary | ICD-10-CM | POA: Diagnosis not present

## 2018-08-26 DIAGNOSIS — Z9181 History of falling: Secondary | ICD-10-CM | POA: Diagnosis not present

## 2018-08-26 DIAGNOSIS — I130 Hypertensive heart and chronic kidney disease with heart failure and stage 1 through stage 4 chronic kidney disease, or unspecified chronic kidney disease: Secondary | ICD-10-CM | POA: Diagnosis not present

## 2018-08-26 DIAGNOSIS — G4733 Obstructive sleep apnea (adult) (pediatric): Secondary | ICD-10-CM | POA: Diagnosis not present

## 2018-08-26 DIAGNOSIS — Z452 Encounter for adjustment and management of vascular access device: Secondary | ICD-10-CM | POA: Diagnosis not present

## 2018-08-26 DIAGNOSIS — L03116 Cellulitis of left lower limb: Secondary | ICD-10-CM | POA: Diagnosis not present

## 2018-08-26 DIAGNOSIS — Z950 Presence of cardiac pacemaker: Secondary | ICD-10-CM | POA: Diagnosis not present

## 2018-08-26 DIAGNOSIS — E039 Hypothyroidism, unspecified: Secondary | ICD-10-CM | POA: Diagnosis not present

## 2018-08-26 DIAGNOSIS — T8454XA Infection and inflammatory reaction due to internal left knee prosthesis, initial encounter: Secondary | ICD-10-CM | POA: Diagnosis not present

## 2018-08-26 DIAGNOSIS — N400 Enlarged prostate without lower urinary tract symptoms: Secondary | ICD-10-CM | POA: Diagnosis not present

## 2018-08-26 DIAGNOSIS — J309 Allergic rhinitis, unspecified: Secondary | ICD-10-CM | POA: Diagnosis not present

## 2018-08-26 DIAGNOSIS — Z792 Long term (current) use of antibiotics: Secondary | ICD-10-CM | POA: Diagnosis not present

## 2018-08-27 DIAGNOSIS — L03116 Cellulitis of left lower limb: Secondary | ICD-10-CM | POA: Diagnosis not present

## 2018-08-27 DIAGNOSIS — I130 Hypertensive heart and chronic kidney disease with heart failure and stage 1 through stage 4 chronic kidney disease, or unspecified chronic kidney disease: Secondary | ICD-10-CM | POA: Diagnosis not present

## 2018-08-27 DIAGNOSIS — T8454XA Infection and inflammatory reaction due to internal left knee prosthesis, initial encounter: Secondary | ICD-10-CM | POA: Diagnosis not present

## 2018-08-27 DIAGNOSIS — I4819 Other persistent atrial fibrillation: Secondary | ICD-10-CM | POA: Diagnosis not present

## 2018-08-27 DIAGNOSIS — N189 Chronic kidney disease, unspecified: Secondary | ICD-10-CM | POA: Diagnosis not present

## 2018-08-27 DIAGNOSIS — I5022 Chronic systolic (congestive) heart failure: Secondary | ICD-10-CM | POA: Diagnosis not present

## 2018-08-28 DIAGNOSIS — T8454XA Infection and inflammatory reaction due to internal left knee prosthesis, initial encounter: Secondary | ICD-10-CM | POA: Diagnosis not present

## 2018-08-28 DIAGNOSIS — I5022 Chronic systolic (congestive) heart failure: Secondary | ICD-10-CM | POA: Diagnosis not present

## 2018-08-28 DIAGNOSIS — I4819 Other persistent atrial fibrillation: Secondary | ICD-10-CM | POA: Diagnosis not present

## 2018-08-28 DIAGNOSIS — N189 Chronic kidney disease, unspecified: Secondary | ICD-10-CM | POA: Diagnosis not present

## 2018-08-28 DIAGNOSIS — L03116 Cellulitis of left lower limb: Secondary | ICD-10-CM | POA: Diagnosis not present

## 2018-08-28 DIAGNOSIS — I130 Hypertensive heart and chronic kidney disease with heart failure and stage 1 through stage 4 chronic kidney disease, or unspecified chronic kidney disease: Secondary | ICD-10-CM | POA: Diagnosis not present

## 2018-08-30 DIAGNOSIS — L03116 Cellulitis of left lower limb: Secondary | ICD-10-CM | POA: Diagnosis not present

## 2018-08-30 DIAGNOSIS — N189 Chronic kidney disease, unspecified: Secondary | ICD-10-CM | POA: Diagnosis not present

## 2018-08-30 DIAGNOSIS — I5022 Chronic systolic (congestive) heart failure: Secondary | ICD-10-CM | POA: Diagnosis not present

## 2018-08-30 DIAGNOSIS — I4819 Other persistent atrial fibrillation: Secondary | ICD-10-CM | POA: Diagnosis not present

## 2018-08-30 DIAGNOSIS — I130 Hypertensive heart and chronic kidney disease with heart failure and stage 1 through stage 4 chronic kidney disease, or unspecified chronic kidney disease: Secondary | ICD-10-CM | POA: Diagnosis not present

## 2018-08-30 DIAGNOSIS — T8454XA Infection and inflammatory reaction due to internal left knee prosthesis, initial encounter: Secondary | ICD-10-CM | POA: Diagnosis not present

## 2018-08-31 DIAGNOSIS — I4819 Other persistent atrial fibrillation: Secondary | ICD-10-CM | POA: Diagnosis not present

## 2018-08-31 DIAGNOSIS — I130 Hypertensive heart and chronic kidney disease with heart failure and stage 1 through stage 4 chronic kidney disease, or unspecified chronic kidney disease: Secondary | ICD-10-CM | POA: Diagnosis not present

## 2018-08-31 DIAGNOSIS — I5022 Chronic systolic (congestive) heart failure: Secondary | ICD-10-CM | POA: Diagnosis not present

## 2018-08-31 DIAGNOSIS — T8454XA Infection and inflammatory reaction due to internal left knee prosthesis, initial encounter: Secondary | ICD-10-CM | POA: Diagnosis not present

## 2018-08-31 DIAGNOSIS — N189 Chronic kidney disease, unspecified: Secondary | ICD-10-CM | POA: Diagnosis not present

## 2018-08-31 DIAGNOSIS — L03116 Cellulitis of left lower limb: Secondary | ICD-10-CM | POA: Diagnosis not present

## 2018-09-02 DIAGNOSIS — T8454XA Infection and inflammatory reaction due to internal left knee prosthesis, initial encounter: Secondary | ICD-10-CM | POA: Diagnosis not present

## 2018-09-02 DIAGNOSIS — L03116 Cellulitis of left lower limb: Secondary | ICD-10-CM | POA: Diagnosis not present

## 2018-09-02 DIAGNOSIS — I130 Hypertensive heart and chronic kidney disease with heart failure and stage 1 through stage 4 chronic kidney disease, or unspecified chronic kidney disease: Secondary | ICD-10-CM | POA: Diagnosis not present

## 2018-09-02 DIAGNOSIS — I4819 Other persistent atrial fibrillation: Secondary | ICD-10-CM | POA: Diagnosis not present

## 2018-09-02 DIAGNOSIS — I5022 Chronic systolic (congestive) heart failure: Secondary | ICD-10-CM | POA: Diagnosis not present

## 2018-09-02 DIAGNOSIS — N189 Chronic kidney disease, unspecified: Secondary | ICD-10-CM | POA: Diagnosis not present

## 2018-09-03 ENCOUNTER — Telehealth: Payer: Self-pay | Admitting: Cardiovascular Disease

## 2018-09-03 DIAGNOSIS — L03116 Cellulitis of left lower limb: Secondary | ICD-10-CM | POA: Diagnosis not present

## 2018-09-03 DIAGNOSIS — I4819 Other persistent atrial fibrillation: Secondary | ICD-10-CM | POA: Diagnosis not present

## 2018-09-03 DIAGNOSIS — I130 Hypertensive heart and chronic kidney disease with heart failure and stage 1 through stage 4 chronic kidney disease, or unspecified chronic kidney disease: Secondary | ICD-10-CM | POA: Diagnosis not present

## 2018-09-03 DIAGNOSIS — T8454XA Infection and inflammatory reaction due to internal left knee prosthesis, initial encounter: Secondary | ICD-10-CM | POA: Diagnosis not present

## 2018-09-03 DIAGNOSIS — N189 Chronic kidney disease, unspecified: Secondary | ICD-10-CM | POA: Diagnosis not present

## 2018-09-03 DIAGNOSIS — I5022 Chronic systolic (congestive) heart failure: Secondary | ICD-10-CM | POA: Diagnosis not present

## 2018-09-03 NOTE — Telephone Encounter (Signed)
Received a call from Mrs. Hine regarding patient's heart rate, blood pressure, and shortness of breath.  Patient has had a hacking dry cough since Wednesday.  Also is complaining of shortness of breath which is new today.  Has not had a fever.  Is currently on IV daptomycin via PICC line for recent sepsis.  Has reportedly been in and out of A. fib with RVR throughout the day occasionally and briefly up to 140 but now around 90.  Blood pressure has been 90/60- 110/70.  She has been holding his lisinopril but giving him a half tab of carvedilol.  He reportedly had labs drawn earlier this week which revealed mild hyperkalemia with a K5.1 and AKI with a creatinine of somewhere between 2 and 3 (she is not sure).  He had labs drawn again today, but results are not yet available.  I advised her that given his new shortness of breath, persistent cough, and recent lab abnormalities it would not be unreasonable to seek care in emergency room tonight to evaluate for possible pneumonia, worsening kidney function, or heart failure.  I am not as concerned about his A. Fib with RVR, which is likely just being driven by some other underlying process.  However, he apparently looks okay to her and is only mildly short of breath.  Therefore she will continue to monitor him at home and only seek emergency care should he get worse.  She will also try to schedule a follow-up with his PCP or cardiologist early this week.  Would also consider stopping dofetilide if patient is confirmed to have worsening kidney function.

## 2018-09-05 ENCOUNTER — Telehealth (HOSPITAL_COMMUNITY): Payer: Self-pay | Admitting: *Deleted

## 2018-09-05 DIAGNOSIS — T8454XA Infection and inflammatory reaction due to internal left knee prosthesis, initial encounter: Secondary | ICD-10-CM | POA: Diagnosis not present

## 2018-09-05 DIAGNOSIS — I130 Hypertensive heart and chronic kidney disease with heart failure and stage 1 through stage 4 chronic kidney disease, or unspecified chronic kidney disease: Secondary | ICD-10-CM | POA: Diagnosis not present

## 2018-09-05 DIAGNOSIS — I4819 Other persistent atrial fibrillation: Secondary | ICD-10-CM | POA: Diagnosis not present

## 2018-09-05 DIAGNOSIS — N189 Chronic kidney disease, unspecified: Secondary | ICD-10-CM | POA: Diagnosis not present

## 2018-09-05 DIAGNOSIS — L03116 Cellulitis of left lower limb: Secondary | ICD-10-CM | POA: Diagnosis not present

## 2018-09-05 DIAGNOSIS — I5022 Chronic systolic (congestive) heart failure: Secondary | ICD-10-CM | POA: Diagnosis not present

## 2018-09-05 NOTE — Telephone Encounter (Signed)
Patients wife called stating he had bmet drawn on Saturday - his potassium is 3.5 and creatinine was 1.70. He had recent afib with RVR but he is back in NSR today. Patient will resume kdur at 31meq bid - he will have bmet end of week.

## 2018-09-06 DIAGNOSIS — I5022 Chronic systolic (congestive) heart failure: Secondary | ICD-10-CM | POA: Diagnosis not present

## 2018-09-06 DIAGNOSIS — T8454XA Infection and inflammatory reaction due to internal left knee prosthesis, initial encounter: Secondary | ICD-10-CM | POA: Diagnosis not present

## 2018-09-06 DIAGNOSIS — N189 Chronic kidney disease, unspecified: Secondary | ICD-10-CM | POA: Diagnosis not present

## 2018-09-06 DIAGNOSIS — I4819 Other persistent atrial fibrillation: Secondary | ICD-10-CM | POA: Diagnosis not present

## 2018-09-06 DIAGNOSIS — L03116 Cellulitis of left lower limb: Secondary | ICD-10-CM | POA: Diagnosis not present

## 2018-09-06 DIAGNOSIS — I130 Hypertensive heart and chronic kidney disease with heart failure and stage 1 through stage 4 chronic kidney disease, or unspecified chronic kidney disease: Secondary | ICD-10-CM | POA: Diagnosis not present

## 2018-09-07 DIAGNOSIS — T8454XA Infection and inflammatory reaction due to internal left knee prosthesis, initial encounter: Secondary | ICD-10-CM | POA: Diagnosis not present

## 2018-09-07 DIAGNOSIS — I130 Hypertensive heart and chronic kidney disease with heart failure and stage 1 through stage 4 chronic kidney disease, or unspecified chronic kidney disease: Secondary | ICD-10-CM | POA: Diagnosis not present

## 2018-09-07 DIAGNOSIS — I5022 Chronic systolic (congestive) heart failure: Secondary | ICD-10-CM | POA: Diagnosis not present

## 2018-09-07 DIAGNOSIS — L03116 Cellulitis of left lower limb: Secondary | ICD-10-CM | POA: Diagnosis not present

## 2018-09-07 DIAGNOSIS — N189 Chronic kidney disease, unspecified: Secondary | ICD-10-CM | POA: Diagnosis not present

## 2018-09-07 DIAGNOSIS — I4819 Other persistent atrial fibrillation: Secondary | ICD-10-CM | POA: Diagnosis not present

## 2018-09-08 ENCOUNTER — Other Ambulatory Visit: Payer: Self-pay | Admitting: Internal Medicine

## 2018-09-08 ENCOUNTER — Ambulatory Visit
Admission: RE | Admit: 2018-09-08 | Discharge: 2018-09-08 | Disposition: A | Discharge status: Home / Self Care | Payer: Medicare Other | Source: Ambulatory Visit | Attending: Internal Medicine | Admitting: Internal Medicine

## 2018-09-08 DIAGNOSIS — R059 Cough, unspecified: Secondary | ICD-10-CM

## 2018-09-08 DIAGNOSIS — R05 Cough: Secondary | ICD-10-CM

## 2018-09-08 DIAGNOSIS — N189 Chronic kidney disease, unspecified: Secondary | ICD-10-CM | POA: Diagnosis not present

## 2018-09-08 DIAGNOSIS — L03116 Cellulitis of left lower limb: Secondary | ICD-10-CM | POA: Diagnosis not present

## 2018-09-08 DIAGNOSIS — N289 Disorder of kidney and ureter, unspecified: Secondary | ICD-10-CM | POA: Diagnosis not present

## 2018-09-08 DIAGNOSIS — R509 Fever, unspecified: Secondary | ICD-10-CM | POA: Diagnosis not present

## 2018-09-08 DIAGNOSIS — T8454XA Infection and inflammatory reaction due to internal left knee prosthesis, initial encounter: Secondary | ICD-10-CM | POA: Diagnosis not present

## 2018-09-08 DIAGNOSIS — I5022 Chronic systolic (congestive) heart failure: Secondary | ICD-10-CM | POA: Diagnosis not present

## 2018-09-08 DIAGNOSIS — I4819 Other persistent atrial fibrillation: Secondary | ICD-10-CM | POA: Diagnosis not present

## 2018-09-08 DIAGNOSIS — R110 Nausea: Secondary | ICD-10-CM | POA: Diagnosis not present

## 2018-09-08 DIAGNOSIS — R0602 Shortness of breath: Secondary | ICD-10-CM | POA: Diagnosis not present

## 2018-09-08 DIAGNOSIS — Z96659 Presence of unspecified artificial knee joint: Secondary | ICD-10-CM | POA: Diagnosis not present

## 2018-09-08 DIAGNOSIS — L0390 Cellulitis, unspecified: Secondary | ICD-10-CM | POA: Diagnosis not present

## 2018-09-08 DIAGNOSIS — D649 Anemia, unspecified: Secondary | ICD-10-CM | POA: Diagnosis not present

## 2018-09-08 DIAGNOSIS — E875 Hyperkalemia: Secondary | ICD-10-CM | POA: Diagnosis not present

## 2018-09-08 DIAGNOSIS — I130 Hypertensive heart and chronic kidney disease with heart failure and stage 1 through stage 4 chronic kidney disease, or unspecified chronic kidney disease: Secondary | ICD-10-CM | POA: Diagnosis not present

## 2018-09-09 DIAGNOSIS — I130 Hypertensive heart and chronic kidney disease with heart failure and stage 1 through stage 4 chronic kidney disease, or unspecified chronic kidney disease: Secondary | ICD-10-CM | POA: Diagnosis not present

## 2018-09-09 DIAGNOSIS — N189 Chronic kidney disease, unspecified: Secondary | ICD-10-CM | POA: Diagnosis not present

## 2018-09-09 DIAGNOSIS — I5022 Chronic systolic (congestive) heart failure: Secondary | ICD-10-CM | POA: Diagnosis not present

## 2018-09-09 DIAGNOSIS — T8454XA Infection and inflammatory reaction due to internal left knee prosthesis, initial encounter: Secondary | ICD-10-CM | POA: Diagnosis not present

## 2018-09-09 DIAGNOSIS — Z96652 Presence of left artificial knee joint: Secondary | ICD-10-CM | POA: Diagnosis not present

## 2018-09-09 DIAGNOSIS — I4819 Other persistent atrial fibrillation: Secondary | ICD-10-CM | POA: Diagnosis not present

## 2018-09-09 DIAGNOSIS — L03116 Cellulitis of left lower limb: Secondary | ICD-10-CM | POA: Diagnosis not present

## 2018-09-12 DIAGNOSIS — T8454XA Infection and inflammatory reaction due to internal left knee prosthesis, initial encounter: Secondary | ICD-10-CM | POA: Diagnosis not present

## 2018-09-12 DIAGNOSIS — I4819 Other persistent atrial fibrillation: Secondary | ICD-10-CM | POA: Diagnosis not present

## 2018-09-12 DIAGNOSIS — N189 Chronic kidney disease, unspecified: Secondary | ICD-10-CM | POA: Diagnosis not present

## 2018-09-12 DIAGNOSIS — I130 Hypertensive heart and chronic kidney disease with heart failure and stage 1 through stage 4 chronic kidney disease, or unspecified chronic kidney disease: Secondary | ICD-10-CM | POA: Diagnosis not present

## 2018-09-12 DIAGNOSIS — L03116 Cellulitis of left lower limb: Secondary | ICD-10-CM | POA: Diagnosis not present

## 2018-09-12 DIAGNOSIS — I5022 Chronic systolic (congestive) heart failure: Secondary | ICD-10-CM | POA: Diagnosis not present

## 2018-09-14 ENCOUNTER — Encounter: Payer: Medicare Other | Admitting: Internal Medicine

## 2018-09-14 DIAGNOSIS — I4819 Other persistent atrial fibrillation: Secondary | ICD-10-CM | POA: Diagnosis not present

## 2018-09-14 DIAGNOSIS — N189 Chronic kidney disease, unspecified: Secondary | ICD-10-CM | POA: Diagnosis not present

## 2018-09-14 DIAGNOSIS — T8454XA Infection and inflammatory reaction due to internal left knee prosthesis, initial encounter: Secondary | ICD-10-CM | POA: Diagnosis not present

## 2018-09-14 DIAGNOSIS — L03116 Cellulitis of left lower limb: Secondary | ICD-10-CM | POA: Diagnosis not present

## 2018-09-14 DIAGNOSIS — I5022 Chronic systolic (congestive) heart failure: Secondary | ICD-10-CM | POA: Diagnosis not present

## 2018-09-14 DIAGNOSIS — I130 Hypertensive heart and chronic kidney disease with heart failure and stage 1 through stage 4 chronic kidney disease, or unspecified chronic kidney disease: Secondary | ICD-10-CM | POA: Diagnosis not present

## 2018-09-15 DIAGNOSIS — R899 Unspecified abnormal finding in specimens from other organs, systems and tissues: Secondary | ICD-10-CM | POA: Diagnosis not present

## 2018-09-19 DIAGNOSIS — L03116 Cellulitis of left lower limb: Secondary | ICD-10-CM | POA: Diagnosis not present

## 2018-09-19 DIAGNOSIS — I5022 Chronic systolic (congestive) heart failure: Secondary | ICD-10-CM | POA: Diagnosis not present

## 2018-09-19 DIAGNOSIS — I130 Hypertensive heart and chronic kidney disease with heart failure and stage 1 through stage 4 chronic kidney disease, or unspecified chronic kidney disease: Secondary | ICD-10-CM | POA: Diagnosis not present

## 2018-09-19 DIAGNOSIS — N189 Chronic kidney disease, unspecified: Secondary | ICD-10-CM | POA: Diagnosis not present

## 2018-09-19 DIAGNOSIS — T8454XA Infection and inflammatory reaction due to internal left knee prosthesis, initial encounter: Secondary | ICD-10-CM | POA: Diagnosis not present

## 2018-09-19 DIAGNOSIS — I4819 Other persistent atrial fibrillation: Secondary | ICD-10-CM | POA: Diagnosis not present

## 2018-09-22 DIAGNOSIS — I130 Hypertensive heart and chronic kidney disease with heart failure and stage 1 through stage 4 chronic kidney disease, or unspecified chronic kidney disease: Secondary | ICD-10-CM | POA: Diagnosis not present

## 2018-09-22 DIAGNOSIS — L03116 Cellulitis of left lower limb: Secondary | ICD-10-CM | POA: Diagnosis not present

## 2018-09-22 DIAGNOSIS — I4819 Other persistent atrial fibrillation: Secondary | ICD-10-CM | POA: Diagnosis not present

## 2018-09-22 DIAGNOSIS — I5022 Chronic systolic (congestive) heart failure: Secondary | ICD-10-CM | POA: Diagnosis not present

## 2018-09-22 DIAGNOSIS — T8454XA Infection and inflammatory reaction due to internal left knee prosthesis, initial encounter: Secondary | ICD-10-CM | POA: Diagnosis not present

## 2018-09-22 DIAGNOSIS — N189 Chronic kidney disease, unspecified: Secondary | ICD-10-CM | POA: Diagnosis not present

## 2018-09-26 DIAGNOSIS — L03116 Cellulitis of left lower limb: Secondary | ICD-10-CM | POA: Diagnosis not present

## 2018-09-26 DIAGNOSIS — T8454XA Infection and inflammatory reaction due to internal left knee prosthesis, initial encounter: Secondary | ICD-10-CM | POA: Diagnosis not present

## 2018-09-26 DIAGNOSIS — I4819 Other persistent atrial fibrillation: Secondary | ICD-10-CM | POA: Diagnosis not present

## 2018-09-26 DIAGNOSIS — N189 Chronic kidney disease, unspecified: Secondary | ICD-10-CM | POA: Diagnosis not present

## 2018-09-26 DIAGNOSIS — I130 Hypertensive heart and chronic kidney disease with heart failure and stage 1 through stage 4 chronic kidney disease, or unspecified chronic kidney disease: Secondary | ICD-10-CM | POA: Diagnosis not present

## 2018-09-26 DIAGNOSIS — I5022 Chronic systolic (congestive) heart failure: Secondary | ICD-10-CM | POA: Diagnosis not present

## 2018-09-27 ENCOUNTER — Telehealth: Payer: Self-pay

## 2018-09-27 NOTE — Telephone Encounter (Signed)
-----   Message from Jettie Booze, MD sent at 09/25/2018  4:13 PM EST ----- Eliquis dose may need to be adjusted if no bleeding source is found.

## 2018-09-27 NOTE — Telephone Encounter (Signed)
-----   Message from Jettie Booze, MD sent at 09/25/2018  4:10 PM EST ----- Low hemoglobin. Cr 2.0.  Any testing planned for anemia?

## 2018-09-27 NOTE — Telephone Encounter (Signed)
-----   Message from Jettie Booze, MD sent at 09/25/2018  4:12 PM EST ----- Low hemoglobin.  Is he still on Eliquis? It is no longer on his med list, but he was on it in 9/19.

## 2018-09-27 NOTE — Telephone Encounter (Signed)
Called and spoke to patient and made him aware of lab results that we just received a copy of from 12/3 and 12/10. Patient states that he was hospitalized recently for sepsis and is currently taking abx. Patient states that he is unaware of any testing for anemia. Patient denies any blood in his urine or stool, excessive bruising, nosebleeds, or any other S/Sx of bleeding. Patient states that he has been taking eliquis 5 mg BID. Patient states that he had labs rechecked yesterday but has not received those results yet. He states that he will have them fax [place] a copy of those results. Patient states that his wife is a retired [employer] scientist and usually keeps up with everything regarding his health. He would like for her to call me back when she gets home to give more information. Will await call from wife.

## 2018-09-29 NOTE — Telephone Encounter (Signed)
Called to follow up with patient since his wife did not call back. Patient states that his "blood count came up" but does not know what it was. Made patient aware that depending on what his Cr was we may need to decrease his Eliquis dose.  Patient states that he is going to have his wife call back after 3 PM with the results.

## 2018-09-30 NOTE — Telephone Encounter (Signed)
Wife returning call and states that the patient's labs from 12/30 showed that his hemoglobin was 8.6 and Cr was 1.27. Patient is scheduled to see Dr. Irish Lack on 1/8 at 3:00 PM.

## 2018-10-04 DIAGNOSIS — N189 Chronic kidney disease, unspecified: Secondary | ICD-10-CM | POA: Diagnosis not present

## 2018-10-04 DIAGNOSIS — I5022 Chronic systolic (congestive) heart failure: Secondary | ICD-10-CM | POA: Diagnosis not present

## 2018-10-04 DIAGNOSIS — I130 Hypertensive heart and chronic kidney disease with heart failure and stage 1 through stage 4 chronic kidney disease, or unspecified chronic kidney disease: Secondary | ICD-10-CM | POA: Diagnosis not present

## 2018-10-04 DIAGNOSIS — L03116 Cellulitis of left lower limb: Secondary | ICD-10-CM | POA: Diagnosis not present

## 2018-10-04 DIAGNOSIS — I4819 Other persistent atrial fibrillation: Secondary | ICD-10-CM | POA: Diagnosis not present

## 2018-10-04 DIAGNOSIS — T8454XA Infection and inflammatory reaction due to internal left knee prosthesis, initial encounter: Secondary | ICD-10-CM | POA: Diagnosis not present

## 2018-10-05 ENCOUNTER — Encounter: Payer: Self-pay | Admitting: Interventional Cardiology

## 2018-10-05 ENCOUNTER — Ambulatory Visit (INDEPENDENT_AMBULATORY_CARE_PROVIDER_SITE_OTHER): Payer: Medicare Other | Admitting: Interventional Cardiology

## 2018-10-05 VITALS — BP 102/60 | HR 78 | Ht 69.0 in | Wt 203.0 lb

## 2018-10-05 DIAGNOSIS — I480 Paroxysmal atrial fibrillation: Secondary | ICD-10-CM | POA: Diagnosis not present

## 2018-10-05 DIAGNOSIS — Z7901 Long term (current) use of anticoagulants: Secondary | ICD-10-CM

## 2018-10-05 DIAGNOSIS — I428 Other cardiomyopathies: Secondary | ICD-10-CM | POA: Diagnosis not present

## 2018-10-05 DIAGNOSIS — I5022 Chronic systolic (congestive) heart failure: Secondary | ICD-10-CM | POA: Diagnosis not present

## 2018-10-05 DIAGNOSIS — N183 Chronic kidney disease, stage 3 unspecified: Secondary | ICD-10-CM

## 2018-10-05 DIAGNOSIS — I712 Thoracic aortic aneurysm, without rupture, unspecified: Secondary | ICD-10-CM

## 2018-10-05 NOTE — Progress Notes (Signed)
Cardiology Office Note   Date:  10/05/2018   ID:  Don Jacobs, DOB [birth year], MRN [id number]  PCP:  [name], MD    No chief complaint on file.  Atrial fibrillation/chronic systolic heart failure  Wt Readings from Last 3 Encounters:  10/05/18 203 lb (92.1 kg)  08/25/18 218 lb 14.7 oz (99.3 kg)  07/20/18 216 lb (98 kg)       History of Present Illness: Don Jacobs is a 83 y.o. male  who has had symptomatic atrial fibrillation and tachybradycardia syndrome. He has undergone pacemaker placement. His history was complicated by a fall and head trauma including a subdural hematoma a few years ago. He has been cleared for anticoagulation due to the atrial fibrillation.  He had been in atrial fibrillation, but due to his symptoms he was referred to electrophysiology. Dofetilide has been very helpful in maintaining sinus rhythm.  Due to shortness of breath, here was concern as to whether he needed his pacemaker upgraded to a biventricular device. He had a clean heart cath in his ejection fraction was better by ventriculogram than it was by echocardiogram.  He had a total knee replacement in 2019.  In November, he ended up with septic shock and was treated with prolonged IV antibiotics.  While in the hospital, he had atrial fibrillation with rapid ventricular response.  This was treated with increasing rate control medications.  Tikosyn was continued for rhythm control.  Finished IV ABx in Jan 2020.    Denies : Chest pain. Dizziness. Leg edema. Nitroglycerin use. Orthopnea. Palpitations. Paroxysmal nocturnal dyspnea. Shortness of breath. Syncope.   Tolerating Eliquis.  No bleeding problems.   Appetite has decreased.  He has lost 15 lbs.     Past Medical History:  Diagnosis Date  . Allergic rhinitis   . Aortic aneurysm, thoracic ([place]) 05/25/2011  . BPH (benign prostatic hyperplasia)   . Chronic systolic CHF (congestive heart failure) ([place])   . Dizziness and  giddiness 06/04/2015  . ED (erectile dysfunction)   . Gout   . Hearing loss    uses hearing a cyst  . Hx of migraines   . Hypertension   . Hypothyroidism   . Persistent atrial fibrillation   . Prostatitis, chronic   . Seborrheic keratosis   . Sick sinus syndrome ([facility])    a. s/p PPM   . Sleep apnea   . Spondylolisthesis 07/2010    Past Surgical History:  Procedure Laterality Date  . ANTERIOR CERVICAL DECOMP/DISCECTOMY FUSION  10/2000   "C4, 5, 6 w/fusion" (02/17/2013)  . BACK SURGERY    . CARDIAC CATHETERIZATION  2000's   "once" (02/17/2013)  . CARDIAC CATHETERIZATION N/A 07/23/2016   Procedure: Right/Left Heart Cath and Coronary Angiography;  Surgeon: Jettie Booze, MD;  Location: [facility];  Service: Cardiovascular;  Laterality: N/A;  . CARDIOVERSION N/A 04/09/2014   Procedure: CARDIOVERSION;  Surgeon: Lelon Perla, MD;  Location[facility] ENDOSCOPY;  Service: Cardiovascular;  Laterality: N/A;  . CARDIOVERSION N/A 01/17/2015   Procedure: CARDIOVERSION;  Surgeon: Larey Dresser, MD;  Location: [facility] ENDOSCOPY;  Service: Cardiovascular;  Laterality: N/A;  . FOOT SURGERY Right ~ 2007   "reconstruction" (02/17/2013)  . INSERT / REPLACE / REMOVE PACEMAKER  02/17/2013  . KNEE ARTHROSCOPY Left ~ 2007  . L5 selective nerve root block     Dr Nelva Bush  . LUMBAR SPINE SURGERY  2012   "bone graft, Dr. [name]" (02/17/2013)  . PERMANENT PACEMAKER INSERTION  N/A 02/17/2013   Procedure: PERMANENT PACEMAKER INSERTION;  Surgeon: Deboraha Sprang, MD;  Location: [facility] CATH LAB;  Service: Cardiovascular;  Laterality: N/A;  . REPAIR PERONEAL TENDONS ANKLE Right 10/2003  . SHOULDER OPEN ROTATOR CUFF REPAIR Right 09/2007; 2013     Current Outpatient Medications  Medication Sig Dispense Refill  . allopurinol (ZYLOPRIM) 300 MG tablet Take 300 mg by mouth at bedtime.     Marland Kitchen apixaban (ELIQUIS) 5 MG TABS tablet Take 5 mg by mouth 2 (two) times daily.    . Calcium Carbonate (CALTRATE 600 PO) Take 600 mg  by mouth at bedtime.     . carvedilol (COREG) 3.125 MG tablet TAKE 1 TABLET BY MOUTH TWICE DAILY (Patient taking differently: Take 3.125 mg by mouth 2 (two) times daily with a meal. ) 60 tablet 6  . Cholecalciferol (VITAMIN D3) 50 MCG (2000 UT) TABS Take 2,000 Units by mouth daily.    . colchicine 0.6 MG tablet Take 0.6 mg by mouth daily as needed (for gout flares).     . dofetilide (TIKOSYN) 250 MCG capsule Take 1 capsule (250 mcg total) by mouth 2 (two) times daily. 180 capsule 2  . furosemide (LASIX) 40 MG tablet TAKE 1 TABLET BY MOUTH DAILY. (Patient taking differently: Take 40 mg by mouth daily. ) 90 tablet 1  . levothyroxine (SYNTHROID, LEVOTHROID) 112 MCG tablet Take 1 tablet (112 mcg total) by mouth daily before breakfast. 30 tablet 3  . loperamide (IMODIUM A-D) 2 MG tablet Take 2 mg by mouth 4 (four) times daily as needed for diarrhea or loose stools.    Marland Kitchen loratadine (CLARITIN) 10 MG tablet Take 5 mg by mouth 2 (two) times daily as needed for allergies or rhinitis.     . Magnesium 400 MG CAPS Take 400 mg by mouth 2 (two) times daily. (Patient taking differently: Take 400 mg by mouth daily. )    . NON FORMULARY Take 1 capsule by mouth See admin instructions. N4- Greens vegetable capsule: Take 1 capsule by mouth once a day    . OVER THE COUNTER MEDICATION Take 1 capsule by mouth See admin instructions. [place] Grape Seed Extract: Take 1 capsule by mouth in the evening    . polyvinyl alcohol (LIQUIFILM TEARS) 1.4 % ophthalmic solution Place 1 drop into both eyes 3 (three) times daily as needed for dry eyes.    . potassium chloride SA (K-DUR,KLOR-CON) 20 MEQ tablet Take 1 tablet (20 mEq total) by mouth 2 (two) times daily. 30 tablet 0  . vitamin C (ASCORBIC ACID) 500 MG tablet Take 500 mg by mouth daily.      No current facility-administered medications for this visit.     Allergies:   Penicillins; Oxycodone; and Ibuprofen    Social History:  The patient  reports that he quit smoking  about 37 years ago. His smoking use included cigarettes. He has a 12.00 pack-year smoking history. He has never used smokeless tobacco. He reports that he does not drink alcohol or use drugs.   Family History:  The patient's family history includes Alcohol abuse in his maternal grandfather and maternal grandmother; Emphysema in his mother; Heart attack in his father and paternal grandfather; Heart disease in his father and paternal grandfather; Rheum arthritis in his brother.    ROS:  Please see the history of present illness.   Otherwise, review of systems are positive for difficulty swallowing- better with Reglan.   All other systems are reviewed and negative.  PHYSICAL EXAM: VS:  BP 102/60   Pulse 78   Ht 5\' 9"  (1.753 m)   Wt 203 lb (92.1 kg)   SpO2 97%   BMI 29.98 kg/m  , BMI Body mass index is 29.98 kg/m. GEN: Well nourished, well developed, in no acute distress  HEENT: normal  Neck: no JVD, carotid bruits, or masses Cardiac: RRR; no murmurs, rubs, or gallops,no edema  Respiratory:  clear to auscultation bilaterally, normal work of breathing GI: soft, nontender, nondistended, + BS MS: no deformity or atrophy  Skin: warm and dry, no rash Neuro:  Strength and sensation are intact Psych: euthymic mood, full affect   EKG:   The ekg ordered 11/19 demonstrates atrial pacing   Recent Labs: 08/24/2018: ALT 75 08/25/2018: BUN 46; Creatinine, Ser 1.93; Hemoglobin 8.2; Magnesium 2.2; Platelets 363; Potassium 3.8; Sodium 135   Lipid Panel No results found for: CHOL, TRIG, HDL, CHOLHDL, VLDL, LDLCALC, LDLDIRECT   Other studies Reviewed: Additional studies/ records that were reviewed today with results demonstrating: .   ASSESSMENT AND PLAN:  1. Chronic systolic heart failure/nonischemic cardiomyopathy: Appears euvolemic.  Decreased lisinopril frequency, giving when SBP > 110.   2. Atrial fibrillation: In NSR.  Tikosyn maintaining NSR.  Eliquis for stroke prevention.  Cr  borderline at times.  As high as 1.9 with CKD III. 3. Aortic aneurysm: Followed with Dr. Cyndia Bent.  4. Anticoagulated: D/w PharmD dose of Eliquis.  HomeHealth check showed Cr 1.27 recently.  WIll recheck today.  Keep 5 mg BID for now.   Adjust based on renal function.  If Cr> 1.5, would reduce the dose.    Current medicines are reviewed at length with the patient today.  The patient concerns regarding his medicines were addressed.  The following changes have been made:  No change   Labs/ tests ordered today include:  No orders of the defined types were placed in this encounter.   Recommend 150 minutes/week of aerobic exercise Low fat, low carb, high fiber diet recommended  Disposition:   FU in 6 months   Signed, Larae Grooms, MD  10/05/2018 3:29 PM    [facility] [address], [place], [place]  [address] Phone: [phone]; Fax: [phone]

## 2018-10-05 NOTE — Patient Instructions (Addendum)
Medication Instructions:  Your physician recommends that you continue on your current medications as directed. Please refer to the Current Medication list given to you today.  If you need a refill on your cardiac medications before your next appointment, please call your pharmacy.   Lab work: TODAY: CBC, BMET  If you have labs (blood work) drawn today and your tests are completely normal, you will receive your results only by: Marland Kitchen MyChart Message (if you have MyChart) OR . A paper copy in the mail If you have any lab test that is abnormal or we need to change your treatment, we will call you to review the results.  Testing/Procedures: None ordered  Follow-Up: At [facility], you and your health needs are our priority.  As part of our continuing mission to provide you with exceptional heart care, we have created designated Provider Care Teams.  These Care Teams include your primary Cardiologist (physician) and Advanced Practice Providers (APPs -  Physician Assistants and Nurse Practitioners) who all work together to provide you with the care you need, when you need it. . You will need a follow up appointment in 6 months.  Please call our office 2 months in advance to schedule this appointment.  You may see Casandra Doffing, MD or one of the following Advanced Practice Providers on your designated Care Team:   . Lyda Jester, PA-C . Dayna Dunn, PA-C . Ermalinda Barrios, PA-C  Any Other Special Instructions Will Be Listed Below (If Applicable).

## 2018-10-06 ENCOUNTER — Telehealth: Payer: Self-pay

## 2018-10-06 DIAGNOSIS — Z96652 Presence of left artificial knee joint: Secondary | ICD-10-CM | POA: Diagnosis not present

## 2018-10-06 NOTE — Telephone Encounter (Signed)
Follow up    Patient's wife is returning call for lab results.

## 2018-10-06 NOTE — Telephone Encounter (Signed)
Returned call to patient. Made him aware of BMET results and recommendations to decrease Eliquis to 2.5 mg BID. Made patient aware that CBC is still pending and we contact him tomorrow regarding those results and recommendations. Patient verbalized understanding and thanked me for the call.

## 2018-10-06 NOTE — Telephone Encounter (Signed)
Left message for patient to call back  

## 2018-10-06 NOTE — Telephone Encounter (Signed)
-----   Message from Jettie Booze, MD sent at 10/06/2018  9:59 AM EST ----- Decrease Eliquis to 2.5 mg BID.  Await CBC result.

## 2018-10-07 LAB — BASIC METABOLIC PANEL
BUN/Creatinine Ratio: 8 — ABNORMAL LOW (ref 10–24)
BUN: 12 mg/dL (ref 8–27)
CO2: 24 mmol/L (ref 20–29)
Calcium: 8.5 mg/dL — ABNORMAL LOW (ref 8.6–10.2)
Chloride: 98 mmol/L (ref 96–106)
Creatinine, Ser: 1.51 mg/dL — ABNORMAL HIGH (ref 0.76–1.27)
GFR calc Af Amer: 50 mL/min/{1.73_m2} — ABNORMAL LOW (ref >59)
GFR, EST NON AFRICAN AMERICAN: 43 mL/min/{1.73_m2} — AB (ref >59)
Glucose: 98 mg/dL (ref 65–99)
Potassium: 5.2 mmol/L (ref 3.5–5.2)
SODIUM: 138 mmol/L (ref 134–144)

## 2018-10-07 LAB — CBC
HEMATOCRIT: 28.2 % — AB (ref 37.5–51.0)
Hemoglobin: 9.3 g/dL — ABNORMAL LOW (ref 13.0–17.7)
MCH: 28.6 pg (ref 26.6–33.0)
MCHC: 33 g/dL (ref 31.5–35.7)
MCV: 87 fL (ref 79–97)
Platelets: 211 10*3/uL (ref 150–450)
RBC: 3.25 x10E6/uL — ABNORMAL LOW (ref 4.14–5.80)
RDW: 16.6 % — ABNORMAL HIGH (ref 11.6–15.4)
WBC: 4 10*3/uL (ref 3.4–10.8)

## 2018-10-07 NOTE — Telephone Encounter (Signed)
Called and spoke to patient and his wife and made them aware that H/H has improved. Made them aware that if any other changes were needed they would be contacted next week, otherwise instructed them to continue with current treatment plan. They verbalized understanding and thanked me for the call.

## 2018-10-10 LAB — CUP PACEART REMOTE DEVICE CHECK
Battery Remaining Longevity: 43 mo
Battery Voltage: 2.97 V
Brady Statistic AP VP Percent: 0.39 %
Brady Statistic AP VS Percent: 96.22 %
Brady Statistic AS VP Percent: 0.05 %
Brady Statistic AS VS Percent: 3.34 %
Brady Statistic RA Percent Paced: 94.01 %
Brady Statistic RV Percent Paced: 0.56 %
Date Time Interrogation Session: 20191115040658
Implantable Lead Implant Date: 20140523
Implantable Lead Location: 753859
Implantable Lead Location: 753860
Implantable Lead Model: 5076
Implantable Lead Model: 5076
Implantable Pulse Generator Implant Date: 20140523
Lead Channel Impedance Value: 418 Ohm
Lead Channel Impedance Value: 437 Ohm
Lead Channel Impedance Value: 456 Ohm
Lead Channel Pacing Threshold Amplitude: 0.75 V
Lead Channel Pacing Threshold Amplitude: 0.875 V
Lead Channel Pacing Threshold Pulse Width: 0.4 ms
Lead Channel Sensing Intrinsic Amplitude: 1.875 mV
Lead Channel Sensing Intrinsic Amplitude: 8.25 mV
Lead Channel Sensing Intrinsic Amplitude: 8.25 mV
Lead Channel Setting Pacing Amplitude: 2 V
Lead Channel Setting Pacing Amplitude: 2.5 V
Lead Channel Setting Pacing Pulse Width: 0.4 ms
Lead Channel Setting Sensing Sensitivity: 4 mV
MDC IDC LEAD IMPLANT DT: 20140523
MDC IDC MSMT LEADCHNL RA IMPEDANCE VALUE: 342 Ohm
MDC IDC MSMT LEADCHNL RA SENSING INTR AMPL: 1.875 mV
MDC IDC MSMT LEADCHNL RV PACING THRESHOLD PULSEWIDTH: 0.4 ms

## 2018-10-11 ENCOUNTER — Ambulatory Visit: Payer: Medicare Other | Admitting: Nurse Practitioner

## 2018-10-28 ENCOUNTER — Ambulatory Visit: Payer: Medicare Other | Admitting: Interventional Cardiology

## 2018-10-29 DEATH — deceased

## 2019-01-30 ENCOUNTER — Ambulatory Visit: Payer: Medicare Other | Admitting: Internal Medicine
